# Patient Record
Sex: Female | Born: 1965 | Race: White | Hispanic: No | Marital: Married | Smoking: Never smoker
Health system: Southern US, Community
[De-identification: ages and names within clinical notes are randomized; demographics above are authoritative.]

## PROBLEM LIST (undated history)

## (undated) DIAGNOSIS — Z8489 Family history of other specified conditions: Secondary | ICD-10-CM

## (undated) DIAGNOSIS — M341 CR(E)ST syndrome: Secondary | ICD-10-CM

## (undated) DIAGNOSIS — R0600 Dyspnea, unspecified: Secondary | ICD-10-CM

## (undated) DIAGNOSIS — E041 Nontoxic single thyroid nodule: Secondary | ICD-10-CM

## (undated) DIAGNOSIS — K5900 Constipation, unspecified: Secondary | ICD-10-CM

## (undated) DIAGNOSIS — R1310 Dysphagia, unspecified: Secondary | ICD-10-CM

## (undated) DIAGNOSIS — K219 Gastro-esophageal reflux disease without esophagitis: Secondary | ICD-10-CM

## (undated) DIAGNOSIS — K589 Irritable bowel syndrome without diarrhea: Secondary | ICD-10-CM

## (undated) DIAGNOSIS — M349 Systemic sclerosis, unspecified: Secondary | ICD-10-CM

## (undated) DIAGNOSIS — O24420 Gestational diabetes mellitus in childbirth, diet controlled: Secondary | ICD-10-CM

## (undated) DIAGNOSIS — M549 Dorsalgia, unspecified: Secondary | ICD-10-CM

## (undated) DIAGNOSIS — E559 Vitamin D deficiency, unspecified: Secondary | ICD-10-CM

## (undated) DIAGNOSIS — IMO0001 Reserved for inherently not codable concepts without codable children: Secondary | ICD-10-CM

## (undated) DIAGNOSIS — R600 Localized edema: Secondary | ICD-10-CM

## (undated) DIAGNOSIS — J8410 Pulmonary fibrosis, unspecified: Secondary | ICD-10-CM

## (undated) DIAGNOSIS — Z973 Presence of spectacles and contact lenses: Secondary | ICD-10-CM

## (undated) DIAGNOSIS — D649 Anemia, unspecified: Secondary | ICD-10-CM

## (undated) DIAGNOSIS — I2721 Secondary pulmonary arterial hypertension: Secondary | ICD-10-CM

## (undated) DIAGNOSIS — M1990 Unspecified osteoarthritis, unspecified site: Secondary | ICD-10-CM

## (undated) DIAGNOSIS — F419 Anxiety disorder, unspecified: Secondary | ICD-10-CM

## (undated) DIAGNOSIS — I7300 Raynaud's syndrome without gangrene: Secondary | ICD-10-CM

## (undated) DIAGNOSIS — G4730 Sleep apnea, unspecified: Secondary | ICD-10-CM

## (undated) DIAGNOSIS — K829 Disease of gallbladder, unspecified: Secondary | ICD-10-CM

## (undated) DIAGNOSIS — Z780 Asymptomatic menopausal state: Secondary | ICD-10-CM

## (undated) DIAGNOSIS — M2550 Pain in unspecified joint: Secondary | ICD-10-CM

## (undated) HISTORY — DX: Dyspnea, unspecified: R06.00

## (undated) HISTORY — PX: TUBAL LIGATION: SHX77

## (undated) HISTORY — DX: Dorsalgia, unspecified: M54.9

## (undated) HISTORY — DX: Nontoxic single thyroid nodule: E04.1

## (undated) HISTORY — DX: Constipation, unspecified: K59.00

## (undated) HISTORY — DX: Systemic sclerosis, unspecified: M34.9

## (undated) HISTORY — DX: Pain in unspecified joint: M25.50

## (undated) HISTORY — DX: Localized edema: R60.0

## (undated) HISTORY — DX: Cr(e)st syndrome: M34.1

## (undated) HISTORY — DX: Anxiety disorder, unspecified: F41.9

## (undated) HISTORY — DX: Secondary pulmonary arterial hypertension: I27.21

## (undated) HISTORY — DX: Vitamin D deficiency, unspecified: E55.9

## (undated) HISTORY — PX: COLONOSCOPY: SHX174

## (undated) HISTORY — DX: Disease of gallbladder, unspecified: K82.9

## (undated) HISTORY — DX: Dysphagia, unspecified: R13.10

## (undated) HISTORY — DX: Pulmonary fibrosis, unspecified: J84.10

## (undated) HISTORY — DX: Unspecified osteoarthritis, unspecified site: M19.90

## (undated) HISTORY — DX: Asymptomatic menopausal state: Z78.0

## (undated) HISTORY — DX: Irritable bowel syndrome, unspecified: K58.9

## (undated) HISTORY — DX: Sleep apnea, unspecified: G47.30

---

## 1985-12-24 DIAGNOSIS — O24420 Gestational diabetes mellitus in childbirth, diet controlled: Secondary | ICD-10-CM

## 1985-12-24 HISTORY — DX: Gestational diabetes mellitus in childbirth, diet controlled: O24.420

## 1994-12-24 HISTORY — PX: CHOLECYSTECTOMY: SHX55

## 1999-09-22 ENCOUNTER — Other Ambulatory Visit: Admission: RE | Admit: 1999-09-22 | Discharge: 1999-09-22 | Payer: Self-pay | Admitting: Obstetrics and Gynecology

## 2000-10-15 ENCOUNTER — Other Ambulatory Visit: Admission: RE | Admit: 2000-10-15 | Discharge: 2000-10-15 | Payer: Self-pay | Admitting: Obstetrics and Gynecology

## 2001-02-03 ENCOUNTER — Encounter: Admission: RE | Admit: 2001-02-03 | Discharge: 2001-02-03 | Payer: Self-pay | Admitting: Gastroenterology

## 2001-02-03 ENCOUNTER — Encounter: Payer: Self-pay | Admitting: Gastroenterology

## 2001-11-04 ENCOUNTER — Other Ambulatory Visit: Admission: RE | Admit: 2001-11-04 | Discharge: 2001-11-04 | Payer: Self-pay | Admitting: Obstetrics and Gynecology

## 2002-11-27 ENCOUNTER — Other Ambulatory Visit: Admission: RE | Admit: 2002-11-27 | Discharge: 2002-11-27 | Payer: Self-pay | Admitting: Obstetrics and Gynecology

## 2004-02-02 ENCOUNTER — Other Ambulatory Visit: Admission: RE | Admit: 2004-02-02 | Discharge: 2004-02-02 | Payer: Self-pay | Admitting: Obstetrics and Gynecology

## 2005-04-13 ENCOUNTER — Other Ambulatory Visit: Admission: RE | Admit: 2005-04-13 | Discharge: 2005-04-13 | Payer: Self-pay | Admitting: Obstetrics and Gynecology

## 2008-12-24 HISTORY — PX: VAGINAL HYSTERECTOMY: SUR661

## 2009-11-09 ENCOUNTER — Encounter (INDEPENDENT_AMBULATORY_CARE_PROVIDER_SITE_OTHER): Payer: Self-pay | Admitting: Obstetrics and Gynecology

## 2009-11-09 ENCOUNTER — Ambulatory Visit (HOSPITAL_COMMUNITY): Admission: RE | Admit: 2009-11-09 | Discharge: 2009-11-10 | Payer: Self-pay | Admitting: Obstetrics and Gynecology

## 2011-03-28 LAB — CBC
HCT: 32.8 % — ABNORMAL LOW (ref 36.0–46.0)
HCT: 38.8 % (ref 36.0–46.0)
Hemoglobin: 11.2 g/dL — ABNORMAL LOW (ref 12.0–15.0)
Hemoglobin: 13.2 g/dL (ref 12.0–15.0)
MCHC: 34 g/dL (ref 30.0–36.0)
MCHC: 34 g/dL (ref 30.0–36.0)
MCV: 86.7 fL (ref 78.0–100.0)
MCV: 87.1 fL (ref 78.0–100.0)
Platelets: 304 10*3/uL (ref 150–400)
Platelets: 308 10*3/uL (ref 150–400)
RBC: 3.77 MIL/uL — ABNORMAL LOW (ref 3.87–5.11)
RBC: 4.48 MIL/uL (ref 3.87–5.11)
RDW: 13 % (ref 11.5–15.5)
RDW: 13.1 % (ref 11.5–15.5)
WBC: 13.4 10*3/uL — ABNORMAL HIGH (ref 4.0–10.5)
WBC: 7.7 10*3/uL (ref 4.0–10.5)

## 2011-03-28 LAB — HCG, SERUM, QUALITATIVE: Preg, Serum: NEGATIVE

## 2014-07-25 ENCOUNTER — Emergency Department (HOSPITAL_BASED_OUTPATIENT_CLINIC_OR_DEPARTMENT_OTHER)
Admission: EM | Admit: 2014-07-25 | Discharge: 2014-07-25 | Disposition: A | Discharge status: Home / Self Care | Payer: BC Managed Care – PPO | Attending: Emergency Medicine | Admitting: Emergency Medicine

## 2014-07-25 ENCOUNTER — Encounter (HOSPITAL_BASED_OUTPATIENT_CLINIC_OR_DEPARTMENT_OTHER): Payer: Self-pay | Admitting: Emergency Medicine

## 2014-07-25 ENCOUNTER — Emergency Department (HOSPITAL_BASED_OUTPATIENT_CLINIC_OR_DEPARTMENT_OTHER): Payer: BC Managed Care – PPO

## 2014-07-25 DIAGNOSIS — Y929 Unspecified place or not applicable: Secondary | ICD-10-CM | POA: Insufficient documentation

## 2014-07-25 DIAGNOSIS — R296 Repeated falls: Secondary | ICD-10-CM | POA: Insufficient documentation

## 2014-07-25 DIAGNOSIS — Z79899 Other long term (current) drug therapy: Secondary | ICD-10-CM | POA: Insufficient documentation

## 2014-07-25 DIAGNOSIS — K219 Gastro-esophageal reflux disease without esophagitis: Secondary | ICD-10-CM | POA: Insufficient documentation

## 2014-07-25 DIAGNOSIS — S52123A Displaced fracture of head of unspecified radius, initial encounter for closed fracture: Secondary | ICD-10-CM | POA: Insufficient documentation

## 2014-07-25 DIAGNOSIS — S6990XA Unspecified injury of unspecified wrist, hand and finger(s), initial encounter: Secondary | ICD-10-CM | POA: Insufficient documentation

## 2014-07-25 DIAGNOSIS — S52121A Displaced fracture of head of right radius, initial encounter for closed fracture: Secondary | ICD-10-CM

## 2014-07-25 DIAGNOSIS — Y9389 Activity, other specified: Secondary | ICD-10-CM | POA: Insufficient documentation

## 2014-07-25 HISTORY — DX: Gastro-esophageal reflux disease without esophagitis: K21.9

## 2014-07-25 MED ORDER — IBUPROFEN 400 MG PO TABS
400.0000 mg | ORAL_TABLET | Freq: Once | ORAL | Status: AC
  Administered 2014-07-25: 400 mg via ORAL
  Filled 2014-07-25: qty 1

## 2014-07-25 MED ORDER — DOCUSATE SODIUM 100 MG PO CAPS: 100.0000 mg | ORAL_CAPSULE | Freq: Two times a day (BID) | ORAL | Status: DC | PRN

## 2014-07-25 MED ORDER — OXYCODONE-ACETAMINOPHEN 5-325 MG PO TABS
2.0000 | ORAL_TABLET | Freq: Once | ORAL | Status: AC
  Administered 2014-07-25: 2 via ORAL
  Filled 2014-07-25: qty 2

## 2014-07-25 MED ORDER — OXYCODONE-ACETAMINOPHEN 5-325 MG PO TABS: 1.0000 | ORAL_TABLET | ORAL | Status: DC | PRN

## 2014-07-25 NOTE — Discharge Instructions (Signed)
Radial Head Fracture °A radial head fracture is a break of the smaller bone (radius) in the forearm. The head of this bone is the part near the elbow. These fractures commonly happen during a fall, when you land on an outstretched arm. These fractures are more common in middle aged adults and are common with a dislocation of the elbow. °SYMPTOMS  °· Swelling of the elbow joint and pain on the outside of the elbow. °· Pain and difficulty in bending or straightening the elbow. °· Pain and difficulty in turning the palm of the hand up or down with the elbow bent. °DIAGNOSIS  °Your caregiver may make this diagnosis by a physical exam. X-rays can confirm the type and amount of fracture. Sometimes a fracture that is not displaced cannot be seen on the original X-ray. °TREATMENT  °Radial head fractures are classified according to the amount of movement (displacement) of parts from the normal position.  °Type 1 Fractures °· Type 1 fractures are generally small fractures in which bone pieces remain together (nondisplaced fracture). °· The fracture may not be seen on initial X-rays. Usually if X-rays are repeated two to three weeks later, the fracture will show up. A splint or sling is used for a few days. Gentle early motion is used to prevent the elbow from becoming stiff. It should not be done vigorously or forced as this could displace the bone pieces. °Type 2 Fractures °· With type 2 fractures, bone pieces are slightly displaced and larger pieces of bone are broken off. °· If only a little displacement of the bone piece is present, splinting for 4 to 5 days usually works well. This is again followed with gentle active range of motion. Small fragments may be surgically removed. °· Large pieces of bone that can be put back into place will sometimes be fixed with pins or screws to hold them until the bone is healed. If this cannot be done, the fragments are removed. For older, less active people, sometimes the entire radial  head is removed if the wrist is not injured. The elbow and arm will still work fine. Soft tissue, tendon, and ligament injuries are corrected at the same time. °Type 3 Fractures °· Type 3 fractures have multiple broken pieces of bone that cannot be fixed. Surgery is usually needed to remove the broken bits of bone and what is left of the radial head. Soft-tissue damage is repaired. Gentle early motion is used to prevent the elbow from becoming stiff. Sometimes an artificial radial head can be used to prevent deformity if the elbow is unstable. °Rest, ice, elevation, immobilization, medications, and pain control are used in the early care. °HOME CARE INSTRUCTIONS  °· Keep the injured part elevated while sitting or lying down. Keep the injury above the level of your heart (the center of the chest). This will decrease swelling and pain. °· Apply ice to the injury for 15-20 minutes, 03-04 times per day while awake, for 2 days. Put the ice in a plastic bag and place a towel between the bag of ice and your cast or splint. °· Move your fingers to avoid stiffness and minimize swelling. °· If you have a plaster or fiberglass cast: °¨ Do not try to scratch the skin under the cast using sharp or pointed objects. °¨ Check the skin around the cast every day. You may put lotion on any red or sore areas. °¨ Keep your cast dry and clean. °· If you have a plaster splint: °¨   Wear the splint as directed. °¨ You may loosen the elastic around the splint if your fingers become numb, tingle, or turn cold or blue. °· Do not put pressure on any part of your cast or splint. It may break. Rest your cast only on a pillow for the first 24 hours until it is fully hardened. °· Your cast or splint can be protected during bathing with a plastic bag. Do not lower the cast or splint into the water. °· Only take over-the-counter or prescription medicines for pain, discomfort, or fever as directed by your caregiver. °· Follow all instructions for  follow-up with your caregiver. This includes any orthopedic referrals, physical therapy, and rehabilitation. Any delay in obtaining necessary care could result in a delay or failure of the bones to heal or permanent elbow stiffness. °· Do not overdo exercises. This could further damage your injury. °SEEK IMMEDIATE MEDICAL CARE IF:  °· Your cast or splint gets damaged or breaks. °· You have more severe pain or swelling than you did before getting the cast. °· You have severe pain when stretching your fingers. °· There is a bad smell, new stains, and/or pus-like (purulent) drainage coming from under the cast. °· Your fingers or hand turn pale or blue, become cold, or you lose feeling. °Document Released: 10/01/2006 Document Revised: 04/26/2014 Document Reviewed: 11/08/2009 °[employer]® Patient Information ©2015 [employer]. This information is not intended to replace advice given to you by your health care provider. Make sure you discuss any questions you have with your health care provider. ° °

## 2014-07-25 NOTE — ED Notes (Signed)
Pt reports fell about 30 min pta, states she attempted to catch herself with palms and now having pain in right elbow down to hand.

## 2014-07-25 NOTE — ED Provider Notes (Signed)
CSN: [id number]     Arrival date & time 07/25/14  1315 History  This chart was scribed for Raeford RazorStephen Kalecia Hartney, MD by Phillis HaggisGabriella Gaje, ED Scribe. This patient was seen in room MH02/MH02 and patient care was started at 3:14 PM.     Chief Complaint  Patient presents with  . Arm Injury   Patient is a 48 y.o. female presenting with arm injury. The history is provided by the patient.  Arm Injury  HPI Comments: Rachael Camacho is a 48 y.o. female who presents to the Emergency Department complaining of right arm injury onset 30 minutes ago. She states that she attempted to catch herself while falling down some stairs, landing on her palms. She reports associated radiating pain from her right elbow down to her hand. She denies shoulder pain, numbness or tingling. Patient reports allergies to [name], [name], and Sulfa antibiotics. She denies any other symptoms.    Past Medical History  Diagnosis Date  . GERD (gastroesophageal reflux disease)    Past Surgical History  Procedure Laterality Date  . Cesarean section    . Abdominal hysterectomy    . Cholecystectomy     No family history on file. History  Substance Use Topics  . Smoking status: Never Smoker   . Smokeless tobacco: Not on file  . Alcohol Use: Yes     Comment: occ   OB History   Grav Para Term Preterm Abortions TAB SAB Ect Mult Living                 Review of Systems  Musculoskeletal: Positive for arthralgias.  Neurological: Negative for weakness and numbness.  All other systems reviewed and are negative.     Allergies  [name]; [name]; and Sulfa antibiotics  Home Medications   Prior to Admission medications   Medication Sig Start Date End Date Taking? Authorizing Provider  esomeprazole (NEXIUM) 40 MG capsule Take 40 mg by mouth daily at 12 noon.   Yes Historical Provider, MD   BP 154/99  Pulse 70  Temp(Src) 97.9 F (36.6 C) (Oral)  Resp 16  Ht 5\' 4"  (1.626 m)  Wt 260 lb (117.935 kg)  BMI 44.61 kg/m2   SpO2 99% Physical Exam  Nursing note and vitals reviewed. Constitutional: She appears well-developed and well-nourished. No distress.  HENT:  Head: Normocephalic and atraumatic.  Eyes: Conjunctivae are normal. Right eye exhibits no discharge. Left eye exhibits no discharge.  Neck: Neck supple.  Cardiovascular: Normal rate, regular rhythm and normal heart sounds.  Exam reveals no gallop and no friction rub.   No murmur heard. Pulmonary/Chest: Effort normal and breath sounds normal. No respiratory distress.  Abdominal: Soft. She exhibits no distension. There is no tenderness.  Musculoskeletal: She exhibits no edema and no tenderness.  Exam limited by body habitus. Tenderness to radial head, pain with supination and pronation of forearm. Cannot actively range elbow secondary to pain. Skin intact. NVI distally.   Neurological: She is alert.  Skin: Skin is warm and dry.  Psychiatric: She has a normal mood and affect. Her behavior is normal. Thought content normal.    ED Course  Procedures (including critical care time) DIAGNOSTIC STUDIES: Oxygen Saturation is 98% on room air, normal by my interpretation.    COORDINATION OF CARE:    Labs Review Labs Reviewed - No data to display  Imaging Review Dg Forearm Right  07/25/2014   CLINICAL DATA:  Fall, right arm pain  EXAM: RIGHT FOREARM - 2 VIEW  COMPARISON:  Right wrist radiographs dictated separately  FINDINGS: There is a displaced radial head fracture with overlying soft tissue swelling. No evidence for dislocation. No radiopaque foreign body.  IMPRESSION: Right radial head fracture.   Electronically Signed   By: Christiana Pellant M.D.   On: 07/25/2014 15:15   Dg Wrist Complete Right  07/25/2014   CLINICAL DATA:  Fall, right arm pain  EXAM: RIGHT WRIST - COMPLETE 3+ VIEW  COMPARISON:  Please see report of forearm radiographs dictated separately today  FINDINGS: There is no evidence of fracture or dislocation. There is no evidence of  arthropathy or other focal bone abnormality. Soft tissues are unremarkable.  IMPRESSION: Negative. Please see report of forearm radiographs dictated separately today.   Electronically Signed   By: Christiana Pellant M.D.   On: 07/25/2014 15:16     EKG Interpretation None      MDM   Final diagnoses:  Closed fracture of radial head, right, initial encounter    48yF with R elbow pain. Imaging as above. Closed injury. NVI. Splint. Pain meds. Ortho FU.   I personally preformed the services scribed in my presence. The recorded information has been reviewed is accurate. Raeford Razor, MD.     Raeford Razor, MD 07/31/14 [phone]

## 2014-07-26 ENCOUNTER — Other Ambulatory Visit: Payer: Self-pay | Admitting: Orthopedic Surgery

## 2014-07-27 ENCOUNTER — Encounter (HOSPITAL_BASED_OUTPATIENT_CLINIC_OR_DEPARTMENT_OTHER): Payer: Self-pay | Admitting: *Deleted

## 2014-07-27 NOTE — Progress Notes (Signed)
No labs needed

## 2014-07-28 ENCOUNTER — Encounter (HOSPITAL_BASED_OUTPATIENT_CLINIC_OR_DEPARTMENT_OTHER): Payer: BC Managed Care – PPO | Admitting: Anesthesiology

## 2014-07-28 ENCOUNTER — Encounter (HOSPITAL_BASED_OUTPATIENT_CLINIC_OR_DEPARTMENT_OTHER): Payer: Self-pay | Admitting: Anesthesiology

## 2014-07-28 ENCOUNTER — Ambulatory Visit (HOSPITAL_BASED_OUTPATIENT_CLINIC_OR_DEPARTMENT_OTHER): Payer: BC Managed Care – PPO | Admitting: Anesthesiology

## 2014-07-28 ENCOUNTER — Ambulatory Visit (HOSPITAL_BASED_OUTPATIENT_CLINIC_OR_DEPARTMENT_OTHER)
Admission: RE | Admit: 2014-07-28 | Discharge: 2014-07-29 | Disposition: A | Discharge status: Home / Self Care | Payer: BC Managed Care – PPO | Source: Ambulatory Visit | Attending: Orthopedic Surgery | Admitting: Orthopedic Surgery

## 2014-07-28 ENCOUNTER — Encounter (HOSPITAL_BASED_OUTPATIENT_CLINIC_OR_DEPARTMENT_OTHER)
Admission: RE | Disposition: A | Discharge status: Home / Self Care | Payer: Self-pay | Source: Ambulatory Visit | Attending: Orthopedic Surgery

## 2014-07-28 DIAGNOSIS — S52123A Displaced fracture of head of unspecified radius, initial encounter for closed fracture: Secondary | ICD-10-CM | POA: Insufficient documentation

## 2014-07-28 DIAGNOSIS — X58XXXA Exposure to other specified factors, initial encounter: Secondary | ICD-10-CM | POA: Insufficient documentation

## 2014-07-28 DIAGNOSIS — Z881 Allergy status to other antibiotic agents status: Secondary | ICD-10-CM | POA: Insufficient documentation

## 2014-07-28 DIAGNOSIS — Z885 Allergy status to narcotic agent status: Secondary | ICD-10-CM | POA: Insufficient documentation

## 2014-07-28 DIAGNOSIS — I7300 Raynaud's syndrome without gangrene: Secondary | ICD-10-CM | POA: Insufficient documentation

## 2014-07-28 DIAGNOSIS — M129 Arthropathy, unspecified: Secondary | ICD-10-CM | POA: Insufficient documentation

## 2014-07-28 DIAGNOSIS — Z6841 Body Mass Index (BMI) 40.0 and over, adult: Secondary | ICD-10-CM | POA: Insufficient documentation

## 2014-07-28 DIAGNOSIS — S52121A Displaced fracture of head of right radius, initial encounter for closed fracture: Secondary | ICD-10-CM

## 2014-07-28 DIAGNOSIS — K219 Gastro-esophageal reflux disease without esophagitis: Secondary | ICD-10-CM | POA: Insufficient documentation

## 2014-07-28 DIAGNOSIS — Z882 Allergy status to sulfonamides status: Secondary | ICD-10-CM | POA: Insufficient documentation

## 2014-07-28 HISTORY — DX: Raynaud's syndrome without gangrene: I73.00

## 2014-07-28 HISTORY — PX: RADIAL HEAD ARTHROPLASTY: SHX6044

## 2014-07-28 HISTORY — DX: Unspecified osteoarthritis, unspecified site: M19.90

## 2014-07-28 HISTORY — DX: Presence of spectacles and contact lenses: Z97.3

## 2014-07-28 LAB — POCT HEMOGLOBIN-HEMACUE: Hemoglobin: 14.2 g/dL (ref 12.0–15.0)

## 2014-07-28 SURGERY — ARTHROPLASTY, RADIUS, HEAD
Anesthesia: Regional | Laterality: Right

## 2014-07-28 MED ORDER — LIDOCAINE HCL (CARDIAC) 20 MG/ML IV SOLN
INTRAVENOUS | Status: DC | PRN
  Administered 2014-07-28: 40 mg via INTRAVENOUS

## 2014-07-28 MED ORDER — MIDAZOLAM HCL 2 MG/2ML IJ SOLN
INTRAMUSCULAR | Status: AC
  Filled 2014-07-28: qty 2

## 2014-07-28 MED ORDER — CEFAZOLIN SODIUM-DEXTROSE 2-3 GM-% IV SOLR
2.0000 g | Freq: Four times a day (QID) | INTRAVENOUS | Status: AC
  Administered 2014-07-28 – 2014-07-29 (×3): 2 g via INTRAVENOUS

## 2014-07-28 MED ORDER — ONDANSETRON HCL 4 MG PO TABS: 4.0000 mg | ORAL_TABLET | Freq: Four times a day (QID) | ORAL | Status: DC | PRN

## 2014-07-28 MED ORDER — DEXAMETHASONE SODIUM PHOSPHATE 10 MG/ML IJ SOLN
INTRAMUSCULAR | Status: DC | PRN
  Administered 2014-07-28: 10 mg via INTRAVENOUS

## 2014-07-28 MED ORDER — CEFAZOLIN SODIUM-DEXTROSE 2-3 GM-% IV SOLR
2.0000 g | INTRAVENOUS | Status: AC
  Administered 2014-07-28: 2 g via INTRAVENOUS

## 2014-07-28 MED ORDER — BUPIVACAINE HCL (PF) 0.5 % IJ SOLN
INTRAMUSCULAR | Status: DC | PRN
  Administered 2014-07-28: 8 mL

## 2014-07-28 MED ORDER — LACTATED RINGERS IV SOLN
INTRAVENOUS | Status: DC
  Administered 2014-07-28 (×2): via INTRAVENOUS

## 2014-07-28 MED ORDER — FENTANYL CITRATE 0.05 MG/ML IJ SOLN
INTRAMUSCULAR | Status: AC
  Filled 2014-07-28: qty 6

## 2014-07-28 MED ORDER — CHLORHEXIDINE GLUCONATE 4 % EX LIQD: 60.0000 mL | Freq: Once | CUTANEOUS | Status: DC

## 2014-07-28 MED ORDER — FENTANYL CITRATE 0.05 MG/ML IJ SOLN
50.0000 ug | INTRAMUSCULAR | Status: DC | PRN
  Administered 2014-07-28: 100 ug via INTRAVENOUS

## 2014-07-28 MED ORDER — OXYCODONE-ACETAMINOPHEN 5-325 MG PO TABS: 1.0000 | ORAL_TABLET | ORAL | Status: DC | PRN

## 2014-07-28 MED ORDER — MIDAZOLAM HCL 5 MG/5ML IJ SOLN
INTRAMUSCULAR | Status: DC | PRN
  Administered 2014-07-28: 2 mg via INTRAVENOUS

## 2014-07-28 MED ORDER — BUPIVACAINE-EPINEPHRINE (PF) 0.5% -1:200000 IJ SOLN
INTRAMUSCULAR | Status: DC | PRN
  Administered 2014-07-28: 30 mL via PERINEURAL

## 2014-07-28 MED ORDER — FENTANYL CITRATE 0.05 MG/ML IJ SOLN
INTRAMUSCULAR | Status: AC
  Filled 2014-07-28: qty 2

## 2014-07-28 MED ORDER — DIPHENHYDRAMINE HCL 25 MG PO CAPS
25.0000 mg | ORAL_CAPSULE | Freq: Four times a day (QID) | ORAL | Status: DC | PRN
  Administered 2014-07-28: 25 mg via ORAL
  Filled 2014-07-28: qty 1

## 2014-07-28 MED ORDER — BUPIVACAINE HCL (PF) 0.25 % IJ SOLN
INTRAMUSCULAR | Status: AC
  Filled 2014-07-28: qty 60

## 2014-07-28 MED ORDER — ONDANSETRON HCL 4 MG/2ML IJ SOLN
INTRAMUSCULAR | Status: DC | PRN
  Administered 2014-07-28: 4 mg via INTRAVENOUS

## 2014-07-28 MED ORDER — METOCLOPRAMIDE HCL 5 MG/ML IJ SOLN: 5.0000 mg | Freq: Three times a day (TID) | INTRAMUSCULAR | Status: DC | PRN

## 2014-07-28 MED ORDER — METOCLOPRAMIDE HCL 5 MG PO TABS: 5.0000 mg | ORAL_TABLET | Freq: Three times a day (TID) | ORAL | Status: DC | PRN

## 2014-07-28 MED ORDER — PROPOFOL 10 MG/ML IV BOLUS
INTRAVENOUS | Status: DC | PRN
  Administered 2014-07-28: 200 mg via INTRAVENOUS

## 2014-07-28 MED ORDER — CEFAZOLIN SODIUM-DEXTROSE 2-3 GM-% IV SOLR
INTRAVENOUS | Status: AC
  Filled 2014-07-28: qty 50

## 2014-07-28 MED ORDER — HYDROMORPHONE HCL PF 1 MG/ML IJ SOLN: 0.2500 mg | INTRAMUSCULAR | Status: DC | PRN

## 2014-07-28 MED ORDER — DOCUSATE SODIUM 100 MG PO CAPS
100.0000 mg | ORAL_CAPSULE | Freq: Two times a day (BID) | ORAL | Status: DC
  Administered 2014-07-28: 100 mg via ORAL
  Filled 2014-07-28: qty 1

## 2014-07-28 MED ORDER — PANTOPRAZOLE SODIUM 40 MG PO TBEC
40.0000 mg | DELAYED_RELEASE_TABLET | Freq: Every day | ORAL | Status: DC
  Administered 2014-07-28: 40 mg via ORAL
  Filled 2014-07-28: qty 1

## 2014-07-28 MED ORDER — MIDAZOLAM HCL 2 MG/2ML IJ SOLN
1.0000 mg | INTRAMUSCULAR | Status: DC | PRN
  Administered 2014-07-28: 2 mg via INTRAVENOUS

## 2014-07-28 MED ORDER — DEXTROSE-NACL 5-0.9 % IV SOLN
INTRAVENOUS | Status: DC
  Administered 2014-07-28: 18:00:00 via INTRAVENOUS

## 2014-07-28 MED ORDER — HYDROCODONE-ACETAMINOPHEN 5-325 MG PO TABS
1.0000 | ORAL_TABLET | ORAL | Status: DC | PRN
  Administered 2014-07-28 – 2014-07-29 (×2): 2 via ORAL
  Filled 2014-07-28 (×2): qty 2

## 2014-07-28 MED ORDER — ONDANSETRON HCL 4 MG/2ML IJ SOLN: 4.0000 mg | Freq: Four times a day (QID) | INTRAMUSCULAR | Status: DC | PRN

## 2014-07-28 MED ORDER — PROPOFOL INFUSION 10 MG/ML OPTIME
INTRAVENOUS | Status: DC | PRN
  Administered 2014-07-28: 75 ug/kg/min via INTRAVENOUS

## 2014-07-28 MED ORDER — HYDROMORPHONE HCL PF 1 MG/ML IJ SOLN
0.5000 mg | INTRAMUSCULAR | Status: DC | PRN
  Administered 2014-07-28 (×2): 0.5 mg via INTRAVENOUS
  Filled 2014-07-28: qty 1

## 2014-07-28 SURGICAL SUPPLY — 73 items
BAG DECANTER FOR FLEXI CONT (MISCELLANEOUS) IMPLANT
BANDAGE ELASTIC 3 VELCRO ST LF (GAUZE/BANDAGES/DRESSINGS) ×2 IMPLANT
BANDAGE ELASTIC 4 VELCRO ST LF (GAUZE/BANDAGES/DRESSINGS) IMPLANT
BLADE AVERAGE 25X9 (BLADE) ×2 IMPLANT
BLADE MINI RND TIP GREEN BEAV (BLADE) IMPLANT
BLADE SURG 15 STRL LF DISP TIS (BLADE) ×1 IMPLANT
BLADE SURG 15 STRL SS (BLADE) ×2
BNDG CMPR 9X4 STRL LF SNTH (GAUZE/BANDAGES/DRESSINGS) ×1
BNDG ESMARK 4X9 LF (GAUZE/BANDAGES/DRESSINGS) ×2 IMPLANT
BNDG GAUZE ELAST 4 BULKY (GAUZE/BANDAGES/DRESSINGS) IMPLANT
CANISTER SUCT 1200ML W/VALVE (MISCELLANEOUS) ×1 IMPLANT
CORDS BIPOLAR (ELECTRODE) ×2 IMPLANT
COVER TABLE BACK 60X90 (DRAPES) ×2 IMPLANT
CUFF TOURNIQUET SINGLE 18IN (TOURNIQUET CUFF) IMPLANT
CUFF TOURNIQUET SINGLE 24IN (TOURNIQUET CUFF) ×1 IMPLANT
DECANTER SPIKE VIAL GLASS SM (MISCELLANEOUS) IMPLANT
DRAPE EXTREMITY T 121X128X90 (DRAPE) ×2 IMPLANT
DRAPE OEC MINIVIEW 54X84 (DRAPES) ×2 IMPLANT
DRAPE SURG 17X23 STRL (DRAPES) ×2 IMPLANT
DRSG KUZMA FLUFF (GAUZE/BANDAGES/DRESSINGS) IMPLANT
DURAPREP 26ML APPLICATOR (WOUND CARE) ×2 IMPLANT
ELECT REM PT RETURN 9FT ADLT (ELECTROSURGICAL)
ELECTRODE REM PT RTRN 9FT ADLT (ELECTROSURGICAL) IMPLANT
GAUZE SPONGE 4X4 12PLY STRL (GAUZE/BANDAGES/DRESSINGS) ×2 IMPLANT
GAUZE SPONGE 4X4 16PLY XRAY LF (GAUZE/BANDAGES/DRESSINGS) IMPLANT
GAUZE XEROFORM 1X8 LF (GAUZE/BANDAGES/DRESSINGS) IMPLANT
GLOVE BIOGEL PI IND STRL 8.5 (GLOVE) IMPLANT
GLOVE BIOGEL PI INDICATOR 8.5 (GLOVE)
GLOVE SURG ORTHO 8.0 STRL STRW (GLOVE) IMPLANT
GLOVE SURG SYN 8.0 (GLOVE) ×4 IMPLANT
GLOVE SURG SYN 8.0 PF PI (GLOVE) ×2 IMPLANT
GOWN STRL REUS W/ TWL LRG LVL3 (GOWN DISPOSABLE) ×1 IMPLANT
GOWN STRL REUS W/TWL LRG LVL3 (GOWN DISPOSABLE) ×2
GOWN STRL REUS W/TWL XL LVL3 (GOWN DISPOSABLE) ×4 IMPLANT
HEAD RADIAL 10X20 (Head) ×1 IMPLANT
NDL 1/2 CIR CATGUT .05X1.09 (NEEDLE) IMPLANT
NDL HYPO 25X1 1.5 SAFETY (NEEDLE) ×1 IMPLANT
NEEDLE 1/2 CIR CATGUT .05X1.09 (NEEDLE) IMPLANT
NEEDLE HYPO 25X1 1.5 SAFETY (NEEDLE) IMPLANT
NS IRRIG 1000ML POUR BTL (IV SOLUTION) ×2 IMPLANT
PACK BASIN DAY SURGERY FS (CUSTOM PROCEDURE TRAY) ×2 IMPLANT
PAD CAST 3X4 CTTN HI CHSV (CAST SUPPLIES) ×1 IMPLANT
PAD CAST 4YDX4 CTTN HI CHSV (CAST SUPPLIES) IMPLANT
PADDING CAST ABS 4INX4YD NS (CAST SUPPLIES) ×1
PADDING CAST ABS COTTON 4X4 ST (CAST SUPPLIES) ×1 IMPLANT
PADDING CAST COTTON 3X4 STRL (CAST SUPPLIES) ×2
PADDING CAST COTTON 4X4 STRL (CAST SUPPLIES)
PADDING UNDERCAST 2 STRL (CAST SUPPLIES)
PADDING UNDERCAST 2X4 STRL (CAST SUPPLIES) IMPLANT
PENCIL BUTTON HOLSTER BLD 10FT (ELECTRODE) IMPLANT
SHEET MEDIUM DRAPE 40X70 STRL (DRAPES) ×2 IMPLANT
SPLINT PLASTER CAST XFAST 4X15 (CAST SUPPLIES) ×5 IMPLANT
SPLINT PLASTER XTRA FAST SET 4 (CAST SUPPLIES) ×15
STAPLER VISISTAT (STAPLE) IMPLANT
STEM IMPLANT W SCREW (Stem) ×1 IMPLANT
STOCKINETTE 4X48 STRL (DRAPES) ×2 IMPLANT
SUCTION FRAZIER TIP 10 FR DISP (SUCTIONS) ×1 IMPLANT
SUT ETHILON 4 0 PS 2 18 (SUTURE) IMPLANT
SUT MERSILENE 4 0 P 3 (SUTURE) IMPLANT
SUT PROLENE 3 0 PS 2 (SUTURE) ×1 IMPLANT
SUT SILK 2 0 FS (SUTURE) IMPLANT
SUT VIC AB 0 CT1 27 (SUTURE) ×2
SUT VIC AB 0 CT1 27XBRD ANBCTR (SUTURE) IMPLANT
SUT VIC AB 2-0 SH 27 (SUTURE) ×4
SUT VIC AB 2-0 SH 27XBRD (SUTURE) IMPLANT
SUT VIC AB 3-0 FS2 27 (SUTURE) IMPLANT
SUT VICRYL RAPIDE 4-0 (SUTURE) IMPLANT
SUT VICRYL RAPIDE 4/0 PS 2 (SUTURE) IMPLANT
SYR BULB 3OZ (MISCELLANEOUS) ×2 IMPLANT
SYRINGE 10CC LL (SYRINGE) ×1 IMPLANT
TOWEL OR 17X24 6PK STRL BLUE (TOWEL DISPOSABLE) ×3 IMPLANT
TUBE CONNECTING 20X1/4 (TUBING) IMPLANT
UNDERPAD 30X30 INCONTINENT (UNDERPADS AND DIAPERS) ×2 IMPLANT

## 2014-07-28 NOTE — Anesthesia Postprocedure Evaluation (Signed)
  Anesthesia Post-op Note  Patient: Rachael Camacho  Procedure(s) Performed: Procedure(s): RIGHT RADIAL HEAD REPLACEMENT (Right)  Patient Location: PACU  Anesthesia Type:General and block  Level of Consciousness: awake and alert   Airway and Oxygen Therapy: Patient Spontanous Breathing  Post-op Pain: none  Post-op Assessment: Post-op Vital signs reviewed, Patient's Cardiovascular Status Stable and Respiratory Function Stable  Post-op Vital Signs: Reviewed  Filed Vitals:   07/28/14 1515  BP: 124/84  Pulse: 75  Temp:   Resp: 20    Complications: No apparent anesthesia complications

## 2014-07-28 NOTE — Progress Notes (Signed)
Assisted Dr. Fitzgerald with right, ultrasound guided, supraclavicular block. Side rails up, monitors on throughout procedure. See vital signs in flow sheet. Tolerated Procedure well. 

## 2014-07-28 NOTE — Anesthesia Procedure Notes (Addendum)
Anesthesia Regional Block:  Supraclavicular block  Pre-Anesthetic Checklist: ,, timeout performed, Correct Patient, Correct Site, Correct Laterality, Correct Procedure, Correct Position, site marked, Risks and benefits discussed, pre-op evaluation, post-op pain management  Laterality: Right  Prep: Maximum Sterile Barrier Precautions used and chloraprep       Needles:  Injection technique: Single-shot  Needle Type: Echogenic Stimulator Needle     Needle Length: 5cm 5 cm Needle Gauge: 22 and 22 G    Additional Needles:  Procedures: ultrasound guided (picture in chart) Supraclavicular block Narrative:  Start time: 07/28/2014 11:33 AM End time: 07/28/2014 11:45 AM Injection made incrementally with aspirations every 5 mL. Anesthesiologist: Fitzgerald,MD  Additional Notes: 2% Lidocaine skin wheel. Intercostobrachial block with 8cc of 0.5% Bupivicaine plain.   Procedure Name: LMA Insertion Date/Time: 07/28/2014 12:35 PM Performed by: Genevieve NorlanderLINKA, Cosima Prentiss L Pre-anesthesia Checklist: Patient identified, Emergency Drugs available, Suction available, Patient being monitored and Timeout performed Patient Re-evaluated:Patient Re-evaluated prior to inductionOxygen Delivery Method: Circle System Utilized Preoxygenation: Pre-oxygenation with 100% oxygen Intubation Type: IV induction Ventilation: Mask ventilation without difficulty LMA: LMA inserted LMA Size: 4.0 Number of attempts: 1 Airway Equipment and Method: bite block Placement Confirmation: positive ETCO2 and breath sounds checked- equal and bilateral Tube secured with: Tape Dental Injury: Teeth and Oropharynx as per pre-operative assessment      Supraclavicular block

## 2014-07-28 NOTE — Op Note (Signed)
See note [phone]

## 2014-07-28 NOTE — H&P (Signed)
Rachael Camacho is an 48 y.o. female.   Chief Complaint: right elbow pain HPI: as above s/p fall with displaced right radial head fracture  Past Medical History  Diagnosis Date  . GERD (gastroesophageal reflux disease)   . Arthritis   . Raynaud disease     hands-toes  . Wears glasses     Past Surgical History  Procedure Laterality Date  . Cesarean section  [phone]  . Cholecystectomy  1996    lap choli  . Tubal ligation    . Vaginal hysterectomy  2010    [facility]    History reviewed. No pertinent family history. Social History:  reports that she has never smoked. She does not have any smokeless tobacco history on file. She reports that she drinks alcohol. She reports that she does not use illicit drugs.  Allergies:  Allergies  Allergen Reactions  . Percocet [Oxycodone-Acetaminophen] Itching  . [name] [Nitrofurantoin Monohyd Macro] Rash  . [name] Rash    Reported chicken pox like rash, not hives  . Sulfa Antibiotics Rash    No prescriptions prior to admission    No results found for this or any previous visit (from the past 48 hour(s)). No results found.  Review of Systems  All other systems reviewed and are negative.   Height 5\' 4"  (1.626 m), weight 117.935 kg (260 lb). Physical Exam  Constitutional: She is oriented to person, place, and time. She appears well-developed and well-nourished.  HENT:  Head: Normocephalic and atraumatic.  Cardiovascular: Normal rate.   Respiratory: Effort normal.  Musculoskeletal:       Right elbow: She exhibits swelling. Tenderness found. Radial head tenderness noted.  Displaced right radial head fracture  Neurological: She is alert and oriented to person, place, and time.  Skin: Skin is warm.  Psychiatric: She has a normal mood and affect. Her behavior is normal. Judgment and thought content normal.     Assessment/Plan As above   Plan right radial head replacement  Gil Ingwersen A 07/28/2014, 10:10 AM

## 2014-07-28 NOTE — H&P (Signed)
NAME:  Rachael Camacho, Rachael Camacho               ACCOUNT NO.:  [id number]  MEDICAL RECORD NO.:  [id number]  LOCATION:                               FACILITY:  [facility]  PHYSICIAN:  Dulcie Gammon A. Anthoni Geerts, M.D.DATE OF BIRTH:  05/12/1966  DATE OF ADMISSION:  07/28/2014 DATE OF DISCHARGE:  07/29/2014                             HISTORY & PHYSICAL   PREOPERATIVE DIAGNOSIS:  Displaced radial head fracture, right elbow.  POSTOPERATIVE DIAGNOSIS:  Displaced radial head fracture, right elbow.  PROCEDURE:  Radial head replacement, right elbow.  SURGEON:  Princessa Lesmeister A. Naziya Hegwood, M.D.  ASSISTANT:  Robert J. Dasnoit, P.A.  ANESTHESIA:  Supraclavicular block and general.  COMPLICATIONS:  None.  DRAINS:  None.  DESCRIPTION OF PROCEDURE:  The patient was taken to the operating suite. After the induction of adequate supraclavicular block analgesia and then general laryngeal mask airway anesthetic, the right upper extremity was prepped and draped in usual sterile fashion.  An Esmarch was used to exsanguinate the limb.  Tourniquet was inflated to 275 mmHg.  At this point in time, we made an incision centered between the lateral epicondyle and olecranon process in Kocher fashion.  Skin was incised. Dissection was carried down in the interval between the extensor digitorum communis and the extensor carpi radialis longus.  This interval was split down at the radial capitellar joint.  We palpated the radial capitellar joint, split the muscle and entered the joint.  There was a 3-part radial head fracture, the parts were removed with no undue difficulty, placed on the back table for later sizing the radial head implant.  We then used a sagittal saw to make a neck cut followed by placement of a #6 stem and then #10 head trial, this was deemed to be adequate placement and sizing for this implant.  We thoroughly irrigated out all osseous debris.  We then placed the Biomet radial head implant, the ExploR 6 mm x 24 mm stem  and a 10 x 20 mm implant head with no undue difficulty.  We irrigated.  Stability was confirmed radiographically. We then closed in layers of 0 Vicryl for the annular ligament and deeper tissues and muscle layer 2-0 undyed Vicryl subcutaneously and 3-0 Prolene subcuticular stitch on the skin.  Steri-Strips, 4x4s, fluffs, and a splint were applied with the forearm in neutral rotation. Elbow flexed to 90 degrees.  The patient tolerated the procedure well, went to the recovery room in stable fashion.     Jaret Coppedge A. Paulino Cork, M.D.     MAW/MEDQ  D:  07/28/2014  T:  07/28/2014  Job:  [id number] 

## 2014-07-28 NOTE — Op Note (Signed)
[name], [name]               ACCOUNT NO.:  [id number]  MEDICAL RECORD NO.:  [id number]  LOCATION:  [facility]                         FACILITY:  [facility]  PHYSICIAN:  Artist PaisMatthew A. Alyus Mofield, M.D.DATE OF BIRTH:  02/10/1966  DATE OF PROCEDURE:  07/28/2014 DATE OF DISCHARGE:  07/25/2014                              OPERATIVE REPORT   PREOPERATIVE DIAGNOSIS:  Comminuted right radial head fracture.  POSTOPERATIVE DIAGNOSIS:  Comminuted right radial head fracture.  PROCEDURE:  Right radial head replacement.  SURGEON:  Artist PaisMatthew A. Mina MarbleWeingold, MD  ASSISTANT:  Marveen Reeksobert J. Dasnoit, PA  ANESTHESIA:  Supraclavicular block and general.  COMPLICATION:  No complication.  DRAINS:  No drains.  PROCEDURE IN DETAIL:  The patient was taken to the operating suite. After induction of adequate supraclavicular block analgesia and then general laryngeal mask airway anesthetic, right upper extremity was prepped and draped in sterile fashion.  Esmarch was used to exsanguinate the limb.  Tourniquet was inflated to 275 mmHg.  At this point in time, we palpated the lateral aspect of the elbow centered between the tip of the olecranon process and lateral epicondyle.  We made a Kocher type incision to the skin, dissected down to the interval between ECRL and EDC, we could palpate the radiocapitellar joint.  We split the muscle and incised the capsule over the radiocapitellar joint.  There was a three-part radial head fracture, all 3 fragments were removed, placed on the back table for sizing of the radial head replacement.  Later, we thoroughly irrigated the wound out, osseous debris and clot was removed. Then using a sagittal saw, we made a neck cut.  Then using the Biomet radial head implant set, we found the medullary canal of radius and broached to a #6 stem by 24 mm, and then sized to a 20 mm x 10 head. Intraoperative fluoroscopy revealed adequate reduction in the radial capitellar joint.  We removed the  trials, irrigated the wound and placed the prosthesis with no undue difficulty.  We then reduced the joint and took intraoperative fluoroscopy, revealed adequate placement of radial capitellar articulation, good placement of the prosthetic device.  The wound was then irrigated and loosely closed in layers of 0 Vicryl to reapproximate the annular ligament and deeper tissues to provide stability, 0 Vicryl for the muscle and 2-0 undyed Vicryl for subcutaneous tissues, and 3-0 Prolene subcuticular stitch on the skin.  Patient had Steri-Strips, 4x4s, fluffs, and a splint applied with the elbow in 90 degrees of flexion, neutral rotation.  The patient tolerated procedure well, went to the recovery room in stable fashion.     Artist PaisMatthew A. Mina MarbleWeingold, M.D.     MAW/MEDQ  D:  07/28/2014  T:  07/28/2014  Job:  [id number]

## 2014-07-28 NOTE — H&P (Deleted)
NAMQuita Camacho:  Gauthreaux, Mackinzee               ACCOUNT NO.:  [id number]  MEDICAL RECORD NO.:  [id number]  LOCATION:                               FACILITY:  [facility]  PHYSICIAN:  Artist PaisMatthew A. Shaida Route, M.D.DATE OF BIRTH:  12-11-1966  DATE OF ADMISSION:  07/28/2014 DATE OF DISCHARGE:  07/29/2014                             HISTORY & PHYSICAL   PREOPERATIVE DIAGNOSIS:  Displaced radial head fracture, right elbow.  POSTOPERATIVE DIAGNOSIS:  Displaced radial head fracture, right elbow.  PROCEDURE:  Radial head replacement, right elbow.  SURGEON:  Artist PaisMatthew A. Mina MarbleWeingold, M.D.  ASSISTANT:  Jonni Sangerobert J. Dasnoit, P.A.  ANESTHESIA:  Supraclavicular block and general.  COMPLICATIONS:  None.  DRAINS:  None.  DESCRIPTION OF PROCEDURE:  The patient was taken to the operating suite. After the induction of adequate supraclavicular block analgesia and then general laryngeal mask airway anesthetic, the right upper extremity was prepped and draped in usual sterile fashion.  An Esmarch was used to exsanguinate the limb.  Tourniquet was inflated to 275 mmHg.  At this point in time, we made an incision centered between the lateral epicondyle and olecranon process in Kocher fashion.  Skin was incised. Dissection was carried down in the interval between the extensor digitorum communis and the extensor carpi radialis longus.  This interval was split down at the radial capitellar joint.  We palpated the radial capitellar joint, split the muscle and entered the joint.  There was a 3-part radial head fracture, the parts were removed with no undue difficulty, placed on the back table for later sizing the radial head implant.  We then used a sagittal saw to make a neck cut followed by placement of a #6 stem and then #10 head trial, this was deemed to be adequate placement and sizing for this implant.  We thoroughly irrigated out all osseous debris.  We then placed the Biomet radial head implant, the ExploR 6 mm x 24 mm stem  and a 10 x 20 mm implant head with no undue difficulty.  We irrigated.  Stability was confirmed radiographically. We then closed in layers of 0 Vicryl for the annular ligament and deeper tissues and muscle layer 2-0 undyed Vicryl subcutaneously and 3-0 Prolene subcuticular stitch on the skin.  Steri-Strips, 4x4s, fluffs, and a splint were applied with the forearm in neutral rotation. Elbow flexed to 90 degrees.  The patient tolerated the procedure well, went to the recovery room in stable fashion.     Artist PaisMatthew A. Mina MarbleWeingold, M.D.     MAW/MEDQ  D:  07/28/2014  T:  07/28/2014  Job:  [id number]

## 2014-07-28 NOTE — Discharge Instructions (Signed)

## 2014-07-28 NOTE — Transfer of Care (Signed)
Immediate Anesthesia Transfer of Care Note  Patient: Rachael Camacho  Procedure(s) Performed: Procedure(s): RIGHT RADIAL HEAD REPLACEMENT (Right)  Patient Location: PACU  Anesthesia Type:General and GA combined with regional for post-op pain  Level of Consciousness: awake  Airway & Oxygen Therapy: Patient Spontanous Breathing and Patient connected to face mask oxygen  Post-op Assessment: Report given to PACU RN and Post -op Vital signs reviewed and stable  Post vital signs: Reviewed and stable  Complications: No apparent anesthesia complications

## 2014-07-28 NOTE — Anesthesia Preprocedure Evaluation (Addendum)
Anesthesia Evaluation  Patient identified by MRN, date of birth, ID band Patient awake    Reviewed: Allergy & Precautions, H&P , NPO status , Patient's Chart, lab work & pertinent test results  Airway Mallampati: II TM Distance: >3 FB Neck ROM: Full    Dental no notable dental hx. (+) Teeth Intact, Dental Advisory Given   Pulmonary neg pulmonary ROS,  breath sounds clear to auscultation  Pulmonary exam normal       Cardiovascular negative cardio ROS  Rhythm:Regular Rate:Normal     Neuro/Psych negative neurological ROS  negative psych ROS   GI/Hepatic Neg liver ROS, GERD-  Medicated and Controlled,  Endo/Other  Morbid obesity  Renal/GU negative Renal ROS  negative genitourinary   Musculoskeletal   Abdominal   Peds  Hematology negative hematology ROS (+)   Anesthesia Other Findings   Reproductive/Obstetrics negative OB ROS                          Anesthesia Physical Anesthesia Plan  ASA: III  Anesthesia Plan: General and Regional   Post-op Pain Management:    Induction: Intravenous  Airway Management Planned: LMA  Additional Equipment:   Intra-op Plan:   Post-operative Plan: Extubation in OR  Informed Consent: I have reviewed the patients History and Physical, chart, labs and discussed the procedure including the risks, benefits and alternatives for the proposed anesthesia with the patient or authorized representative who has indicated his/her understanding and acceptance.   Dental advisory given  Plan Discussed with: CRNA  Anesthesia Plan Comments:         Anesthesia Quick Evaluation

## 2014-07-29 ENCOUNTER — Encounter (HOSPITAL_BASED_OUTPATIENT_CLINIC_OR_DEPARTMENT_OTHER): Payer: Self-pay | Admitting: Orthopedic Surgery

## 2014-11-08 ENCOUNTER — Telehealth: Payer: Self-pay | Admitting: Cardiology

## 2014-11-08 NOTE — Telephone Encounter (Signed)
[facility]Dr C.Duane LopeAlan Ross[facility] for appointment on 11/18 with Dr Jens Somrenshaw Kathryne Sharper([place] location)  [facility] Dr Waunita Schoonerrenshaws [place] on 11/10/14  lp

## 2014-11-10 ENCOUNTER — Encounter: Payer: Self-pay | Admitting: *Deleted

## 2014-11-10 ENCOUNTER — Encounter: Payer: Self-pay | Admitting: Cardiology

## 2014-11-10 ENCOUNTER — Ambulatory Visit (INDEPENDENT_AMBULATORY_CARE_PROVIDER_SITE_OTHER): Payer: BC Managed Care – PPO | Admitting: Cardiology

## 2014-11-10 VITALS — BP 130/78 | HR 70 | Ht 64.0 in | Wt 252.4 lb

## 2014-11-10 DIAGNOSIS — R0602 Shortness of breath: Secondary | ICD-10-CM

## 2014-11-10 DIAGNOSIS — R0600 Dyspnea, unspecified: Secondary | ICD-10-CM

## 2014-11-10 DIAGNOSIS — R0789 Other chest pain: Secondary | ICD-10-CM

## 2014-11-10 DIAGNOSIS — R079 Chest pain, unspecified: Secondary | ICD-10-CM

## 2014-11-10 NOTE — Assessment & Plan Note (Signed)
We discussed weight loss. 

## 2014-11-10 NOTE — Patient Instructions (Signed)
Your physician recommends that you schedule a follow-up appointment in: 8-10 WEEKS WITH DR Jens SomRENSHAW  Your physician has requested that you have an echocardiogram. Echocardiography is a painless test that uses sound waves to create images of your heart. It provides your doctor with information about the size and shape of your heart and how well your heart's chambers and valves are working. This procedure takes approximately one hour. There are no restrictions for this procedure.   Your physician has requested that you have an exercise tolerance test. For further information please visit [email or web address]. Please also follow instruction sheet, as given.   Your physician recommends that you return for lab work WITH TESTING  Exercise Stress Electrocardiogram An exercise stress electrocardiogram is a test to check how blood flows to your heart. It is done to find areas of poor blood flow. You will need to walk on a treadmill for this test. The electrocardiogram will record your heartbeat when you are at rest and when you are exercising. BEFORE THE PROCEDURE  Do not have drinks with caffeine or foods with caffeine for 24 hours before the test, or as told by your doctor. This includes coffee, tea (even decaf tea), sodas, chocolate, and cocoa.  Follow your doctor's instructions about eating and drinking before the test.  Ask your doctor what medicines you should or should not take before the test. Take your medicines with water unless told by your doctor not to.  If you use an inhaler, bring it with you to the test.  Bring a snack to eat after the test.  Do not  smoke for 4 hours before the test.  Do not put lotions, powders, creams, or oils on your chest before the test.  Wear comfortable shoes and clothing. PROCEDURE  You will have patches put on your chest. Small areas of your chest may need to be shaved. Wires will be connected to the patches.  Your heart rate will be watched while you  are resting and while you are exercising.  You will walk on the treadmill. The treadmill will slowly get faster to raise your heart rate.  The test will take about 1-2 hours. AFTER THE PROCEDURE  Your heart rate and blood pressure will be watched after the test.  You may return to your normal diet, activities, and medicines or as told by your doctor. Document Released: 05/28/2008 Document Revised: 04/26/2014 Document Reviewed: 08/17/2013 [facility] Patient Information 2015 [employer] [place]. This information is not intended to replace advice given to you by your health care provider. Make sure you discuss any questions you have with your health care provider.

## 2014-11-10 NOTE — Progress Notes (Signed)
HPI: 48 year old female for evaluation of dyspnea and chest pain. Note patient did have a fracture of her right radial head in August 2015. This required surgical intervention. Patient states that she has had increasing dyspnea on exertion for approximately 6 months. She particularly notices this with walking up hills or climbing stairs. It does not occur inside the house. No orthopnea or PND. Question pedal edema. She also notes chest tightness when she can't breathe. Not pleuritic or positional. Because of the above we were asked to further evaluate. She is not physically conditioned by her report. She has not traveled recently.  Current Outpatient Prescriptions  Medication Sig Dispense Refill  . desloratadine (CLARINEX) 5 MG tablet Take 5 mg by mouth daily.   12  . esomeprazole (NEXIUM) 40 MG capsule Take 40 mg by mouth daily at 12 noon.    . fluconazole (DIFLUCAN) 150 MG tablet Take 150 mg by mouth once. Take 1 tablet now and 1 tablet in 1 week     No current facility-administered medications for this visit.    Allergies  Allergen Reactions  . Percocet [Oxycodone-Acetaminophen] Itching  . [name] [Nitrofurantoin Monohyd Macro] Rash and Hives  . [name] Rash and Hives    Reported chicken pox like rash, not hives  . Sulfa Antibiotics Rash and Hives     Past Medical History  Diagnosis Date  . GERD (gastroesophageal reflux disease)   . Arthritis   . Raynaud disease     hands-toes  . Wears glasses   . IBS (irritable bowel syndrome)     Past Surgical History  Procedure Laterality Date  . Cesarean section  [phone]  . Cholecystectomy  1996    lap choli  . Tubal ligation    . Vaginal hysterectomy  2010    [facility]  . Radial head arthroplasty Right 07/28/2014    Procedure: RIGHT RADIAL HEAD REPLACEMENT;  Surgeon: Dairl PonderMatthew Weingold, MD;  Location: [facility];  Service: Orthopedics;  Laterality: Right;    History   Social History  . Marital Status: Married   Spouse Name: N/A    Number of Children: 2  . 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 Smoking status: Never Smoker   . Smokeless tobacco: Not on file  . Alcohol Use: Yes     Comment: occ  . Drug Use: No  . Sexual Activity: Not on file   Other Topics Concern  . Not on file   Social History Narrative    Family History  Problem Relation Age of Onset  . Heart failure Mother     Stent put in 2014 for blockage  . COPD Mother   . Heart disease Father   . Heart disease Son     Repair ASD    ROS: no fevers or chills, productive cough, hemoptysis, dysphasia, odynophagia, melena, hematochezia, dysuria, hematuria, rash, seizure activity, orthopnea, PND, pedal edema, claudication. Remaining systems are negative.  Physical Exam:   Blood pressure 130/78, pulse 70, height 5\' 4"  (1.626 m), weight 252 lb 6.4 oz (114.488 kg).  General:  Well developed/obese in NAD Skin warm/dry Patient not depressed No peripheral clubbing Back-normal HEENT-normal/normal eyelids Neck supple/normal carotid upstroke bilaterally; no bruits; no JVD; no thyromegaly chest - CTA/ normal expansion CV - RRR/normal S1 and S2; no murmurs, rubs or gallops;  PMI nondisplaced Abdomen -NT/ND, no HSM, no mass, + bowel  sounds, no bruit 2+ femoral pulses, no bruits Ext-no edema, chords, 2+ DP Neuro-grossly nonfocal  ECG Normal sinus rhythm with no ST changes.

## 2014-11-10 NOTE — Assessment & Plan Note (Signed)
Exercise treadmill for risk stratification.

## 2014-11-10 NOTE — Assessment & Plan Note (Signed)
Etiology unclear. Question related to obstructive sleep apnea, deconditioning and OHS. Will arrange echocardiogram to assess left ventricular systolic and diastolic function. Schedule exercise treadmill to screen for ischemia. Check d-dimer. If above negative she will increase her activities to see if conditioning improves her symptoms. She may also need a sleep study.

## 2014-11-11 ENCOUNTER — Telehealth (HOSPITAL_COMMUNITY): Payer: Self-pay | Admitting: *Deleted

## 2014-11-15 ENCOUNTER — Other Ambulatory Visit: Payer: Self-pay | Admitting: *Deleted

## 2014-11-15 ENCOUNTER — Ambulatory Visit (HOSPITAL_COMMUNITY)
Admission: RE | Admit: 2014-11-15 | Discharge: 2014-11-15 | Disposition: A | Discharge status: Home / Self Care | Payer: BC Managed Care – PPO | Source: Ambulatory Visit | Attending: Cardiology | Admitting: Cardiology

## 2014-11-15 DIAGNOSIS — R0600 Dyspnea, unspecified: Secondary | ICD-10-CM

## 2014-11-15 DIAGNOSIS — Z8249 Family history of ischemic heart disease and other diseases of the circulatory system: Secondary | ICD-10-CM | POA: Diagnosis not present

## 2014-11-15 DIAGNOSIS — R079 Chest pain, unspecified: Secondary | ICD-10-CM

## 2014-11-15 DIAGNOSIS — R0602 Shortness of breath: Secondary | ICD-10-CM | POA: Diagnosis present

## 2014-11-15 MED ORDER — FUROSEMIDE 20 MG PO TABS: 20.0000 mg | ORAL_TABLET | Freq: Every day | ORAL | Status: DC

## 2014-11-15 NOTE — Progress Notes (Signed)
2D Echocardiogram Complete.  11/15/2014   Rachael Camacho, RDCS  

## 2014-11-16 LAB — D-DIMER, QUANTITATIVE: D-Dimer, Quant: 0.67 ug/mL-FEU — ABNORMAL HIGH (ref 0.00–0.48)

## 2014-11-18 LAB — BASIC METABOLIC PANEL WITH GFR
BUN: 17 mg/dL (ref 6–23)
CHLORIDE: 105 meq/L (ref 96–112)
CO2: 24 mEq/L (ref 19–32)
Calcium: 9.5 mg/dL (ref 8.4–10.5)
Creat: 0.68 mg/dL (ref 0.50–1.10)
Glucose, Bld: 77 mg/dL (ref 70–99)
POTASSIUM: 4.5 meq/L (ref 3.5–5.3)
Sodium: 139 mEq/L (ref 135–145)

## 2014-11-18 LAB — BRAIN NATRIURETIC PEPTIDE: Brain Natriuretic Peptide: 20.4 pg/mL (ref 0.0–100.0)

## 2014-11-19 ENCOUNTER — Other Ambulatory Visit: Payer: Self-pay | Admitting: *Deleted

## 2014-11-19 DIAGNOSIS — R7989 Other specified abnormal findings of blood chemistry: Secondary | ICD-10-CM

## 2014-11-25 ENCOUNTER — Telehealth: Payer: Self-pay | Admitting: Cardiology

## 2014-11-25 NOTE — Telephone Encounter (Signed)
Spoke with pt, aware she does not need anymore blood work.

## 2014-11-25 NOTE — Telephone Encounter (Signed)
Please call,question about getting lab work. [facility]wonder if she needs it again this week..[name]

## 2014-11-26 ENCOUNTER — Ambulatory Visit (INDEPENDENT_AMBULATORY_CARE_PROVIDER_SITE_OTHER)
Admission: RE | Admit: 2014-11-26 | Discharge: 2014-11-26 | Disposition: A | Discharge status: Home / Self Care | Payer: BC Managed Care – PPO | Source: Ambulatory Visit | Attending: Cardiology | Admitting: Cardiology

## 2014-11-26 DIAGNOSIS — R7989 Other specified abnormal findings of blood chemistry: Secondary | ICD-10-CM

## 2014-11-26 DIAGNOSIS — R791 Abnormal coagulation profile: Secondary | ICD-10-CM

## 2014-11-26 MED ORDER — IOHEXOL 350 MG/ML SOLN
90.0000 mL | Freq: Once | INTRAVENOUS | Status: AC | PRN
  Administered 2014-11-26: 90 mL via INTRAVENOUS

## 2014-12-09 ENCOUNTER — Telehealth (HOSPITAL_COMMUNITY): Payer: Self-pay

## 2014-12-09 ENCOUNTER — Encounter (HOSPITAL_COMMUNITY): Payer: BC Managed Care – PPO

## 2014-12-09 NOTE — Telephone Encounter (Signed)
Encounter complete. 

## 2014-12-10 ENCOUNTER — Telehealth (HOSPITAL_COMMUNITY): Payer: Self-pay

## 2014-12-10 NOTE — Telephone Encounter (Signed)
Encounter complete. 

## 2014-12-14 ENCOUNTER — Ambulatory Visit (HOSPITAL_COMMUNITY)
Admission: RE | Admit: 2014-12-14 | Discharge: 2014-12-14 | Disposition: A | Discharge status: Home / Self Care | Payer: BC Managed Care – PPO | Source: Ambulatory Visit | Attending: Cardiology | Admitting: Cardiology

## 2014-12-14 DIAGNOSIS — R0602 Shortness of breath: Secondary | ICD-10-CM

## 2014-12-14 DIAGNOSIS — R079 Chest pain, unspecified: Secondary | ICD-10-CM | POA: Insufficient documentation

## 2014-12-14 NOTE — Procedures (Signed)
Exercise Treadmill Test  Pre-Exercise Testing Evaluation Rhythm: normal sinus  Rate: 80   ST Segments:  no significant ST changes at rest     Test  Exercise Tolerance Test Ordering MD: Olga MillersBrian Crenshaw, MD    Unique Test No: 1  Treadmill:  1  Indication for ETT: chest pain - rule out ischemia  Contraindication to ETT: No   Stress Modality: exercise - treadmill  Cardiac Imaging Performed: non   Protocol: standard Bruce - maximal  Max BP:  215/90  Max MPHR (bpm):  171 85% MPR (bpm):  137  MPHR obtained (bpm):  151 % MPHR obtained:  88  Reached 85% MPHR (min:sec):  ~4:15 Total Exercise Time (min-sec):  4:30  Workload in METS:  6.4 Borg Scale: 16  Reason ETT Terminated:  dyspnea - Severe    ST Segment Analysis At Rest: normal ST segments - no evidence of significant ST depression With Exercise: no evidence of significant ST depression  Other Information Arrhythmia:  No Angina during ETT:  absent (0) - SEVERE Dyspnea Quality of ETT:  indeterminate  ETT Interpretation:  normal - no evidence of ischemia by ST analysis  Comments:  Moderate to severely reduced exercise tolerance given the extent of limiting dyspnea @ 4:30 mi exercise (Borg Scale 16)  Poor recovery time <20bpm in 1 min.  No EKG evidence of ischemic changes.  Recommendations:  Lifestyle modification  Weight Loss - Diet & Exercise  If clinically indicated, would consider pharmacologic imaging stress test for better sensitivity / specificity.  Marykay LexHARDING, Kaylin Marcon W, M.D., M.S. Interventional Cardiologist   Pager # [phone]

## 2014-12-31 ENCOUNTER — Encounter: Payer: Self-pay | Admitting: Cardiology

## 2014-12-31 ENCOUNTER — Ambulatory Visit (INDEPENDENT_AMBULATORY_CARE_PROVIDER_SITE_OTHER): Payer: BLUE CROSS/BLUE SHIELD | Admitting: Cardiology

## 2014-12-31 VITALS — BP 132/84 | HR 87 | Ht 64.0 in | Wt 250.0 lb

## 2014-12-31 DIAGNOSIS — G4730 Sleep apnea, unspecified: Secondary | ICD-10-CM

## 2014-12-31 DIAGNOSIS — R0600 Dyspnea, unspecified: Secondary | ICD-10-CM

## 2014-12-31 NOTE — Patient Instructions (Signed)
Your physician wants you to follow-up in: 6 MONTHS WITH DR Jens SomRENSHAW You will receive a reminder letter in the mail two months in advance. If you don't receive a letter, please call our office to schedule the follow-up appointment.   REFERRAL TO PULMONARY FOR SLEEP APNEA  REFERRAL TO DR Wenda LowMATT Gustafson TO DISCUSS BARIATRIC SURGERY  Your physician recommends that you HAVE LAB WORK TODAY

## 2014-12-31 NOTE — Assessment & Plan Note (Signed)
Discussed weight loss and referred to bariatric clinic.

## 2014-12-31 NOTE — Progress Notes (Signed)
      HPI: FU dyspnea and chest pain. BNP 11/15 20. Echo 11/15 showed normal LV function, grade 2 diastolic dysfunction. CTA 12/15 showed no PE, possible pulm HTN, coronary atherosclerosis. ETT 12/15 showed poor exercise tolerance, no ST changes. Since last seen, she continues to have some dyspnea on. No orthopnea or PND. No chest pain or syncope.  Current Outpatient Prescriptions  Medication Sig Dispense Refill  . desloratadine (CLARINEX) 5 MG tablet Take 5 mg by mouth as needed.   12  . esomeprazole (NEXIUM) 40 MG capsule Take 40 mg by mouth daily at 12 noon.    . fluticasone (FLONASE) 50 MCG/ACT nasal spray Place 1 spray into the nose as needed.    . furosemide (LASIX) 20 MG tablet Take 1 tablet (20 mg total) by mouth daily. 90 tablet 3   No current facility-administered medications for this visit.     Past Medical History  Diagnosis Date  . GERD (gastroesophageal reflux disease)   . Arthritis   . Raynaud disease     hands-toes  . Wears glasses   . IBS (irritable bowel syndrome)     Past Surgical History  Procedure Laterality Date  . Cesarean section  [id number]  . Cholecystectomy  1996    lap choli  . Tubal ligation    . Vaginal hysterectomy  2010    [facility]  . Radial head arthroplasty Right 07/28/2014    Procedure: RIGHT RADIAL HEAD REPLACEMENT;  Surgeon: Dairl PonderMatthew Weingold, MD;  Location: [facility];  Service: Orthopedics;  Laterality: Right;    History   Social History  . Marital Status: Married    Spouse Name: N/A    Number of Children: 2  . 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 Smoking status: Never Smoker   . Smokeless tobacco: Not on file  . Alcohol Use: Yes     Comment: occ  . Drug Use: No  . Sexual Activity: Not on file   Other Topics Concern  . Not on file   Social History Narrative    ROS: no fevers or chills, productive cough, hemoptysis, dysphasia, odynophagia, melena,  hematochezia, dysuria, hematuria, rash, seizure activity, orthopnea, PND, claudication. Remaining systems are negative.  Physical Exam: Well-developed obese in no acute distress.  Skin is warm and dry.  HEENT is normal.  Neck is supple.  Chest is clear to auscultation with normal expansion.  Cardiovascular exam is regular rate and rhythm.  Abdominal exam nontender or distended. No masses palpated. Extremities show trace edema. neuro grossly intact

## 2015-01-01 LAB — BASIC METABOLIC PANEL WITH GFR
BUN: 14 mg/dL (ref 6–23)
CO2: 28 mEq/L (ref 19–32)
Calcium: 9.8 mg/dL (ref 8.4–10.5)
Chloride: 105 mEq/L (ref 96–112)
Creat: 0.85 mg/dL (ref 0.50–1.10)
GFR, Est African American: >89 mL/min
GFR, Est Non African American: 81 mL/min
GLUCOSE: 78 mg/dL (ref 70–99)
Potassium: 4.2 mEq/L (ref 3.5–5.3)
SODIUM: 141 meq/L (ref 135–145)

## 2015-01-05 ENCOUNTER — Ambulatory Visit: Payer: BC Managed Care – PPO | Admitting: Cardiology

## 2015-01-26 ENCOUNTER — Ambulatory Visit (INDEPENDENT_AMBULATORY_CARE_PROVIDER_SITE_OTHER): Payer: BLUE CROSS/BLUE SHIELD | Admitting: Pulmonary Disease

## 2015-01-26 ENCOUNTER — Encounter (INDEPENDENT_AMBULATORY_CARE_PROVIDER_SITE_OTHER): Payer: Self-pay

## 2015-01-26 ENCOUNTER — Encounter: Payer: Self-pay | Admitting: Pulmonary Disease

## 2015-01-26 VITALS — BP 148/100 | HR 91 | Ht 64.5 in | Wt 253.0 lb

## 2015-01-26 DIAGNOSIS — G4733 Obstructive sleep apnea (adult) (pediatric): Secondary | ICD-10-CM

## 2015-01-26 DIAGNOSIS — R0600 Dyspnea, unspecified: Secondary | ICD-10-CM

## 2015-01-26 DIAGNOSIS — R0609 Other forms of dyspnea: Secondary | ICD-10-CM

## 2015-01-26 NOTE — Assessment & Plan Note (Signed)
Given excessive daytime somnolence, narrow pharyngeal exam, witnessed apneas & loud snoring, obstructive sleep apnea is very likely & an overnight polysomnogram will be scheduled as a home study. The pathophysiology of obstructive sleep apnea , it's cardiovascular consequences & modes of treatment including CPAP were discused with the patient in detail & they evidenced understanding.  

## 2015-01-26 NOTE — Progress Notes (Signed)
Subjective:    Patient ID: Rachael Camacho, female    DOB: [birth year], 49 y.o.   MRN: [id number]  HPI   49 year old never smoker presents for evaluation of sleep-disordered breathing. She underwent cardiac evaluation for dyspnea on exertion-  Echo 11/12/14 showed grade 2 diastolic dysfunction, with normal systolic function CT angios 11/2014 was negative for pulmonary embolism, but showed mosaic pattern suggestive of early interstitial edema. She was started on 20 mg of Lasix, but that she has been compliant.  Epworth sleepiness score is 13. Bedtime is around midnight, she admits to being a night owl, sleep latency is minimal, she sleeps on her back with 3 pillows, this orthopnea is new for her over the last few months, reports one to 2 bathroom visits, is out of bed by 6 AM feeling tired with dryness of mouth. She does take a nap on weekends which are refreshing. She denies excessive use of caffeinated beverages She was hypertensive today with saturation about 93%-on walking she didn't desaturate to 84%  Past Medical History  Diagnosis Date  . GERD (gastroesophageal reflux disease)   . Arthritis   . Raynaud disease     hands-toes  . Wears glasses   . IBS (irritable bowel syndrome)    Past Surgical History  Procedure Laterality Date  . Cesarean section  [phone]  . Cholecystectomy  1996    lap choli  . Tubal ligation    . Vaginal hysterectomy  2010    [facility]  . Radial head arthroplasty Right 07/28/2014    Procedure: RIGHT RADIAL HEAD REPLACEMENT;  Surgeon: Dairl PonderMatthew Weingold, MD;  Location: [facility];  Service: Orthopedics;  Laterality: Right;   Allergies  Allergen Reactions  . Percocet [Oxycodone-Acetaminophen] Itching  . [name] [Nitrofurantoin Monohyd Macro] Rash and Hives  . [name] Rash and Hives    Reported chicken pox like rash, not hives  . Sulfa Antibiotics Rash and Hives    History   Social History  . Marital Status: Married    Spouse Name: N/A     Number of Children: 2  . 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 Smoking status: Never Smoker   . Smokeless tobacco: Not on file  . Alcohol Use: Yes     Comment: occ  . Drug Use: No  . Sexual Activity: Not on file   Other Topics Concern  . Not on file   Social History Narrative    Family History  Problem Relation Age of Onset  . Heart failure Mother     Stent put in 2014 for blockage  . COPD Mother   . Heart disease Father   . Heart disease Son     Repair ASD     Review of Systems  Constitutional: Negative for fever and unexpected weight change.  HENT: Positive for congestion and postnasal drip. Negative for dental problem, ear pain, nosebleeds, rhinorrhea, sinus pressure, sneezing, sore throat and trouble swallowing.   Eyes: Negative for redness and itching.  Respiratory: Positive for chest tightness and shortness of breath. Negative for cough and wheezing.   Cardiovascular: Positive for leg swelling. Negative for palpitations.  Gastrointestinal: Negative for nausea and vomiting.  Genitourinary: Negative for dysuria.  Musculoskeletal: Negative for joint swelling.  Skin: Negative for rash.  Neurological: Negative for headaches.  Hematological: Does not bruise/bleed easily.  Psychiatric/Behavioral: Negative for dysphoric mood. The patient is not  nervous/anxious.        Objective:   Physical Exam  Gen. Pleasant, obese, in no distress, normal affect ENT - no lesions, no post nasal drip, class 2-3 airway Neck: No JVD, no thyromegaly, no carotid bruits Lungs: no use of accessory muscles, no dullness to percussion, decreased without rales or rhonchi  Cardiovascular: Rhythm regular, heart sounds  normal, no murmurs or gallops, no peripheral edema Abdomen: soft and non-tender, no hepatosplenomegaly, BS normal. Musculoskeletal: No deformities, no cyanosis or clubbing Neuro:  alert, non focal, no  tremors       Assessment & Plan:

## 2015-01-26 NOTE — Assessment & Plan Note (Signed)
Mosaic pattern on her CT, recent orthopnea, and echo are consistent with diastolic heart failure. Her oxygen desaturation today is concerning. I have asked her to increase her Lasix to 40 mg daily for about a week-hopefully we can get her cardiology appointment in that timeframe to reassess. The right heart pressures could not be estimated on echo, I wonder if she needs a cardiac catheterization I do not suspect pulmonary disease in this never smoker-pulmonary embolism has been ruled out

## 2015-01-26 NOTE — Patient Instructions (Addendum)
Home sleep study Breathing test Make earlier appt with dr Jens Somcrenshaw Stay on lasix

## 2015-02-03 ENCOUNTER — Telehealth: Payer: Self-pay | Admitting: *Deleted

## 2015-02-03 NOTE — Telephone Encounter (Signed)
Spoke with pt, Follow up scheduled  

## 2015-02-03 NOTE — Telephone Encounter (Signed)
-----   Message from Lewayne BuntingBrian S Crenshaw, MD sent at 01/26/2015  5:34 PM EST ----- I can arrange fu ov and discuss Rachael MillersBrian Crenshaw  ----- Message -----    From: Oretha Milchakesh Alva V, MD    Sent: 01/26/2015   3:05 PM      To: Lewayne BuntingBrian S Crenshaw, MD  Saw her for sleep She desatn with walking ? Diastolic CHF - does she need cath? Never smoker - no pulm dz Kathy Breachakesh

## 2015-02-10 ENCOUNTER — Telehealth: Payer: Self-pay | Admitting: Pulmonary Disease

## 2015-02-10 NOTE — Telephone Encounter (Signed)
Per Rachael Camacho it may be another 2 weeks before pt can be scheduled for home sleep test.  Spoke with pt and she states she is trying to get this done before seeing cardiologist soon.  Rachael Camacho, can you see if sleep center can do this sooner?

## 2015-02-14 NOTE — Telephone Encounter (Signed)
i have moved her up for the next pt so she will be called sometime this week Tobe SosSally E Camacho

## 2015-02-16 NOTE — Telephone Encounter (Signed)
Pt is aware that she will be contact this week about her home sleep study.

## 2015-02-22 ENCOUNTER — Other Ambulatory Visit (INDEPENDENT_AMBULATORY_CARE_PROVIDER_SITE_OTHER): Payer: Self-pay | Admitting: Surgery

## 2015-02-22 DIAGNOSIS — G4730 Sleep apnea, unspecified: Secondary | ICD-10-CM

## 2015-02-24 ENCOUNTER — Telehealth: Payer: Self-pay | Admitting: Pulmonary Disease

## 2015-02-24 ENCOUNTER — Encounter: Payer: Self-pay | Admitting: Pulmonary Disease

## 2015-02-24 ENCOUNTER — Other Ambulatory Visit: Payer: Self-pay | Admitting: *Deleted

## 2015-02-24 DIAGNOSIS — G4730 Sleep apnea, unspecified: Secondary | ICD-10-CM

## 2015-02-24 DIAGNOSIS — G4733 Obstructive sleep apnea (adult) (pediatric): Secondary | ICD-10-CM

## 2015-02-24 NOTE — Telephone Encounter (Signed)
lmtcb 02/24/15

## 2015-02-24 NOTE — Telephone Encounter (Signed)
Patient notified of results.  Sent to Dr. Vassie LollAlva for follow up.  Patient awaiting decision on what to do next.  Copy of Sleep Study results sent to Dr. Luretha MurphyMatthew Shomaker per patient's request.  To RA:  Please advise what is next step?

## 2015-02-24 NOTE — Telephone Encounter (Signed)
Home sleep study 02/22/15 >> AHI 10.6, SaO2 low 70%.  She spent 329.7 min (84.7% of test time) with SaO2 < 90%.  Consistent with mild sleep apnea and suggestive of sleep related hypoventilation/hypoxia.  Will route to Dr. Vassie LollAlva and his RN to f/u with pt.

## 2015-03-02 NOTE — Telephone Encounter (Signed)
Please schedule C Pap titration study -due to desaturation, would prefer an attended study

## 2015-03-03 NOTE — Telephone Encounter (Signed)
lmtcb

## 2015-03-04 ENCOUNTER — Telehealth: Payer: Self-pay | Admitting: Pulmonary Disease

## 2015-03-04 DIAGNOSIS — G4733 Obstructive sleep apnea (adult) (pediatric): Secondary | ICD-10-CM

## 2015-03-04 NOTE — Telephone Encounter (Signed)
Rachael Milchakesh Alva V, MD at 03/02/2015 5:17 PM     Status: Signed       Expand All Collapse All   Please schedule C Pap titration study -due to desaturation, would prefer an attended study      --  I spoke with patient about results and she verbalized understanding and had no questions. Order placed

## 2015-03-06 NOTE — Progress Notes (Signed)
      HPI: FU dyspnea and chest pain. BNP 11/15 20. Echo 11/15 showed normal LV function, grade 2 diastolic dysfunction. CTA 12/15 showed no PE, possible pulm HTN, coronary atherosclerosis. ETT 12/15 showed poor exercise tolerance, no ST changes. Seen recently by pulmonary and lasix increased for diastolic CHF; Dr Alva questioned need for RHC. Patient continues to have significant dyspnea on exertion. No orthopnea, PND, pedal edema. Occasional chest tightness with anxiety. Not exertional.  Current Outpatient Prescriptions  Medication Sig Dispense Refill  . desloratadine (CLARINEX) 5 MG tablet Take 5 mg by mouth as needed.   12  . esomeprazole (NEXIUM) 40 MG capsule Take 40 mg by mouth daily at 12 noon.    . fluticasone (FLONASE) 50 MCG/ACT nasal spray Place 1 spray into the nose as needed.    . furosemide (LASIX) 20 MG tablet Take 1 tablet (20 mg total) by mouth daily. 90 tablet 3   No current facility-administered medications for this visit.     Past Medical History  Diagnosis Date  . GERD (gastroesophageal reflux disease)   . Arthritis   . Raynaud disease     hands-toes  . Wears glasses   . IBS (irritable bowel syndrome)     Past Surgical History  Procedure Laterality Date  . Cesarean section  87,95  . Cholecystectomy  1996    lap choli  . Tubal ligation    . Vaginal hysterectomy  2010    [facility]  . Radial head arthroplasty Right 07/28/2014    Procedure: RIGHT RADIAL HEAD REPLACEMENT;  Surgeon: Matthew Weingold, MD;  Location: [facility];  Service: Orthopedics;  Laterality: Right;    History   Social History  . Marital Status: Married    Spouse Name: N/A  . Number of Children: 2  . 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 Smoking status: Never Smoker   . Smokeless tobacco: Not on file  . Alcohol Use: 0.0 oz/week    0 Standard drinks or equivalent per week     Comment: occ  . Drug Use: No  .  Sexual Activity: Not on file   Other Topics Concern  . Not on file   Social History Narrative    ROS: no fevers or chills, productive cough, hemoptysis, dysphasia, odynophagia, melena, hematochezia, dysuria, hematuria, rash, seizure activity, orthopnea, PND, pedal edema, claudication. Remaining systems are negative.  Physical Exam: Well-developed obesity in no acute distress.  Skin is warm and dry.  HEENT is normal.  Neck is supple.  Chest is clear to auscultation with normal expansion.  Cardiovascular exam is regular rate and rhythm.  Abdominal exam nontender or distended. No masses palpated. Extremities show no edema. neuro grossly intact       

## 2015-03-07 NOTE — Telephone Encounter (Signed)
lmtcb

## 2015-03-08 ENCOUNTER — Other Ambulatory Visit: Payer: Self-pay | Admitting: Cardiology

## 2015-03-08 ENCOUNTER — Encounter: Payer: Self-pay | Admitting: Cardiology

## 2015-03-08 ENCOUNTER — Encounter: Payer: Self-pay | Admitting: *Deleted

## 2015-03-08 ENCOUNTER — Telehealth: Payer: Self-pay | Admitting: Pulmonary Disease

## 2015-03-08 ENCOUNTER — Other Ambulatory Visit: Payer: Self-pay

## 2015-03-08 ENCOUNTER — Ambulatory Visit (HOSPITAL_COMMUNITY)
Admission: RE | Admit: 2015-03-08 | Discharge: 2015-03-08 | Disposition: A | Discharge status: Home / Self Care | Payer: BLUE CROSS/BLUE SHIELD | Source: Ambulatory Visit | Attending: Surgery | Admitting: Surgery

## 2015-03-08 ENCOUNTER — Ambulatory Visit (INDEPENDENT_AMBULATORY_CARE_PROVIDER_SITE_OTHER): Payer: BLUE CROSS/BLUE SHIELD | Admitting: Cardiology

## 2015-03-08 VITALS — BP 124/86 | HR 70 | Ht 64.5 in | Wt 254.2 lb

## 2015-03-08 DIAGNOSIS — I7300 Raynaud's syndrome without gangrene: Secondary | ICD-10-CM | POA: Insufficient documentation

## 2015-03-08 DIAGNOSIS — R0600 Dyspnea, unspecified: Secondary | ICD-10-CM | POA: Diagnosis not present

## 2015-03-08 DIAGNOSIS — G4733 Obstructive sleep apnea (adult) (pediatric): Secondary | ICD-10-CM

## 2015-03-08 DIAGNOSIS — Z6841 Body Mass Index (BMI) 40.0 and over, adult: Secondary | ICD-10-CM | POA: Diagnosis not present

## 2015-03-08 DIAGNOSIS — K219 Gastro-esophageal reflux disease without esophagitis: Secondary | ICD-10-CM | POA: Diagnosis not present

## 2015-03-08 DIAGNOSIS — M1990 Unspecified osteoarthritis, unspecified site: Secondary | ICD-10-CM | POA: Diagnosis not present

## 2015-03-08 DIAGNOSIS — Z9049 Acquired absence of other specified parts of digestive tract: Secondary | ICD-10-CM | POA: Insufficient documentation

## 2015-03-08 DIAGNOSIS — K589 Irritable bowel syndrome without diarrhea: Secondary | ICD-10-CM | POA: Insufficient documentation

## 2015-03-08 NOTE — Assessment & Plan Note (Signed)
Management per pulmonary. 

## 2015-03-08 NOTE — Assessment & Plan Note (Signed)
Patient continues to complain of dyspnea. Etiology is unclear. Echocardiogram showed normal LV function and grade 2 diastolic dysfunction. Diuresis did not improve symptoms. Previous chest CT showed no pulmonary embolus but there was note of coronary atherosclerosis. Question if dyspnea is related to obstructive sleep apnea, deconditioning and obesity hypoventilation syndrome. Pulmonary is evaluating and suggests right and left catheterization which I think is appropriate. I discussed this with the patient today and she is willing to proceed. The risks including but not limited to myocardial infarction, death and CVA discussed and she agrees to proceed.

## 2015-03-08 NOTE — Telephone Encounter (Signed)
lmtcb

## 2015-03-08 NOTE — Assessment & Plan Note (Signed)
Patient is being evaluated for gastric sleeve.

## 2015-03-08 NOTE — Telephone Encounter (Signed)
Rachael Milchakesh Alva V, MD at 03/02/2015 5:17 PM     Status: Signed       Expand All Collapse All   Please schedule C Pap titration study -due to desaturation, would prefer an attended study       I spoke with patient about results and she verbalized understanding and had no questions. Pt already scheduled for 4/10. nothing further needed

## 2015-03-08 NOTE — Patient Instructions (Signed)
Your physician recommends that you schedule a follow-up appointment in: 4 WEEKS WITH DR Jens SomRENSHAW  Your physician has requested that you have a cardiac catheterization. Cardiac catheterization is used to diagnose and/or treat various heart conditions. Doctors may recommend this procedure for a number of different reasons. The most common reason is to evaluate chest pain. Chest pain can be a symptom of coronary artery disease (CAD), and cardiac catheterization can show whether plaque is narrowing or blocking your heart's arteries. This procedure is also used to evaluate the valves, as well as measure the blood flow and oxygen levels in different parts of your heart. For further information please visit [email or web address]. Please follow instruction sheet, as given.   Your physician recommends that you HAVE LAB WORK TODAY  START ASPIRIN 81 MG ONCE DAILY

## 2015-03-09 ENCOUNTER — Ambulatory Visit (INDEPENDENT_AMBULATORY_CARE_PROVIDER_SITE_OTHER): Payer: BLUE CROSS/BLUE SHIELD | Admitting: Pulmonary Disease

## 2015-03-09 ENCOUNTER — Encounter: Payer: Self-pay | Admitting: Pulmonary Disease

## 2015-03-09 ENCOUNTER — Other Ambulatory Visit (INDEPENDENT_AMBULATORY_CARE_PROVIDER_SITE_OTHER): Payer: BLUE CROSS/BLUE SHIELD

## 2015-03-09 VITALS — BP 110/80 | HR 75 | Ht 64.5 in | Wt 256.5 lb

## 2015-03-09 DIAGNOSIS — R0609 Other forms of dyspnea: Secondary | ICD-10-CM

## 2015-03-09 DIAGNOSIS — G4733 Obstructive sleep apnea (adult) (pediatric): Secondary | ICD-10-CM

## 2015-03-09 DIAGNOSIS — R0600 Dyspnea, unspecified: Secondary | ICD-10-CM

## 2015-03-09 DIAGNOSIS — I7300 Raynaud's syndrome without gangrene: Secondary | ICD-10-CM

## 2015-03-09 LAB — PULMONARY FUNCTION TEST
DL/VA % PRED: 67 %
DL/VA: 3.29 ml/min/mmHg/L
DLCO unc % pred: 52 %
DLCO unc: 13.15 ml/min/mmHg
FEF 25-75 POST: 3.69 L/s
FEF 25-75 PRE: 2.65 L/s
FEF2575-%Change-Post: 39 %
FEF2575-%PRED-PRE: 94 %
FEF2575-%Pred-Post: 132 %
FEV1-%CHANGE-POST: 6 %
FEV1-%PRED-PRE: 83 %
FEV1-%Pred-Post: 88 %
FEV1-Post: 2.53 L
FEV1-Pre: 2.37 L
FEV1FVC-%Change-Post: 6 %
FEV1FVC-%Pred-Pre: 103 %
FEV6-%CHANGE-POST: 0 %
FEV6-%PRED-PRE: 81 %
FEV6-%Pred-Post: 81 %
FEV6-POST: 2.87 L
FEV6-PRE: 2.85 L
FEV6FVC-%PRED-POST: 102 %
FEV6FVC-%PRED-PRE: 102 %
FVC-%Change-Post: 0 %
FVC-%PRED-PRE: 79 %
FVC-%Pred-Post: 79 %
FVC-Post: 2.87 L
FVC-Pre: 2.85 L
Post FEV1/FVC ratio: 88 %
Post FEV6/FVC ratio: 100 %
Pre FEV1/FVC ratio: 83 %
Pre FEV6/FVC Ratio: 100 %
RV % pred: 62 %
RV: 1.12 L
TLC % PRED: 78 %
TLC: 4.02 L

## 2015-03-09 LAB — CBC
HCT: 41.4 % (ref 36.0–46.0)
Hemoglobin: 13.9 g/dL (ref 12.0–15.0)
MCH: 29.7 pg (ref 26.0–34.0)
MCHC: 33.6 g/dL (ref 30.0–36.0)
MCV: 88.5 fL (ref 78.0–100.0)
MPV: 10.9 fL (ref 8.6–12.4)
PLATELETS: 291 10*3/uL (ref 150–400)
RBC: 4.68 MIL/uL (ref 3.87–5.11)
RDW: 13.5 % (ref 11.5–15.5)
WBC: 8.2 10*3/uL (ref 4.0–10.5)

## 2015-03-09 LAB — BASIC METABOLIC PANEL WITH GFR
BUN: 14 mg/dL (ref 6–23)
CALCIUM: 9.2 mg/dL (ref 8.4–10.5)
CO2: 28 mEq/L (ref 19–32)
CREATININE: 0.75 mg/dL (ref 0.50–1.10)
Chloride: 106 mEq/L (ref 96–112)
Glucose, Bld: 93 mg/dL (ref 70–99)
Potassium: 3.9 mEq/L (ref 3.5–5.3)
Sodium: 141 mEq/L (ref 135–145)

## 2015-03-09 LAB — PROTIME-INR
INR: 1 (ref <1.50)
PROTHROMBIN TIME: 13.2 s (ref 11.6–15.2)

## 2015-03-09 NOTE — Assessment & Plan Note (Signed)
C Pap titration study is scheduled for April 10. Based on this she will be started on C Pap. We will also evaluate for nocturnal hypoxia

## 2015-03-09 NOTE — Progress Notes (Signed)
PFT done today. 

## 2015-03-09 NOTE — Progress Notes (Signed)
   Subjective:    Patient ID: Rachael Camacho, female    DOB: 1966/01/24, 49 y.o.   MRN: [id number]  HPI  49 year old never smoker for FU of DOE & OSA. She underwent cardiac evaluation for dyspnea on exertion-  Echo 11/12/14 showed grade 2 diastolic dysfunction, with normal systolic function CT angios 11/2014 was negative for pulmonary embolism, but showed mosaic pattern suggestive of early interstitial edema. She was started on 20 mg of Lasix, she has been compliant - no benefit Cardiac cath is planned for tomorrow She desaturated to 93% on walking today  Saw Dr Corliss Skainsdeveshwar i n the past for raynauds', arthritis  Significant tests/ events  01/2015 saturation about 93%-on walking she desaturate to 84%  Home sleep study 02/22/15 >> AHI 10.6, SaO2 low 70%. She spent 329.7 min (84.7% of test time) with SaO2 < 90%. Consistent with mild sleep apnea   PFTs 03/09/15 no obstruction, mild restriction TLC 78%, DLCO 52%  Past Medical History  Diagnosis Date  . GERD (gastroesophageal reflux disease)   . Arthritis   . Raynaud disease     hands-toes  . Wears glasses   . IBS (irritable bowel syndrome)      Review of Systems  neg for any significant sore throat, dysphagia, itching, sneezing, nasal congestion or excess/ purulent secretions, fever, chills, sweats, unintended wt loss, pleuritic or exertional cp, hempoptysis, orthopnea pnd or change in chronic leg swelling. Also denies presyncope, palpitations, heartburn, abdominal pain, nausea, vomiting, diarrhea or change in bowel or urinary habits, dysuria,hematuria, rash, arthralgias, visual complaints, headache, numbness weakness or ataxia.     Objective:   Physical Exam  Gen. Pleasant, obese, in no distress ENT - no lesions, no post nasal drip Neck: No JVD, no thyromegaly, no carotid bruits Lungs: no use of accessory muscles, no dullness to percussion, decreased without rales or rhonchi  Cardiovascular: Rhythm regular, heart sounds   normal, no murmurs or gallops, no peripheral edema Musculoskeletal: No deformities, no cyanosis or clubbing , no tremors       Assessment & Plan:

## 2015-03-09 NOTE — Assessment & Plan Note (Signed)
Left and right heart cath scheduled to evaluate pulmonary pressures. Some pulmonary arterial dilatation was noted on CT chest. We'll also obtain basic markers for inflammation-ANA, ESR and CCP. Screen for collagen-vascular disease-given her history of Raynauds

## 2015-03-09 NOTE — Patient Instructions (Addendum)
Good luck with heart cath We will obtain blood work for inflammation You will need CPAP after titration study

## 2015-03-10 LAB — SEDIMENTATION RATE: Sed Rate: 22 mm/hr (ref 0–22)

## 2015-03-10 LAB — CYCLIC CITRUL PEPTIDE ANTIBODY, IGG: Cyclic Citrullin Peptide Ab: <2 U/mL (ref 0.0–5.0)

## 2015-03-11 LAB — ANA: ANA: POSITIVE — AB

## 2015-03-11 LAB — ANTI-NUCLEAR AB-TITER (ANA TITER): ANA Titer 1: 1:160 {titer} — ABNORMAL HIGH

## 2015-03-14 ENCOUNTER — Telehealth: Payer: Self-pay | Admitting: Pulmonary Disease

## 2015-03-14 ENCOUNTER — Encounter (HOSPITAL_COMMUNITY): Payer: Self-pay | Admitting: Cardiology

## 2015-03-14 ENCOUNTER — Ambulatory Visit (HOSPITAL_COMMUNITY)
Admission: RE | Admit: 2015-03-14 | Discharge: 2015-03-14 | Disposition: A | Discharge status: Home / Self Care | Payer: BLUE CROSS/BLUE SHIELD | Source: Ambulatory Visit | Attending: Cardiology | Admitting: Cardiology

## 2015-03-14 ENCOUNTER — Encounter (HOSPITAL_COMMUNITY)
Admission: RE | Disposition: A | Discharge status: Home / Self Care | Payer: Self-pay | Source: Ambulatory Visit | Attending: Cardiology

## 2015-03-14 DIAGNOSIS — K219 Gastro-esophageal reflux disease without esophagitis: Secondary | ICD-10-CM | POA: Insufficient documentation

## 2015-03-14 DIAGNOSIS — I272 Other secondary pulmonary hypertension: Secondary | ICD-10-CM | POA: Diagnosis not present

## 2015-03-14 DIAGNOSIS — R899 Unspecified abnormal finding in specimens from other organs, systems and tissues: Secondary | ICD-10-CM

## 2015-03-14 DIAGNOSIS — Z9851 Tubal ligation status: Secondary | ICD-10-CM | POA: Insufficient documentation

## 2015-03-14 DIAGNOSIS — I2510 Atherosclerotic heart disease of native coronary artery without angina pectoris: Secondary | ICD-10-CM | POA: Insufficient documentation

## 2015-03-14 DIAGNOSIS — I7300 Raynaud's syndrome without gangrene: Secondary | ICD-10-CM | POA: Diagnosis not present

## 2015-03-14 DIAGNOSIS — R0600 Dyspnea, unspecified: Secondary | ICD-10-CM | POA: Diagnosis present

## 2015-03-14 DIAGNOSIS — Z90710 Acquired absence of both cervix and uterus: Secondary | ICD-10-CM | POA: Insufficient documentation

## 2015-03-14 DIAGNOSIS — Z79899 Other long term (current) drug therapy: Secondary | ICD-10-CM | POA: Insufficient documentation

## 2015-03-14 DIAGNOSIS — Z9049 Acquired absence of other specified parts of digestive tract: Secondary | ICD-10-CM | POA: Insufficient documentation

## 2015-03-14 DIAGNOSIS — R0789 Other chest pain: Secondary | ICD-10-CM | POA: Diagnosis present

## 2015-03-14 HISTORY — PX: LEFT AND RIGHT HEART CATHETERIZATION WITH CORONARY ANGIOGRAM: SHX5449

## 2015-03-14 SURGERY — LEFT AND RIGHT HEART CATHETERIZATION WITH CORONARY ANGIOGRAM
Anesthesia: LOCAL

## 2015-03-14 MED ORDER — LIDOCAINE HCL (PF) 1 % IJ SOLN
INTRAMUSCULAR | Status: AC
  Filled 2015-03-14: qty 30

## 2015-03-14 MED ORDER — SODIUM CHLORIDE 0.9 % IJ SOLN: 3.0000 mL | INTRAMUSCULAR | Status: DC | PRN

## 2015-03-14 MED ORDER — SODIUM CHLORIDE 0.9 % IJ SOLN: 3.0000 mL | Freq: Two times a day (BID) | INTRAMUSCULAR | Status: DC

## 2015-03-14 MED ORDER — NITROGLYCERIN 1 MG/10 ML FOR IR/CATH LAB
INTRA_ARTERIAL | Status: AC
  Filled 2015-03-14: qty 10

## 2015-03-14 MED ORDER — ASPIRIN 81 MG PO CHEW
CHEWABLE_TABLET | ORAL | Status: AC
  Administered 2015-03-14: 81 mg via ORAL
  Filled 2015-03-14: qty 1

## 2015-03-14 MED ORDER — FENTANYL CITRATE 0.05 MG/ML IJ SOLN
INTRAMUSCULAR | Status: AC
  Filled 2015-03-14: qty 2

## 2015-03-14 MED ORDER — HEPARIN SODIUM (PORCINE) 1000 UNIT/ML IJ SOLN
INTRAMUSCULAR | Status: AC
  Filled 2015-03-14: qty 1

## 2015-03-14 MED ORDER — MIDAZOLAM HCL 2 MG/2ML IJ SOLN
INTRAMUSCULAR | Status: AC
  Filled 2015-03-14: qty 2

## 2015-03-14 MED ORDER — VERAPAMIL HCL 2.5 MG/ML IV SOLN
INTRAVENOUS | Status: AC
  Filled 2015-03-14: qty 2

## 2015-03-14 MED ORDER — HEPARIN (PORCINE) IN NACL 2-0.9 UNIT/ML-% IJ SOLN
INTRAMUSCULAR | Status: AC
Start: 2015-03-14 — End: 2015-03-14
  Filled 2015-03-14: qty 1000

## 2015-03-14 MED ORDER — SODIUM CHLORIDE 0.9 % IV SOLN
1.0000 mL/kg/h | INTRAVENOUS | Status: DC
  Administered 2015-03-14: 1 mL/kg/h via INTRAVENOUS

## 2015-03-14 MED ORDER — SODIUM CHLORIDE 0.9 % IV SOLN: 250.0000 mL | INTRAVENOUS | Status: DC | PRN

## 2015-03-14 MED ORDER — ASPIRIN 81 MG PO CHEW
81.0000 mg | CHEWABLE_TABLET | ORAL | Status: AC
  Administered 2015-03-14: 81 mg via ORAL

## 2015-03-14 NOTE — H&P (View-Only) (Signed)
      HPI: FU dyspnea and chest pain. BNP 11/15 20. Echo 11/15 showed normal LV function, grade 2 diastolic dysfunction. CTA 12/15 showed no PE, possible pulm HTN, coronary atherosclerosis. ETT 12/15 showed poor exercise tolerance, no ST changes. Seen recently by pulmonary and lasix increased for diastolic CHF; Dr Vassie LollAlva questioned need for RHC. Patient continues to have significant dyspnea on exertion. No orthopnea, PND, pedal edema. Occasional chest tightness with anxiety. Not exertional.  Current Outpatient Prescriptions  Medication Sig Dispense Refill  . desloratadine (CLARINEX) 5 MG tablet Take 5 mg by mouth as needed.   12  . esomeprazole (NEXIUM) 40 MG capsule Take 40 mg by mouth daily at 12 noon.    . fluticasone (FLONASE) 50 MCG/ACT nasal spray Place 1 spray into the nose as needed.    . furosemide (LASIX) 20 MG tablet Take 1 tablet (20 mg total) by mouth daily. 90 tablet 3   No current facility-administered medications for this visit.     Past Medical History  Diagnosis Date  . GERD (gastroesophageal reflux disease)   . Arthritis   . Raynaud disease     hands-toes  . Wears glasses   . IBS (irritable bowel syndrome)     Past Surgical History  Procedure Laterality Date  . Cesarean section  [phone]  . Cholecystectomy  1996    lap choli  . Tubal ligation    . Vaginal hysterectomy  2010    [facility]  . Radial head arthroplasty Right 07/28/2014    Procedure: RIGHT RADIAL HEAD REPLACEMENT;  Surgeon: Dairl PonderMatthew Weingold, MD;  Location: [facility];  Service: Orthopedics;  Laterality: Right;    History   Social History  . Marital Status: Married    Spouse Name: N/A  . Number of Children: 2  . 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 Smoking status: Never Smoker   . Smokeless tobacco: Not on file  . Alcohol Use: 0.0 oz/week    0 Standard drinks or equivalent per week     Comment: occ  . Drug Use: No  .  Sexual Activity: Not on file   Other Topics Concern  . Not on file   Social History Narrative    ROS: no fevers or chills, productive cough, hemoptysis, dysphasia, odynophagia, melena, hematochezia, dysuria, hematuria, rash, seizure activity, orthopnea, PND, pedal edema, claudication. Remaining systems are negative.  Physical Exam: Well-developed obesity in no acute distress.  Skin is warm and dry.  HEENT is normal.  Neck is supple.  Chest is clear to auscultation with normal expansion.  Cardiovascular exam is regular rate and rhythm.  Abdominal exam nontender or distended. No masses palpated. Extremities show no edema. neuro grossly intact

## 2015-03-14 NOTE — Progress Notes (Signed)
TR BAND REMOVAL  LOCATION:    right radial  DEFLATED PER PROTOCOL:    Yes.    TIME BAND OFF / DRESSING APPLIED:    1215   SITE UPON ARRIVAL:    Level 0  SITE AFTER BAND REMOVAL:    Level 0  CIRCULATION SENSATION AND MOVEMENT:    Within Normal Limits   Yes.    COMMENTS:   TRB REMOVED/ TEGADERM DSG APPLIED.

## 2015-03-14 NOTE — Discharge Instructions (Signed)
Radial Site Care °Refer to this sheet in the next few weeks. These instructions provide you with information on caring for yourself after your procedure. Your caregiver may also give you more specific instructions. Your treatment has been planned according to current medical practices, but problems sometimes occur. Call your caregiver if you have any problems or questions after your procedure. °HOME CARE INSTRUCTIONS °· You may shower the day after the procedure. Remove the bandage (dressing) and gently wash the site with plain soap and water. Gently pat the site dry. °· Do not apply powder or lotion to the site. °· Do not submerge the affected site in water for 3 to 5 days. °· Inspect the site at least twice daily. °· Do not flex or bend the affected arm for 24 hours. °· No lifting over 5 pounds (2.3 kg) for 5 days after your procedure. °· Do not drive home if you are discharged the same day of the procedure. Have someone else drive you. °· You may drive 24 hours after the procedure unless otherwise instructed by your caregiver. °· Do not operate machinery or power tools for 24 hours. °· A responsible adult should be with you for the first 24 hours after you arrive home. °What to expect: °· Any bruising will usually fade within 1 to 2 weeks. °· Blood that collects in the tissue (hematoma) may be painful to the touch. It should usually decrease in size and tenderness within 1 to 2 weeks. °SEEK IMMEDIATE MEDICAL CARE IF: °· You have unusual pain at the radial site. °· You have redness, warmth, swelling, or pain at the radial site. °· You have drainage (other than a small amount of blood on the dressing). °· You have chills. °· You have a fever or persistent symptoms for more than 72 hours. °· You have a fever and your symptoms suddenly get worse. °· Your arm becomes pale, cool, tingly, or numb. °· You have heavy bleeding from the site. Hold pressure on the site. °Document Released: 01/12/2011 Document Revised:  03/03/2012 Document Reviewed: 01/12/2011 °ExitCare® Patient Information ©2015 [employer]. This information is not intended to replace advice given to you by your health care provider. Make sure you discuss any questions you have with your health care provider. ° °

## 2015-03-14 NOTE — Telephone Encounter (Signed)
Patient has seen Dr. Titus Dubinevashwar in the past and wanted to know if it is okay to get referral for her instead of Beekman/ Durenda Agendersen?  Also, patient would like to know more information about the Cath.  What does pulmonary pressure on cath mean?    Please advise.

## 2015-03-14 NOTE — Interval H&P Note (Signed)
History and Physical Interval Note:  03/14/2015 8:55 AM  [name]  has presented today for surgery, with the diagnosis of dysmia  The various methods of treatment have been discussed with the patient and family. After consideration of risks, benefits and other options for treatment, the patient has consented to  Procedure(s): LEFT AND RIGHT HEART CATHETERIZATION WITH CORONARY ANGIOGRAM (N/A) as a surgical intervention .  The patient's history has been reviewed, patient examined, no change in status, stable for surgery.  I have reviewed the patient's chart and labs.  Questions were answered to the patient's satisfaction.   Cath Lab Visit (complete for each Cath Lab visit)  Clinical Evaluation Leading to the Procedure:   ACS: No.  Non-ACS:    Anginal Classification: CCS III  Anti-ischemic medical therapy: No Therapy  Non-Invasive Test Results: Intermediate-risk stress test findings: cardiac mortality 1-3%/year  Prior CABG: No previous CABG        Theron Aristaeter [address] Fourth [address] LLCJordanMD,FACC 03/14/2015 8:55 AM

## 2015-03-14 NOTE — CV Procedure (Signed)
    Cardiac Catheterization Procedure Note  Name: Burley Saverngela D Misko MRN: [id number] DOB: [birth year]  Procedure: Right Heart Cath, Left Heart Cath, Selective Coronary Angiography, LV angiography  Indication: 49 yo WF with dyspnea on exertion.    Procedural Details: The right wrist was prepped, draped, and anesthetized with 1% lidocaine. Using the modified Seldinger technique a 6 Fr slender sheath was placed in the right radial artery and a 5 French sheath was placed in the right brachial vein. A Swan-Ganz catheter was used for the right heart catheterization. Standard protocol was followed for recording of right heart pressures and sampling of oxygen saturations. Fick cardiac output was calculated. Standard Judkins catheters were used for selective coronary angiography and left ventriculography. There were no immediate procedural complications. The patient was transferred to the post catheterization recovery area for further monitoring.  Procedural Findings: Hemodynamics RA 13/12 mean 10 mm Hg RV 53/11 mm Hg PA 50/19 mean 33 mm Hg  PCWP 18/16 mean 14 mm Hg LV 169/13/22 mm Hg  AO 165/92 mean 119 mm Hg  Oxygen saturations: PA 73% AO 95%  Cardiac Output (Fick) 6.9 L/min  Cardiac Index (Fick) 3.2 L/min/meter squared   Coronary angiography: Coronary dominance: right  Left mainstem: Normal  Left anterior descending (LAD): Minimal irregularities less than 10%  Left circumflex (LCx): Normal  Right coronary artery (RCA): Minimal irregularities less than 10%   Left ventriculography: Left ventricular systolic function is normal, LVEF is estimated at 55-65%, there is no significant mitral regurgitation   Final Conclusions:   1. No significant CAD 2. Normal LV function 3. Moderate pulmonary HTN with normal LV filling pressures.  Recommendations: Continue current diuretic dose. Medical management.   Chelsie Burel [place]Jordan, MDFACC 03/14/2015, 9:47 AM

## 2015-03-15 LAB — POCT I-STAT 3, ART BLOOD GAS (G3+)
ACID-BASE DEFICIT: 4 mmol/L — AB (ref 0.0–2.0)
BICARBONATE: 19.8 meq/L — AB (ref 20.0–24.0)
O2 SAT: 95 %
TCO2: 21 mmol/L (ref 0–100)
pCO2 arterial: 30.6 mmHg — ABNORMAL LOW (ref 35.0–45.0)
pH, Arterial: 7.418 (ref 7.350–7.450)
pO2, Arterial: 71 mmHg — ABNORMAL LOW (ref 80.0–100.0)

## 2015-03-15 LAB — POCT I-STAT 3, VENOUS BLOOD GAS (G3P V)
Acid-base deficit: 3 mmol/L — ABNORMAL HIGH (ref 0.0–2.0)
BICARBONATE: 20.9 meq/L (ref 20.0–24.0)
O2 Saturation: 73 %
PCO2 VEN: 33.2 mmHg — AB (ref 45.0–50.0)
PH VEN: 7.406 — AB (ref 7.250–7.300)
PO2 VEN: 37 mmHg (ref 30.0–45.0)
TCO2: 22 mmol/L (ref 0–100)

## 2015-03-15 NOTE — Telephone Encounter (Signed)
Ok for Marriottdeveshwar referral

## 2015-03-17 ENCOUNTER — Telehealth: Payer: Self-pay | Admitting: Pulmonary Disease

## 2015-03-17 NOTE — Telephone Encounter (Signed)
Referral to Dr. Titus Dubinevashwar entered.  Patient aware. Nothing further needed.

## 2015-03-17 NOTE — Telephone Encounter (Signed)
Pt had heart cath done and would like RA's interpretation of this.  RA - please advise. Thanks.

## 2015-03-21 NOTE — Telephone Encounter (Signed)
Pt returning call.Rachael Camacho ° °

## 2015-03-21 NOTE — Telephone Encounter (Signed)
Patient can be reached on her cell at [phone]

## 2015-03-21 NOTE — Telephone Encounter (Signed)
Have tried a couple of times to call her - but goes to voicemail Can she provide a number I can reach her at ?

## 2015-03-22 NOTE — Telephone Encounter (Signed)
Paged Dr Vassie LollAlva to let him know pt can be reached at [phone]

## 2015-03-22 NOTE — Telephone Encounter (Signed)
Discussed finding of pul hypertension PVR not calculated Await rheum input from Deveshwar - then can decide about PAH therapy

## 2015-03-26 ENCOUNTER — Encounter: Payer: Self-pay | Admitting: Dietician

## 2015-03-26 ENCOUNTER — Encounter: Payer: BLUE CROSS/BLUE SHIELD | Attending: Surgery | Admitting: Dietician

## 2015-03-26 DIAGNOSIS — Z6841 Body Mass Index (BMI) 40.0 and over, adult: Secondary | ICD-10-CM | POA: Insufficient documentation

## 2015-03-26 DIAGNOSIS — Z713 Dietary counseling and surveillance: Secondary | ICD-10-CM | POA: Insufficient documentation

## 2015-03-26 NOTE — Progress Notes (Signed)
  Pre-Op Assessment Visit:  Pre-Operative Sleeve Gastrectomy Surgery  Medical Nutrition Therapy:  Appt start time: 0910  End time:  1005.  Patient was seen on 03/26/2015 for Pre-Operative Nutrition Assessment. Assessment and [facility] on 03/26/2015.   Preferred Learning Style:   No preference indicated   Learning Readiness:   Contemplating  Handouts given during visit include:  Pre-Op Goals Bariatric Surgery Protein Shakes Bariatric Snacks   During the appointment today the following Pre-Op Goals were reviewed with the patient: Maintain or lose weight as instructed by your surgeon Make healthy food choices Begin to limit portion sizes Limited concentrated sugars and fried foods Keep fat/sugar in the single digits per serving on   food labels Practice CHEWING your food  (aim for 30 chews per bite or until applesauce consistency) Practice not drinking 15 minutes before, during, and 30 minutes after each meal/snack Avoid all carbonated beverages  Avoid/limit caffeinated beverages  Avoid all sugar-sweetened beverages Consume 3 meals per day; eat every 3-5 hours Make a list of non-food related activities Aim for 64-100 ounces of FLUID daily  Aim for at least 60-80 grams of PROTEIN daily Look for a liquid protein source that contain ?15 g protein and ?5 g carbohydrate  (ex: shakes, drinks, shots)  Patient-Centered Goals: Rachael Landngela would like to be healthy and active.  9-10 Level of importance/5-8 level of confidence  Demonstrated degree of understanding via:  Teach Back  Teaching Method Utilized:  Visual Auditory Hands on  Barriers to learning/adherence to lifestyle change: emotional eating  [facility] to call the [facility] to enroll in Pre-Op and Post-Op Nutrition Education when surgery date is scheduled.

## 2015-03-26 NOTE — Patient Instructions (Signed)

## 2015-04-03 ENCOUNTER — Ambulatory Visit (HOSPITAL_BASED_OUTPATIENT_CLINIC_OR_DEPARTMENT_OTHER): Payer: BLUE CROSS/BLUE SHIELD | Attending: Pulmonary Disease

## 2015-04-03 VITALS — Ht 64.0 in | Wt 250.0 lb

## 2015-04-03 DIAGNOSIS — G4733 Obstructive sleep apnea (adult) (pediatric): Secondary | ICD-10-CM | POA: Diagnosis present

## 2015-04-03 DIAGNOSIS — G4734 Idiopathic sleep related nonobstructive alveolar hypoventilation: Secondary | ICD-10-CM | POA: Insufficient documentation

## 2015-04-04 ENCOUNTER — Other Ambulatory Visit: Payer: Self-pay | Admitting: Obstetrics and Gynecology

## 2015-04-05 LAB — CYTOLOGY - PAP

## 2015-04-05 NOTE — Progress Notes (Signed)
      HPI: FU dyspnea and chest pain. BNP 11/15 20. Echo 11/15 showed normal LV function, grade 2 diastolic dysfunction. CTA 12/15 showed no PE, possible pulm HTN, coronary atherosclerosis. ETT 12/15 showed poor exercise tolerance, no ST changes. Cardiac catheterization March 2016 showed right atrial pressure 10, pulmonary capillary wedge pressure 14 and PA pressure 50/19. No obstructive coronary disease noted. Ejection fraction 55-65%. Since last seen she continues to have dyspnea on exertion but no orthopnea or PND. Chronic mild pedal edema. No chest pain or syncope.  Current Outpatient Prescriptions  Medication Sig Dispense Refill  . aspirin 81 MG tablet Take 81 mg by mouth daily.    [name]. desloratadine (CLARINEX) 5 MG tablet Take 5 mg by mouth as needed.   12  . esomeprazole (NEXIUM) 40 MG capsule Take 40 mg by mouth daily at 12 noon.    . fluticasone (FLONASE) 50 MCG/ACT nasal spray Place 1 spray into the nose as needed.    . furosemide (LASIX) 20 MG tablet Take 1 tablet (20 mg total) by mouth daily. 90 tablet 3   No current facility-administered medications for this visit.     Past Medical History  Diagnosis Date  . GERD (gastroesophageal reflux disease)   . Arthritis   . Raynaud disease     hands-toes  . Wears glasses   . IBS (irritable bowel syndrome)   . Sleep apnea     Past Surgical History  Procedure Laterality Date  . Cesarean section  [phone]  . Cholecystectomy  1996    lap choli  . Tubal ligation    . Vaginal hysterectomy  2010    [facility]  . Radial head arthroplasty Right 07/28/2014    Procedure: RIGHT RADIAL HEAD REPLACEMENT;  Surgeon: Dairl PonderMatthew Weingold, MD;  Location: [facility];  Service: Orthopedics;  Laterality: Right;  . Left and right heart catheterization with coronary angiogram N/A 03/14/2015    Procedure: LEFT AND RIGHT HEART CATHETERIZATION WITH CORONARY ANGIOGRAM;  Surgeon: Peter M SwazilandJordan, MD;  [facility];  Service: Cardiovascular;   Laterality: N/A;    History   Social History  . Marital Status: Married    Spouse Name: N/A  . Number of Children: 2  . 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 Smoking status: Never Smoker   . Smokeless tobacco: Not on file  . Alcohol Use: 0.0 oz/week    0 Standard drinks or equivalent per week     Comment: occ  . Drug Use: No  . Sexual Activity: Not on file   Other Topics Concern  . Not on file   Social History Narrative    ROS: no fevers or chills, productive cough, hemoptysis, dysphasia, odynophagia, melena, hematochezia, dysuria, hematuria, rash, seizure activity, orthopnea, PND, pedal edema, claudication. Remaining systems are negative.  Physical Exam: Well-developed obese in no acute distress.  Skin is warm and dry.  HEENT is normal.  Neck is supple.  Chest is clear to auscultation with normal expansion.  Cardiovascular exam is regular rate and rhythm.  Abdominal exam nontender or distended. No masses palpated. Extremities show no edema. neuro grossly intact

## 2015-04-08 ENCOUNTER — Encounter: Payer: Self-pay | Admitting: Cardiology

## 2015-04-08 ENCOUNTER — Ambulatory Visit (INDEPENDENT_AMBULATORY_CARE_PROVIDER_SITE_OTHER): Payer: BLUE CROSS/BLUE SHIELD | Admitting: Cardiology

## 2015-04-08 VITALS — BP 136/82 | HR 90 | Ht 64.0 in | Wt 253.3 lb

## 2015-04-08 DIAGNOSIS — G4733 Obstructive sleep apnea (adult) (pediatric): Secondary | ICD-10-CM | POA: Diagnosis not present

## 2015-04-08 DIAGNOSIS — R0600 Dyspnea, unspecified: Secondary | ICD-10-CM

## 2015-04-08 NOTE — Assessment & Plan Note (Signed)
Patient is being considered for bariatric surgery.

## 2015-04-08 NOTE — Assessment & Plan Note (Signed)
Management per pulmonary. 

## 2015-04-08 NOTE — Patient Instructions (Signed)
Your physician recommends that you schedule a follow-up appointment in: AS NEEDED  

## 2015-04-08 NOTE — Assessment & Plan Note (Signed)
Patient continues to complain of dyspnea. Etiology is unclear. Echocardiogram showed normal LV function and grade 2 diastolic dysfunction. Diuresis did not improve symptoms. Previous chest CT showed no pulmonary embolus but there was note of coronary atherosclerosis. Cardiac catheterization revealed no obstructive coronary disease, normal left-sided pressures and moderate pulmonary hypertension. Question if dyspnea is related to obstructive sleep apnea, deconditioning and obesity hypoventilation syndrome. Pulmonary is evaluating and has ordered rheumatologic labs. I will leave further evaluation and therapy of pulmonary hypertension to them.

## 2015-04-11 ENCOUNTER — Ambulatory Visit: Payer: BLUE CROSS/BLUE SHIELD | Admitting: Adult Health

## 2015-04-11 ENCOUNTER — Telehealth: Payer: Self-pay | Admitting: Pulmonary Disease

## 2015-04-11 DIAGNOSIS — G4733 Obstructive sleep apnea (adult) (pediatric): Secondary | ICD-10-CM

## 2015-04-11 DIAGNOSIS — G4730 Sleep apnea, unspecified: Secondary | ICD-10-CM | POA: Diagnosis not present

## 2015-04-11 NOTE — Telephone Encounter (Signed)
Lmtcb.

## 2015-04-11 NOTE — Telephone Encounter (Signed)
CPAP can be initiated at 7 centimeters with a small nasal pillows mask  Prescriptions sent to DME Please arrange office visit in 4-6 weeks with download

## 2015-04-11 NOTE — Sleep Study (Signed)
[facility]  NAME: Rachael Camacho  DATE OF BIRTH: 26-Oct-1966  MEDICAL RECORD NUMBER [id number]  LOCATION: [facility]  PHYSICIAN: Matas Burrows V.  DATE OF STUDY: 04/11/2015   SLEEP STUDY TYPE: CPAP titration study               REFERRING PHYSICIAN: Oretha MilchAlva, Devanie Galanti V, MD  INDICATION FOR STUDY: 49 year old with Raynaud's disease, pulmonary hypertension and mild OSA. Home sleep study showed AHI of 11 per hour with lowest desaturation of 70% At the time of this study ,they weighed 250 pounds with a height of  5 ft 4 inches and the BMI of 43, neck size of 16 inches. Epworth sleepiness score was 11   This CPAP titration polysomnogram was performed with a sleep technologist in attendance. EEG, EOG,EMG and respiratory parameters recorded. Sleep stages, arousals, limb movements and respiratory data was scored according to criteria laid out by the American Academy of sleep medicine.  SLEEP ARCHITECTURE: Lights out was at 2247 PM and lights on was at 457 AM. Total sleep time was 266 minutes with sleep period time of 322 minutes and sleep efficiency of 72 .Sleep latency was 48 minutes with latency to REM sleep of 188 minutes and wake after sleep onset of 55 minutes.  Sleep stages as a percentage of total sleep time was N1 11% N2- 65 % and REM sleep 16 % ( 43 minutes) . The longest period of REM sleep was around 3 AM.   AROUSAL DATA : There were 60 arousals with an arousal index of 13 events per hour. Of these 57 were spontaneous, and 2 were associated with respiratory events and 1 were associated periodic limb movements  RESPIRATORY DATA: CPAP was initiated at 4 centimeters and titrated to a final level of 7 centimeters due to respiratory events and snoring. At the final level of 7 centimeters, there were 0 obstructive apneas, 0 central apneas, 0 mixed apneas and 0 hypopneas with apnea -hypopnea index of 0 events per hour.  There was no relation to sleep stage or  body position. Titration was optimal.  MOVEMENT/PARASOMNIA: There were 18 PLMS with a PLM index of 4 events per hour. The PLM arousal index was 0.2 events per hour.  OXYGEN DATA: The lowest desaturation was 82 % during REM sleep and the desaturation index was 4 per hour. The saturations stayed below 88% for 23 minutes.  CARDIAC DATA: The low heart rate was 35 beats per minute. The high heart rate recorded was an artifact. No arrhythmias were noted   IMPRESSION :  1. Mild obstructive sleep apnea with hypopneas causing sleep fragmentation and mild oxygen desaturation. 2. This was corrected by CPAP of 7 centimeters with a small nasal pillows mask. Titration was optimal, mild hypoxia persisted at the final level in spite of good control of events. 3. No evidence of cardiac arrhythmias or behavioral disturbance during sleep. 4. Periodic limb movements were not significant.  RECOMMENDATION:    1. The treatment options for this degree of sleep disordered breathing includes weight loss and/or CPAP therapy. CPAP can be initiated at 7 centimeters with a small nasal pillows mask and compliance monitored at this level. 2. Patient should be cautioned against driving when sleepy 3. They should be asked to avoid medications with sedative side effects  Oretha MilchALVA,Daniela Hernan V.  MD Diplomate, American Board of Sleep Medicine  ELECTRONICALLY SIGNED ON:  04/11/2015  [facility] PH: [phone]   FX: [phone]  [phone] ACCREDITED BY THE AMERICAN ACADEMY OF SLEEP MEDICINE

## 2015-04-12 NOTE — Telephone Encounter (Signed)
lmtcb

## 2015-04-12 NOTE — Telephone Encounter (Signed)
Lmtcb.  Ok for front desk to tell patient that order has been placed for CPAP machine and schedule follow up appointment with RA for 4-6 weeks.

## 2015-04-14 NOTE — Telephone Encounter (Signed)
Patient notified.  Scheduled for 6 week follow up with Dr. Vassie LollAlva. Nothing further needed.

## 2015-04-15 ENCOUNTER — Ambulatory Visit: Payer: BLUE CROSS/BLUE SHIELD | Admitting: Adult Health

## 2015-04-25 ENCOUNTER — Encounter: Payer: BLUE CROSS/BLUE SHIELD | Attending: Family Medicine | Admitting: Dietician

## 2015-04-25 DIAGNOSIS — Z6841 Body Mass Index (BMI) 40.0 and over, adult: Secondary | ICD-10-CM | POA: Insufficient documentation

## 2015-04-25 DIAGNOSIS — Z713 Dietary counseling and surveillance: Secondary | ICD-10-CM | POA: Insufficient documentation

## 2015-04-27 ENCOUNTER — Encounter: Payer: BLUE CROSS/BLUE SHIELD | Admitting: Dietician

## 2015-04-27 DIAGNOSIS — Z6841 Body Mass Index (BMI) 40.0 and over, adult: Secondary | ICD-10-CM | POA: Diagnosis not present

## 2015-04-27 DIAGNOSIS — Z713 Dietary counseling and surveillance: Secondary | ICD-10-CM | POA: Diagnosis not present

## 2015-04-27 NOTE — Patient Instructions (Signed)
-   200% of a complete multivitamin (chewable, not gummy) - 1500 mg of Calcium citrate (500 mg 3x a day) *Try the soft chews by Celebrate or Bariat[employer] - Sublingual vitamin B12

## 2015-04-27 NOTE — Progress Notes (Signed)
  Medical Nutrition Therapy:  Appt start time: 1020 end time:  1055.  Follow up:  Primary concerns today: Rachael Camacho returns today to follow up after initial assessment. She has been practicing the pre op goals and has been considering what her diet will be like following sleeve gastrectomy surgery. She started having a protein shake for breakfast instead of skipping. She reports concerns regarding lack of support from her family. She also has concerns about irritable bowel syndrome and how the surgery will affect IBS. She has done extensive reading and follows up with her psychiatrist regularly.    MEDICATIONS: see list  DIETARY INTAKE: did not assess today  Progress Towards Goal(s):  In progress.   Nutritional Diagnosis:  [place]-3.3 Overweight/obesity related to past poor dietary habits and physical inactivity as evidenced by patient w/ pending gastric sleeve surgery following dietary guidelines for continued weight loss.     Intervention:  Nutrition education provided. Answered patient's questions regarding bariatric surgery. Encouraged her to include her family in nutrition appointments.   Monitoring/Evaluation:  Patient to attend pre op class and follow up nutrition appointments.

## 2015-05-20 ENCOUNTER — Encounter: Payer: Self-pay | Admitting: Pulmonary Disease

## 2015-05-20 ENCOUNTER — Ambulatory Visit (INDEPENDENT_AMBULATORY_CARE_PROVIDER_SITE_OTHER): Payer: BLUE CROSS/BLUE SHIELD | Admitting: Pulmonary Disease

## 2015-05-20 VITALS — BP 100/64 | HR 92 | Ht 65.0 in | Wt 249.6 lb

## 2015-05-20 DIAGNOSIS — I270 Primary pulmonary hypertension: Secondary | ICD-10-CM

## 2015-05-20 DIAGNOSIS — I272 Pulmonary hypertension, unspecified: Secondary | ICD-10-CM

## 2015-05-20 DIAGNOSIS — G4733 Obstructive sleep apnea (adult) (pediatric): Secondary | ICD-10-CM | POA: Diagnosis not present

## 2015-05-20 NOTE — Progress Notes (Signed)
   Subjective:    Patient ID: Rachael Camacho, female    DOB: [birth year], 49 y.o.   MRN: [id number]  HPI  49 year old never smoker for FU of DOE & OSA. She had oxygen desaturation on exertion She was initially seen as part of the pediatric evaluation  Saw Dr Corliss Skainsdeveshwar in the past for raynauds', arthritis  Chief Complaint  Patient presents with  . Sleep Apnea    CPAP working well.  Wearing every night.  no concerns.   She is tolerating CPAP well, feels better rested, seems to sleep better. 04/2015 11 download on 7 cm >> no residuals, minimal leak, good usage more than 6 hours She has been evaluated by rheumatology-scleroderma being considered- right heart cath advised She is proceeding with bariatric evaluation  Significant tests/ events  Echo 11/12/14 showed grade 2 diastolic dysfunction, with normal systolic function CT angio 11/2014 was negative for pulmonary embolism, but showed mosaic pattern suggestive of early interstitial edema >> no response to Lasix  01/2015 saturation about 93%-on walking she desaturate to 84%  Home sleep study 02/22/15 >> AHI 10.6, SaO2 low 70%. She spent 329.7 min (84.7% of test time) with SaO2 < 90%. Consistent with mild sleep apnea   PFTs 03/09/15 no obstruction, mild restriction TLC 78%, DLCO 52%  RIght heart cath >> 02/2015 -right atrial pressure 10, pulmonary capillary wedge pressure 14 and PA pressure 50/19. PVR calculates to 2.7 WU (normal range). PA pressures are up due to high output and pulmonary vasodialtors not indicated.  Rheum consult >> Nickola Major([place]) ? scleroderma   Past Medical History  Diagnosis Date  . GERD (gastroesophageal reflux disease)   . Arthritis   . Raynaud disease     hands-toes  . Wears glasses   . IBS (irritable bowel syndrome)   . Sleep apnea     Review of Systems neg for any significant sore throat, dysphagia, itching, sneezing, nasal congestion or excess/ purulent secretions, fever, chills, sweats, unintended wt  loss, pleuritic or exertional cp, hempoptysis, orthopnea pnd or change in chronic leg swelling. Also denies presyncope, palpitations, heartburn, abdominal pain, nausea, vomiting, diarrhea or change in bowel or urinary habits, dysuria,hematuria, rash, arthralgias, visual complaints, headache, numbness weakness or ataxia.     Objective:   Physical Exam  Gen. Pleasant, obese, in no distress ENT - no lesions, no post nasal drip Neck: No JVD, no thyromegaly, no carotid bruits Lungs: no use of accessory muscles, no dullness to percussion, decreased without rales or rhonchi  Cardiovascular: Rhythm regular, heart sounds  normal, no murmurs or gallops, no peripheral edema Musculoskeletal: No deformities, no cyanosis or clubbing , no tremors       Assessment & Plan:

## 2015-05-20 NOTE — Patient Instructions (Signed)
Check oxygen level on CPAP during sleep Referral to Dr bensimhon for pulmonary hypertension You have mild obstructive sleep apnea , corrected by CPAP 7 cm

## 2015-05-21 DIAGNOSIS — I272 Pulmonary hypertension, unspecified: Secondary | ICD-10-CM | POA: Insufficient documentation

## 2015-05-21 NOTE — Assessment & Plan Note (Signed)
I do not think that she needs another right heart cath. Her pulmonary vascular resistance calculates in the high normal range, so pulmonary vasodilatory is not indicated at the current time. No recent for high pulmonary flow-no anemia or thyrotoxicosis. Hopefully treating OSA will help. Reassess with echo at some point

## 2015-05-21 NOTE — Assessment & Plan Note (Signed)
CPAP 7 cm seems to clear respiratory events Weight loss encouraged, compliance with goal of at least 4-6 hrs every night is the expectation. Advised against medications with sedative side effects Cautioned against driving when sleepy - understanding that sleepiness will vary on a day to day basis

## 2015-05-24 ENCOUNTER — Telehealth (HOSPITAL_COMMUNITY): Payer: Self-pay | Admitting: Cardiology

## 2015-05-24 NOTE — Telephone Encounter (Signed)
-----   Message from Dolores Pattyaniel R Bensimhon, MD sent at 05/20/2015  8:56 PM EDT ----- Will do. May be a couple weeks as we are a bit backed up but will get her in.   Chantel - can you schedule her for me.. Thanks -db   ----- Message -----    From: Oretha Milchakesh Alva V, MD    Sent: 05/20/2015  11:46 AM      To: Dolores Pattyaniel R Bensimhon, MD  Can you see her pl - have made referral  Prob scleroderma being diagnosed  Kathy Breachakesh ----- Message -----    From: Dolores Pattyaniel R Bensimhon, MD    Sent: 03/24/2015   7:42 PM      To: Oretha Milchakesh Alva V, MD  PVR calculates to 2.7 WU (normal range). PA pressures are up due to high output and not a specific pulmonary vasculopathy so pulmonary vasodialtors not indicated.  Etiologies typically are shunt (they did not assess for this), hyperthyroid, anemia or liver disease.   Does that help?  I will need to review chart further. Happy to see her at some point if needed.    ----- Message -----    From: Oretha Milchakesh Alva V, MD    Sent: 03/24/2015   7:28 PM      To: Dolores Pattyaniel R Bensimhon, MD  Can you review cath data & tell me what you think? ANA + PVR not done  [place]

## 2015-05-26 NOTE — Telephone Encounter (Signed)
Pt scheduled for new pt appt on 6/21 at 940

## 2015-06-08 ENCOUNTER — Telehealth: Payer: Self-pay | Admitting: Pulmonary Disease

## 2015-06-08 DIAGNOSIS — G4733 Obstructive sleep apnea (adult) (pediatric): Secondary | ICD-10-CM

## 2015-06-08 NOTE — Telephone Encounter (Signed)
Add 1 L 02 during sleep Repeat ONO

## 2015-06-09 NOTE — Telephone Encounter (Signed)
Patient notified.  Order entered to add 1L to CPAP Order entered to repeat ONO on 1 L CPAP

## 2015-06-09 NOTE — Telephone Encounter (Signed)
Left message to call back  

## 2015-06-13 ENCOUNTER — Ambulatory Visit: Payer: BLUE CROSS/BLUE SHIELD | Admitting: Pulmonary Disease

## 2015-06-14 ENCOUNTER — Ambulatory Visit (HOSPITAL_COMMUNITY)
Admission: RE | Admit: 2015-06-14 | Discharge: 2015-06-14 | Disposition: A | Discharge status: Home / Self Care | Payer: BLUE CROSS/BLUE SHIELD | Source: Ambulatory Visit | Attending: Internal Medicine | Admitting: Internal Medicine

## 2015-06-14 ENCOUNTER — Telehealth: Payer: Self-pay | Admitting: Pulmonary Disease

## 2015-06-14 ENCOUNTER — Encounter (HOSPITAL_COMMUNITY): Payer: Self-pay

## 2015-06-14 ENCOUNTER — Encounter: Payer: Self-pay | Admitting: Pulmonary Disease

## 2015-06-14 VITALS — BP 122/74 | HR 82 | Wt 252.5 lb

## 2015-06-14 DIAGNOSIS — R0600 Dyspnea, unspecified: Secondary | ICD-10-CM

## 2015-06-14 DIAGNOSIS — I272 Other secondary pulmonary hypertension: Secondary | ICD-10-CM | POA: Insufficient documentation

## 2015-06-14 DIAGNOSIS — I270 Primary pulmonary hypertension: Secondary | ICD-10-CM

## 2015-06-14 NOTE — Patient Instructions (Signed)
Will schedule you for pulmonary VQ scan.  [facility] will contact you to set up home oxygen.  Follow up 3 months.  Do the following things EVERYDAY: 1) Weigh yourself in the morning before breakfast. Write it down and keep it in a log. 2) Take your medicines as prescribed 3) Eat low salt foods-Limit salt (sodium) to 2000 mg per day.  4) Stay as active as you can everyday 5) Limit all fluids for the day to less than 2 liters

## 2015-06-14 NOTE — Progress Notes (Signed)
Patient ID: Rachael Camacho, female   DOB: 1966/03/09, 49 y.o.   MRN: [id number] PCP: Primary Cardiologist: Dr Jens Som.  Pulmonary: Dr Vassie Loll.   HPI: Rachael Camacho is a 49 year old with a history of  OSA, chornic diastolic HF, obesity, scleroderma, and raynauds disease referred to HF clinic by Dr Vassie Loll.   A year ago she was exercising without difficulty. She was able to do ballroom dancing and walking on the treadmill a mile or two without difficulty. Last September she noticed dyspnea with steps. Had another function decline in October and had increased dyspnea walking a block.  Symptoms progressed in October and now SOB with minimal activity. She says her weight has been 250 pounds for the last 2 years.  Has been seen by Dr Vassie Loll and had PFTs as below. Also referred for RHC in 3/16 with mild PH but PVR 2.8 WU.[name] Started 20 mg po lasix in the fall of last year.   Today she was dyspneic walking back to the clinic. Complains of fatigue with ADLs. Started on CPAP 6 weeks ago. Does not feel like it helps.   Studies/Tests   01/2015- 93% at rest but non walking she desaturate to 84%   Echo 11/12/14 showed grade 2 diastolic dysfunction, with normal systolic function  CT angio 11/2014 was negative for pulmonary embolism, but showed mosaic pattern suggestive of early interstitial edema >> no response to Lasix  Home sleep study 02/22/15.  Consistent with mild sleep apnea Desaturation noted.   PFTs 03/09/15 no obstruction, mild restriction TLC 78%, DLCO 52% predicted  RHC [facility] 03/14/2015  RA 13/12 mean 10 mm Hg RV 53/11 mm Hg PA 50/19 mean 33 mm Hg  PCWP 18/16 mean 14 mm Hg LV 169/13/22 mm Hg  AO 165/92 mean 119 mm Hg Oxygen saturations: PA 73% AO 95% Cardiac Output (Fick) 6.9 L/min  Cardiac Index (Fick) 3.2 L/min/meter squared PVR = 2.8 WU No significant CAD    ROS: All systems negative except as listed in HPI, PMH and Problem List.  SH:  History   Social History  . Marital Status:  Married    Spouse Name: N/A  . Number of Children: 2  . 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 Smoking status: Never Smoker   . Smokeless tobacco: Not on file  . Alcohol Use: 0.0 oz/week    0 Standard drinks or equivalent per week     Comment: occ  . Drug Use: No  . Sexual Activity: Not on file   Other Topics Concern  . Not on file   Social History Narrative    FH:  Family History  Problem Relation Age of Onset  . Heart failure Mother     Stent put in 2014 for blockage  . COPD Mother   . Heart disease Father   . Heart disease Son     Repair ASD    Past Medical History  Diagnosis Date  . GERD (gastroesophageal reflux disease)   . Arthritis   . Raynaud disease     hands-toes  . Wears glasses   . IBS (irritable bowel syndrome)   . Sleep apnea     Current Outpatient Prescriptions  Medication Sig Dispense Refill  . aspirin 81 MG tablet Take 81 mg by mouth daily.    [name] desloratadine (CLARINEX) 5 MG tablet Take 5 mg by mouth as needed.  12  . esomeprazole (NEXIUM) 40 MG capsule Take 40 mg by mouth daily at 12 noon.    . fluticasone (FLONASE) 50 MCG/ACT nasal spray Place 1 spray into the nose as needed.    . furosemide (LASIX) 20 MG tablet Take 1 tablet (20 mg total) by mouth daily. 90 tablet 3   No current facility-administered medications for this encounter.    Filed Vitals:   06/14/15 1121  BP: 122/74  Pulse: 82  Weight: 252 lb 8 oz (114.533 kg)  SpO2: 98%    PHYSICAL EXAM:  General:  Well appearing. Dyspneic with minimal activity. Desats with hall walk (measured with forehead probe)  HEENT: normal Neck: supple. JVP 7-8 Carotids 2+ bilaterally; no bruits. No lymphadenopathy or thryomegaly appreciated. Cor: PMI normal. Regular rate & rhythm. No rubs, gallops or murmurs. Lungs: clear Abdomen: obese soft, nontender, nondistended. No hepatosplenomegaly. No bruits or masses. Good bowel  sounds. Extremities: no cyanosis, clubbing, rash, trace -1+ edema Neuro: alert & orientedx3, cranial nerves grossly intact. Moves all 4 extremities w/o difficulty. Affect pleasant   ASSESSMENT & PLAN:  1. Pulmonary HTN 2. Chronic Diastolic HF 3. OSA - CPAP per Dr Vassie Loll 4. Scleroderma 5. Chronic respiratory failure with exertional desaturations  Difficult case. RHC cath numbers show mild PH with normal PCWP and normal PVR (high output). She has no clear reasons to have high output. (not anemic, TSH ok)  PFTs with essentially normal spirometry but significantly reduced DLCO. CT chest in 12/15 without PE or overt fibrosis but has mosaic appearance that can be seen with chronic PE or possible early interstitial disease. On exam, she desats quickly. Given constellation of findings I remain concerned about developing PAH or parenchymal problem. PVOD also a consideration but chest CT not classic. Will order VQ to exclude chronic PE. Will also start supplemental oxygen. Will need echo with bubble study to exclude shunt. I will plan on seeing her back in near future and will likely repeat her RHC. She will need close f/u.  Hurley Sobel,MD 11:17 PM

## 2015-06-14 NOTE — Progress Notes (Signed)
SATURATION QUALIFICATIONS: (This note is used to comply with regulatory documentation for home oxygen)  Patient Saturations on Room Air at Rest = 91%  Patient Saturations on Room Air while Ambulating = 87%  Patient Saturations on 2 Liters of oxygen while Ambulating = 95%  Please briefly explain why patient needs home oxygen: Patient quickly desats with ambulation accompanied by shortness of breath.  Patient tolerates walking with 2L continuous O2 via [place] will.

## 2015-06-14 NOTE — Telephone Encounter (Signed)
Pt reports she saw Dr. Gala Romney today. He agree's for her to start O2 and patient wants [place] to order this. I advised her pt per 6/16 phone note she spoke with RA's nurse and he was starting her on 1 liter of O2 qhs W/ cpap. She report she did not receive this message and also aware order was placed for this.  She needed nothing further

## 2015-06-22 ENCOUNTER — Telehealth: Payer: Self-pay | Admitting: Pulmonary Disease

## 2015-06-22 DIAGNOSIS — G4733 Obstructive sleep apnea (adult) (pediatric): Secondary | ICD-10-CM

## 2015-06-22 NOTE — Telephone Encounter (Signed)
Patient returned call.  Unable to reach [place].  Please call patient back at [phone]

## 2015-06-22 NOTE — Telephone Encounter (Signed)
Not required

## 2015-06-22 NOTE — Telephone Encounter (Signed)
Spoke with pt and is aware of results. Order placed to increase 2 liters blended with CPAP. Does pt need to repeat ONO on 2 liters CPAP?

## 2015-06-22 NOTE — Telephone Encounter (Signed)
ONO on CPAP + 1L  30 min desat <88% Increase to 2L

## 2015-06-22 NOTE — Telephone Encounter (Signed)
Left message with husband to call back.

## 2015-06-28 ENCOUNTER — Telehealth (HOSPITAL_COMMUNITY): Payer: Self-pay | Admitting: Cardiology

## 2015-06-28 NOTE — Telephone Encounter (Signed)
Pt scheduled for VQ scan on 06/29/2015 Cpt code [id number] With pts current insurance- [employer] No pre cert is required- call ref # [id number]

## 2015-06-29 ENCOUNTER — Ambulatory Visit (HOSPITAL_COMMUNITY)
Admission: RE | Admit: 2015-06-29 | Discharge: 2015-06-29 | Disposition: A | Discharge status: Home / Self Care | Payer: BLUE CROSS/BLUE SHIELD | Source: Ambulatory Visit | Attending: Internal Medicine | Admitting: Internal Medicine

## 2015-06-29 ENCOUNTER — Encounter: Payer: Self-pay | Admitting: Pulmonary Disease

## 2015-06-29 DIAGNOSIS — I272 Other secondary pulmonary hypertension: Secondary | ICD-10-CM | POA: Diagnosis not present

## 2015-06-29 DIAGNOSIS — R0600 Dyspnea, unspecified: Secondary | ICD-10-CM

## 2015-06-29 MED ORDER — TECHNETIUM TC 99M DIETHYLENETRIAME-PENTAACETIC ACID: 38.5000 | Freq: Once | INTRAVENOUS | Status: AC | PRN

## 2015-06-29 MED ORDER — TECHNETIUM TO 99M ALBUMIN AGGREGATED
5.9000 | Freq: Once | INTRAVENOUS | Status: AC | PRN
  Administered 2015-06-29: 6 via INTRAVENOUS

## 2015-06-30 ENCOUNTER — Encounter: Payer: Self-pay | Admitting: Pulmonary Disease

## 2015-07-04 ENCOUNTER — Telehealth: Payer: Self-pay | Admitting: Pulmonary Disease

## 2015-07-04 NOTE — Telephone Encounter (Signed)
Patient requesting a handicap sticker until she gets surgery (Gastric Resection).  Patient using oxygen currently when walking any distance or during exercise.   To Dr. Craige CottaSood in RA absence. Ok to complete handicap sticker forms for patient? Patient would like form mailed to her, address confirmed.

## 2015-07-04 NOTE — Telephone Encounter (Signed)
OK to fill out?

## 2015-07-04 NOTE — Telephone Encounter (Signed)
This is not an emergent request >> can be addressed by Dr. Vassie LollAlva when he is done with his stretch of elink shifts.  Will route back to Triage and to Dr. Vassie LollAlva.

## 2015-07-04 NOTE — Telephone Encounter (Signed)
Dr. Vassie LollAlva, ok to have handicap sticker?  Please advise.

## 2015-07-05 NOTE — Telephone Encounter (Signed)
Pt returning called can be reached back # [phone].Rachael GriffinsStanley A Camacho

## 2015-07-05 NOTE — Telephone Encounter (Signed)
Form has been completed, awaiting signature.

## 2015-07-05 NOTE — Telephone Encounter (Signed)
LMTCB for pt.  Placard form [facility] to give to [facility] to sign and mail to pt.   Will forward to her to follow.

## 2015-07-05 NOTE — Telephone Encounter (Signed)
Pt is aware. Will forward to [place].

## 2015-07-07 NOTE — Telephone Encounter (Signed)
Has this been taken care of?

## 2015-07-07 NOTE — Telephone Encounter (Addendum)
Form has been completed and placed in RA's look at folder awaiting his signature so I can mail it out. RA came by office yesterday after I left and looked at folders,  Will check folders when I get to [facility]. Called and advised patient that I will mail to her as soon as I get the form signed.

## 2015-07-07 NOTE — Telephone Encounter (Signed)
Looked in Dr. Reginia NaasAlva's office, his look at folder is not there, awaiting Alva's return to office tomorrow to get form Will hold in box until completed.

## 2015-07-08 NOTE — Telephone Encounter (Signed)
Form completed and mailed to patient, Address verified. Patient notified.  Nothing further needed.

## 2015-07-11 ENCOUNTER — Encounter: Payer: BLUE CROSS/BLUE SHIELD | Attending: Family Medicine

## 2015-07-11 DIAGNOSIS — Z6841 Body Mass Index (BMI) 40.0 and over, adult: Secondary | ICD-10-CM | POA: Insufficient documentation

## 2015-07-11 DIAGNOSIS — Z713 Dietary counseling and surveillance: Secondary | ICD-10-CM | POA: Insufficient documentation

## 2015-07-11 NOTE — Progress Notes (Signed)
  Pre-Operative Nutrition Class:  Appt start time: 830   End time:  930.  Patient was seen on 07/11/2015 for Pre-Operative Bariatric Surgery [facility]   Surgery date: 07/26/2015 Surgery type: Sleeve Gastrectomy [facility] weight at [facility]: 251 lbs on 03/26/15 Weight today: 252 lbs  TANITA  BODY COMP RESULTS  07/11/15   BMI (kg/m^2) 42.6   Fat Mass (lbs) 107   Fat Free Mass (lbs) 145   Total Body Water (lbs) 106   Samples given per MNT protocol. Patient educated on appropriate usage: Premier protein shake (chocolate - qty 1) Lot #: [id number] Exp: 03/2016  Celebrate Vitamins Multivitamin (berry - qty 1) Lot #: [id number] Exp: 02/2017  Renee Pain Protein Powder (chicken soup - qty 1) Lot #: [id number] Exp: 06/2016   The following the learning objectives were met by the patient during this course:  Identify Pre-Op Dietary Goals and will begin 2 weeks pre-operatively  Identify appropriate sources of fluids and proteins   State protein recommendations and appropriate sources pre and post-operatively  Identify Post-Operative Dietary Goals and will follow for 2 weeks post-operatively  Identify appropriate multivitamin and calcium sources  Describe the need for physical activity post-operatively and will follow MD recommendations  State when to call healthcare provider regarding medication questions or post-operative complications  Handouts given during class include:  Pre-Op Bariatric Surgery Diet Handout  Protein Shake Handout  Post-Op Bariatric Surgery Nutrition Handout  BELT Program Information Flyer  Support Group Information Flyer  [facility] Bariatric Supplements Price List  Follow-Up Plan: Patient will follow-up at [facility] 2 weeks post operatively for diet advancement per MD.

## 2015-07-13 ENCOUNTER — Telehealth (HOSPITAL_COMMUNITY): Payer: Self-pay | Admitting: Cardiology

## 2015-07-13 NOTE — Telephone Encounter (Signed)
Pt is scheduled for bariatric sx 08/04/15, pt states she was told verbally by Dr. Gala RomneyBensimhon ok to proceed with sx from cardiac stand point however patient is requesting something formal to be given to sx center so sx can proceed without any hiccups.

## 2015-07-14 NOTE — Progress Notes (Signed)
Please put orders in Epic surgery 07-26-15 pre op 07-21-15 Thanks

## 2015-07-20 ENCOUNTER — Ambulatory Visit: Payer: Self-pay | Admitting: Surgery

## 2015-07-20 NOTE — Patient Instructions (Signed)
Rachael Camacho  07/20/2015   Your procedure is scheduled on: Tuesday 07/26/2015  Report to [facility] Main  Entrance take [place]  elevators to 3rd floor to  Short Stay Center at  0845 AM.  Call this number if you have problems the morning of surgery [phone]   Remember: ONLY 1 PERSON MAY GO WITH YOU TO SHORT STAY TO GET  READY MORNING OF YOUR SURGERY.  Do not eat food or drink liquids :After Midnight.     Take these medicines the morning of surgery with A SIP OF WATER: use Flonase nasal spray if needed                               You may not have any metal on your body including hair pins and              piercings  Do not wear jewelry, make-up, lotions, powders or perfumes, deodorant             Do not wear nail polish.  Do not shave  48 hours prior to surgery.              Men may shave face and neck.   Do not bring valuables to the hospital. [place] IS NOT             RESPONSIBLE   FOR VALUABLES.  Contacts, dentures or bridgework may not be worn into surgery.  Leave suitcase in the car. After surgery it may be brought to your room.     Patients discharged the day of surgery will not be allowed to drive home.  Name and phone number of your driver:  Special Instructions: N/A              Please read over the following fact sheets you were given: _____________________________________________________________________             [facility] - Preparing for Surgery Before surgery, you can play an important role.  Because skin is not sterile, your skin needs to be as free of germs as possible.  You can reduce the number of germs on your skin by washing with CHG (chlorahexidine gluconate) soap before surgery.  CHG is an antiseptic cleaner which kills germs and bonds with the skin to continue killing germs even after washing. Please DO NOT use if you have an allergy to CHG or antibacterial soaps.  If your skin becomes reddened/irritated stop using the  CHG and inform your nurse when you arrive at Short Stay. Do not shave (including legs and underarms) for at least 48 hours prior to the first CHG shower.  You may shave your face/neck. Please follow these instructions carefully:  1.  Shower with CHG Soap the night before surgery and the  morning of Surgery.  2.  If you choose to wash your hair, wash your hair first as usual with your  normal  shampoo.  3.  After you shampoo, rinse your hair and body thoroughly to remove the  shampoo.                           4.  Use CHG as you would any other liquid soap.  You can apply chg directly  to the skin and wash  Gently with a scrungie or clean washcloth.  5.  Apply the CHG Soap to your body ONLY FROM THE NECK DOWN.   Do not use on face/ open                           Wound or open sores. Avoid contact with eyes, ears mouth and genitals (private parts).                       Wash face,  Genitals (private parts) with your normal soap.             6.  Wash thoroughly, paying special attention to the area where your surgery  will be performed.  7.  Thoroughly rinse your body with warm water from the neck down.  8.  DO NOT shower/wash with your normal soap after using and rinsing off  the CHG Soap.                9.  Pat yourself dry with a clean towel.            10.  Wear clean pajamas.            11.  Place clean sheets on your bed the night of your first shower and do not  sleep with pets. Day of Surgery : Do not apply any lotions/deodorants the morning of surgery.  Please wear clean clothes to the hospital/surgery center.  FAILURE TO FOLLOW THESE INSTRUCTIONS MAY RESULT IN THE CANCELLATION OF YOUR SURGERY PATIENT SIGNATURE_________________________________  NURSE SIGNATURE__________________________________  ________________________________________________________________________   Adam Phenix  An incentive spirometer is a tool that can help keep your lungs clear  and active. This tool measures how well you are filling your lungs with each breath. Taking long deep breaths may help reverse or decrease the chance of developing breathing (pulmonary) problems (especially infection) following:  A long period of time when you are unable to move or be active. BEFORE THE PROCEDURE   If the spirometer includes an indicator to show your best effort, your nurse or respiratory therapist will set it to a desired goal.  If possible, sit up straight or lean slightly forward. Try not to slouch.  Hold the incentive spirometer in an upright position. INSTRUCTIONS FOR USE  1. Sit on the edge of your bed if possible, or sit up as far as you can in bed or on a chair. 2. Hold the incentive spirometer in an upright position. 3. Breathe out normally. 4. Place the mouthpiece in your mouth and seal your lips tightly around it. 5. Breathe in slowly and as deeply as possible, raising the piston or the ball toward the top of the column. 6. Hold your breath for 3-5 seconds or for as long as possible. Allow the piston or ball to fall to the bottom of the column. 7. Remove the mouthpiece from your mouth and breathe out normally. 8. Rest for a few seconds and repeat Steps 1 through 7 at least 10 times every 1-2 hours when you are awake. Take your time and take a few normal breaths between deep breaths. 9. The spirometer may include an indicator to show your best effort. Use the indicator as a goal to work toward during each repetition. 10. After each set of 10 deep breaths, practice coughing to be sure your lungs are clear. If you have an incision (the cut made at the time of surgery),  support your incision when coughing by placing a pillow or rolled up towels firmly against it. Once you are able to get out of bed, walk around indoors and cough well. You may stop using the incentive spirometer when instructed by your caregiver.  RISKS AND COMPLICATIONS  Take your time so you do not  get dizzy or light-headed.  If you are in pain, you may need to take or ask for pain medication before doing incentive spirometry. It is harder to take a deep breath if you are having pain. AFTER USE  Rest and breathe slowly and easily.  It can be helpful to keep track of a log of your progress. Your caregiver can provide you with a simple table to help with this. If you are using the spirometer at home, follow these instructions: [place] IF:   You are having difficultly using the spirometer.  You have trouble using the spirometer as often as instructed.  Your pain medication is not giving enough relief while using the spirometer.  You develop fever of 100.5 F (38.1 C) or higher. SEEK IMMEDIATE MEDICAL CARE IF:   You cough up bloody sputum that had not been present before.  You develop fever of 102 F (38.9 C) or greater.  You develop worsening pain at or near the incision site. MAKE SURE YOU:   Understand these instructions.  Will watch your condition.  Will get help right away if you are not doing well or get worse. Document Released: 04/22/2007 Document Revised: 03/03/2012 Document Reviewed: 06/23/2007 [facility] Patient Information 2014 [place], [place].   ________________________________________________________________________

## 2015-07-21 ENCOUNTER — Encounter (HOSPITAL_COMMUNITY)
Admission: RE | Admit: 2015-07-21 | Discharge: 2015-07-21 | Disposition: A | Discharge status: Home / Self Care | Payer: BLUE CROSS/BLUE SHIELD | Source: Ambulatory Visit | Attending: Surgery | Admitting: Surgery

## 2015-07-21 ENCOUNTER — Encounter (HOSPITAL_COMMUNITY): Payer: Self-pay

## 2015-07-21 DIAGNOSIS — Z01818 Encounter for other preprocedural examination: Secondary | ICD-10-CM | POA: Diagnosis not present

## 2015-07-21 HISTORY — DX: Reserved for inherently not codable concepts without codable children: IMO0001

## 2015-07-21 HISTORY — DX: Anemia, unspecified: D64.9

## 2015-07-21 HISTORY — DX: Gestational diabetes mellitus in childbirth, diet controlled: O24.420

## 2015-07-21 LAB — CBC WITH DIFFERENTIAL/PLATELET
BASOS PCT: 0 % (ref 0–1)
Basophils Absolute: 0 10*3/uL (ref 0.0–0.1)
EOS PCT: 1 % (ref 0–5)
Eosinophils Absolute: 0.1 10*3/uL (ref 0.0–0.7)
HCT: 42.9 % (ref 36.0–46.0)
Hemoglobin: 14.2 g/dL (ref 12.0–15.0)
LYMPHS PCT: 34 % (ref 12–46)
Lymphs Abs: 2.5 10*3/uL (ref 0.7–4.0)
MCH: 29.2 pg (ref 26.0–34.0)
MCHC: 33.1 g/dL (ref 30.0–36.0)
MCV: 88.1 fL (ref 78.0–100.0)
MONOS PCT: 9 % (ref 3–12)
Monocytes Absolute: 0.6 10*3/uL (ref 0.1–1.0)
NEUTROS ABS: 4.1 10*3/uL (ref 1.7–7.7)
Neutrophils Relative %: 56 % (ref 43–77)
PLATELETS: 254 10*3/uL (ref 150–400)
RBC: 4.87 MIL/uL (ref 3.87–5.11)
RDW: 13.2 % (ref 11.5–15.5)
WBC: 7.4 10*3/uL (ref 4.0–10.5)

## 2015-07-21 LAB — COMPREHENSIVE METABOLIC PANEL
ALT: 26 U/L (ref 14–54)
AST: 25 U/L (ref 15–41)
Albumin: 4.2 g/dL (ref 3.5–5.0)
Alkaline Phosphatase: 74 U/L (ref 38–126)
Anion gap: 8 (ref 5–15)
BUN: 21 mg/dL — ABNORMAL HIGH (ref 6–20)
CO2: 27 mmol/L (ref 22–32)
Calcium: 9.7 mg/dL (ref 8.9–10.3)
Chloride: 107 mmol/L (ref 101–111)
Creatinine, Ser: 0.77 mg/dL (ref 0.44–1.00)
GFR calc Af Amer: >60 mL/min (ref >60)
GLUCOSE: 100 mg/dL — AB (ref 65–99)
POTASSIUM: 5.2 mmol/L — AB (ref 3.5–5.1)
Sodium: 142 mmol/L (ref 135–145)
Total Bilirubin: 0.6 mg/dL (ref 0.3–1.2)
Total Protein: 7.3 g/dL (ref 6.5–8.1)

## 2015-07-21 NOTE — H&P (Signed)
The patient is a 49 year old female who presents for a bariatric surgery evaluation. Diets tried in the past include low calorie, low carbohydrate and low fat. Weight watchers. Has been to our seminar. Has cousin who did great with a sleeve. She wants a sleeve. She doesn't have type II diabetes. We had a long discussion about sleeve gastrectomy and its complications not limited to leaks and bleeds. I discussed the concept that this is a tool and that she had to change her behavior for this to be ultimately successful. She has been motivated recently by issues with exercise tolerance and worry about some perhaps heart failure issues and oxygen saturation issues that are being worked up at present. She may have sleep apnea and is being worked up with a palpation sleep study at the present time. She does have GERD and reflux and takes a PPI.  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  This was instituted for her exertional related SOB.  According to the patient, he did not indicate that this should preclude her undergoing a sleeve gastrectomy.     Other Problems Gilmer Mor, CMA; 02/22/2015 1:54 PM) Anxiety Disorder Arthritis Back Pain Bladder Problems Cholelithiasis Diverticulosis Gastroesophageal Reflux Disease High blood pressure Kidney Stone  Past Surgical History Gilmer Mor, CMA; 02/22/2015 1:54 PM) Cesarean Section - Multiple Gallbladder Surgery - Laparoscopic Hysterectomy (not due to cancer) - Partial  Diagnostic Studies History Gilmer Mor, CMA; 02/22/2015 1:54 PM) Colonoscopy 1-5 years ago Mammogram within last year Pap Smear 1-5 years ago  Allergies Gilmer Mor, CMA; 02/22/2015 1:58 PM) Percocet *ANALGESICS - OPIOID* [name] *URINARY ANTI-INFECTIVES* [name] HCl *TETRACYCLINES* Sulfa Antibiotics  Medication History (Sonya Bynum, CMA; 02/22/2015 1:58 PM) Furosemide (20MG  Tablet, Oral) Active. NexIUM (40MG  Capsule DR, Oral)  Active. Fluconazole (150MG  Tablet, Oral) Active. Desloratadine (5MG  Tablet, Oral) Active.  Social History Gilmer Mor, CMA; 02/22/2015 1:54 PM) Alcohol use Occasional alcohol use. No caffeine use No drug use Tobacco use Never smoker.  Family History Gilmer Mor, CMA; 02/22/2015 1:54 PM) Arthritis Mother. Colon Polyps Mother. Depression Mother. Heart Disease Brother, Father, Mother, Son. Heart disease in female family member before age 12 Hypertension Father, Mother. Kidney Disease Son. Respiratory Condition Mother. Thyroid problems Mother.  Pregnancy / Birth History Gilmer Mor, CMA; 02/22/2015 1:54 PM) Age at menarche 11 years. Contraceptive History Oral contraceptives. Gravida 2 Maternal age 20-20 Para 2  Review of Systems Lamar Laundry Bynum CMA; 02/22/2015 1:54 PM) General Present- Fatigue. Not Present- Appetite Loss, Chills, Fever, Night Sweats, Weight Gain and Weight Loss. Skin Present- Dryness. Not Present- Change in Wart/Mole, Hives, Jaundice, New Lesions, Non-Healing Wounds, Rash and Ulcer. HEENT Present- Seasonal Allergies and Wears glasses/contact lenses. Not Present- Earache, Hearing Loss, Hoarseness, Nose Bleed, Oral Ulcers, Ringing in the Ears, Sinus Pain, Sore Throat, Visual Disturbances and Yellow Eyes. Respiratory Not Present- Bloody sputum, Chronic Cough, Difficulty Breathing, Snoring and Wheezing. Breast Not Present- Breast Mass, Breast Pain, Nipple Discharge and Skin Changes. Cardiovascular Present- Leg Cramps and Shortness of Breath. Not Present- Chest Pain, Difficulty Breathing Lying Down, Palpitations, Rapid Heart Rate and Swelling of Extremities. Gastrointestinal Present- Change in Bowel Habits and Constipation. Not Present- Abdominal Pain, Bloating, Bloody Stool, Chronic diarrhea, Difficulty Swallowing, Excessive gas, Gets full quickly at meals, Hemorrhoids, Indigestion, Nausea, Rectal Pain and Vomiting. Female Genitourinary Present- Frequency and  Urgency. Not Present- Nocturia, Painful Urination and Pelvic Pain. Musculoskeletal Present- Joint Stiffness. Not Present- Back Pain, Joint Pain, Muscle Pain, Muscle Weakness and Swelling of Extremities. Neurological Not  Present- Decreased Memory, Fainting, Headaches, Numbness, Seizures, Tingling, Tremor, Trouble walking and Weakness. Psychiatric Not Present- Anxiety, Bipolar, Change in Sleep Pattern, Depression, Fearful and Frequent crying. Endocrine Present- Cold Intolerance and Excessive Hunger. Not Present- Hair Changes, Heat Intolerance, Hot flashes and New Diabetes. Hematology Not Present- Easy Bruising, Excessive bleeding, Gland problems, HIV and Persistent Infections.   Vitals (Sonya Bynum CMA; 02/22/2015 1:55 PM) 02/22/2015 1:54 PM Weight: 250.6 lb Height: 64in Body Surface Area: 2.27 m Body Mass Index: 43.01 kg/m Temp.: 97.29F(Temporal)  Pulse: 79 (Regular)  BP: 132/86 (Sitting, Left Arm, Standard)    Physical Exam (Shianna Bally B. Daphine Deutscher MD; 02/22/2015 2:45 PM) General Note: HEENT negative Neck supple without bruits Chest clear Heart SR without murmurs Abdomen obese with scars from prior cholecystectomy and lap hysterectomy Ext FROM without history of DVT Neuro alert and oriented x3 motor and sensory function is intact     Assessment & Plan Molli Hazard B. Daphine Deutscher MD; 02/22/2015 2:46 PM) MORBID OBESITY WITH BMI OF 40.0-44.9, ADULT (278.01  E66.01)  She does have GER but no hiatus hernia on UGI.  Will look at the time of her sleeve.

## 2015-07-26 ENCOUNTER — Encounter (HOSPITAL_COMMUNITY)
Admission: RE | Disposition: A | Discharge status: Home / Self Care | Payer: Self-pay | Source: Ambulatory Visit | Attending: Surgery

## 2015-07-26 ENCOUNTER — Inpatient Hospital Stay (HOSPITAL_COMMUNITY): Payer: BLUE CROSS/BLUE SHIELD | Admitting: Certified Registered Nurse Anesthetist

## 2015-07-26 ENCOUNTER — Inpatient Hospital Stay (HOSPITAL_COMMUNITY)
Admission: RE | Admit: 2015-07-26 | Discharge: 2015-07-28 | DRG: 621 | Disposition: A | Discharge status: Home / Self Care | Payer: BLUE CROSS/BLUE SHIELD | Source: Ambulatory Visit | Attending: Surgery | Admitting: Surgery

## 2015-07-26 ENCOUNTER — Encounter (HOSPITAL_COMMUNITY): Payer: Self-pay | Admitting: Anesthesiology

## 2015-07-26 DIAGNOSIS — Z6841 Body Mass Index (BMI) 40.0 and over, adult: Secondary | ICD-10-CM | POA: Diagnosis not present

## 2015-07-26 DIAGNOSIS — Z8371 Family history of colonic polyps: Secondary | ICD-10-CM

## 2015-07-26 DIAGNOSIS — Z9884 Bariatric surgery status: Secondary | ICD-10-CM

## 2015-07-26 DIAGNOSIS — Z01812 Encounter for preprocedural laboratory examination: Secondary | ICD-10-CM | POA: Diagnosis not present

## 2015-07-26 DIAGNOSIS — Z79899 Other long term (current) drug therapy: Secondary | ICD-10-CM

## 2015-07-26 DIAGNOSIS — G4733 Obstructive sleep apnea (adult) (pediatric): Secondary | ICD-10-CM | POA: Diagnosis present

## 2015-07-26 DIAGNOSIS — I272 Other secondary pulmonary hypertension: Secondary | ICD-10-CM | POA: Diagnosis present

## 2015-07-26 DIAGNOSIS — Z8249 Family history of ischemic heart disease and other diseases of the circulatory system: Secondary | ICD-10-CM

## 2015-07-26 HISTORY — PX: LAPAROSCOPIC GASTRIC SLEEVE RESECTION: SHX5895

## 2015-07-26 HISTORY — PX: UPPER GI ENDOSCOPY: SHX6162

## 2015-07-26 HISTORY — PX: HIATAL HERNIA REPAIR: SHX195

## 2015-07-26 HISTORY — DX: Family history of other specified conditions: Z84.89

## 2015-07-26 LAB — CBC
HCT: 41.8 % (ref 36.0–46.0)
Hemoglobin: 13.9 g/dL (ref 12.0–15.0)
MCH: 29.4 pg (ref 26.0–34.0)
MCHC: 33.3 g/dL (ref 30.0–36.0)
MCV: 88.6 fL (ref 78.0–100.0)
PLATELETS: 185 10*3/uL (ref 150–400)
RBC: 4.72 MIL/uL (ref 3.87–5.11)
RDW: 13.3 % (ref 11.5–15.5)
WBC: 8.6 10*3/uL (ref 4.0–10.5)

## 2015-07-26 LAB — BASIC METABOLIC PANEL
Anion gap: 6 (ref 5–15)
BUN: 15 mg/dL (ref 6–20)
CHLORIDE: 109 mmol/L (ref 101–111)
CO2: 23 mmol/L (ref 22–32)
CREATININE: 0.82 mg/dL (ref 0.44–1.00)
Calcium: 8.7 mg/dL — ABNORMAL LOW (ref 8.9–10.3)
GFR calc Af Amer: >60 mL/min (ref >60)
GFR calc non Af Amer: >60 mL/min (ref >60)
GLUCOSE: 118 mg/dL — AB (ref 65–99)
Potassium: 4.3 mmol/L (ref 3.5–5.1)
Sodium: 138 mmol/L (ref 135–145)

## 2015-07-26 LAB — HEMOGLOBIN AND HEMATOCRIT, BLOOD
HCT: 42.5 % (ref 36.0–46.0)
HEMOGLOBIN: 13.8 g/dL (ref 12.0–15.0)

## 2015-07-26 SURGERY — GASTRECTOMY, SLEEVE, LAPAROSCOPIC
Anesthesia: General | Site: Esophagus

## 2015-07-26 MED ORDER — LABETALOL HCL 5 MG/ML IV SOLN
INTRAVENOUS | Status: AC
  Filled 2015-07-26: qty 4

## 2015-07-26 MED ORDER — BUPIVACAINE LIPOSOME 1.3 % IJ SUSP
INTRAMUSCULAR | Status: DC | PRN
  Administered 2015-07-26: 20 mL

## 2015-07-26 MED ORDER — KCL IN DEXTROSE-NACL 20-5-0.45 MEQ/L-%-% IV SOLN
INTRAVENOUS | Status: DC
  Administered 2015-07-26: 100 mL/h via INTRAVENOUS
  Administered 2015-07-26 – 2015-07-27 (×3): via INTRAVENOUS
  Filled 2015-07-26 (×7): qty 1000

## 2015-07-26 MED ORDER — ACETAMINOPHEN 160 MG/5ML PO SOLN: 325.0000 mg | ORAL | Status: DC | PRN

## 2015-07-26 MED ORDER — DEXAMETHASONE SODIUM PHOSPHATE 10 MG/ML IJ SOLN
INTRAMUSCULAR | Status: DC | PRN
  Administered 2015-07-26: 10 mg via INTRAVENOUS

## 2015-07-26 MED ORDER — NEOSTIGMINE METHYLSULFATE 10 MG/10ML IV SOLN
INTRAVENOUS | Status: DC | PRN
  Administered 2015-07-26: 5 mg via INTRAVENOUS

## 2015-07-26 MED ORDER — MORPHINE SULFATE 2 MG/ML IJ SOLN
2.0000 mg | INTRAMUSCULAR | Status: DC | PRN
  Administered 2015-07-26: 4 mg via INTRAVENOUS
  Administered 2015-07-26 (×2): 2 mg via INTRAVENOUS
  Administered 2015-07-26: 6 mg via INTRAVENOUS
  Administered 2015-07-26 – 2015-07-27 (×2): 4 mg via INTRAVENOUS
  Administered 2015-07-27 (×2): 6 mg via INTRAVENOUS
  Filled 2015-07-26: qty 3
  Filled 2015-07-26: qty 2
  Filled 2015-07-26 (×2): qty 1
  Filled 2015-07-26 (×2): qty 2
  Filled 2015-07-26: qty 3
  Filled 2015-07-26: qty 2
  Filled 2015-07-26: qty 3

## 2015-07-26 MED ORDER — ROCURONIUM BROMIDE 100 MG/10ML IV SOLN
INTRAVENOUS | Status: AC
  Filled 2015-07-26: qty 1

## 2015-07-26 MED ORDER — PROPOFOL 10 MG/ML IV BOLUS
INTRAVENOUS | Status: AC
  Filled 2015-07-26: qty 20

## 2015-07-26 MED ORDER — CHLORHEXIDINE GLUCONATE CLOTH 2 % EX PADS: 6.0000 | MEDICATED_PAD | Freq: Once | CUTANEOUS | Status: DC

## 2015-07-26 MED ORDER — LACTATED RINGERS IR SOLN
Status: DC | PRN
  Administered 2015-07-26: 1000 mL

## 2015-07-26 MED ORDER — UNJURY CHICKEN SOUP POWDER: 2.0000 [oz_av] | Freq: Four times a day (QID) | ORAL | Status: DC

## 2015-07-26 MED ORDER — GLYCOPYRROLATE 0.2 MG/ML IJ SOLN
INTRAMUSCULAR | Status: AC
  Filled 2015-07-26: qty 3

## 2015-07-26 MED ORDER — ROCURONIUM BROMIDE 100 MG/10ML IV SOLN
INTRAVENOUS | Status: DC | PRN
  Administered 2015-07-26: 10 mg via INTRAVENOUS
  Administered 2015-07-26: 40 mg via INTRAVENOUS
  Administered 2015-07-26: 10 mg via INTRAVENOUS

## 2015-07-26 MED ORDER — PROPOFOL 10 MG/ML IV BOLUS
INTRAVENOUS | Status: DC | PRN
  Administered 2015-07-26: 200 mg via INTRAVENOUS

## 2015-07-26 MED ORDER — HYDROMORPHONE HCL 1 MG/ML IJ SOLN
INTRAMUSCULAR | Status: AC
  Filled 2015-07-26: qty 1

## 2015-07-26 MED ORDER — EPHEDRINE SULFATE 50 MG/ML IJ SOLN
INTRAMUSCULAR | Status: DC | PRN
  Administered 2015-07-26: 5 mg via INTRAVENOUS

## 2015-07-26 MED ORDER — HEPARIN SODIUM (PORCINE) 5000 UNIT/ML IJ SOLN
5000.0000 [IU] | INTRAMUSCULAR | Status: AC
  Administered 2015-07-26: 5000 [IU] via SUBCUTANEOUS
  Filled 2015-07-26: qty 1

## 2015-07-26 MED ORDER — DEXTROSE 5 % IV SOLN
INTRAVENOUS | Status: AC
  Filled 2015-07-26: qty 2

## 2015-07-26 MED ORDER — CETYLPYRIDINIUM CHLORIDE 0.05 % MT LIQD
7.0000 mL | Freq: Two times a day (BID) | OROMUCOSAL | Status: DC
  Administered 2015-07-26 – 2015-07-28 (×4): 7 mL via OROMUCOSAL

## 2015-07-26 MED ORDER — SODIUM CHLORIDE 0.9 % IJ SOLN
INTRAMUSCULAR | Status: AC
  Filled 2015-07-26: qty 10

## 2015-07-26 MED ORDER — HYDROMORPHONE HCL 1 MG/ML IJ SOLN
0.2500 mg | INTRAMUSCULAR | Status: DC | PRN
  Administered 2015-07-26 (×2): 0.5 mg via INTRAVENOUS

## 2015-07-26 MED ORDER — HEPARIN SODIUM (PORCINE) 5000 UNIT/ML IJ SOLN
5000.0000 [IU] | Freq: Three times a day (TID) | INTRAMUSCULAR | Status: DC
  Administered 2015-07-26 – 2015-07-28 (×6): 5000 [IU] via SUBCUTANEOUS
  Filled 2015-07-26 (×8): qty 1

## 2015-07-26 MED ORDER — ACETAMINOPHEN 160 MG/5ML PO SOLN: 650.0000 mg | ORAL | Status: DC | PRN

## 2015-07-26 MED ORDER — LABETALOL HCL 5 MG/ML IV SOLN
INTRAVENOUS | Status: DC | PRN
  Administered 2015-07-26 (×2): 2.5 mg via INTRAVENOUS

## 2015-07-26 MED ORDER — DEXTROSE 5 % IV SOLN
2.0000 g | INTRAVENOUS | Status: AC
  Administered 2015-07-26: 2 g via INTRAVENOUS

## 2015-07-26 MED ORDER — LACTATED RINGERS IV SOLN
INTRAVENOUS | Status: DC | PRN
  Administered 2015-07-26 (×2): via INTRAVENOUS

## 2015-07-26 MED ORDER — SUCCINYLCHOLINE CHLORIDE 20 MG/ML IJ SOLN
INTRAMUSCULAR | Status: DC | PRN
  Administered 2015-07-26: 100 mg via INTRAVENOUS

## 2015-07-26 MED ORDER — GLYCOPYRROLATE 0.2 MG/ML IJ SOLN
INTRAMUSCULAR | Status: DC | PRN
  Administered 2015-07-26: 0.6 mg via INTRAVENOUS

## 2015-07-26 MED ORDER — MIDAZOLAM HCL 2 MG/2ML IJ SOLN
INTRAMUSCULAR | Status: AC
  Filled 2015-07-26: qty 4

## 2015-07-26 MED ORDER — UNJURY VANILLA POWDER: 2.0000 [oz_av] | Freq: Four times a day (QID) | ORAL | Status: DC

## 2015-07-26 MED ORDER — OXYCODONE HCL 5 MG/5ML PO SOLN
5.0000 mg | ORAL | Status: DC | PRN
  Administered 2015-07-27: 10 mg via ORAL
  Administered 2015-07-27: 5 mg via ORAL
  Administered 2015-07-28: 10 mg via ORAL
  Filled 2015-07-26: qty 5
  Filled 2015-07-26 (×2): qty 10

## 2015-07-26 MED ORDER — ONDANSETRON HCL 4 MG/2ML IJ SOLN
INTRAMUSCULAR | Status: AC
  Filled 2015-07-26: qty 2

## 2015-07-26 MED ORDER — DIPHENHYDRAMINE HCL 12.5 MG/5ML PO ELIX: 12.5000 mg | ORAL_SOLUTION | Freq: Four times a day (QID) | ORAL | Status: DC | PRN

## 2015-07-26 MED ORDER — 0.9 % SODIUM CHLORIDE (POUR BTL) OPTIME
TOPICAL | Status: DC | PRN
  Administered 2015-07-26: 1000 mL

## 2015-07-26 MED ORDER — UNJURY CHOCOLATE CLASSIC POWDER
2.0000 [oz_av] | Freq: Four times a day (QID) | ORAL | Status: DC
  Administered 2015-07-28 (×2): 2 [oz_av] via ORAL

## 2015-07-26 MED ORDER — PROMETHAZINE HCL 25 MG/ML IJ SOLN: 6.2500 mg | INTRAMUSCULAR | Status: DC | PRN

## 2015-07-26 MED ORDER — MIDAZOLAM HCL 5 MG/5ML IJ SOLN
INTRAMUSCULAR | Status: DC | PRN
  Administered 2015-07-26: 2 mg via INTRAVENOUS

## 2015-07-26 MED ORDER — FENTANYL CITRATE (PF) 100 MCG/2ML IJ SOLN
INTRAMUSCULAR | Status: DC | PRN
  Administered 2015-07-26 (×5): 50 ug via INTRAVENOUS

## 2015-07-26 MED ORDER — ONDANSETRON HCL 4 MG/2ML IJ SOLN
INTRAMUSCULAR | Status: DC | PRN
  Administered 2015-07-26: 4 mg via INTRAVENOUS

## 2015-07-26 MED ORDER — LIDOCAINE HCL (CARDIAC) 20 MG/ML IV SOLN
INTRAVENOUS | Status: AC
  Filled 2015-07-26: qty 5

## 2015-07-26 MED ORDER — SODIUM CHLORIDE 0.9 % IJ SOLN
INTRAMUSCULAR | Status: DC | PRN
  Administered 2015-07-26: 10 mL via INTRAVENOUS

## 2015-07-26 MED ORDER — FENTANYL CITRATE (PF) 250 MCG/5ML IJ SOLN
INTRAMUSCULAR | Status: AC
  Filled 2015-07-26: qty 25

## 2015-07-26 MED ORDER — EPHEDRINE SULFATE 50 MG/ML IJ SOLN
INTRAMUSCULAR | Status: AC
Start: 2015-07-26 — End: 2015-07-26
  Filled 2015-07-26: qty 1

## 2015-07-26 MED ORDER — ONDANSETRON HCL 4 MG/2ML IJ SOLN
4.0000 mg | INTRAMUSCULAR | Status: DC | PRN
  Administered 2015-07-27: 4 mg via INTRAVENOUS
  Filled 2015-07-26: qty 2

## 2015-07-26 MED ORDER — BUPIVACAINE LIPOSOME 1.3 % IJ SUSP
20.0000 mL | Freq: Once | INTRAMUSCULAR | Status: DC
  Filled 2015-07-26: qty 20

## 2015-07-26 MED ORDER — DEXAMETHASONE SODIUM PHOSPHATE 10 MG/ML IJ SOLN
INTRAMUSCULAR | Status: AC
  Filled 2015-07-26: qty 1

## 2015-07-26 MED ORDER — LIDOCAINE HCL (CARDIAC) 20 MG/ML IV SOLN
INTRAVENOUS | Status: DC | PRN
  Administered 2015-07-26: 100 mg via INTRAVENOUS

## 2015-07-26 SURGICAL SUPPLY — 68 items
APL SRG 32X5 SNPLK LF DISP (MISCELLANEOUS)
APPLICATOR COTTON TIP 6IN STRL (MISCELLANEOUS) IMPLANT
APPLIER CLIP 5 13 M/L LIGAMAX5 (MISCELLANEOUS)
APPLIER CLIP ROT 10 11.4 M/L (STAPLE)
APPLIER CLIP ROT 13.4 12 LRG (CLIP)
APR CLP LRG 13.4X12 ROT 20 MLT (CLIP)
APR CLP MED LRG 11.4X10 (STAPLE)
APR CLP MED LRG 5 ANG JAW (MISCELLANEOUS)
BAG SPEC RTRVL LRG 6X4 10 (ENDOMECHANICALS) ×3
BLADE SURG 15 STRL LF DISP TIS (BLADE) ×3 IMPLANT
BLADE SURG 15 STRL SS (BLADE) ×4
CABLE HIGH FREQUENCY MONO STRZ (ELECTRODE) IMPLANT
CLIP APPLIE 5 13 M/L LIGAMAX5 (MISCELLANEOUS) IMPLANT
CLIP APPLIE ROT 10 11.4 M/L (STAPLE) IMPLANT
CLIP APPLIE ROT 13.4 12 LRG (CLIP) IMPLANT
COVER SURGICAL LIGHT HANDLE (MISCELLANEOUS) ×4 IMPLANT
DEVICE SUT QUICK LOAD TK 5 (STAPLE) ×2 IMPLANT
DEVICE SUT TI-KNOT TK 5X26 (MISCELLANEOUS) ×1 IMPLANT
DEVICE SUTURE ENDOST 10MM (ENDOMECHANICALS) IMPLANT
DEVICE TROCAR PUNCTURE CLOSURE (ENDOMECHANICALS) ×4 IMPLANT
DISSECTOR BLUNT TIP ENDO 5MM (MISCELLANEOUS) ×4 IMPLANT
DRAPE CAMERA CLOSED 9X96 (DRAPES) ×4 IMPLANT
ELECT REM PT RETURN 9FT ADLT (ELECTROSURGICAL) ×4
ELECTRODE REM PT RTRN 9FT ADLT (ELECTROSURGICAL) ×3 IMPLANT
GAUZE SPONGE 4X4 12PLY STRL (GAUZE/BANDAGES/DRESSINGS) IMPLANT
GLOVE BIOGEL M 8.0 STRL (GLOVE) ×4 IMPLANT
GOWN STRL REUS W/TWL XL LVL3 (GOWN DISPOSABLE) ×16 IMPLANT
HANDLE STAPLE EGIA 4 XL (STAPLE) ×4 IMPLANT
HOVERMATT SINGLE USE (MISCELLANEOUS) ×4 IMPLANT
KIT BASIN OR (CUSTOM PROCEDURE TRAY) ×4 IMPLANT
LIQUID BAND (GAUZE/BANDAGES/DRESSINGS) IMPLANT
NDL SPNL 22GX3.5 QUINCKE BK (NEEDLE) ×3 IMPLANT
NEEDLE SPNL 22GX3.5 QUINCKE BK (NEEDLE) ×4 IMPLANT
PACK UNIVERSAL I (CUSTOM PROCEDURE TRAY) ×4 IMPLANT
PEN SKIN MARKING BROAD (MISCELLANEOUS) ×4 IMPLANT
POUCH SPECIMEN RETRIEVAL 10MM (ENDOMECHANICALS) ×1 IMPLANT
RELOAD STAPLE 45 PURP MED/THCK (STAPLE) IMPLANT
RELOAD TRI 45 ART MED THCK BLK (STAPLE) ×4 IMPLANT
RELOAD TRI 45 ART MED THCK PUR (STAPLE) IMPLANT
RELOAD TRI 60 ART MED THCK BLK (STAPLE) ×4 IMPLANT
RELOAD TRI 60 ART MED THCK PUR (STAPLE) ×5 IMPLANT
SCISSORS LAP 5X45 EPIX DISP (ENDOMECHANICALS) IMPLANT
SCRUB PCMX 4 OZ (MISCELLANEOUS) ×8 IMPLANT
SEALANT SURGICAL APPL DUAL CAN (MISCELLANEOUS) IMPLANT
SET IRRIG TUBING LAPAROSCOPIC (IRRIGATION / IRRIGATOR) ×4 IMPLANT
SHEARS CURVED HARMONIC AC 45CM (MISCELLANEOUS) ×4 IMPLANT
SLEEVE ADV FIXATION 5X100MM (TROCAR) ×8 IMPLANT
SLEEVE GASTRECTOMY 36FR VISIGI (MISCELLANEOUS) ×4 IMPLANT
SOLUTION ANTI FOG 6CC (MISCELLANEOUS) ×4 IMPLANT
SPONGE LAP 18X18 X RAY DECT (DISPOSABLE) ×4 IMPLANT
STAPLER VISISTAT 35W (STAPLE) ×4 IMPLANT
SUT SURGIDAC NAB ES-9 0 48 120 (SUTURE) ×3 IMPLANT
SUT VIC AB 4-0 SH 18 (SUTURE) ×4 IMPLANT
SUT VICRYL 0 TIES 12 18 (SUTURE) ×4 IMPLANT
SYR 10ML ECCENTRIC (SYRINGE) ×4 IMPLANT
SYR 20CC LL (SYRINGE) ×4 IMPLANT
SYR 50ML LL SCALE MARK (SYRINGE) ×4 IMPLANT
TOWEL OR 17X26 10 PK STRL BLUE (TOWEL DISPOSABLE) ×8 IMPLANT
TOWEL OR NON WOVEN STRL DISP B (DISPOSABLE) ×4 IMPLANT
TRAY FOLEY W/METER SILVER 14FR (SET/KITS/TRAYS/PACK) IMPLANT
TROCAR ADV FIXATION 12X100MM (TROCAR) ×4 IMPLANT
TROCAR ADV FIXATION 5X100MM (TROCAR) ×4 IMPLANT
TROCAR BLADELESS 15MM (ENDOMECHANICALS) ×4 IMPLANT
TROCAR BLADELESS OPT 5 100 (ENDOMECHANICALS) ×4 IMPLANT
TUBE CALIBRATION LAPBAND (TUBING) IMPLANT
TUBING CONNECTING 10 (TUBING) ×4 IMPLANT
TUBING ENDO SMARTCAP (MISCELLANEOUS) ×4 IMPLANT
TUBING FILTER THERMOFLATOR (ELECTROSURGICAL) ×4 IMPLANT

## 2015-07-26 NOTE — Interval H&P Note (Signed)
History and Physical Interval Note:  07/26/2015 9:48 AM  Rachael Camacho  has presented today for surgery, with the diagnosis of MORBID OBESITY  The various methods of treatment have been discussed with the patient and family. After consideration of risks, benefits and other options for treatment, the patient has consented to  Procedure(s): LAPAROSCOPIC GASTRIC SLEEVE RESECTION (N/A) as a surgical intervention .  The patient's history has been reviewed, patient examined, no change in status, stable for surgery.  I have reviewed the patient's chart and labs.  Questions were answered to the patient's satisfaction.     Carnell,Sally-Anne Wamble B

## 2015-07-26 NOTE — Anesthesia Preprocedure Evaluation (Addendum)
Anesthesia Evaluation  Patient identified by MRN, date of birth, ID band Patient awake    Reviewed: Allergy & Precautions, NPO status , Patient's Chart, lab work & pertinent test results  Airway Mallampati: II  TM Distance: >3 FB Neck ROM: Full    Dental no notable dental hx.    Pulmonary shortness of breath and Long-Term Oxygen Therapy, sleep apnea ,  Pulmonary office note from 06-14-15 reviewed: mild pulmonary htn  Perfusion lung scan 06-29-15 normal. breath sounds clear to auscultation  Pulmonary exam normal       Cardiovascular Exercise Tolerance: Poor + Peripheral Vascular Disease Normal cardiovascular examRhythm:Regular Rate:Normal  Pulmonary HTN  ECG: Normal  ECHO: 11-15-14 Study Conclusions  - Left ventricle: The cavity size was normal. Wall thickness was normal. Systolic function was normal. The estimated ejection fraction was in the range of 55% to 60%. Wall motion was normal; there were no regional wall motion abnormalities. Features are consistent with a pseudonormal left ventricular filling pattern, with concomitant abnormal relaxation and increased filling pressure (grade 2 diastolic dysfunction). - Aortic valve: There was no stenosis. - Mitral valve: There was no significant regurgitation. - Right ventricle: The cavity size was normal. Systolic function was normal. - Pulmonary arteries: No complete TR doppler jet so unable to estimate PA systolic pressure. - Systemic veins: IVC measured 2.3 cm with normal respirophasic variation suggesting RA pressure 8 mmHg.  Impressions:  - Normal LV size with EF 55-60%. Moderate diastolic dysfunction. Normal RV size and systolic function. No significant valvular abnormalities.    Neuro/Psych negative neurological ROS  negative psych ROS   GI/Hepatic Neg liver ROS, GERD-  Medicated,  Endo/Other  diabetes, GestationalMorbid obesity   Renal/GU negative Renal ROS  negative genitourinary   Musculoskeletal negative musculoskeletal ROS (+) Arthritis -,   Abdominal (+) + obese,   Peds negative pediatric ROS (+)  Hematology negative hematology ROS (+) anemia ,   Anesthesia Other Findings   Reproductive/Obstetrics negative OB ROS                         Anesthesia Physical Anesthesia Plan  ASA: III  Anesthesia Plan: General   Post-op Pain Management:    Induction: Intravenous  Airway Management Planned: Oral ETT  Additional Equipment:   Intra-op Plan:   Post-operative Plan: Extubation in OR  Informed Consent: I have reviewed the patients History and Physical, chart, labs and discussed the procedure including the risks, benefits and alternatives for the proposed anesthesia with the patient or authorized representative who has indicated his/her understanding and acceptance.   Dental advisory given  Plan Discussed with: CRNA  Anesthesia Plan Comments: (Pulmonary symptoms of unclear etiology. Probably at increased risk for cardiopulmonary complications discussed with patient.)        Anesthesia Quick Evaluation

## 2015-07-26 NOTE — Anesthesia Procedure Notes (Signed)
Procedure Name: Intubation Date/Time: 07/26/2015 10:16 AM Performed by: Orest Dikes Pre-anesthesia Checklist: Patient identified, Emergency Drugs available, Suction available and Patient being monitored Patient Re-evaluated:Patient Re-evaluated prior to inductionOxygen Delivery Method: Circle System Utilized Preoxygenation: Pre-oxygenation with 100% oxygen Intubation Type: IV induction Ventilation: Mask ventilation without difficulty Laryngoscope Size: Mac and 4 Grade View: Grade I Tube type: Oral Tube size: 7.5 mm Number of attempts: 1 Airway Equipment and Method: Stylet Placement Confirmation: ETT inserted through vocal cords under direct vision,  positive ETCO2 and breath sounds checked- equal and bilateral Secured at: 21 cm Tube secured with: Tape Dental Injury: Teeth and Oropharynx as per pre-operative assessment

## 2015-07-26 NOTE — Anesthesia Postprocedure Evaluation (Signed)
  Anesthesia Post-op Note  Patient: Rachael Camacho  Procedure(s) Performed: Procedure(s) (LRB): LAPAROSCOPIC GASTRIC SLEEVE RESECTION (N/A) LAPAROSCOPIC REPAIR OF HIATAL HERNIA UPPER GI ENDOSCOPY  Patient Location: PACU  Anesthesia Type: General  Level of Consciousness: awake and alert   Airway and Oxygen Therapy: Patient Spontanous Breathing  Post-op Pain: mild  Post-op Assessment: Post-op Vital signs reviewed, Patient's Cardiovascular Status Stable, Respiratory Function Stable, Patent Airway and No signs of Nausea or vomiting. Denies SOB. OSA orders written.  Last Vitals:  Filed Vitals:   07/26/15 1355  BP: 152/93  Pulse: 55  Temp: 36.6 C  Resp: 14    Post-op Vital Signs: stable   Complications: No apparent anesthesia complications

## 2015-07-26 NOTE — Transfer of Care (Signed)
Immediate Anesthesia Transfer of Care Note  Patient: Rachael Camacho  Procedure(s) Performed: Procedure(s): LAPAROSCOPIC GASTRIC SLEEVE RESECTION (N/A) LAPAROSCOPIC REPAIR OF HIATAL HERNIA UPPER GI ENDOSCOPY  Patient Location: PACU  Anesthesia Type:General  Level of Consciousness:  sedated, patient cooperative and responds to stimulation  Airway & Oxygen Therapy:Patient Spontanous Breathing and Patient connected to face mask oxgen  Post-op Assessment:  Report given to PACU RN and Post -op Vital signs reviewed and stable  Post vital signs:  Reviewed and stable  Last Vitals:  Filed Vitals:   07/26/15 0848  BP: 137/79  Pulse: 71  Temp: 36.4 C  Resp: 18    Complications: No apparent anesthesia complications

## 2015-07-26 NOTE — Op Note (Signed)
Surgeon: Wenda Low, MD, FACS  Asst:  Ovidio Kin, MD,FACS  Anes:  General endotracheal  Procedure: Laparoscopic sleeve gastrectomy and upper endoscopy  Diagnosis: Morbid obesity  Complications: none  EBL:   15 cc  Description of Procedure:  The patient was take to OR 1 and given general anesthesia.  The abdomen was prepped with PCMX and draped sterilely.  A timeout was performed.  Access to the abdomen was achieved with a 5 mm Optiview through the left upper quadrant without difficulty.   quadrant.  Following insufflation, the state of the abdomen was found to be free of adhesions. A broad hiatus was noted anteriorally despite a negative UGI series. The balloon calibration was used and this prolapsed slightly up into the chest.  This was removed and the hiatus was repaired posteriorally with 2 sutures of [place] placed with the endostitch.      The pylorus was identified and we measured 5 cm back and marked the antrum.  At that point we began dissection to take down the greater curvature of the stomach using the Harmonic scalpel.  This dissection was taken all the way up to the left crus.  Posterior attachments of the stomach were also taken down.    The ViSiGi tube was then passed into the antrum and suction applied so that it was snug along the lessor curvature.  The "crow's foot" or incisura was identified.  The sleeve gastrectomy was begun using the [employer] stapler beginning with a 4.5 cm black Covidien load with TRS.  This was followed by a black 6 cm with TRS and then purple loads with TRS.  [name]  When the sleeve was complete the tube was taken off suction and insufflated briefly.  The tube was withdrawn.  Upper endoscopy was then performed by Dr. Ezzard Standing.     The specimen was extracted through the 15 trocar site using the Endobag.  Wounds were infiltrated with Exparel  and closed with 4-0 vicryl and Liquiban.    Matt B. Daphine Deutscher, MD, [facility] Surgery,  [place] [phone]

## 2015-07-26 NOTE — Op Note (Signed)
Name:  Rachael Camacho MRN: [id number] Date of Surgery: 07/26/2015  Preop Diagnosis:  Morbid Obesity  Postop Diagnosis:  Morbid Obesity (Weight - 250, BMI - 43) , S/P Gastric Sleeve  Procedure:  Upper endoscopy  (Intraoperative)  Surgeon:  Ovidio Kin, M.D.  Anesthesia:  GET  Indications for procedure: Rachael Camacho is a 49 y.o. female whose primary care physician is  Duane Lope, MD and has completed a Gastric Sleeve today by Dr. Daphine Deutscher.  I am doing an intraoperative upper endoscopy to evaluate the gastric pouch.  Operative Note: The patient is under general anesthesia.  Dr. Daphine Deutscher is laparoscoping the patient while I do an upper endoscopy to evaluate the stomach pouch.  With the patient intubated, I passed the Pentax upper endoscope without difficulty down the esophagus.  The esophago-gastric junction was at 39 cm.    The mucosa of the stomach looked viable and the staple line was intact without bleeding.  I advanced to the pylorus, but did not go through it.  While I insufflated the stomach pouch with air, Dr. Daphine Deutscher  flooded the upper abdomen with saline to put the gastric pouch under saline.  There was no bubbling or evidence of a leak.   There was no evidence of narrowing of the pouch and the gastric sleeve looked tubular.  The scope was then withdrawn.  The esophagus was unremarkable and the patient tolerated the endoscopy without difficulty.  Ovidio Kin, MD, [facility] Surgery Pager: [phone] Office phone:  [phone]

## 2015-07-26 NOTE — Progress Notes (Signed)
Spoke with pt regarding nocturnal cpap.  Pt stated she brought in her nasal pillows from home.  This RT was unable to adapt it to hospital-provided tubing.  Pt declined trying a nasal mask tonight.  Pt stated she will have family bring in her tubing from home tomorrow and will use cpap tomorrow night.  Pt currently on 2lnc, HR66, SPo2 94%.  Pt was advised that RT is available all night and encouraged her to call should she change her mind.  RN aware.

## 2015-07-27 ENCOUNTER — Encounter (HOSPITAL_COMMUNITY): Payer: Self-pay | Admitting: Surgery

## 2015-07-27 ENCOUNTER — Inpatient Hospital Stay (HOSPITAL_COMMUNITY): Payer: BLUE CROSS/BLUE SHIELD

## 2015-07-27 LAB — CBC WITH DIFFERENTIAL/PLATELET
Basophils Absolute: 0 10*3/uL (ref 0.0–0.1)
Basophils Relative: 0 % (ref 0–1)
Eosinophils Absolute: 0 10*3/uL (ref 0.0–0.7)
Eosinophils Relative: 0 % (ref 0–5)
HCT: 38.9 % (ref 36.0–46.0)
Hemoglobin: 12.9 g/dL (ref 12.0–15.0)
Lymphocytes Relative: 20 % (ref 12–46)
Lymphs Abs: 1.4 10*3/uL (ref 0.7–4.0)
MCH: 29.7 pg (ref 26.0–34.0)
MCHC: 33.2 g/dL (ref 30.0–36.0)
MCV: 89.6 fL (ref 78.0–100.0)
MONO ABS: 0.9 10*3/uL (ref 0.1–1.0)
MONOS PCT: 13 % — AB (ref 3–12)
NEUTROS ABS: 4.8 10*3/uL (ref 1.7–7.7)
NEUTROS PCT: 67 % (ref 43–77)
Platelets: 239 10*3/uL (ref 150–400)
RBC: 4.34 MIL/uL (ref 3.87–5.11)
RDW: 13.3 % (ref 11.5–15.5)
WBC: 7.1 10*3/uL (ref 4.0–10.5)

## 2015-07-27 LAB — HEMOGLOBIN AND HEMATOCRIT, BLOOD
HEMATOCRIT: 39 % (ref 36.0–46.0)
HEMOGLOBIN: 12.6 g/dL (ref 12.0–15.0)

## 2015-07-27 MED ORDER — IOHEXOL 300 MG/ML  SOLN
50.0000 mL | Freq: Once | INTRAMUSCULAR | Status: AC | PRN
  Administered 2015-07-27: 50 mL via ORAL

## 2015-07-27 MED ORDER — ACETAMINOPHEN 650 MG RE SUPP: 650.0000 mg | RECTAL | Status: DC | PRN

## 2015-07-27 NOTE — Care Management Note (Signed)
Case Management Note  Patient Details  Name: [name] MRN: [id number] Date of Birth: Feb 10, 1966  Subjective/Objective:              S/p Laparoscopic sleeve gastrectomy and upper endoscopy   Action/Plan: Discharge planning  Expected Discharge Date:  07/28/15               Expected Discharge Plan:  Home/Self Care  In-House Referral:  NA  Discharge planning Services  CM Consult  Post Acute Care Choice:  NA Choice offered to:  NA  DME Arranged:  N/A DME Agency:  NA  HH Arranged:  NA HH Agency:  NA  Status of Service:  Completed, signed off  Medicare Important Message Given:    Date Medicare IM Given:    Medicare IM give by:    Date Additional Medicare IM Given:    Additional Medicare Important Message give by:     If discussed at Long Length of Stay Meetings, dates discussed:    Additional Comments:  Alexis Goodell, RN 07/27/2015, 10:59 AM

## 2015-07-27 NOTE — Progress Notes (Signed)
Patient alert and oriented, Post op day 1.  Provided support and encouragement.  Encouraged pulmonary toilet, ambulation and small sips of liquids when swallow study returned satisfactorily.  All questions answered.  Will continue to monitor. 

## 2015-07-27 NOTE — Progress Notes (Signed)
NUTRITION NOTE  Consult for bariatric diet education. Pt not in room at this time and no family or visitors present. Left handout in pt's room for 2 week post-op diet and handout to help pt keep track of vitamin and mineral supplement intakes.  Please re-consult should be express questions or concerns related to information in handout.  2 ounces of Unjury has already been ordered QID.   Trenton Gammon, RD, LDN Inpatient Clinical Dietitian Pager # [phone] After hours/weekend pager # [phone]

## 2015-07-28 LAB — CBC WITH DIFFERENTIAL/PLATELET
Basophils Absolute: 0 10*3/uL (ref 0.0–0.1)
Basophils Relative: 0 % (ref 0–1)
EOS ABS: 0.1 10*3/uL (ref 0.0–0.7)
Eosinophils Relative: 1 % (ref 0–5)
HCT: 38.2 % (ref 36.0–46.0)
Hemoglobin: 12.5 g/dL (ref 12.0–15.0)
Lymphocytes Relative: 22 % (ref 12–46)
Lymphs Abs: 1.8 10*3/uL (ref 0.7–4.0)
MCH: 29 pg (ref 26.0–34.0)
MCHC: 32.7 g/dL (ref 30.0–36.0)
MCV: 88.6 fL (ref 78.0–100.0)
MONOS PCT: 11 % (ref 3–12)
Monocytes Absolute: 0.9 10*3/uL (ref 0.1–1.0)
NEUTROS ABS: 5.4 10*3/uL (ref 1.7–7.7)
Neutrophils Relative %: 66 % (ref 43–77)
Platelets: 195 10*3/uL (ref 150–400)
RBC: 4.31 MIL/uL (ref 3.87–5.11)
RDW: 13.4 % (ref 11.5–15.5)
WBC: 8.2 10*3/uL (ref 4.0–10.5)

## 2015-07-28 NOTE — Progress Notes (Signed)
Patient ID: Rachael Camacho, female   DOB: [birth year], 49 y.o.   MRN: [id number] [facility] Surgery Progress Note:   2 Days Post-Op  Subjective: Mental status is clear.  Seen by me yesterday during UGI which was OK Objective: Vital signs in last 24 hours: Temp:  [98 F (36.7 C)-98.9 F (37.2 C)] 98.9 F (37.2 C) (08/04 0625) Pulse Rate:  [52-78] 78 (08/04 0625) Resp:  [18] 18 (08/04 0625) BP: (135-148)/(65-77) 139/77 mmHg (08/04 0625) SpO2:  [89 %-100 %] 96 % (08/04 0625)  Intake/Output from previous day: 08/03 0701 - 08/04 0700 In: 2368.3 [I.V.:2368.3] Out: 2100 [Urine:2100] Intake/Output this shift:    Physical Exam: Work of breathing is normal.  Minimal pain but some congestion-not reflux  Lab Results:  Results for orders placed or performed during the hospital encounter of 07/26/15 (from the past 48 hour(s))  Hemoglobin and hematocrit, blood     Status: None   Collection Time: 07/26/15  1:25 PM  Result Value Ref Range   Hemoglobin 13.8 12.0 - 15.0 g/dL   HCT 42.5 36.0 - 97.3 %  Basic metabolic panel     Status: Abnormal   Collection Time: 07/26/15  1:25 PM  Result Value Ref Range   Sodium 138 135 - 145 mmol/L   Potassium 4.3 3.5 - 5.1 mmol/L   Chloride 109 101 - 111 mmol/L   CO2 23 22 - 32 mmol/L   Glucose, Bld 118 (H) 65 - 99 mg/dL   BUN 15 6 - 20 mg/dL   Creatinine, Ser 0.82 0.44 - 1.00 mg/dL   Calcium 8.7 (L) 8.9 - 10.3 mg/dL   GFR calc non Af Amer >60 >60 mL/min   GFR calc Af Amer >60 >60 mL/min    Comment: (NOTE) The eGFR has been calculated using the CKD EPI equation. This calculation has not been validated in all clinical situations. eGFR's persistently <60 mL/min signify possible Chronic Kidney Disease.    Anion gap 6 5 - 15  CBC     Status: None   Collection Time: 07/26/15  1:27 PM  Result Value Ref Range   WBC 8.6 4.0 - 10.5 K/uL   RBC 4.72 3.87 - 5.11 MIL/uL   Hemoglobin 13.9 12.0 - 15.0 g/dL   HCT 41.8 36.0 - 46.0 %   MCV 88.6 78.0 - 100.0  fL   MCH 29.4 26.0 - 34.0 pg   MCHC 33.3 30.0 - 36.0 g/dL   RDW 13.3 11.5 - 15.5 %   Platelets 185 150 - 400 K/uL  CBC WITH DIFFERENTIAL     Status: Abnormal   Collection Time: 07/27/15  4:40 AM  Result Value Ref Range   WBC 7.1 4.0 - 10.5 K/uL   RBC 4.34 3.87 - 5.11 MIL/uL   Hemoglobin 12.9 12.0 - 15.0 g/dL   HCT 38.9 36.0 - 46.0 %   MCV 89.6 78.0 - 100.0 fL   MCH 29.7 26.0 - 34.0 pg   MCHC 33.2 30.0 - 36.0 g/dL   RDW 13.3 11.5 - 15.5 %   Platelets 239 150 - 400 K/uL   Neutrophils Relative % 67 43 - 77 %   Neutro Abs 4.8 1.7 - 7.7 K/uL   Lymphocytes Relative 20 12 - 46 %   Lymphs Abs 1.4 0.7 - 4.0 K/uL   Monocytes Relative 13 (H) 3 - 12 %   Monocytes Absolute 0.9 0.1 - 1.0 K/uL   Eosinophils Relative 0 0 - 5 %   Eosinophils Absolute  0.0 0.0 - 0.7 K/uL   Basophils Relative 0 0 - 1 %   Basophils Absolute 0.0 0.0 - 0.1 K/uL  Hemoglobin and hematocrit, blood     Status: None   Collection Time: 07/27/15  4:10 PM  Result Value Ref Range   Hemoglobin 12.6 12.0 - 15.0 g/dL   HCT 39.0 36.0 - 46.0 %  CBC with Differential     Status: None   Collection Time: 07/28/15  4:55 AM  Result Value Ref Range   WBC 8.2 4.0 - 10.5 K/uL   RBC 4.31 3.87 - 5.11 MIL/uL   Hemoglobin 12.5 12.0 - 15.0 g/dL   HCT 38.2 36.0 - 46.0 %   MCV 88.6 78.0 - 100.0 fL   MCH 29.0 26.0 - 34.0 pg   MCHC 32.7 30.0 - 36.0 g/dL   RDW 13.4 11.5 - 15.5 %   Platelets 195 150 - 400 K/uL   Neutrophils Relative % 66 43 - 77 %   Neutro Abs 5.4 1.7 - 7.7 K/uL   Lymphocytes Relative 22 12 - 46 %   Lymphs Abs 1.8 0.7 - 4.0 K/uL   Monocytes Relative 11 3 - 12 %   Monocytes Absolute 0.9 0.1 - 1.0 K/uL   Eosinophils Relative 1 0 - 5 %   Eosinophils Absolute 0.1 0.0 - 0.7 K/uL   Basophils Relative 0 0 - 1 %   Basophils Absolute 0.0 0.0 - 0.1 K/uL    Radiology/Results: Dg Ugi W/water Sol Cm  07/27/2015   CLINICAL DATA:  49 year old female status post sleeve gastrectomy yesterday.  EXAM: WATER SOLUBLE UPPER GI SERIES   TECHNIQUE: Single-column upper GI series was performed using water soluble contrast.  CONTRAST:  59m OMNIPAQUE IOHEXOL 300 MG/ML  SOLN  COMPARISON:  No priors.  FLUOROSCOPY TIME:  If the device does not provide the exposure index:  Fluoroscopy Time (in minutes and seconds):  1 minutes and 20 seconds  Number of Acquired Images:  24  FINDINGS: Initial KUB demonstrated some surgical sutures in the epigastric region. Subsequently, following ingestion of water soluble oral contrast the gastric lumen was opacified. The gastric lumen appeared narrowed, compatible with recent sleeve gastrectomy. Contrast readily traversed the pylorus into the proximal small bowel. No extravasation of contrast was observed during the examination.  IMPRESSION: 1. Expected postoperative appearance following sleeve gastrectomy, as above, without evidence of extravasation or obstruction.   Electronically Signed   By: DVinnie LangtonM.D.   On: 07/27/2015 12:04    Anti-infectives: Anti-infectives    Start     Dose/Rate Route Frequency Ordered Stop   07/26/15 0847  cefOXitin (MEFOXIN) 2 g in dextrose 5 % 50 mL IVPB     2 g 100 mL/hr over 30 Minutes Intravenous On call to O.R. 07/26/15 0847 07/26/15 1023      Assessment/Plan: Problem List: Patient Active Problem List   Diagnosis Date Noted  . Pulmonary hypertension 05/21/2015  . OSA (obstructive sleep apnea) 01/26/2015  . Dyspnea 11/10/2014  . Morbid obesity 11/10/2014  . Right radial head fracture 07/28/2014    Will begin PD 2 bariatric diet.  Plan discharge if tolerated.   2 Days Post-Op    LOS: 2 days   Matt B. MHassell Done MD, FCornerstone Regional Hospital[facility], P.[facility] [phone] [phone] 07/28/2015 8:47 AM

## 2015-07-28 NOTE — Progress Notes (Signed)
Patient alert and oriented, pain is controlled. Patient is tolerating fluids,  advanced to protein shake today, patient tolerated well.  Reviewed Gastric sleeve discharge instructions with patient and patient is able to articulate understanding.  Provided information on BELT program, Support Group and [facility] outpatient pharmacy. All questions answered, will continue to monitor.  

## 2015-07-28 NOTE — Discharge Instructions (Signed)

## 2015-07-28 NOTE — Progress Notes (Signed)
Assessment unchanged. Pt verbalized understanding of dc instructions through teach back. Skip Estimable, RN completed bariatric instructions as well as dc instructions. Pt understands when to follow up with MD. Discharged via wc to front entrance to meet awaiting vehicle to carry home. Accompanied by NT and husband.

## 2015-07-28 NOTE — Discharge Summary (Signed)
Physician Discharge Summary  Patient ID: ALMEE PELPHREY MRN: [id number] DOB/AGE: 05-10-1966 49 y.o.  Admit date: 07/26/2015 Discharge date: 07/28/2015  Admission Diagnoses:  Morbid obesity  Discharge Diagnoses:  same  Active Problems:   Morbid obesity   Surgery:  Lap sleeve gastrectomy  Discharged Condition: improved  Hospital Course:   Had sleeve gastrectomy.  UGI on PD 1 OK.  Diet advanced and ready for discharge on PD 23  Consults: none  Significant Diagnostic Studies: UGI    Discharge Exam: Blood pressure 139/77, pulse 78, temperature 98.9 F (37.2 C), temperature source Oral, resp. rate 18, height 5' 4.5" (1.638 m), weight 107.162 kg (236 lb 4 oz), SpO2 96 %. Incisions OK.    Disposition: 01-Home or Self Care  Discharge Instructions    Ambulate hourly while awake    Complete by:  As directed      Call MD for:  difficulty breathing, headache or visual disturbances    Complete by:  As directed      Call MD for:  persistant dizziness or light-headedness    Complete by:  As directed      Call MD for:  persistant nausea and vomiting    Complete by:  As directed      Call MD for:  redness, tenderness, or signs of infection (pain, swelling, redness, odor or green/yellow discharge around incision site)    Complete by:  As directed      Call MD for:  severe uncontrolled pain    Complete by:  As directed      Call MD for:  temperature >101 F    Complete by:  As directed      Diet bariatric full liquid    Complete by:  As directed      Discharge instructions    Complete by:  As directed   Stay on bariatric dietary guidelines     Incentive spirometry    Complete by:  As directed   Perform hourly while awake            Medication List    TAKE these medications        aspirin 81 MG tablet  Take 81 mg by mouth at bedtime.     clotrimazole-betamethasone cream  Commonly known as:  LOTRISONE  Apply 1 application topically 3 (three) times daily as needed (rash).     desloratadine 5 MG tablet  Commonly known as:  CLARINEX  Take 5 mg by mouth daily as needed (allergies.).     esomeprazole 40 MG capsule  Commonly known as:  NEXIUM  Take 40 mg by mouth at bedtime.     fluticasone 50 MCG/ACT nasal spray  Commonly known as:  FLONASE  Place 1 spray into the nose daily as needed for allergies or rhinitis.     furosemide 20 MG tablet  Commonly known as:  LASIX  Take 1 tablet (20 mg total) by mouth daily.     ibuprofen 200 MG tablet  Commonly known as:  ADVIL,MOTRIN  Take 600-800 mg by mouth every 6 (six) hours as needed.     OXYGEN  Place into the nose as needed (upon exertion).     SOOLANTRA 1 % Crea  Generic drug:  Ivermectin  Apply 1 application topically daily as needed (rosacea.).           Follow-up Information    Follow up with Valarie Merino, MD.   Specialty:  General Surgery   Contact information:  [address] [place] [address] [phone]       Signed: CORINE, SOLORIO 07/28/2015, 8:52 AM

## 2015-07-29 ENCOUNTER — Telehealth (HOSPITAL_COMMUNITY): Payer: Self-pay

## 2015-07-29 NOTE — Telephone Encounter (Signed)
Made discharge phone call to patient per DROP protocol. Asking the following questions.    1. Do you have someone to care for you now that you are home?  yes 2. Are you having pain now that is not relieved by your pain medication?  No, not taking pain medication 3. Are you able to drink the recommended daily amount of fluids (48 ounces minimum/day) and protein (60-80 grams/day) as prescribed by the dietitian or nutritional counselor?  Did not yesterday, but I am working on it today, everything tastes off so it is hard... 4. Are you taking the vitamins and minerals as prescribed?  yes 5. Do you have the "on call" number to contact your surgeon if you have a problem or question?  yes 6. Are your incisions free of redness, swelling or drainage? (If steri strips, address that these can fall off, shower as tolerated) yes 7. Have your bowels moved since your surgery?  If not, are you passing gas?  No, yes 8. Are you up and walking 3-4 times per day?  yes    1. Do you have an appointment made to see your surgeon in the next month?  yes 2. Were you provided your discharge medications before your surgery or before you were discharged from the hospital and are you taking them without problem?  yes 3. Were you provided phone numbers to the clinic/surgeon's office?  yes 4. Did you watch the patient education video module in the (clinic, surgeon's office, etc.) before your surgery? no 5. Do you have a discharge checklist that was provided to you in the hospital to reference with instructions on how to take care of yourself after surgery?  yes 6. Did you see a dietitian or nutritional counselor while you were in the hospital?  yes 7. Do you have an appointment to see a dietitian or nutritional counselor in the next month?  yes

## 2015-08-05 ENCOUNTER — Encounter: Payer: Self-pay | Admitting: Dietician

## 2015-08-05 ENCOUNTER — Encounter: Payer: BLUE CROSS/BLUE SHIELD | Attending: Family Medicine | Admitting: Dietician

## 2015-08-05 DIAGNOSIS — Z6841 Body Mass Index (BMI) 40.0 and over, adult: Secondary | ICD-10-CM | POA: Diagnosis not present

## 2015-08-05 DIAGNOSIS — Z713 Dietary counseling and surveillance: Secondary | ICD-10-CM | POA: Diagnosis not present

## 2015-08-05 NOTE — Progress Notes (Signed)
  Follow-up visit:  10 days Post-Operative Sleeve gastrectomy Surgery  Medical Nutrition Therapy:  Appt start time: 1100 end time:  1150.  Primary concerns today: Post-operative Bariatric Surgery Nutrition Management.  Rachael Camacho returns having lost 25 pounds since pre op class. Suspect error in [place] body composition scale. Rachael Camacho reports that she has been waking up in the night with hunger pains. She does not eat during the night. She is also having a hard time meeting fluid needs. No longer likes the taste of water. She has added some soft protein foods and is tolerating them.  Surgery date: 07/26/2015 Surgery type: Sleeve Gastrectomy Start weight at [facility]: 251 lbs on 03/26/15 Weight today: 226.5 lbs Weight change: 25.5 lbs Total weight lost: 25.5 lbs   TANITA  BODY COMP RESULTS  07/11/15 08/05/15   BMI (kg/m^2) 42.6 38.3   Fat Mass (lbs) 107 102.5   Fat Free Mass (lbs) 145 124   Total Body Water (lbs) 106 91    Preferred Learning Style:   No preference indicated   Learning Readiness:  Ready  24-hr recall: B (AM): 1/2 protein shake (15g) Snk (AM):   L (PM): 1/2 egg or 1 oz chicken or fish (4-7g) D (PM): 4-6 oz broth with Unjury protein powder (10g)  Snk (PM): sugar free popsicle  Fluid intake: decaf hot tea, sugar free popsicle, water with sugar free flavoring, sugar free jello, broth (insufficient per patient) Estimated total protein intake: 30-40 g  Medications: resumed taking Nexium; has not started Lasix back Supplementation: taking  Using straws:  Drinking while eating:  Hair loss:  Carbonated beverages:  N/V/D/C: "no GI issues" Dumping syndrome: none  Recent physical activity:  Did not assess today  Progress Towards Goal(s):  In progress.  Handouts given during visit include:  Phase 3A lean protein foods   Nutritional Diagnosis:  [place]-3.3 Overweight/obesity related to past poor dietary habits and physical inactivity as evidenced by patient w/ recent sleeve  gastrectomy surgery following dietary guidelines for continued weight loss.     Intervention:  Nutrition counseling provided.  Teaching Method Utilized:  Visual Auditory Hands on  Barriers to learning/adherence to lifestyle change: taste aversions  Demonstrated degree of understanding via:  Teach Back   Monitoring/Evaluation:  Dietary intake, exercise, and body weight. Follow up in 6 weeks for 2 month post-op visit.

## 2015-08-05 NOTE — Patient Instructions (Signed)
-  Try adding lemon or lime or other fruit to your water

## 2015-08-09 ENCOUNTER — Ambulatory Visit: Payer: BLUE CROSS/BLUE SHIELD

## 2015-08-16 ENCOUNTER — Ambulatory Visit: Payer: BLUE CROSS/BLUE SHIELD

## 2015-08-22 ENCOUNTER — Telehealth: Payer: Self-pay | Admitting: Pulmonary Disease

## 2015-08-22 DIAGNOSIS — G4733 Obstructive sleep apnea (adult) (pediatric): Secondary | ICD-10-CM

## 2015-08-22 NOTE — Telephone Encounter (Signed)
Order entered. Patient notified while in office with spouse during his OV with Dr. Vassie Loll. Nothing further needed.

## 2015-08-22 NOTE — Telephone Encounter (Signed)
Lost 30 lbs after bariatric surgery Wants to come off CPAP Rpt home study on O2 alone (off cpap)

## 2015-09-03 DIAGNOSIS — G4733 Obstructive sleep apnea (adult) (pediatric): Secondary | ICD-10-CM

## 2015-09-08 ENCOUNTER — Telehealth: Payer: Self-pay | Admitting: Pulmonary Disease

## 2015-09-08 NOTE — Telephone Encounter (Signed)
Ok to d/c CPAP Stay on Oxygen at night

## 2015-09-09 NOTE — Telephone Encounter (Signed)
Patient notified. Patient wants to know if she needs to increase or O2 at night? What do you want her to stay on? Please advise.

## 2015-09-09 NOTE — Telephone Encounter (Signed)
Patient notified.  Nothing further needed. 

## 2015-09-09 NOTE — Telephone Encounter (Signed)
Stay on 2L o2 during sleep

## 2015-09-13 DIAGNOSIS — G4733 Obstructive sleep apnea (adult) (pediatric): Secondary | ICD-10-CM | POA: Diagnosis not present

## 2015-09-14 ENCOUNTER — Encounter: Payer: Self-pay | Admitting: Pulmonary Disease

## 2015-09-15 ENCOUNTER — Other Ambulatory Visit: Payer: Self-pay | Admitting: *Deleted

## 2015-09-15 DIAGNOSIS — G4733 Obstructive sleep apnea (adult) (pediatric): Secondary | ICD-10-CM

## 2015-09-19 ENCOUNTER — Encounter: Payer: Self-pay | Admitting: Dietician

## 2015-09-19 ENCOUNTER — Encounter: Payer: BLUE CROSS/BLUE SHIELD | Attending: Family Medicine | Admitting: Dietician

## 2015-09-19 DIAGNOSIS — Z713 Dietary counseling and surveillance: Secondary | ICD-10-CM | POA: Insufficient documentation

## 2015-09-19 DIAGNOSIS — Z6841 Body Mass Index (BMI) 40.0 and over, adult: Secondary | ICD-10-CM | POA: Diagnosis not present

## 2015-09-19 NOTE — Patient Instructions (Addendum)
Goals:  Follow Phase 3B: High Protein + Non-Starchy Vegetables  Eat 3-6 small meals/snacks, every 3-5 hrs  Increase lean protein foods to meet 60g goal  Keep working on getting 64oz + of fluid per day  Take a Magnesium supplement with Calcium 1x a day   Avoid drinking 15 minutes before, during and 30 minutes after eating  Try NatureMade Complete multivitamin   Keep focusing on non scale victories!  Surgery date: 07/26/2015 Surgery type: Sleeve Gastrectomy Start weight at [facility]: 251 lbs on 03/26/15 Weight today: 212.5 lbs Weight change: 14 lbs (22 lbs fat) Total weight lost: 38.5 lbs   TANITA  BODY COMP RESULTS  07/11/15 08/05/15 09/19/15   BMI (kg/m^2) 42.6 38.3 35.9   Fat Mass (lbs) 107 102.5 80.5   Fat Free Mass (lbs) 145 124 132   Total Body Water (lbs) 106 91 96.5

## 2015-09-19 NOTE — Progress Notes (Signed)
  Follow-up visit:  6 weeks Post-Operative Sleeve gastrectomy Surgery  Medical Nutrition Therapy:  Appt start time: 825 end time:  905  Primary concerns today: Post-operative Bariatric Surgery Nutrition Management.  Rachael Camacho returns having lost another 14 pounds since last visit. She reports having trouble eating meats but feels like she's meeting her protein needs most days. Eating nuts and crackers (4 at a time with cheese) and small amounts of chocolate in the evenings.   Surgery date: 07/26/2015 Surgery type: Sleeve Gastrectomy Start weight at [facility]: 251 lbs on 03/26/15 Weight today: 212.5 lbs Weight change: 14 lbs Total weight lost: 38.5 lbs   TANITA  BODY COMP RESULTS  07/11/15 08/05/15 09/19/15   BMI (kg/m^2) 42.6 38.3 35.9   Fat Mass (lbs) 107 102.5 80.5   Fat Free Mass (lbs) 145 124 132   Total Body Water (lbs) 106 91 96.5    Preferred Learning Style:   No preference indicated   Learning Readiness:  Ready  24-hr recall: B (AM): 1/2 protein shake (15g) Snk (10-10:30 AM): peanuts (5g) L (PM): 1.5 oz chicken and cheese + refried beans (10-14g) Snk: string cheese and 4 crackers (7g) D (PM): 1.5 oz grilled chicken and veggies (10g) Snk (PM): peanuts or peanut butter on 4 crackers (5g)  Fluid intake: 32 oz water, 11 oz protein shake (improving per patient) Estimated total protein intake: 52-60 g  Medications: resumed taking Nexium; has not started Lasix back, taking probiotic Supplementation: taking, having a hard time with nausea  Drinking while eating: sometimes with snacks Hair loss: some shedding Carbonated beverages:  N/V/D/C: constipation, enema then Miralax + colace per surgeon  Dumping syndrome: none  Recent physical activity: water aerobics and treadmill 2-3x a week and arm weights; working on being active overall  Progress Towards Goal(s):  In progress.  Handouts given during visit include:  Phase 3B lean protein + non starchy vegetables  Bariatric snack  ideas   Nutritional Diagnosis:  [place]-3.3 Overweight/obesity related to past poor dietary habits and physical inactivity as evidenced by patient w/ recent sleeve gastrectomy surgery following dietary guidelines for continued weight loss.     Intervention:  Nutrition counseling provided.  Teaching Method Utilized:  Visual Auditory Hands on  Barriers to learning/adherence to lifestyle change: taste aversions  Demonstrated degree of understanding via:  Teach Back   Monitoring/Evaluation:  Dietary intake, exercise, and body weight. Follow up in 6 weeks for 2.5 month post-op visit.

## 2015-10-12 ENCOUNTER — Ambulatory Visit (HOSPITAL_COMMUNITY)
Admission: RE | Admit: 2015-10-12 | Discharge: 2015-10-12 | Disposition: A | Discharge status: Home / Self Care | Payer: BLUE CROSS/BLUE SHIELD | Source: Ambulatory Visit | Attending: Internal Medicine | Admitting: Internal Medicine

## 2015-10-12 ENCOUNTER — Encounter (HOSPITAL_COMMUNITY): Payer: Self-pay | Admitting: Internal Medicine

## 2015-10-12 VITALS — BP 104/60 | HR 65 | Wt 205.5 lb

## 2015-10-12 DIAGNOSIS — I272 Other secondary pulmonary hypertension: Secondary | ICD-10-CM | POA: Diagnosis not present

## 2015-10-12 DIAGNOSIS — I5032 Chronic diastolic (congestive) heart failure: Secondary | ICD-10-CM | POA: Diagnosis not present

## 2015-10-12 DIAGNOSIS — Z8249 Family history of ischemic heart disease and other diseases of the circulatory system: Secondary | ICD-10-CM | POA: Insufficient documentation

## 2015-10-12 DIAGNOSIS — Z79899 Other long term (current) drug therapy: Secondary | ICD-10-CM | POA: Insufficient documentation

## 2015-10-12 DIAGNOSIS — J9610 Chronic respiratory failure, unspecified whether with hypoxia or hypercapnia: Secondary | ICD-10-CM | POA: Diagnosis not present

## 2015-10-12 DIAGNOSIS — I7300 Raynaud's syndrome without gangrene: Secondary | ICD-10-CM | POA: Diagnosis not present

## 2015-10-12 DIAGNOSIS — E669 Obesity, unspecified: Secondary | ICD-10-CM | POA: Diagnosis not present

## 2015-10-12 DIAGNOSIS — E661 Drug-induced obesity: Secondary | ICD-10-CM | POA: Diagnosis not present

## 2015-10-12 DIAGNOSIS — Z825 Family history of asthma and other chronic lower respiratory diseases: Secondary | ICD-10-CM | POA: Insufficient documentation

## 2015-10-12 DIAGNOSIS — G4733 Obstructive sleep apnea (adult) (pediatric): Secondary | ICD-10-CM | POA: Insufficient documentation

## 2015-10-12 DIAGNOSIS — K219 Gastro-esophageal reflux disease without esophagitis: Secondary | ICD-10-CM | POA: Insufficient documentation

## 2015-10-12 DIAGNOSIS — M349 Systemic sclerosis, unspecified: Secondary | ICD-10-CM | POA: Insufficient documentation

## 2015-10-12 DIAGNOSIS — Z9884 Bariatric surgery status: Secondary | ICD-10-CM | POA: Diagnosis not present

## 2015-10-12 NOTE — Patient Instructions (Signed)
Your physician has requested that you have an echocardiogram. Echocardiography is a painless test that uses sound waves to create images of your heart. It provides your doctor with information about the size and shape of your heart and how well your heart's chambers and valves are working. This procedure takes approximately one hour. There are no restrictions for this procedure.  Your physician has recommended that you have a pulmonary function test. Pulmonary Function Tests are a group of tests that measure how well air moves in and out of your lungs.  We will contact you in 3 months to schedule your next appointment.

## 2015-10-12 NOTE — Progress Notes (Signed)
Advanced Heart Failure Clinic Note   Patient ID: Rachael Camacho, female   DOB: 01/02/1966, 49 y.o.   MRN: [id number] PCP: Primary Cardiologist: Dr Jens Somrenshaw.  Primary HF: Dr Gala RomneyBensimhon Pulmonary: Dr Vassie LollAlva.   HPI: Ms Rachael Camacho is a 49 year old with a history of  OSA, chornic diastolic HF, obesity, scleroderma, and raynauds disease referred to HF clinic by Dr Vassie LollAlva.   A year ago she was exercising without difficulty. She was able to do ballroom dancing and walking on the treadmill a mile or two without difficulty. Last September she noticed dyspnea with steps. Had another function decline in October and had increased dyspnea walking a block.  Symptoms progressed in October and now SOB with minimal activity. She says her weight has been 250 pounds for the last 2 years.  Has been seen by Dr Vassie LollAlva and had PFTs as below. Also referred for RHC in 3/16 with mild PH but PVR 2.8 WU.[name]. Started 20 mg po lasix in the fall of last year.   She reports today for Pulmonary hypertension follow up.  She had gastric bypass surgery on 07/26/15 and is down 47 lbs so far. She feels great and has been getting a round much better.  Can walk around her neighborhood and up her work stairs with minimal SOB, where as before she was dyspneic walking back to the clinic. No fatigue with ADLs. Doing water aerobics several times a week. Still taking 20 mg lasix daily and has occasional ankle swelling with prolonged sitting. No scleroderma flares. + Raynaud's  Studies/Tests   01/2015- 93% at rest but non walking she desaturate to 84%   Echo 11/12/14 showed grade 2 diastolic dysfunction, with normal systolic function  CT angio 11/2014 was negative for pulmonary embolism, but showed mosaic pattern suggestive of early interstitial edema >> no response to Lasix  Home sleep study 02/22/15.  Consistent with mild sleep apnea Desaturation noted.   PFTs 03/09/15 no obstruction, mild restriction TLC 78%, DLCO 52% predicted  RHC [facility] 03/14/2015    RA 13/12 mean 10 mm Hg RV 53/11 mm Hg PA 50/19 mean 33 mm Hg  PCWP 18/16 mean 14 mm Hg LV 169/13/22 mm Hg  AO 165/92 mean 119 mm Hg Oxygen saturations: PA 73% AO 95% Cardiac Output (Fick) 6.9 L/min  Cardiac Index (Fick) 3.2 L/min/meter squared PVR = 2.8 WU No significant CAD    ROS: All systems negative except as listed in HPI, PMH and Problem List.  SH:  Social History   Social History  . Marital Status: Married    Spouse Name: N/A  . Number of Children: 2  . 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 Smoking status: Never Smoker   . Smokeless tobacco: Not on file  . Alcohol Use: 0.0 oz/week    0 Standard drinks or equivalent per week     Comment: occ  . Drug Use: No  . Sexual Activity: Not on file   Other Topics Concern  . Not on file   Social History Narrative    FH:  Family History  Problem Relation Age of Onset  . Heart failure Mother     Stent put in 2014 for blockage  . COPD Mother   . Heart disease Father   . Heart disease Son     Repair ASD    Past Medical History  Diagnosis Date  .  GERD (gastroesophageal reflux disease)   . Arthritis   . Raynaud disease     hands-toes  . Wears glasses   . IBS (irritable bowel syndrome)   . Sleep apnea   . Shortness of breath dyspnea     pulmonary fibrosis-scleraderma  . Anemia     as child in first grade  . Gestational diabetes mellitus in childbirth, diet controlled 1987  . Family history of adverse reaction to anesthesia     Current Outpatient Prescriptions  Medication Sig Dispense Refill  . B-12, METHYLCOBALAMIN, SL Place 1 application under the tongue daily.    [name] CALCIUM CITRATE PO Take 500 mg by mouth 3 (three) times daily.    . clotrimazole-betamethasone (LOTRISONE) cream Apply 1 application topically 3 (three) times daily as needed (rash).    [name] desloratadine (CLARINEX) 5 MG tablet Take 5 mg by mouth daily as needed  (allergies.).   12  . docusate sodium (COLACE) 100 MG capsule Take 100 mg by mouth daily.    . fluticasone (FLONASE) 50 MCG/ACT nasal spray Place 1 spray into the nose daily as needed for allergies or rhinitis.     . furosemide (LASIX) 20 MG tablet Take 1 tablet (20 mg total) by mouth daily. 90 tablet 3  . Ivermectin (SOOLANTRA) 1 % CREA Apply 1 application topically daily as needed (rosacea.).    [name] Multiple Vitamins-Minerals (MULTIVITAMIN WITH MINERALS) tablet Take 1 tablet by mouth 2 (two) times daily.    . OXYGEN Place 2 L into the nose at bedtime.     . Probiotic Product (4X PROBIOTIC) TABS Take 1 tablet by mouth daily.     No current facility-administered medications for this encounter.    Filed Vitals:   10/12/15 1406  BP: 104/60  Pulse: 65  Weight: 205 lb 8 oz (93.214 kg)  SpO2: 100%    PHYSICAL EXAM:  General:  Well appearing. No resp distress HEENT: normal Neck: supple. JVP flat Carotids 2+ bilaterally; no bruits. No thyromegaly or nodules. Cor: PMI normal. RRR. No rubs, gallops or murmurs. Non accentuated P2 Lungs: CTA, normal effort Abdomen: overweight, soft, NT, ND. No HSM. No bruits or masses. +BS Extremities: no cyanosis, clubbing, rash, trace-1+ ankle edema Neuro: alert & orientedx3, cranial nerves grossly intact. Moves all 4 extremities w/o difficulty. Affect pleasant   ASSESSMENT & PLAN:  1. Pulmonary HTN 2. Chronic Diastolic HF 3. OSA - CPAP per Dr Vassie Loll 4. Scleroderma 5. Chronic respiratory failure with exertional desaturations 6. Obesity s/p gastric bypass 07/26/15  Feels a lot better with weight down. From NYHA IIIb Symptoms to NYHA II.  Will get Echo and PFTs with DLCO.   Follow up in 3 months.  Graciella Freer, PA-C 2:29 PM   Patient seen and examined with Otilio Saber, PA-C. We discussed all aspects of the encounter. I agree with the assessment and plan as stated above.   She is much improved after gastric bypass surgery and marked  weight loss. Functional capacity and DOE much better. Still at risk for progressive PAH from scleroderma however. Will get repeat echo and PFTs with DLCO. If pulmonary pressures increasing or DLCO worse will repeat RHC. Congratulated her on weight loss.   Deondrea Aguado,MD 4:45 PM

## 2015-10-12 NOTE — Progress Notes (Signed)
Advanced Heart Failure Medication Review by a Pharmacist  Does the patient  feel that his/her medications are working for him/her?  yes  Has the patient been experiencing any side effects to the medications prescribed?  no  Does the patient measure his/her own blood pressure or blood glucose at home?  no   Does the patient have any problems obtaining medications due to transportation or finances?   no  Understanding of regimen: good Understanding of indications: good Potential of compliance: good Patient understands to avoid NSAIDs. Patient understands to avoid decongestants.  Issues to address at subsequent visits: None   Pharmacist comments:  Ms. Rachael Camacho is a pleasant 49 yo F presenting without a medication list. She reports good compliance with all of her medications. She did recently have bariatric surgery in August and has been off of her aspirin since then. Unsure of indication, may be primary prevention. Will ask Dr. Gala RomneyBensimhon to address.  Tyler DeisErika K. Bonnye FavaNicolsen, PharmD, BCPS, CPP Clinical Pharmacist Pager: [phone] Phone: [phone] 10/12/2015 2:36 PM    Time with patient: 4 minutes Preparation and documentation time: 2 minutes Total time: 6 minutes

## 2015-10-25 ENCOUNTER — Ambulatory Visit (HOSPITAL_COMMUNITY)
Admission: RE | Admit: 2015-10-25 | Discharge: 2015-10-25 | Disposition: A | Discharge status: Home / Self Care | Payer: BLUE CROSS/BLUE SHIELD | Source: Ambulatory Visit | Attending: Internal Medicine | Admitting: Internal Medicine

## 2015-10-25 ENCOUNTER — Other Ambulatory Visit: Payer: Self-pay

## 2015-10-25 ENCOUNTER — Ambulatory Visit (HOSPITAL_BASED_OUTPATIENT_CLINIC_OR_DEPARTMENT_OTHER): Payer: BLUE CROSS/BLUE SHIELD

## 2015-10-25 DIAGNOSIS — I272 Other secondary pulmonary hypertension: Secondary | ICD-10-CM

## 2015-10-25 LAB — PULMONARY FUNCTION TEST
DL/VA % pred: 66 %
DL/VA: 3.1 ml/min/mmHg/L
DLCO unc % pred: 58 %
DLCO unc: 13.29 ml/min/mmHg
FEF 25-75 Post: 3.34 L/sec
FEF 25-75 Pre: 3.4 L/sec
FEF2575-%Change-Post: -1 %
FEF2575-%PRED-POST: 124 %
FEF2575-%Pred-Pre: 126 %
FEV1-%CHANGE-POST: 0 %
FEV1-%PRED-PRE: 101 %
FEV1-%Pred-Post: 101 %
FEV1-PRE: 2.75 L
FEV1-Post: 2.75 L
FEV1FVC-%Change-Post: 3 %
FEV1FVC-%PRED-PRE: 104 %
FEV6-%Change-Post: -3 %
FEV6-%Pred-Post: 95 %
FEV6-%Pred-Pre: 99 %
FEV6-POST: 3.17 L
FEV6-PRE: 3.29 L
FEV6FVC-%Change-Post: 0 %
FEV6FVC-%PRED-POST: 103 %
FEV6FVC-%PRED-PRE: 103 %
FVC-%Change-Post: -3 %
FVC-%PRED-POST: 93 %
FVC-%Pred-Pre: 96 %
FVC-POST: 3.17 L
FVC-Pre: 3.29 L
PRE FEV6/FVC RATIO: 100 %
Post FEV1/FVC ratio: 87 %
Post FEV6/FVC ratio: 100 %
Pre FEV1/FVC ratio: 84 %
RV % pred: 73 %
RV: 1.28 L
TLC % PRED: 95 %
TLC: 4.69 L

## 2015-10-25 MED ORDER — ALBUTEROL SULFATE (2.5 MG/3ML) 0.083% IN NEBU
2.5000 mg | INHALATION_SOLUTION | Freq: Once | RESPIRATORY_TRACT | Status: AC
  Administered 2015-10-25: 2.5 mg via RESPIRATORY_TRACT

## 2015-11-01 ENCOUNTER — Encounter: Payer: BLUE CROSS/BLUE SHIELD | Attending: Family Medicine | Admitting: Dietician

## 2015-11-01 ENCOUNTER — Encounter: Payer: Self-pay | Admitting: Dietician

## 2015-11-01 VITALS — Ht 64.0 in | Wt 203.5 lb

## 2015-11-01 DIAGNOSIS — Z6841 Body Mass Index (BMI) 40.0 and over, adult: Secondary | ICD-10-CM | POA: Insufficient documentation

## 2015-11-01 DIAGNOSIS — Z713 Dietary counseling and surveillance: Secondary | ICD-10-CM | POA: Insufficient documentation

## 2015-11-01 DIAGNOSIS — E661 Drug-induced obesity: Secondary | ICD-10-CM

## 2015-11-01 NOTE — Progress Notes (Signed)
  Follow-up visit:  3 months Post-Operative Sleeve gastrectomy Surgery  Medical Nutrition Therapy:  Appt start time: 905 end time:  950  Primary concerns today: Post-operative Bariatric Surgery Nutrition Management.  [name] returns having lost another 13 pounds of fat since last visit. She has noticed that her weight loss has slowed and her hair is falling out.    Surgery date: 07/26/2015 Surgery type: Sleeve [facility]ast[facility] lbs on 03/26/15 Weight today: 203.5 lbs Weight change: 9 lbs Total weight lost: 47.5 lbs   TANITA  BODY COMP RESULTS  07/11/15 08/05/15 09/19/15 11/01/15   BMI (kg/m^2) 42.6 38.3 35.9 34.9   Fat Mass (lbs) 107 102.5 80.5 67.5   Fat Free Mass (lbs) 145 124 132 136   Total Body Water (lbs) 106 91 96.5 99.5    Preferred Learning Style:   No preference indicated   Learning Readiness:  Ready  24-hr recall: B (AM): 1/2 protein shake (15g) Snk (9:30-10 AM): 1/4 cup nuts (6-7g) L (1 PM): 1.5 oz grilled chicken and green beans OR [place] chicken and black beans and salsa and cheese (10g) Snk: nuts and skinny pop popcorn (6-7g) D (PM): chicken quesadilla without tortilla and refried beans OR brunswick stew OR chili (10g) Snk (PM): nuts (6-7g)  Fluid intake: 50-60 oz water, 5.5 oz protein shake (improving per patient) Estimated total protein intake: 52-60 g  Medications: resumed taking Nexium; has not started Lasix back, taking probiotic Supplementation: taking, taking gummy Calcium  Drinking while eating: sometimes with snacks Hair loss: yes, taking Biotin Carbonated beverages: none N/V/D/C: having a bowel movement daily but they are "dry" Dumping syndrome: none  Recent physical activity: water aerobics and treadmill and weight training 2-3x a week; "Fit Board" at home  Progress Towards Goal(s):  In progress.   Nutritional Diagnosis:  [place]-3.3 Overweight/obesity related to past poor dietary habits and physical inactivity as evidenced by  patient w/ recent sleeve gastrectomy surgery following dietary guidelines for continued weight loss.     Intervention:  Nutrition counseling provided.  Teaching Method Utilized:  Visual Auditory Hands on  Barriers to learning/adherence to lifestyle change: taste aversions  Demonstrated degree of understanding via:  Teach Back   Monitoring/Evaluation:  Dietary intake, exercise, and body weight. Follow up in 8 weeks for 5 month post-op visit.

## 2015-11-01 NOTE — Patient Instructions (Addendum)
Goals:  Follow Phase 3B: High Protein + Non-Starchy Vegetables  Eat 3-6 small meals/snacks, every 3-5 hrs  Increase lean protein foods to meet 60g goal  Keep working on getting 64oz + of fluid per day  Avoid drinking 15 minutes before, during and 30 minutes after eating  Keep focusing on non scale victories!  Try Citracal Petites  Try having nuts only 1x a day and replace snacks with leaner source of protein   Try Quest protein chips with cheese or chicken/egg/tuna salad  Try having the rest of your protein shake later in the day  TANITA  BODY COMP RESULTS  07/11/15 08/05/15 09/19/15 11/01/15   BMI (kg/m^2) 42.6 38.3 35.9 34.9   Fat Mass (lbs) 107 102.5 80.5 67.5   Fat Free Mass (lbs) 145 124 132 136   Total Body Water (lbs) 106 91 96.5 99.5

## 2015-11-15 ENCOUNTER — Telehealth (HOSPITAL_COMMUNITY): Payer: Self-pay

## 2015-11-15 DIAGNOSIS — I5030 Unspecified diastolic (congestive) heart failure: Secondary | ICD-10-CM

## 2015-11-15 NOTE — Telephone Encounter (Signed)
Patient would like to know her results of her Echo and PFT's.  Ok to release? Also, she would like to know if she is going to need cardiac cath? Has OV in January, recall

## 2015-11-15 NOTE — Telephone Encounter (Signed)
Echo normal no clear evidence of PAH. PFTs - spirometry normal but DLCO persistently low. Would proceed with RHC.  Please also order high-resolution lung CT (no contrast)

## 2015-11-15 NOTE — Telephone Encounter (Signed)
LVMTCB re results and if there is any scheduling restrictions for right heart cath and high resolution lung CT; as of now, both need to be ordered

## 2015-11-16 NOTE — Telephone Encounter (Signed)
LVMTCB re results and for scheduling for right heart cath and high resolution lung CT. Both still need to be ordered.  Want to know if she has any scheduling restrictions

## 2015-11-22 NOTE — Telephone Encounter (Signed)
Patient has not called back.   Still needs RHC scheduled. Order placed for High resolution lung CT

## 2015-11-22 NOTE — Telephone Encounter (Signed)
RHC scheduled for December 2nd at 7:30 AM with Dr. Gala RomneyBensimhon Patient instructed to come to main entrance, admitting at 0530am Nothing to eat or drink Will not take Lasix that AM  [facility] to schedule High Resolution lung CT without contrast already ordered in system

## 2015-11-22 NOTE — Telephone Encounter (Signed)
Left msg for pt to call me back so that I can schedule her CT.

## 2015-11-24 ENCOUNTER — Encounter (HOSPITAL_COMMUNITY): Payer: Self-pay | Admitting: Pharmacy Technician

## 2015-11-25 ENCOUNTER — Ambulatory Visit (HOSPITAL_COMMUNITY)
Admission: RE | Admit: 2015-11-25 | Discharge: 2015-11-25 | Disposition: A | Discharge status: Home / Self Care | Payer: BLUE CROSS/BLUE SHIELD | Source: Ambulatory Visit | Attending: Internal Medicine | Admitting: Internal Medicine

## 2015-11-25 ENCOUNTER — Encounter (HOSPITAL_COMMUNITY)
Admission: RE | Disposition: A | Discharge status: Home / Self Care | Payer: Self-pay | Source: Ambulatory Visit | Attending: Internal Medicine

## 2015-11-25 ENCOUNTER — Encounter (HOSPITAL_COMMUNITY): Payer: Self-pay | Admitting: Internal Medicine

## 2015-11-25 DIAGNOSIS — I2721 Secondary pulmonary arterial hypertension: Secondary | ICD-10-CM | POA: Insufficient documentation

## 2015-11-25 DIAGNOSIS — I272 Other secondary pulmonary hypertension: Secondary | ICD-10-CM | POA: Insufficient documentation

## 2015-11-25 DIAGNOSIS — K219 Gastro-esophageal reflux disease without esophagitis: Secondary | ICD-10-CM | POA: Diagnosis not present

## 2015-11-25 DIAGNOSIS — I7300 Raynaud's syndrome without gangrene: Secondary | ICD-10-CM | POA: Insufficient documentation

## 2015-11-25 DIAGNOSIS — J9610 Chronic respiratory failure, unspecified whether with hypoxia or hypercapnia: Secondary | ICD-10-CM | POA: Diagnosis not present

## 2015-11-25 DIAGNOSIS — M1990 Unspecified osteoarthritis, unspecified site: Secondary | ICD-10-CM | POA: Diagnosis not present

## 2015-11-25 DIAGNOSIS — M3481 Systemic sclerosis with lung involvement: Secondary | ICD-10-CM | POA: Insufficient documentation

## 2015-11-25 DIAGNOSIS — E669 Obesity, unspecified: Secondary | ICD-10-CM | POA: Diagnosis not present

## 2015-11-25 DIAGNOSIS — Z8249 Family history of ischemic heart disease and other diseases of the circulatory system: Secondary | ICD-10-CM | POA: Insufficient documentation

## 2015-11-25 DIAGNOSIS — K589 Irritable bowel syndrome without diarrhea: Secondary | ICD-10-CM | POA: Insufficient documentation

## 2015-11-25 DIAGNOSIS — I5032 Chronic diastolic (congestive) heart failure: Secondary | ICD-10-CM | POA: Diagnosis not present

## 2015-11-25 DIAGNOSIS — M349 Systemic sclerosis, unspecified: Secondary | ICD-10-CM | POA: Diagnosis not present

## 2015-11-25 DIAGNOSIS — J8410 Pulmonary fibrosis, unspecified: Secondary | ICD-10-CM | POA: Insufficient documentation

## 2015-11-25 DIAGNOSIS — G4733 Obstructive sleep apnea (adult) (pediatric): Secondary | ICD-10-CM | POA: Insufficient documentation

## 2015-11-25 HISTORY — PX: CARDIAC CATHETERIZATION: SHX172

## 2015-11-25 LAB — POCT I-STAT, CHEM 8
BUN: 14 mg/dL (ref 6–20)
CALCIUM ION: 1.12 mmol/L (ref 1.12–1.23)
CHLORIDE: 105 mmol/L (ref 101–111)
CREATININE: 0.8 mg/dL (ref 0.44–1.00)
GLUCOSE: 84 mg/dL (ref 65–99)
HCT: 43 % (ref 36.0–46.0)
Hemoglobin: 14.6 g/dL (ref 12.0–15.0)
POTASSIUM: 4.3 mmol/L (ref 3.5–5.1)
Sodium: 141 mmol/L (ref 135–145)
TCO2: 27 mmol/L (ref 0–100)

## 2015-11-25 LAB — POCT I-STAT 3, VENOUS BLOOD GAS (G3P V)
ACID-BASE EXCESS: 1 mmol/L (ref 0.0–2.0)
Acid-Base Excess: 1 mmol/L (ref 0.0–2.0)
BICARBONATE: 25.5 meq/L — AB (ref 20.0–24.0)
BICARBONATE: 26.4 meq/L — AB (ref 20.0–24.0)
O2 SAT: 73 %
O2 SAT: 76 %
PH VEN: 7.407 — AB (ref 7.250–7.300)
PO2 VEN: 39 mmHg (ref 30.0–45.0)
TCO2: 28 mmol/L (ref 0–100)
TCO2: 27 mmol/L (ref 0–100)
pCO2, Ven: 38.8 mmHg — ABNORMAL LOW (ref 45.0–50.0)
pCO2, Ven: 42.1 mmHg — ABNORMAL LOW (ref 45.0–50.0)
pH, Ven: 7.426 — ABNORMAL HIGH (ref 7.250–7.300)
pO2, Ven: 38 mmHg (ref 30.0–45.0)

## 2015-11-25 LAB — CBC
HCT: 41.4 % (ref 36.0–46.0)
HEMOGLOBIN: 13.7 g/dL (ref 12.0–15.0)
MCH: 30.4 pg (ref 26.0–34.0)
MCHC: 33.1 g/dL (ref 30.0–36.0)
MCV: 91.8 fL (ref 78.0–100.0)
Platelets: 204 10*3/uL (ref 150–400)
RBC: 4.51 MIL/uL (ref 3.87–5.11)
RDW: 13.6 % (ref 11.5–15.5)
WBC: 6.9 10*3/uL (ref 4.0–10.5)

## 2015-11-25 LAB — PROTIME-INR
INR: 1.15 (ref 0.00–1.49)
Prothrombin Time: 14.8 seconds (ref 11.6–15.2)

## 2015-11-25 LAB — BASIC METABOLIC PANEL
Anion gap: 10 (ref 5–15)
BUN: 9 mg/dL (ref 6–20)
CHLORIDE: 105 mmol/L (ref 101–111)
CO2: 26 mmol/L (ref 22–32)
CREATININE: 0.8 mg/dL (ref 0.44–1.00)
Calcium: 9.5 mg/dL (ref 8.9–10.3)
GFR calc Af Amer: >60 mL/min (ref >60)
GFR calc non Af Amer: >60 mL/min (ref >60)
Glucose, Bld: 73 mg/dL (ref 65–99)
POTASSIUM: 4.6 mmol/L (ref 3.5–5.1)
SODIUM: 141 mmol/L (ref 135–145)

## 2015-11-25 SURGERY — RIGHT HEART CATH
Anesthesia: LOCAL

## 2015-11-25 MED ORDER — LIDOCAINE HCL (PF) 1 % IJ SOLN
INTRAMUSCULAR | Status: AC
  Filled 2015-11-25: qty 30

## 2015-11-25 MED ORDER — ONDANSETRON HCL 4 MG/2ML IJ SOLN: 4.0000 mg | Freq: Four times a day (QID) | INTRAMUSCULAR | Status: DC | PRN

## 2015-11-25 MED ORDER — SODIUM CHLORIDE 0.9 % IV SOLN
INTRAVENOUS | Status: DC
  Administered 2015-11-25: 07:00:00 via INTRAVENOUS

## 2015-11-25 MED ORDER — SODIUM CHLORIDE 0.9 % IV SOLN: 250.0000 mL | INTRAVENOUS | Status: DC | PRN

## 2015-11-25 MED ORDER — SODIUM CHLORIDE 0.9 % IJ SOLN: 3.0000 mL | INTRAMUSCULAR | Status: DC | PRN

## 2015-11-25 MED ORDER — ACETAMINOPHEN 325 MG PO TABS: 650.0000 mg | ORAL_TABLET | ORAL | Status: DC | PRN

## 2015-11-25 MED ORDER — SODIUM CHLORIDE 0.9 % IJ SOLN: 3.0000 mL | Freq: Two times a day (BID) | INTRAMUSCULAR | Status: DC

## 2015-11-25 MED ORDER — LIDOCAINE HCL (PF) 1 % IJ SOLN
INTRAMUSCULAR | Status: DC | PRN
  Administered 2015-11-25: 08:00:00

## 2015-11-25 SURGICAL SUPPLY — 16 items
CATH BALLN WEDGE 5F 110CM (CATHETERS) ×2 IMPLANT
CATH INFINITI 5FR ANG PIGTAIL (CATHETERS) ×1 IMPLANT
CATH INFINITI 5FR MULTPACK ANG (CATHETERS) IMPLANT
CATH INFINITI JR4 5F (CATHETERS) ×1 IMPLANT
CATH SWAN GANZ 7F STRAIGHT (CATHETERS) IMPLANT
GUIDEWIRE .025 260CM (WIRE) ×1 IMPLANT
KIT HEART RIGHT NAMIC (KITS) ×2 IMPLANT
PACK CARDIAC CATHETERIZATION (CUSTOM PROCEDURE TRAY) ×2 IMPLANT
SHEATH FAST CATH BRACH 5F 5CM (SHEATH) ×2 IMPLANT
SHEATH PINNACLE 5F 10CM (SHEATH) IMPLANT
SHEATH PINNACLE 7F 10CM (SHEATH) IMPLANT
TRANSDUCER W/STOPCOCK (MISCELLANEOUS) ×3 IMPLANT
TUBING ART PRESS 72  MALE/FEM (TUBING) ×1
TUBING ART PRESS 72 MALE/FEM (TUBING) IMPLANT
WIRE EMERALD 3MM-J .035X150CM (WIRE) IMPLANT
WIRE SAFE-T 1.5MM-J .035X260CM (WIRE) ×1 IMPLANT

## 2015-11-25 NOTE — Progress Notes (Signed)
Unable to obtain 2nd IV access.  Shawna OrleansJennifer ONeal,R.N. aware

## 2015-11-25 NOTE — Interval H&P Note (Signed)
History and Physical Interval Note:  11/25/2015 7:10 AM  [name]  has presented today for surgery, with the diagnosis of pah diagnosis  The various methods of treatment have been discussed with the patient and family. After consideration of risks, benefits and other options for treatment, the patient has consented to  Procedure(s): Right Heart Cath (N/A) as a surgical intervention .  The patient's history has been reviewed, patient examined, no change in status, stable for surgery.  I have reviewed the patient's chart and labs.  Questions were answered to the patient's satisfaction.     Bensimhon, Reuel Boomaniel

## 2015-11-25 NOTE — H&P (Signed)
CARDIOLOGY H&P  Primary Cardiologist: Dr Jens Somrenshaw.  Primary HF: Dr Gala RomneyBensimhon Pulmonary: Dr Vassie LollAlva.   HPI: Ms Rachael Camacho is a 49 year old with a history of OSA, chornic diastolic HF, obesity, scleroderma, and raynauds disease referred to HF clinic by Dr Vassie LollAlva.   A year ago she was exercising without difficulty. She was able to do ballroom dancing and walking on the treadmill a mile or two without difficulty. Last September she noticed dyspnea with steps. Had another function decline in October and had increased dyspnea walking a block. Symptoms progressed in October and now SOB with minimal activity. She says her weight has been 250 pounds for the last 2 years. Has been seen by Dr Vassie LollAlva and had PFTs as below. Also referred for RHC in 3/16 with mild PH but PVR 2.8 WU.[name]. Started 20 mg po lasix in the fall of last year.   She was seen in clinic recently for pulmonary hypertension follow up. She had gastric bypass surgery on 07/26/15 and is down 47 lbs so far. She feels great and has been getting a round much better. Can walk around her neighborhood and up her work stairs with minimal SOB, where as before she was dyspneic walking back to the clinic. No fatigue with ADLs. Doing water aerobics several times a week. Still taking 20 mg lasix daily and has occasional ankle swelling with prolonged sitting. No scleroderma flares. + Raynaud's  She had recent echo which showed normal function without RV strain. However PFTs showed persistent reduction in DLCO concerning for ongoing PAH. Resents today for repeat RHC.   Studies/Tests   01/2015- 93% at rest but non walking she desaturate to 84%   Echo 11/12/14 showed grade 2 diastolic dysfunction, with normal systolic function  CT angio 11/2014 was negative for pulmonary embolism, but showed mosaic pattern suggestive of early interstitial edema >> no response to Lasix  Home sleep study 02/22/15.  Consistent with mild sleep apnea Desaturation noted.   PFTs  03/09/15 no obstruction, mild restriction TLC 78%, DLCO 52% predicted  [facility] 03/14/2015  RA 13/12 mean 10 mm Hg RV 53/11 mm Hg PA 50/19 mean 33 mm Hg  PCWP 18/16 mean 14 mm Hg LV 169/13/22 mm Hg  AO 165/92 mean 119 mm Hg Oxygen saturations: PA 73% AO 95% Cardiac Output (Fick) 6.9 L/min  Cardiac Index (Fick) 3.2 L/min/meter squared PVR = 2.8 WU No significant CAD    ROS: All systems negative except as listed in HPI, PMH and Problem List.  SH:  Social History   Social History  . Marital Status: Married    Spouse Name: N/A  . Number of Children: 2  . 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 Smoking status: Never Smoker   . Smokeless tobacco: Not on file  . Alcohol Use: 0.0 oz/week    0 Standard drinks or equivalent per week     Comment: occ  . Drug Use: No  . Sexual Activity: Not on file   Other Topics Concern  . Not on file   Social History Narrative    FH:  Family History  Problem Relation Age of Onset  . Heart failure Mother     Stent put in 2014 for blockage  . COPD Mother   . Heart disease Father   . Heart disease Son     Repair ASD    Past Medical History  Diagnosis Date  . GERD (gastroesophageal reflux disease)   . Arthritis   . Raynaud disease     hands-toes  . Wears glasses   . IBS (irritable bowel syndrome)   . Sleep apnea   . Shortness of breath dyspnea     pulmonary fibrosis-scleraderma  . Anemia     as child in first grade  . Gestational diabetes mellitus in childbirth, diet controlled 1987  . Family history of adverse reaction to anesthesia     Current Outpatient Prescriptions  Medication Sig Dispense Refill  . B-12, METHYLCOBALAMIN, SL Place 1 application under the tongue daily.    [name] CALCIUM CITRATE PO Take 500 mg  by mouth 3 (three) times daily.    . clotrimazole-betamethasone (LOTRISONE) cream Apply 1 application topically 3 (three) times daily as needed (rash).    [name] desloratadine (CLARINEX) 5 MG tablet Take 5 mg by mouth daily as needed (allergies.).   12  . docusate sodium (COLACE) 100 MG capsule Take 100 mg by mouth daily.    . fluticasone (FLONASE) 50 MCG/ACT nasal spray Place 1 spray into the nose daily as needed for allergies or rhinitis.     . furosemide (LASIX) 20 MG tablet Take 1 tablet (20 mg total) by mouth daily. 90 tablet 3  . Ivermectin (SOOLANTRA) 1 % CREA Apply 1 application topically daily as needed (rosacea.).    [name] Multiple Vitamins-Minerals (MULTIVITAMIN WITH MINERALS) tablet Take 1 tablet by mouth 2 (two) times daily.    . OXYGEN Place 2 L into the nose at bedtime.     . Probiotic Product (4X PROBIOTIC) TABS Take 1 tablet by mouth daily.     No current facility-administered medications for this encounter.    Filed Vitals:   11/25/15 0550  BP: 119/73  Pulse: 57  Temp: 97.6 F (36.4 C)  Resp: 18                      PHYSICAL EXAM:  General: Well appearing. No resp distress HEENT: normal Neck: supple. JVP flat Carotids 2+ bilaterally; no bruits. No thyromegaly or nodules. Cor: PMI normal. RRR. No rubs, gallops or murmurs. Non accentuated P2 Lungs: CTA, normal effort Abdomen: overweight, soft, NT, ND. No HSM. No bruits or masses. +BS Extremities: no cyanosis, clubbing, rash, trace ankle edema Neuro: alert & orientedx3, cranial nerves grossly intact. Moves all 4 extremities w/o difficulty. Affect pleasant   ASSESSMENT & PLAN:  1. Pulmonary HTN 2. Chronic Diastolic HF 3. OSA - CPAP per Dr Vassie Loll 4. Scleroderma 5. Chronic respiratory failure with exertional desaturations 6. Obesity s/p gastric bypass 07/26/15   Recent PFTs suggest possible worsening PAH. Will plan RHC today to further evaluate.  Mercia Dowe,  Visente Kirker,MD 7:06 AM

## 2015-11-25 NOTE — Discharge Instructions (Signed)
Angiogram, Care After °Refer to this sheet in the next few weeks. These instructions provide you with information about caring for yourself after your procedure. Your health care provider may also give you more specific instructions. Your treatment has been planned according to current medical practices, but problems sometimes occur. Call your health care provider if you have any problems or questions after your procedure. °WHAT TO EXPECT AFTER THE PROCEDURE °After your procedure, it is typical to have the following: °· Bruising at the catheter insertion site that usually fades within 1-2 weeks. °· Blood collecting in the tissue (hematoma) that may be painful to the touch. It should usually decrease in size and tenderness within 1-2 weeks. °HOME CARE INSTRUCTIONS °· Take medicines only as directed by your health care provider. °· You may shower 24-48 hours after the procedure or as directed by your health care provider. Remove the bandage (dressing) and gently wash the site with plain soap and water. Pat the area dry with a clean towel. Do not rub the site, because this may cause bleeding. °· Do not take baths, swim, or use a hot tub until your health care provider approves. °· Check your insertion site every day for redness, swelling, or drainage. °· Do not apply powder or lotion to the site. °· Do not lift over 10 lb (4.5 kg) for 5 days after your procedure or as directed by your health care provider. °· Ask your health care provider when it is okay to: °¨ Return to work or school. °¨ Resume usual physical activities or sports. °¨ Resume sexual activity. °· Do not drive home if you are discharged the same day as the procedure. Have someone else drive you. °· You may drive 24 hours after the procedure unless otherwise instructed by your health care provider. °· Do not operate machinery or power tools for 24 hours after the procedure or as directed by your health care provider. °· If your procedure was done as an  outpatient procedure, which means that you went home the same day as your procedure, a responsible adult should be with you for the first 24 hours after you arrive home. °· Keep all follow-up visits as directed by your health care provider. This is important. °SEEK MEDICAL CARE IF: °· You have a fever. °· You have chills. °· You have increased bleeding from the catheter insertion site. Hold pressure on the site. °SEEK IMMEDIATE MEDICAL CARE IF: °· You have unusual pain at the catheter insertion site. °· You have redness, warmth, or swelling at the catheter insertion site. °· You have drainage (other than a small amount of blood on the dressing) from the catheter insertion site. °· The catheter insertion site is bleeding, and the bleeding does not stop after 30 minutes of holding steady pressure on the site. °· The area near or just beyond the catheter insertion site becomes pale, cool, tingly, or numb. °  °This information is not intended to replace advice given to you by your health care provider. Make sure you discuss any questions you have with your health care provider. °  °Document Released: 06/28/2005 Document Revised: 12/31/2014 Document Reviewed: 05/13/2013 °Elsevier Interactive Patient Education ©2016 [employer] ° °

## 2015-12-12 ENCOUNTER — Ambulatory Visit (HOSPITAL_COMMUNITY)
Admission: RE | Admit: 2015-12-12 | Discharge: 2015-12-12 | Disposition: A | Discharge status: Home / Self Care | Payer: BLUE CROSS/BLUE SHIELD | Source: Ambulatory Visit | Attending: Internal Medicine | Admitting: Internal Medicine

## 2015-12-12 DIAGNOSIS — R918 Other nonspecific abnormal finding of lung field: Secondary | ICD-10-CM | POA: Diagnosis not present

## 2015-12-12 DIAGNOSIS — I2510 Atherosclerotic heart disease of native coronary artery without angina pectoris: Secondary | ICD-10-CM | POA: Diagnosis not present

## 2015-12-12 DIAGNOSIS — I5032 Chronic diastolic (congestive) heart failure: Secondary | ICD-10-CM | POA: Diagnosis not present

## 2015-12-12 DIAGNOSIS — R0602 Shortness of breath: Secondary | ICD-10-CM | POA: Insufficient documentation

## 2015-12-12 DIAGNOSIS — I5030 Unspecified diastolic (congestive) heart failure: Secondary | ICD-10-CM | POA: Diagnosis present

## 2015-12-28 ENCOUNTER — Encounter: Payer: BLUE CROSS/BLUE SHIELD | Attending: Family Medicine | Admitting: Dietician

## 2015-12-28 ENCOUNTER — Encounter: Payer: Self-pay | Admitting: Dietician

## 2015-12-28 DIAGNOSIS — Z6841 Body Mass Index (BMI) 40.0 and over, adult: Secondary | ICD-10-CM | POA: Insufficient documentation

## 2015-12-28 DIAGNOSIS — Z713 Dietary counseling and surveillance: Secondary | ICD-10-CM | POA: Diagnosis not present

## 2015-12-28 NOTE — Progress Notes (Signed)
  Follow-up visit:  5 months Post-Operative Sleeve gastrectomy Surgery  Medical Nutrition Therapy:  Appt start time: 810 end time:  850  Primary concerns today: Post-operative Bariatric Surgery Nutrition Management.  [name] returns having lost another 11 pounds since last visit. She reports she has not been working out during the holidays. Plans to resume workout routine. Signed up for a 5k on March 25th.    Surgery date: 07/26/2015 Surgery type: Sleeve Gastrectomy [facility]: [facility] lbs on 03/26/15 Weight today: 192.5 lbs Weight change: 11 lbs Total weight lost: 58.5 lbs   Goal weight: 150 lbs   TANITA  BODY COMP RESULTS  07/11/15 08/05/15 09/19/15 11/01/15 12/28/15   BMI (kg/m^2) 42.6 38.3 35.9 34.9 32.5   Fat Mass (lbs) 107 102.5 80.5 67.5 61   Fat Free Mass (lbs) 145 124 132 136 131.5   Total Body Water (lbs) 106 91 96.5 99.5 96.5    Preferred Learning Style:   No preference indicated   Learning Readiness:  Ready  24-hr recall: B (AM): 1/2 protein shake (15g) Snk (10 AM): 1/4 cup nuts (6-7g) L (1 PM): chili or [place] stew (14g) Snk: nuts (6-7g) D (PM): brun[place] stew or chili or lean meat with beans and vegetable (14g) Snk (PM): cheese and crackers or nuts (6-7g)  Fluid intake: 50-60 oz water, 5.5 oz protein shake (improving per patient) Estimated total protein intake: 64 g  Medications: resumed taking Nexium; has not started Lasix back, taking probiotic Supplementation: taking, taking gummy Calcium  Drinking while eating: sometimes with snacks Hair loss: yes, taking Biotin Carbonated beverages: none N/V/D/C: some constipation (takes magnesium, probiotic, and stool softener); sometimes needs a laxative and smooth move tea (enema about every 6 weeks) Dumping syndrome: none  Recent physical activity: water aerobics and treadmill and weight training 2-3x a week; "Fit Board" at home  Progress Towards Goal(s):  In progress.   Nutritional Diagnosis:  [place]-3.3  Overweight/obesity related to past poor dietary habits and physical inactivity as evidenced by patient w/ recent sleeve gastrectomy surgery following dietary guidelines for continued weight loss.     Intervention:  Nutrition counseling provided.  Teaching Method Utilized:  Visual Auditory Hands on  Barriers to learning/adherence to lifestyle change: taste aversions  Demonstrated degree of understanding via:  Teach Back   Monitoring/Evaluation:  Dietary intake, exercise, and body weight. Follow up in 8 weeks for 7 month post-op visit.

## 2015-12-28 NOTE — Patient Instructions (Addendum)
Goals:  Follow Phase 3B: High Protein + Non-Starchy Vegetables  Eat 3-6 small meals/snacks, every 3-5 hrs  Increase lean protein foods to meet 60g goal  Keep working on getting 64oz + of fluid per day  Avoid drinking 15 minutes before, during and 30 minutes after eating  Keep focusing on non scale victories!  Try Citracal Petites  Try having nuts only 1x a day and replace snacks with leaner source of protein   Work on having more structured snacks   Get back into exercise routine  Try Baritastic app   TANITA  BODY COMP RESULTS  07/11/15 08/05/15 09/19/15 11/01/15 12/28/15   BMI (kg/m^2) 42.6 38.3 35.9 34.9 32.5   Fat Mass (lbs) 107 102.5 80.5 67.5 61   Fat Free Mass (lbs) 145 124 132 136 131.5   Total Body Water (lbs) 106 91 96.5 99.5 96.5

## 2016-01-08 ENCOUNTER — Other Ambulatory Visit: Payer: Self-pay | Admitting: Cardiology

## 2016-01-10 ENCOUNTER — Ambulatory Visit (INDEPENDENT_AMBULATORY_CARE_PROVIDER_SITE_OTHER): Payer: BLUE CROSS/BLUE SHIELD | Admitting: Pulmonary Disease

## 2016-01-10 ENCOUNTER — Encounter: Payer: Self-pay | Admitting: Pulmonary Disease

## 2016-01-10 VITALS — BP 128/70 | HR 64 | Ht 64.0 in | Wt 192.0 lb

## 2016-01-10 DIAGNOSIS — G4733 Obstructive sleep apnea (adult) (pediatric): Secondary | ICD-10-CM

## 2016-01-10 DIAGNOSIS — I272 Other secondary pulmonary hypertension: Secondary | ICD-10-CM

## 2016-01-10 NOTE — Patient Instructions (Signed)
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

## 2016-01-10 NOTE — Progress Notes (Signed)
   Subjective:    Patient ID: Rachael Camacho, female    DOB: 1966-06-26, 50 y.o.   MRN: [id number]  HPI  50 year old never smoker with scleroderma for FU of DOE & OSA. She had oxygen desaturation on exertion, RHC showed PH - s/o increased pulmonary flow, low PVR  Seen in the past for raynauds', arthritis  01/10/2016  Chief Complaint  Patient presents with  . Follow-up    pt. states she no longer wears her CPAP only wears her 02 @ 2L. SOB has improved. no new concerns today.   CPAP was dc'd 08/2015 after HST -she is compliant with nocturnal oxygen She has been evaluated by rheumatology-scleroderma being considered- right heart cath advised She lost wt with bariatric surgery  We reviewed PFTs and  Repeat right heart cath results  Significant tests/ events  Echo 11/12/14 showed grade 2 diastolic dysfunction, with normal systolic function CT angio 11/2014 was negative for pulmonary embolism, but showed mosaic pattern suggestive of early interstitial edema >> no response to Lasix  01/2015 saturation about 93%-on walking she desaturate to 84%  Home sleep study 02/2015 >> AHI 10.6, SaO2 low 70%. She spent 329.7 min (84.7% of test time) with SaO2 < 90%. Consistent with mild sleep apnea   PFTs 02/2015 no obstruction, mild restriction TLC 78%, DLCO 52%  Right heart cath >> 02/2015 -right atrial pressure 10, pulmonary capillary wedge pressure 14 and PA pressure 50/19. PVR calculates to 2.7 WU (normal range). PA pressures are up due to high output and pulmonary vasodilators not indicated.  Rheum consult >> Nickola Major) ? Scleroderma  HST 08/2015 >> low AHI, desatn > stay on 2L  O2  RHC 11/2015 PVR 2.8 Wu PFT 11/2015 DLCO 58%   Review of Systems neg for any significant sore throat, dysphagia, itching, sneezing, nasal congestion or excess/ purulent secretions, fever, chills, sweats, unintended wt loss, pleuritic or exertional cp, hempoptysis, orthopnea pnd or change in chronic leg swelling.  Also denies presyncope, palpitations, heartburn, abdominal pain, nausea, vomiting, diarrhea or change in bowel or urinary habits, dysuria,hematuria, rash, arthralgias, visual complaints, headache, numbness weakness or ataxia.     Objective:   Physical Exam  pleasant HEENT - no thrush, no post nasal drip no lymphadenopathy CVS- s1s2 nml, no murmur, no JVD RS- clear, no crackles or rhonchi Abd- soft, non-tender, no organomegaly CNS- non focal Ext - no clubbing, cyanosis or edema       Assessment & Plan:

## 2016-01-10 NOTE — Assessment & Plan Note (Signed)
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

## 2016-01-11 NOTE — Assessment & Plan Note (Signed)
Rresolved, nnow off CPAP

## 2016-03-01 ENCOUNTER — Encounter: Payer: Self-pay | Admitting: Dietician

## 2016-03-01 ENCOUNTER — Encounter: Payer: BLUE CROSS/BLUE SHIELD | Attending: Family Medicine | Admitting: Dietician

## 2016-03-01 DIAGNOSIS — Z713 Dietary counseling and surveillance: Secondary | ICD-10-CM | POA: Diagnosis not present

## 2016-03-01 DIAGNOSIS — Z6841 Body Mass Index (BMI) 40.0 and over, adult: Secondary | ICD-10-CM | POA: Insufficient documentation

## 2016-03-01 NOTE — Progress Notes (Signed)
  Follow-up visit:  7 months Post-Operative Sleeve gastrectomy Surgery  Medical Nutrition Therapy:  Appt start time: 850 end time:  930  Primary concerns today: Post-operative Bariatric Surgery Nutrition Management.  Rachael Camacho returns having lost another 2 pounds since last visit. Per [place] body composition scale she has lost 4.5 lbs fat and has increased lean mass and water. She reports she has increased exercise (zumba and weight training). Recently lost her job and has been at home. Still drinking only water.    Surgery date: 07/26/2015 Surgery type: Sleeve Gastrectomy [facility]: 251 lbs on 03/26/15 Weight today: 190.5 lbs Weight change: 2 lbs Total weight lost: 60.5 lbs   Goal weight: 150 lbs   TANITA  BODY COMP RESULTS  07/11/15 08/05/15 09/19/15 11/01/15 12/28/15 03/01/16   BMI (kg/m^2) 42.6 38.3 35.9 34.9 32.5 32.2   Fat Mass (lbs) 107 102.5 80.5 67.5 61 56.5   Fat Free Mass (lbs) 145 124 132 136 131.5 134   Total Body Water (lbs) 106 91 96.5 99.5 96.5 98    Preferred Learning Style:   No preference indicated   Learning Readiness:  Ready  24-hr recall: B (AM): 1/2 protein shake (15g) Snk (10 AM): nuts or toast with peanut butter L (1 PM): chicken/egg salad with crackers OR leftover chicken and veggies (14-21g) Snk:  D (PM): stir fry chicken and vegetables (14-21g) Snk (PM): chocolate  Fluid intake: 50-60 oz water, 5.5 oz protein shake  Estimated total protein intake: 60+ g  Medications: resumed taking Nexium; has not started Lasix back, taking probiotic Supplementation: taking, taking gummy Calcium  Drinking while eating: no Hair loss: hair loss has stopped Carbonated beverages: none N/V/D/C: regular bowel movements (takes magnesium as needed and stool softener every night) Dumping syndrome: none  Recent physical activity: water aerobics and treadmill and weight training 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   Nutritional Diagnosis:  [place]-3.3 Overweight/obesity related to past poor dietary habits and physical inactivity as evidenced by patient w/ recent sleeve gastrectomy surgery following dietary guidelines for continued weight loss.     Intervention:  Nutrition counseling provided.  Teaching Method Utilized:  Visual Auditory Hands on  Barriers to learning/adherence to lifestyle change: taste aversions  Demonstrated degree of understanding via:  Teach Back   Monitoring/Evaluation:  Dietary intake, exercise, and body weight. Follow up in 8 weeks for 9 month post-op visit.

## 2016-03-01 NOTE — Patient Instructions (Addendum)
Goals:  Follow Phase 3B: High Protein + Non-Starchy Vegetables  Eat 3-6 small meals/snacks, every 3-5 hrs  Increase lean protein foods to meet 60g goal  Keep working on getting 64oz + of fluid per day  Avoid drinking 15 minutes before, during and 30 minutes after eating  Keep focusing on non scale victories!  Try Citracal Petites  Try having nuts only 1x a day and replace snacks with leaner source of protein   Work on having more structured snacks   Get back into exercise routine  Try Baritastic app  TANITA  BODY COMP RESULTS  07/11/15 08/05/15 09/19/15 11/01/15 12/28/15 03/01/16   BMI (kg/m^2) 42.6 38.3 35.9 34.9 32.5 32.2   Fat Mass (lbs) 107 102.5 80.5 67.5 61 56.5   Fat Free Mass (lbs) 145 124 132 136 131.5 134   Total Body Water (lbs) 106 91 96.5 99.5 96.5 98

## 2016-05-02 ENCOUNTER — Encounter: Payer: BLUE CROSS/BLUE SHIELD | Attending: Family Medicine | Admitting: Dietician

## 2016-05-02 ENCOUNTER — Encounter: Payer: Self-pay | Admitting: Dietician

## 2016-05-02 DIAGNOSIS — Z713 Dietary counseling and surveillance: Secondary | ICD-10-CM | POA: Diagnosis not present

## 2016-05-02 DIAGNOSIS — Z6841 Body Mass Index (BMI) 40.0 and over, adult: Secondary | ICD-10-CM | POA: Diagnosis not present

## 2016-05-02 NOTE — Patient Instructions (Addendum)
Goals:  Follow Phase 3B: High Protein + Non-Starchy Vegetables  Eat 3-6 small meals/snacks, every 3-5 hrs  Increase lean protein foods to meet 60g goal  Keep working on getting 64oz + of fluid per day  Avoid drinking 15 minutes before, during and 30 minutes after eating  Keep focusing on non scale victories!  Try Citracal Petites  Try having nuts only 1x a day and replace snacks with leaner source of protein   Work on having more structured snacks   Get back into exercise routine  Couch to 5k this summer  Keep signing up for 5ks to keep you motivated  Try [employer] app  Focus on non scale victories!  Keep surrounding yourself with supportive people ([facility] support group)  Make sure to keep the healthy habits you have developed  Watching portions  Protein first, then vegetables

## 2016-05-02 NOTE — Progress Notes (Signed)
  Follow-up visit:  9 months Post-Operative Sleeve gastrectomy Surgery  Medical Nutrition Therapy:  Appt start time: 815 end time:  845  Primary concerns today: Post-operative Bariatric Surgery Nutrition Management.  Rachael Camacho returns having lost another 1 pound since last visit. He weight loss has plateaued but patient states that she has "strayed from her diet." Her husband and son keep trigger foods in her house and she finds these foods difficult to resist. 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 Patient reports she likes her job and enjoys having more of a routine. This summer she plans to participate in couch to [facility] program with her son and has already signed up to walk/run a few races.    Surgery date: 07/26/2015 Surgery type: Sleeve Gastrectomy [facility] lbs on 03/26/15 Weight today: 189.3 lbs Weight change: 1.2 lbs Total weight lost: 61 lbs   Goal weight: 150 lbs   TANITA  BODY COMP RESULTS  07/11/15 08/05/15 09/19/15 11/01/15 12/28/15 03/01/16 05/02/16   BMI (kg/m^2) 42.6 38.3 35.9 34.9 32.5 32.2 N/A   Fat Mass (lbs) 107 102.5 80.5 67.5 61 56.5    Fat Free Mass (lbs) 145 124 132 136 131.5 134    Total Body Water (lbs) 106 91 96.5 99.5 96.5 98     Preferred Learning Style:   No preference indicated   Learning Readiness:  Ready  24-hr recall: B (AM): 1/2 protein shake (15g) Snk (10 AM): nuts or toast with peanut butter L (1 PM): chicken lasagna with salad OR tacos OR baked chicken  Snk:  D (PM): stir fry chicken and vegetables (14-21g) Snk (PM): chocolate  Fluid intake: 50-60 oz water, 5.5 oz protein shake  Estimated total protein intake: 60+ g  Medications: resumed taking Nexium; has not started Lasix back, taking probiotic Supplementation: taking, taking gummy Calcium  Drinking while eating: no Hair loss: hair loss has stopped Carbonated beverages: none N/V/D/C: regular bowel movements (takes magnesium as needed and stool softener every  night) Dumping syndrome: none  Recent physical activity: water aerobics and treadmill and weight training 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   Nutritional Diagnosis:  [place]-3.3 Overweight/obesity related to past poor dietary habits and physical inactivity as evidenced by patient w/ recent sleeve gastrectomy surgery following dietary guidelines for continued weight loss.     Intervention:  Nutrition counseling provided.  Teaching Method Utilized:  Visual Auditory Hands on  Barriers to learning/adherence to lifestyle change: taste aversions  Demonstrated degree of understanding via:  Teach Back   Monitoring/Evaluation:  Dietary intake, exercise, and body weight. Follow up in 2.5 months for 11 month post-op visit.

## 2016-07-18 ENCOUNTER — Encounter: Payer: BLUE CROSS/BLUE SHIELD | Attending: Family Medicine | Admitting: Dietician

## 2016-07-18 DIAGNOSIS — Z6841 Body Mass Index (BMI) 40.0 and over, adult: Secondary | ICD-10-CM | POA: Diagnosis not present

## 2016-07-18 DIAGNOSIS — Z713 Dietary counseling and surveillance: Secondary | ICD-10-CM | POA: Insufficient documentation

## 2016-07-18 DIAGNOSIS — E661 Drug-induced obesity: Secondary | ICD-10-CM

## 2016-07-18 NOTE — Patient Instructions (Addendum)
Goals:  Follow Phase 3B: High Protein + Non-Starchy Vegetables  Eat 3-6 small meals/snacks, every 3-5 hrs  Increase lean protein foods to meet 60g goal  Keep working on getting 64oz + of fluid per day  Avoid drinking 15 minutes before, during and 30 minutes after eating  Keep focusing on non scale victories!  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  Keep surrounding yourself with supportive people ([facility] support group)  Make sure to keep the healthy habits you have developed  Watching portions  Protein first, then vegetables

## 2016-07-18 NOTE — Progress Notes (Signed)
  Follow-up visit:  1 year Post-Operative Sleeve gastrectomy Surgery  Medical Nutrition Therapy:  Appt start time: 1000 end time: 1045  Primary concerns today: Post-operative Bariatric Surgery Nutrition Management.  Rachael Camacho returns having maintained her fat mass. She states she would still like to lose more weight. Concerned that she has a bigger appetite now, feeling hungry often.   Surgery date: 07/26/2015 Surgery type: Sleeve Gastrectomy Start weight at [facility]: 251 lbs on 03/26/15 Weight today: 193 lbs Weight change: 3.7 lbs gain Total weight lost: 58 lbs   Goal weight: 150 lbs   TANITA  BODY COMP RESULTS  07/11/15 08/05/15 09/19/15 11/01/15 12/28/15 03/01/16 05/02/16 07/18/16   BMI (kg/m^2) 42.6 38.3 35.9 34.9 32.5 32.2 N/A 32.6   Fat Mass (lbs) 107 102.5 80.5 67.5 61 56.5  56.2   Fat Free Mass (lbs) 145 124 132 136 131.5 134  136.8   Total Body Water (lbs) 106 91 96.5 99.5 96.5 98  97.6    Preferred Learning Style:   No preference indicated   Learning Readiness:  Ready  24-hr recall: B (AM): 1/2 protein shake, vitamins (15g) Snk (9-9:30 AM): [employer] protein bar Snk: sometimes [place] crackers L (11:45 AM): catered lunch: sandwiches or quiche with fruit or tacos or meatloaf with green beans Snk (2:30-3 PM): cashews Snk: sometimes more Belvita  D (PM): stir fry chicken and vegetables (14-21g) Snk (PM): chocolate  Fluid intake: 50-60 oz water, 5.5 oz protein shake  Estimated total protein intake: 60+ g  Medications: resumed taking Nexium; has not started Lasix back, taking probiotic Supplementation: taking, taking gummy Calcium  Drinking while eating: no Hair loss: hair loss has stopped Carbonated beverages: none N/V/D/C: regular bowel movements (takes magnesium as needed and stool softener every night) Dumping syndrome: none  Recent physical activity: water aerobics and treadmill and weight training 2-3x a week; Restaurant manager, 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   Nutritional Diagnosis:  [place]-3.3 Overweight/obesity related to past poor dietary habits and physical inactivity as evidenced by patient w/ recent sleeve gastrectomy surgery following dietary guidelines for continued weight loss.     Intervention:  Nutrition counseling provided.  Teaching Method Utilized:  Visual Auditory Hands on  Barriers to learning/adherence to lifestyle change: taste aversions  Demonstrated degree of understanding via:  Teach Back   Monitoring/Evaluation:  Dietary intake, exercise, and body weight. Follow up in 3 months for 15 month post-op visit.

## 2016-10-18 ENCOUNTER — Encounter: Payer: BLUE CROSS/BLUE SHIELD | Attending: Family Medicine | Admitting: Dietician

## 2016-10-18 ENCOUNTER — Encounter: Payer: Self-pay | Admitting: Dietician

## 2016-10-18 DIAGNOSIS — Z713 Dietary counseling and surveillance: Secondary | ICD-10-CM | POA: Insufficient documentation

## 2016-10-18 NOTE — Patient Instructions (Addendum)
   Work on increasing activity  Keep doing activities you enjoy   Surgery date: 07/26/2015 Surgery type: Sleeve Gastrectomy [facility] lbs on 03/26/15 Weight today: 196 lbs Weight change: 3 lbs gain (maintained fat mass) Total weight lost: 55 lbs    TANITA  BODY COMP RESULTS  07/11/15 08/05/15 09/19/15 11/01/15 12/28/15 03/01/16 05/02/16 07/18/16 10/18/16   BMI (kg/m^2) 42.6 38.3 35.9 34.9 32.5 32.2 N/A 32.6 33.1   Fat Mass (lbs) 107 102.5 80.5 67.5 61 56.5  56.2 56.6   Fat Free Mass (lbs) 145 124 132 136 131.5 134  136.8 139.4   Total Body Water (lbs) 106 91 96.5 99.5 96.5 98  97.6 99.4

## 2016-10-18 NOTE — Progress Notes (Signed)
  Follow-up visit:  1 year Post-Operative Sleeve gastrectomy Surgery  Medical Nutrition Therapy:  Appt start time: 925 end time: 1015  Primary concerns today: Post-operative Bariatric Surgery Nutrition Management.  Rachael Camacho returns having maintained her fat mass. Feeling good about maintaining her weight but would still like to lose more weight. Has been scheduling 5ks and walks/races to try to stay active. Knows she needs to increase exercise. Has been active overall. Her husband is thinking about changing from 3rd to 2nd shift and Rachael Camacho feels like she will have more freedom to go to the gym after work.   Surgery date: 07/26/2015 Surgery type: Sleeve Gastrectomy [facility] lbs on 03/26/15 Weight today: 196 lbs Weight change: 3 lbs gain (maintained fat mass) Total weight lost: 55 lbs   Goal weight: 150 lbs   TANITA  BODY COMP RESULTS  07/11/15 08/05/15 09/19/15 11/01/15 12/28/15 03/01/16 05/02/16 07/18/16 10/18/16   BMI (kg/m^2) 42.6 38.3 35.9 34.9 32.5 32.2 N/A 32.6 33.1   Fat Mass (lbs) 107 102.5 80.5 67.5 61 56.5  56.2 56.6   Fat Free Mass (lbs) 145 124 132 136 131.5 134  136.8 139.4   Total Body Water (lbs) 106 91 96.5 99.5 96.5 98  97.6 99.4    Preferred Learning Style:   No preference indicated   Learning Readiness:  Ready  24-hr recall: B (AM): 1/2 protein shake, vitamins (15g) Snk (9-9:30 AM): [employer] protein bar Snk: sometimes Belvita crackers L (11:45 AM): catered lunch: sandwiches or quiche with fruit or tacos or meatloaf with green beans Snk (2:30-3 PM): cashews Snk: sometimes more Belvita  D (PM): stir fry chicken and vegetables (14-21g) Snk (PM): chocolate  Fluid intake: 50-60 oz water, 5.5 oz protein shake  Estimated total protein intake: 60+ g  Medications: resumed taking Nexium; has not started Lasix back, taking probiotic Supplementation: taking, taking gummy Calcium  Drinking while eating: no Hair loss: hair loss has stopped Carbonated  beverages: none N/V/D/C: diarrhea Dumping syndrome: none  Recent physical activity: water aerobics and treadmill and weight training 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   Nutritional Diagnosis:  [place]-3.3 Overweight/obesity related to past poor dietary habits and physical inactivity as evidenced by patient w/ recent sleeve gastrectomy surgery following dietary guidelines for continued weight loss.     Intervention:  Nutrition counseling provided.  Teaching Method Utilized:  Visual Auditory Hands on  Barriers to learning/adherence to lifestyle change: taste aversions  Demonstrated degree of understanding via:  Teach Back   Monitoring/Evaluation:  Dietary intake, exercise, and body weight. Follow up in 4 months for 1.5 year post-op visit.

## 2016-11-03 IMAGING — CR DG UGI W/ GASTROGRAFIN
1 series · 1 of 1 positions shown · IV contrast (omnipaque)
Comparison: No priors.

CLINICAL DATA: 49-year-old female status post sleeve gastrectomy
yesterday.

EXAM:
WATER SOLUBLE UPPER GI SERIES
TECHNIQUE: Single-column upper GI series was performed using water soluble
contrast.
CONTRAST:  50mL OMNIPAQUE IOHEXOL 300 MG/ML  SOLN

[view not recorded]
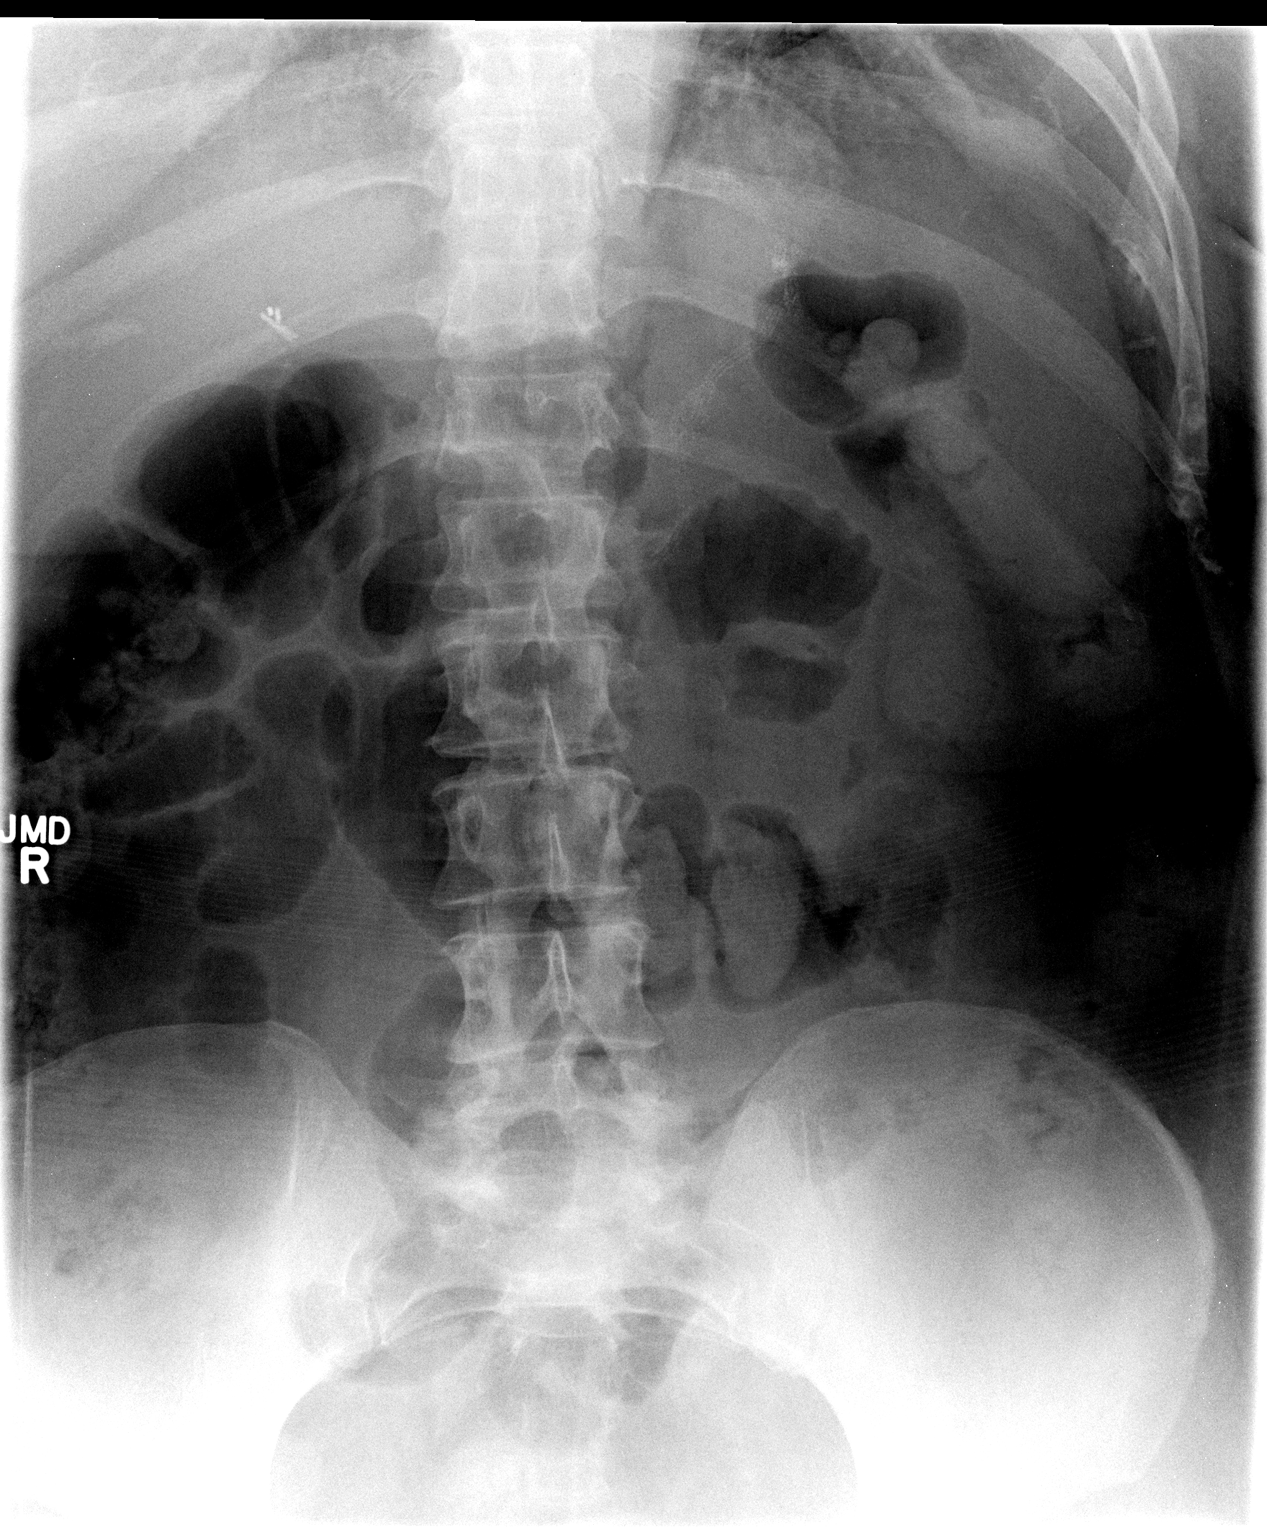

[1 of 1 positions shown; findings below may reference images not displayed]

FLUOROSCOPY TIME:  If the device does not provide the exposure
index:

Fluoroscopy Time (in minutes and seconds):  1 minutes and 20 seconds

Number of Acquired Images:  24
FINDINGS: Initial KUB demonstrated some surgical sutures in the epigastric
region. Subsequently, following ingestion of water soluble oral
contrast the gastric lumen was opacified. The gastric lumen appeared
narrowed, compatible with recent sleeve gastrectomy. Contrast
readily traversed the pylorus into the proximal small bowel. No
extravasation of contrast was observed during the examination.
IMPRESSION: 1. Expected postoperative appearance following sleeve gastrectomy,
as above, without evidence of extravasation or obstruction.

## 2017-01-10 ENCOUNTER — Ambulatory Visit (HOSPITAL_COMMUNITY): Admission: RE | Admit: 2017-01-10 | Payer: BLUE CROSS/BLUE SHIELD | Source: Ambulatory Visit

## 2017-01-10 ENCOUNTER — Encounter (HOSPITAL_COMMUNITY): Payer: BLUE CROSS/BLUE SHIELD | Admitting: Internal Medicine

## 2017-01-11 ENCOUNTER — Other Ambulatory Visit: Payer: Self-pay | Admitting: Gastroenterology

## 2017-01-11 DIAGNOSIS — R1311 Dysphagia, oral phase: Secondary | ICD-10-CM

## 2017-01-15 ENCOUNTER — Ambulatory Visit
Admission: RE | Admit: 2017-01-15 | Discharge: 2017-01-15 | Disposition: A | Discharge status: Home / Self Care | Payer: BLUE CROSS/BLUE SHIELD | Source: Ambulatory Visit | Attending: Gastroenterology | Admitting: Gastroenterology

## 2017-01-15 DIAGNOSIS — R1311 Dysphagia, oral phase: Secondary | ICD-10-CM

## 2017-01-28 ENCOUNTER — Other Ambulatory Visit (HOSPITAL_COMMUNITY): Payer: Self-pay | Admitting: *Deleted

## 2017-01-28 DIAGNOSIS — I272 Pulmonary hypertension, unspecified: Secondary | ICD-10-CM

## 2017-01-29 ENCOUNTER — Ambulatory Visit (HOSPITAL_COMMUNITY)
Admission: RE | Admit: 2017-01-29 | Discharge: 2017-01-29 | Disposition: A | Discharge status: Home / Self Care | Payer: BLUE CROSS/BLUE SHIELD | Source: Ambulatory Visit | Attending: Internal Medicine | Admitting: Internal Medicine

## 2017-01-29 ENCOUNTER — Ambulatory Visit (HOSPITAL_BASED_OUTPATIENT_CLINIC_OR_DEPARTMENT_OTHER)
Admission: RE | Admit: 2017-01-29 | Discharge: 2017-01-29 | Disposition: A | Discharge status: Home / Self Care | Payer: BLUE CROSS/BLUE SHIELD | Source: Ambulatory Visit | Attending: Internal Medicine | Admitting: Internal Medicine

## 2017-01-29 ENCOUNTER — Encounter (HOSPITAL_COMMUNITY): Payer: Self-pay | Admitting: Internal Medicine

## 2017-01-29 VITALS — BP 118/74 | HR 67 | Wt 203.2 lb

## 2017-01-29 DIAGNOSIS — Z9884 Bariatric surgery status: Secondary | ICD-10-CM | POA: Insufficient documentation

## 2017-01-29 DIAGNOSIS — M349 Systemic sclerosis, unspecified: Secondary | ICD-10-CM | POA: Diagnosis not present

## 2017-01-29 DIAGNOSIS — I272 Pulmonary hypertension, unspecified: Secondary | ICD-10-CM

## 2017-01-29 DIAGNOSIS — Z79899 Other long term (current) drug therapy: Secondary | ICD-10-CM | POA: Insufficient documentation

## 2017-01-29 DIAGNOSIS — G4733 Obstructive sleep apnea (adult) (pediatric): Secondary | ICD-10-CM | POA: Diagnosis not present

## 2017-01-29 DIAGNOSIS — I517 Cardiomegaly: Secondary | ICD-10-CM | POA: Diagnosis not present

## 2017-01-29 DIAGNOSIS — I5032 Chronic diastolic (congestive) heart failure: Secondary | ICD-10-CM | POA: Diagnosis not present

## 2017-01-29 DIAGNOSIS — Z6833 Body mass index (BMI) 33.0-33.9, adult: Secondary | ICD-10-CM | POA: Diagnosis not present

## 2017-01-29 DIAGNOSIS — I7300 Raynaud's syndrome without gangrene: Secondary | ICD-10-CM | POA: Diagnosis not present

## 2017-01-29 DIAGNOSIS — J9610 Chronic respiratory failure, unspecified whether with hypoxia or hypercapnia: Secondary | ICD-10-CM | POA: Insufficient documentation

## 2017-01-29 DIAGNOSIS — E669 Obesity, unspecified: Secondary | ICD-10-CM | POA: Insufficient documentation

## 2017-01-29 NOTE — Progress Notes (Signed)
  Echocardiogram 2D Echocardiogram has been performed.  Rachael SavoyCasey Camacho Rachael Camacho 01/29/2017, 10:03 AM

## 2017-01-29 NOTE — Addendum Note (Signed)
Encounter addended by: Suezanne CheshirePamela S Lewis, RN on: 01/29/2017 10:34 AM<BR>    Actions taken: Sign clinical note

## 2017-01-29 NOTE — Progress Notes (Signed)
Advanced Heart Failure Clinic Note   Patient ID: Rachael Camacho, female   DOB: March 04, 1966, 51 y.o.   MRN: [id number] PCP: Primary Cardiologist: Dr Jens Somrenshaw.  Primary HF: Dr Gala RomneyBensimhon Pulmonary: Dr Vassie LollAlva.   HPI: Rachael Camacho is a 51 year old with a history of  OSA, chornic diastolic HF, obesity, scleroderma, and raynauds disease referred to HF clinic by Dr Vassie LollAlva.   A year ago she was exercising without difficulty. She was able to do ballroom dancing and walking on the treadmill a mile or two without difficulty. Last September she noticed dyspnea with steps. Had another function decline in October and had increased dyspnea walking a block.  Symptoms progressed in October and now SOB with minimal activity. She says her weight has been 250 pounds for the last 2 years.  Has been seen by Dr Vassie LollAlva and had PFTs as below. Also referred for RHC in 3/16 with mild PH but PVR 2.8 WU.[name]. Started 20 mg po lasix in the fall of last year.   She reports today for Pulmonary hypertension follow up.  She had gastric bypass surgery on 07/26/15 and is down 65 lbs so far. She feels very good. Not as active over the winter. Getting ready for 5K in April. Pending esophageal stricture dilation.  No fatigue with ADL. Doesn't take lasix as much due to work. Takes it over the weekends. Still with edema in legs. Wears compression socks. No scleroderma flares. + Raynaud's  Studies/Tests   01/2015- 93% at rest but non walking she desaturate to 84%   Echo 11/12/14 showed grade 2 diastolic dysfunction, with normal systolic function Echo  01/29/17  EF 60% normal RV no PAH  CT angio 11/2014 was negative for pulmonary embolism, but showed mosaic pattern suggestive of early interstitial edema >> no response to Lasix CT angio 12/16: No ILD  VQ 7/16: No PE  Home sleep study 02/22/15.  Consistent with mild sleep apnea Desaturation noted.   PFTs 03/09/15 no obstruction, mild restriction TLC 78%, DLCO 52% predicted PFTs  11/16 FEV1 2.75L FVC    3.17L DLCO 52%  RHC 12/16 RA = 7 RV = 42/2/8 PA = 44/16 (28) PCW = 9 Fick cardiac output/index = 6.9/3.6 PVR = 2.8 WU Ao sat = 95% PA sat = 73% 76%   RHC [facility] 03/14/2015  RA 13/12 mean 10 mm Hg RV 53/11 mm Hg PA 50/19 mean 33 mm Hg  PCWP 18/16 mean 14 mm Hg LV 169/13/22 mm Hg  AO 165/92 mean 119 mm Hg Oxygen saturations: PA 73% AO 95% Cardiac Output (Fick) 6.9 L/min  Cardiac Index (Fick) 3.2 L/min/meter squared PVR = 2.8 WU No significant CAD     ROS: All systems negative except as listed in HPI, PMH and Problem List.  SH:  Social History   Social History  . Marital status: Married    Spouse name: N/A  . Number of children: 2  . Years of education: N/A   Occupational History  .      Office Work   Social History Main Topics  . Smoking status: Never Smoker  . Smokeless tobacco: Never Used  . Alcohol use 0.0 oz/week     Comment: occ  . Drug use: No  . Sexual activity: Not on file   Other Topics Concern  . Not on file   Social History Narrative  . No narrative on file    FH:  Family History  Problem Relation Age of Onset  . Heart failure  Mother     Stent put in 2014 for blockage  . COPD Mother   . Heart disease Father   . Heart disease Son     Repair ASD    Past Medical History:  Diagnosis Date  . Anemia    as child in first grade  . Arthritis   . Family history of adverse reaction to anesthesia   . GERD (gastroesophageal reflux disease)   . Gestational diabetes mellitus in childbirth, diet controlled 1987  . IBS (irritable bowel syndrome)   . Raynaud disease    hands-toes  . Shortness of breath dyspnea    pulmonary fibrosis-scleraderma  . Sleep apnea   . Wears glasses     Current Outpatient Prescriptions  Medication Sig Dispense Refill  . B-12, METHYLCOBALAMIN, SL Place 1 application under the tongue daily.    [name] BIOTIN PO Take 1 tablet by mouth daily.    [name] CALCIUM CITRATE PO Take 500 mg by mouth 2 (two) times daily.      . fluticasone (FLONASE) 50 MCG/ACT nasal spray Place 1 spray into the nose daily as needed for allergies or rhinitis.     . furosemide (LASIX) 20 MG tablet TAKE 1 TABLET (20 MG TOTAL) BY MOUTH DAILY. 90 tablet 0  . Multiple Vitamins-Minerals (MULTIVITAMIN WITH MINERALS) tablet Take 1 tablet by mouth daily.     . Probiotic Product (4X PROBIOTIC) TABS Take 1 tablet by mouth daily.    . clotrimazole-betamethasone (LOTRISONE) cream Apply 1 application topically 3 (three) times daily as needed (rash).    [name] desloratadine (CLARINEX) 5 MG tablet Take 5 mg by mouth daily as needed (allergies.).   12  . Ivermectin (SOOLANTRA) 1 % CREA Apply 1 application topically daily as needed (rosacea.).    [name] OXYGEN Place 2 L into the nose at bedtime.      No current facility-administered medications for this encounter.     Vitals:   01/29/17 1006  BP: 118/74  Pulse: 67  SpO2: 98%  Weight: 203 lb 4 oz (92.2 kg)   Filed Weights   01/29/17 1006  Weight: 203 lb 4 oz (92.2 kg)    PHYSICAL EXAM:  General:  Well appearing. No resp distress HEENT: normal Neck: supple. JVP flat Carotids 2+ bilaterally; no bruits. No thyromegaly or nodules. Cor: PMI normal. RRR. No rubs, gallops or murmurs. Non accentuated P2 Lungs: CTA, normal effort Abdomen: overweight, soft, NT, ND. No HSM. No bruits or masses. +BS Extremities: no cyanosis, clubbing, rash, trace ankle edema. Mild slcerodactyly Neuro: alert & orientedx3, cranial nerves grossly intact. Moves all 4 extremities w/o difficulty. Affect pleasant   ASSESSMENT & PLAN:  1. Pulmonary HTN 2. Chronic Diastolic HF 3. OSA - CPAP per Dr Vassie Loll 4. Scleroderma 5. Chronic respiratory failure with exertional desaturations 6. Obesity s/p gastric bypass 07/26/15  Echo, PFTs, cath data and CT scan reviewed. She does have very mild PAH in the setting of scleroderma. However her pressures have improved s/p weight loss surgery. We discussed option for early treatment now or  watchful waiting. We have opted for watchful waiting. Will continue to follow closely. She is due for repeat PFTs with DLCO soon with Dr. Vassie Loll. If DLCO continue to drop will start ERA. I will see her backin 6 months to reassess functional capacity.    Mercedees Convery,MD 10:15 AM

## 2017-01-29 NOTE — Patient Instructions (Signed)
Follow up in 6 Months.

## 2017-02-07 ENCOUNTER — Ambulatory Visit (INDEPENDENT_AMBULATORY_CARE_PROVIDER_SITE_OTHER): Payer: BLUE CROSS/BLUE SHIELD | Admitting: Pulmonary Disease

## 2017-02-07 DIAGNOSIS — I272 Pulmonary hypertension, unspecified: Secondary | ICD-10-CM

## 2017-02-07 DIAGNOSIS — G4733 Obstructive sleep apnea (adult) (pediatric): Secondary | ICD-10-CM

## 2017-02-07 NOTE — Assessment & Plan Note (Signed)
We will check nocturnal oximetry- preferably with a forehead probe if possible-and advise you regarding nocturnal oxygen OSA appeared to have resolved with weight loss

## 2017-02-07 NOTE — Progress Notes (Signed)
   Subjective:    Patient ID: Rachael Camacho, female    DOB: Sep 06, 1966, 51 y.o.   MRN: [id number]  HPI  51 year old never smoker with scleroderma for FU of Nocturnal hypoxia and pulmonary hypertension She had oxygen desaturation on exertion, RHC showed PH - s/o increased pulmonary flow, low PVR  Seen in the past for raynauds', arthritis CPAP was dc'd 08/2015 after HST  02/07/2017  Chief Complaint  Patient presents with  . Follow-up    Wants another PFT to compare to last PFT. Denies any breathing issues.    She continues her follow-up with rheumatology and cardiology. She lost weight with bariatric surgery 07/2015 down to 185 pounds but is now has gained back up to 201 pounds She's not using nocturnal oxygen as much and wonders if she still needs this She underwent repeat echo 01/2017 which showed RVSP of 42 - ERA was deferred   awaiting pulmonaryfunction testing. HRCT showed mil centrilobular groundglass and dilated main pulmonary artery ,   Swallow evaluation was normal, she is scheduled for esophageal dilatation by GI - Mag[facility]  Significant tests/ events  Echo 11/12/14 showed grade 2 diastolic dysfunction, with normal systolic function CT angio 11/2014 was negative for pulmonary embolism, but showed mosaic pattern suggestive of early interstitial edema >> no response to Lasix  01/2015 saturation about 93%-on walking she desaturate to 84%  Home sleep study 02/2015 >> AHI 10.6, SaO2 low 70%. She spent 329.7 min (84.7% of test time) with SaO2 < 90%. Consistent with mild sleep apnea   PFTs 02/2015 no obstruction, mild restriction TLC 78%, DLCO 52%  Right heart cath >> 02/2015 -right atrial pressure 10, pulmonary capillary wedge pressure 14 and PA pressure 50/19. PVR calculates to 2.7 WU (normal range). PA pressures are up due to high output and pulmonary vasodilators not indicated.  Rheum consult >> Nickola Major([facility]) ? Scleroderma  HST 08/2015 >> low AHI, desatn > stay on 2L   O2  RHC 11/2015 PVR 2.8 Wu PFT 11/2015 DLCO 58%   Review of Systems neg for any significant sore throat, dysphagia, itching, sneezing, nasal congestion or excess/ purulent secretions, fever, chills, sweats, unintended wt loss, pleuritic or exertional cp, hempoptysis, orthopnea pnd or change in chronic leg swelling. Also denies presyncope, palpitations, heartburn, abdominal pain, nausea, vomiting, diarrhea or change in bowel or urinary habits, dysuria,hematuria, rash, arthralgias, visual complaints, headache, numbness weakness or ataxia.     Objective:   Physical Exam  Gen. Pleasant, obese, in no distress ENT - no lesions, no post nasal drip Neck: No JVD, no thyromegaly, no carotid bruits Lungs: no use of accessory muscles, no dullness to percussion, decreased without rales or rhonchi  Cardiovascular: Rhythm regular, heart sounds  normal, no murmurs or gallops, no peripheral edema Musculoskeletal: No deformities, no cyanosis or clubbing , no tremors       Assessment & Plan:

## 2017-02-07 NOTE — Patient Instructions (Signed)
Schedule PFTs We will check nocturnal oximetry- preferably with a forehead probe if possible-and advise you regarding nocturnal oxygen

## 2017-02-07 NOTE — Assessment & Plan Note (Signed)
Schedule PFTs  With the combination of slightly increased in RVSP on echo and CT findings suggesting increasing pulmonary hypertension, would advise to start ERA , especially if DLCO is low

## 2017-02-08 ENCOUNTER — Ambulatory Visit (INDEPENDENT_AMBULATORY_CARE_PROVIDER_SITE_OTHER): Payer: BLUE CROSS/BLUE SHIELD | Admitting: Pulmonary Disease

## 2017-02-08 ENCOUNTER — Encounter (HOSPITAL_COMMUNITY): Payer: Self-pay | Admitting: *Deleted

## 2017-02-08 ENCOUNTER — Other Ambulatory Visit: Payer: Self-pay | Admitting: Gastroenterology

## 2017-02-08 DIAGNOSIS — I272 Pulmonary hypertension, unspecified: Secondary | ICD-10-CM | POA: Diagnosis not present

## 2017-02-08 LAB — PULMONARY FUNCTION TEST
DL/VA % PRED: 64 %
DL/VA: 3 ml/min/mmHg/L
DLCO COR: 13.81 ml/min/mmHg
DLCO cor % pred: 60 %
DLCO unc % pred: 59 %
DLCO unc: 13.73 ml/min/mmHg
FEF 25-75 Post: 3.65 L/sec
FEF 25-75 Pre: 3.15 L/sec
FEF2575-%CHANGE-POST: 15 %
FEF2575-%PRED-POST: 138 %
FEF2575-%PRED-PRE: 119 %
FEV1-%CHANGE-POST: 2 %
FEV1-%Pred-Post: 109 %
FEV1-%Pred-Pre: 106 %
FEV1-Post: 2.92 L
FEV1-Pre: 2.83 L
FEV1FVC-%Change-Post: 4 %
FEV1FVC-%PRED-PRE: 103 %
FEV6-%Change-Post: -1 %
FEV6-%Pred-Post: 102 %
FEV6-%Pred-Pre: 103 %
FEV6-Post: 3.36 L
FEV6-Pre: 3.42 L
FEV6FVC-%Pred-Post: 103 %
FEV6FVC-%Pred-Pre: 103 %
FVC-%Change-Post: -1 %
FVC-%Pred-Post: 99 %
FVC-%Pred-Pre: 101 %
FVC-Post: 3.36 L
FVC-Pre: 3.42 L
POST FEV1/FVC RATIO: 87 %
PRE FEV6/FVC RATIO: 100 %
Post FEV6/FVC ratio: 100 %
Pre FEV1/FVC ratio: 83 %
RV % PRED: 68 %
RV: 1.22 L
TLC % pred: 92 %
TLC: 4.52 L

## 2017-02-08 NOTE — Progress Notes (Signed)
PFT done today. Katie Welchel,CMA  

## 2017-02-11 NOTE — Progress Notes (Signed)
Spoke with patient and informed her of results. Pt would like to know what the normal range is as well as if she is going to be started on the ERA medication. Please advise Dr. Vassie LollAlva. Thank you!

## 2017-02-12 ENCOUNTER — Ambulatory Visit (HOSPITAL_COMMUNITY): Payer: BLUE CROSS/BLUE SHIELD

## 2017-02-12 ENCOUNTER — Ambulatory Visit (HOSPITAL_COMMUNITY): Payer: BLUE CROSS/BLUE SHIELD | Admitting: Anesthesiology

## 2017-02-12 ENCOUNTER — Ambulatory Visit (HOSPITAL_COMMUNITY)
Admission: RE | Admit: 2017-02-12 | Discharge: 2017-02-12 | Disposition: A | Discharge status: Home / Self Care | Payer: BLUE CROSS/BLUE SHIELD | Source: Ambulatory Visit | Attending: Gastroenterology | Admitting: Gastroenterology

## 2017-02-12 ENCOUNTER — Other Ambulatory Visit: Payer: Self-pay | Admitting: Gastroenterology

## 2017-02-12 ENCOUNTER — Encounter (HOSPITAL_COMMUNITY): Payer: Self-pay | Admitting: *Deleted

## 2017-02-12 ENCOUNTER — Encounter (HOSPITAL_COMMUNITY)
Admission: RE | Disposition: A | Discharge status: Home / Self Care | Payer: Self-pay | Source: Ambulatory Visit | Attending: Gastroenterology

## 2017-02-12 DIAGNOSIS — R1310 Dysphagia, unspecified: Secondary | ICD-10-CM | POA: Diagnosis not present

## 2017-02-12 DIAGNOSIS — K222 Esophageal obstruction: Secondary | ICD-10-CM

## 2017-02-12 DIAGNOSIS — Z79899 Other long term (current) drug therapy: Secondary | ICD-10-CM | POA: Insufficient documentation

## 2017-02-12 DIAGNOSIS — Z9884 Bariatric surgery status: Secondary | ICD-10-CM | POA: Insufficient documentation

## 2017-02-12 DIAGNOSIS — R933 Abnormal findings on diagnostic imaging of other parts of digestive tract: Secondary | ICD-10-CM | POA: Diagnosis not present

## 2017-02-12 HISTORY — PX: SAVORY DILATION: SHX5439

## 2017-02-12 HISTORY — PX: ESOPHAGOGASTRODUODENOSCOPY (EGD) WITH PROPOFOL: SHX5813

## 2017-02-12 SURGERY — ESOPHAGOGASTRODUODENOSCOPY (EGD) WITH PROPOFOL
Anesthesia: Monitor Anesthesia Care

## 2017-02-12 MED ORDER — SODIUM CHLORIDE 0.9 % IV SOLN: INTRAVENOUS | Status: DC

## 2017-02-12 MED ORDER — ONDANSETRON HCL 4 MG/2ML IJ SOLN
INTRAMUSCULAR | Status: AC
  Filled 2017-02-12: qty 2

## 2017-02-12 MED ORDER — PROPOFOL 10 MG/ML IV BOLUS
INTRAVENOUS | Status: DC | PRN
  Administered 2017-02-12 (×2): 20 mg via INTRAVENOUS
  Administered 2017-02-12: 30 mg via INTRAVENOUS
  Administered 2017-02-12: 20 mg via INTRAVENOUS
  Administered 2017-02-12: 10 mg via INTRAVENOUS

## 2017-02-12 MED ORDER — ONDANSETRON HCL 4 MG/2ML IJ SOLN
INTRAMUSCULAR | Status: DC | PRN
  Administered 2017-02-12: 4 mg via INTRAVENOUS

## 2017-02-12 MED ORDER — PROPOFOL 10 MG/ML IV BOLUS
INTRAVENOUS | Status: AC
  Filled 2017-02-12: qty 40

## 2017-02-12 MED ORDER — LIDOCAINE 2% (20 MG/ML) 5 ML SYRINGE
INTRAMUSCULAR | Status: AC
  Filled 2017-02-12: qty 5

## 2017-02-12 MED ORDER — LIDOCAINE 2% (20 MG/ML) 5 ML SYRINGE
INTRAMUSCULAR | Status: DC | PRN
  Administered 2017-02-12: 50 mg via INTRAVENOUS

## 2017-02-12 MED ORDER — LACTATED RINGERS IV SOLN
INTRAVENOUS | Status: DC
  Administered 2017-02-12: 1000 mL via INTRAVENOUS

## 2017-02-12 MED ORDER — PROPOFOL 500 MG/50ML IV EMUL
INTRAVENOUS | Status: DC | PRN
  Administered 2017-02-12: 150 ug/kg/min via INTRAVENOUS

## 2017-02-12 SURGICAL SUPPLY — 15 items

## 2017-02-12 NOTE — Progress Notes (Signed)
Rachael Camacho 1:53 PM  Subjective: Patient with no new complaints since we saw her recently in the office and her barium swallow was reviewed  Objective: Vital signs stable afebrile no acute distress exam please see preassessment evaluation  Assessment: Dysphasia abnormal barium swallow in patient with a gastric sleeve  Plan: Okay to proceed with EGD probable dilation with anesthesia assistance  [facility],[name]  Pager [phone] After 5PM or if no answer call [phone]

## 2017-02-12 NOTE — Anesthesia Preprocedure Evaluation (Signed)
Anesthesia Evaluation  Patient identified by MRN, date of birth, ID band Patient awake    Reviewed: Allergy & Precautions, NPO status , Patient's Chart, lab work & pertinent test results  Airway Mallampati: II  TM Distance: >3 FB Neck ROM: Full    Dental no notable dental hx.    Pulmonary sleep apnea ,    Pulmonary exam normal breath sounds clear to auscultation       Cardiovascular negative cardio ROS Normal cardiovascular exam Rhythm:Regular Rate:Normal     Neuro/Psych negative neurological ROS  negative psych ROS   GI/Hepatic negative GI ROS, Neg liver ROS,   Endo/Other    Renal/GU negative Renal ROS  negative genitourinary   Musculoskeletal negative musculoskeletal ROS (+)   Abdominal   Peds negative pediatric ROS (+)  Hematology negative hematology ROS (+)   Anesthesia Other Findings   Reproductive/Obstetrics negative OB ROS                             Anesthesia Physical Anesthesia Plan  ASA: II  Anesthesia Plan: MAC   Post-op Pain Management:    Induction: Intravenous  Airway Management Planned: Simple Face Mask  Additional Equipment:   Intra-op Plan:   Post-operative Plan:   Informed Consent: I have reviewed the patients History and Physical, chart, labs and discussed the procedure including the risks, benefits and alternatives for the proposed anesthesia with the patient or authorized representative who has indicated his/her understanding and acceptance.   Dental advisory given  Plan Discussed with: CRNA and Surgeon  Anesthesia Plan Comments:         Anesthesia Quick Evaluation

## 2017-02-12 NOTE — Transfer of Care (Signed)
Immediate Anesthesia Transfer of Care Note  Patient: Rachael Camacho  Procedure(s) Performed: Procedure(s): ESOPHAGOGASTRODUODENOSCOPY (EGD) WITH PROPOFOL (N/A) SAVORY DILATION (N/A)  Patient Location: PACU  Anesthesia Type:MAC  Level of Consciousness:  sedated, patient cooperative and responds to stimulation  Airway & Oxygen Therapy:Patient Spontanous Breathing and Patient connected to face mask oxgen  Post-op Assessment:  Report given to PACU RN and Post -op Vital signs reviewed and stable  Post vital signs:  Reviewed and stable  Last Vitals:  Vitals:   02/12/17 1317  BP: 133/71  Pulse: 64  Resp: 15  Temp: 90.2 C    Complications: No apparent anesthesia complications

## 2017-02-12 NOTE — Discharge Instructions (Signed)
YOU HAD AN ENDOSCOPIC PROCEDURE TODAY: Refer to the procedure report and other information in the discharge instructions given to you for any specific questions about what was found during the examination. If this information does not answer your questions, please call [facility] office at [phone] to clarify.   YOU SHOULD EXPECT: Some feelings of bloating in the abdomen. Passage of more gas than usual. Walking can help get rid of the air that was put into your GI tract during the procedure and reduce the bloating. If you had a lower endoscopy (such as a colonoscopy or flexible sigmoidoscopy) you may notice spotting of blood in your stool or on the toilet paper. Some abdominal soreness may be present for a day or two, also.  DIET: Your first meal following the procedure should be a light meal and then it is ok to progress to your normal diet. A half-sandwich or bowl of soup is an example of a good first meal. Heavy or fried foods are harder to digest and may make you feel nauseous or bloated. Drink plenty of fluids but you should avoid alcoholic beverages for 24 hours. If you had a esophageal dilation, please see attached instructions for diet.   ACTIVITY: Your care partner should take you home directly after the procedure. You should plan to take it easy, moving slowly for the rest of the day. You can resume normal activity the day after the procedure however YOU SHOULD NOT DRIVE, use power tools, machinery or perform tasks that involve climbing or major physical exertion for 24 hours (because of the sedation medicines used during the test).   SYMPTOMS TO REPORT IMMEDIATELY: A gastroenterologist can be reached at any hour. Please call [phone]  for any of the following symptoms:  Following lower endoscopy (colonoscopy, flexible sigmoidoscopy) Excessive amounts of blood in the stool  Significant tenderness, worsening of abdominal pains  Swelling of the abdomen that is new, acute  Fever of 100 or  higher  Following upper endoscopy (EGD, EUS, ERCP, esophageal dilation) Vomiting of blood or coffee ground material  New, significant abdominal pain  New, significant chest pain or pain under the shoulder blades  Painful or persistently difficult swallowing  New shortness of breath  Black, tarry-looking or red, bloody stools  FOLLOW UP:  If any biopsies were taken you will be contacted by phone or by letter within the next 1-3 weeks. Call [phone]  if you have not heard about the biopsies in 3 weeks.  Please also call with any specific questions about appointments or follow up tests. Call if question or problem or if swallowing is not any better in few weeks otherwise soft diet today and slowly advance tomorrow and follow-up in 2 months

## 2017-02-12 NOTE — Op Note (Signed)
[facility] Patient Name: Rachael Camacho Procedure Date: 02/12/2017 MRN: [id number] Attending MD: Vida Rigger , MD Date of Birth: 1966-07-05 CSN: [id number] Age: 51 Admit Type: Outpatient Procedure:                Upper GI endoscopy Indications:              Dysphagia, Abnormal UGI series questionable                            stricture Providers:                Vida Rigger, MD, Omelia Blackwater RN, RN, Oletha Blend, Technician Referring MD:              Medicines:                Propofol total dose 300 mg IV, Ondansetron 4 mg IV                            50 mg IV lidocaine Complications:            No immediate complications. Estimated Blood Loss:     Estimated blood loss: none. Procedure:                Pre-Anesthesia Assessment:                           - Prior to the procedure, a History and Physical                            was performed, and patient medications and                            allergies were reviewed. The patient's tolerance of                            previous anesthesia was also reviewed. The risks                            and benefits of the procedure and the sedation                            options and risks were discussed with the patient.                            All questions were answered, and informed consent                            was obtained. Prior Anticoagulants: The patient has                            taken no previous anticoagulant or antiplatelet                            agents.  ASA Grade Assessment: II - A patient with                            mild systemic disease. After reviewing the risks                            and benefits, the patient was deemed in                            satisfactory condition to undergo the procedure.                           After obtaining informed consent, the endoscope was                            passed under direct vision. Throughout the                            procedure, the patient's blood pressure, pulse, and                            oxygen saturations were monitored continuously. The                            [id number]-[id number] ([id number]) scope was introduced through the                            mouth, and advanced to the second part of duodenum.                            The upper GI endoscopy was accomplished without                            difficulty. The patient tolerated the procedure                            well. Scope In: Scope Out: Findings:      The larynx was normal.      I did not see an obvious benign-appearing, intrinsic stenosis although       there may have been some distal esophagus narrowing And it was       traversed. at the end of the procedure A guidewire was placed under       fluoroscopic guidance and the scope was withdrawn. Dilation was       performed with a Savary dilator with no resistance and no heme at 15 mm       and then 16 mm.      Evidence of a sleeve gastrectomy was found in the greater curvature of       the stomach. This was characterized by healthy appearing mucosa.      The duodenal bulb, first portion of the duodenum and second portion of       the duodenum were normal.      The exam was otherwise without abnormality. Impression:               -  Normal larynx.                           - doubt a Benign-appearing esophageal stenosis.                            Dilated 216 mm under fluoroscopy guidance as above.                           - A sleeve gastrectomy was found, characterized by                            healthy appearing mucosa.                           - Normal duodenal bulb, first portion of the                            duodenum and second portion of the duodenum.                           - The examination was otherwise normal.                           - No specimens collected. Moderate Sedation:      N/A- Per Anesthesia Care Recommendation:           -  Patient has a contact number available for                            emergencies. The signs and symptoms of potential                            delayed complications were discussed with the                            patient. Return to normal activities tomorrow.                            Written discharge instructions were provided to the                            patient.                           - Soft diet today.                           - Continue present medications.                           - Return to GI clinic in 2 months.                           - Telephone GI clinic if symptomatic PRN. Procedure Code(s):        --- Professional ---  16109, Esophagogastroduodenoscopy, flexible,                            transoral; with insertion of guide wire followed by                            passage of dilator(s) through esophagus over guide                            wire Diagnosis Code(s):        --- Professional ---                           K22.2, Esophageal obstruction                           Z98.84, Bariatric surgery status                           R13.10, Dysphagia, unspecified                           R93.3, Abnormal findings on diagnostic imaging of                            other parts of digestive tract CPT copyright 2016 American Medical Association. All rights reserved. The codes documented in this report are preliminary and upon coder review may  be revised to meet current compliance requirements. Vida Rigger, MD 02/12/2017 2:39:11 PM This report has been signed electronically. Number of Addenda: 0

## 2017-02-12 NOTE — Anesthesia Postprocedure Evaluation (Addendum)
Anesthesia Post Note  Patient: Rachael Camacho  Procedure(s) Performed: Procedure(s) (LRB): ESOPHAGOGASTRODUODENOSCOPY (EGD) WITH PROPOFOL (N/A) SAVORY DILATION (N/A)  Patient location during evaluation: PACU Anesthesia Type: MAC Level of consciousness: awake and alert Pain management: pain level controlled Vital Signs Assessment: post-procedure vital signs reviewed and stable Respiratory status: spontaneous breathing, nonlabored ventilation, respiratory function stable and patient connected to nasal cannula oxygen Cardiovascular status: stable and blood pressure returned to baseline Anesthetic complications: no       Last Vitals:  Vitals:   02/12/17 1450 02/12/17 1452  BP:  95/64  Pulse: 64 64  Resp: 15 10  Temp:      Last Pain:  Vitals:   02/12/17 1438  TempSrc: Oral                 Aundrea Horace S

## 2017-02-13 ENCOUNTER — Encounter (HOSPITAL_COMMUNITY): Payer: Self-pay | Admitting: Gastroenterology

## 2017-02-14 ENCOUNTER — Ambulatory Visit: Payer: BLUE CROSS/BLUE SHIELD | Admitting: Skilled Nursing Facility1

## 2017-02-14 ENCOUNTER — Telehealth: Payer: Self-pay | Admitting: Pulmonary Disease

## 2017-02-14 DIAGNOSIS — I272 Pulmonary hypertension, unspecified: Secondary | ICD-10-CM

## 2017-02-14 DIAGNOSIS — G4733 Obstructive sleep apnea (adult) (pediatric): Secondary | ICD-10-CM

## 2017-02-14 NOTE — Telephone Encounter (Addendum)
Spoke with pt and she was follow up on a question she asked when her PFT results were given. I informed her of RA response back (see below), she had no further questions as far as that. She also stated [facility]. Left a vm with the Respiratory therapist to see where they were on scheduling her. She stated [facility] is [facility]    Notes Recorded by Oretha Milchakesh Alva V, MD on 02/12/2017 at 8:38 AM EST 80% Defer to dr bensimhon

## 2017-02-20 ENCOUNTER — Telehealth (HOSPITAL_COMMUNITY): Payer: Self-pay | Admitting: *Deleted

## 2017-02-20 ENCOUNTER — Other Ambulatory Visit (HOSPITAL_COMMUNITY): Payer: Self-pay | Admitting: *Deleted

## 2017-02-20 DIAGNOSIS — I272 Pulmonary hypertension, unspecified: Secondary | ICD-10-CM

## 2017-02-20 NOTE — Telephone Encounter (Signed)
Pt called stating she was wanting to start on the Opsumit, Dr Gala RomneyBensimhon had recommended at her last OV.  Discussed w/Dr Bensimhon, he states if pt is interested she will need repeat RHC as her last one was done in Dec 2016.  Pt aware and agreeable, RHC sch for 3/19.

## 2017-02-21 NOTE — Telephone Encounter (Signed)
Called [facility], LM for Melissa x 1

## 2017-02-22 NOTE — Telephone Encounter (Signed)
Spoke with [facility] who has the order and they stated that they will reach out to the pt today about getting her scheduled. Contacted the pt and informed her of the information. She had no further questions a this time. Nothing further is needed at this time.

## 2017-03-11 ENCOUNTER — Ambulatory Visit (HOSPITAL_COMMUNITY)
Admission: RE | Admit: 2017-03-11 | Payer: BLUE CROSS/BLUE SHIELD | Source: Ambulatory Visit | Admitting: Internal Medicine

## 2017-03-11 ENCOUNTER — Telehealth (HOSPITAL_COMMUNITY): Payer: Self-pay | Admitting: *Deleted

## 2017-03-11 ENCOUNTER — Encounter (HOSPITAL_COMMUNITY): Admission: RE | Payer: Self-pay | Source: Ambulatory Visit

## 2017-03-11 DIAGNOSIS — I270 Primary pulmonary hypertension: Secondary | ICD-10-CM

## 2017-03-11 SURGERY — RIGHT HEART CATH
Anesthesia: LOCAL

## 2017-03-11 NOTE — Telephone Encounter (Signed)
Patient called in to reschedule her cath from this morning due to a death in the family.  I have rescheduled her for Monday March 26th at 7:30 am.  Patient is aware she needs to arrive at short stay at 5:30 am. Patient will have labs drawn for the cath this Wednesday. Appointment made and lab orders placed.

## 2017-03-12 ENCOUNTER — Ambulatory Visit: Payer: BLUE CROSS/BLUE SHIELD | Admitting: Skilled Nursing Facility1

## 2017-03-13 ENCOUNTER — Ambulatory Visit (HOSPITAL_COMMUNITY)
Admission: RE | Admit: 2017-03-13 | Discharge: 2017-03-13 | Disposition: A | Discharge status: Home / Self Care | Payer: BLUE CROSS/BLUE SHIELD | Source: Ambulatory Visit | Attending: Internal Medicine | Admitting: Internal Medicine

## 2017-03-13 DIAGNOSIS — I270 Primary pulmonary hypertension: Secondary | ICD-10-CM | POA: Insufficient documentation

## 2017-03-13 LAB — BASIC METABOLIC PANEL
ANION GAP: 6 (ref 5–15)
BUN: 10 mg/dL (ref 6–20)
CALCIUM: 9.3 mg/dL (ref 8.9–10.3)
CO2: 27 mmol/L (ref 22–32)
Chloride: 109 mmol/L (ref 101–111)
Creatinine, Ser: 0.75 mg/dL (ref 0.44–1.00)
GFR calc Af Amer: >60 mL/min (ref >60)
Glucose, Bld: 106 mg/dL — ABNORMAL HIGH (ref 65–99)
Potassium: 3.8 mmol/L (ref 3.5–5.1)
Sodium: 142 mmol/L (ref 135–145)

## 2017-03-13 LAB — CBC
HCT: 40.9 % (ref 36.0–46.0)
HEMOGLOBIN: 13.7 g/dL (ref 12.0–15.0)
MCH: 30.1 pg (ref 26.0–34.0)
MCHC: 33.5 g/dL (ref 30.0–36.0)
MCV: 89.9 fL (ref 78.0–100.0)
PLATELETS: 235 10*3/uL (ref 150–400)
RBC: 4.55 MIL/uL (ref 3.87–5.11)
RDW: 13 % (ref 11.5–15.5)
WBC: 7.3 10*3/uL (ref 4.0–10.5)

## 2017-03-13 LAB — PROTIME-INR
INR: 1.04
PROTHROMBIN TIME: 13.6 s (ref 11.4–15.2)

## 2017-03-18 ENCOUNTER — Encounter (HOSPITAL_COMMUNITY): Payer: Self-pay | Admitting: Internal Medicine

## 2017-03-18 ENCOUNTER — Ambulatory Visit (HOSPITAL_COMMUNITY)
Admission: RE | Admit: 2017-03-18 | Discharge: 2017-03-18 | Disposition: A | Discharge status: Home / Self Care | Payer: BLUE CROSS/BLUE SHIELD | Source: Ambulatory Visit | Attending: Internal Medicine | Admitting: Internal Medicine

## 2017-03-18 ENCOUNTER — Telehealth: Payer: Self-pay | Admitting: Pulmonary Disease

## 2017-03-18 ENCOUNTER — Encounter (HOSPITAL_COMMUNITY)
Admission: RE | Disposition: A | Discharge status: Home / Self Care | Payer: Self-pay | Source: Ambulatory Visit | Attending: Internal Medicine

## 2017-03-18 DIAGNOSIS — K589 Irritable bowel syndrome without diarrhea: Secondary | ICD-10-CM | POA: Insufficient documentation

## 2017-03-18 DIAGNOSIS — M1990 Unspecified osteoarthritis, unspecified site: Secondary | ICD-10-CM | POA: Diagnosis not present

## 2017-03-18 DIAGNOSIS — Z880 Allergy status to penicillin: Secondary | ICD-10-CM | POA: Insufficient documentation

## 2017-03-18 DIAGNOSIS — Z885 Allergy status to narcotic agent status: Secondary | ICD-10-CM | POA: Insufficient documentation

## 2017-03-18 DIAGNOSIS — Z8249 Family history of ischemic heart disease and other diseases of the circulatory system: Secondary | ICD-10-CM | POA: Diagnosis not present

## 2017-03-18 DIAGNOSIS — I2721 Secondary pulmonary arterial hypertension: Secondary | ICD-10-CM | POA: Diagnosis present

## 2017-03-18 DIAGNOSIS — I7300 Raynaud's syndrome without gangrene: Secondary | ICD-10-CM | POA: Insufficient documentation

## 2017-03-18 DIAGNOSIS — E669 Obesity, unspecified: Secondary | ICD-10-CM | POA: Diagnosis not present

## 2017-03-18 DIAGNOSIS — Z882 Allergy status to sulfonamides status: Secondary | ICD-10-CM | POA: Diagnosis not present

## 2017-03-18 DIAGNOSIS — Z9884 Bariatric surgery status: Secondary | ICD-10-CM | POA: Diagnosis not present

## 2017-03-18 DIAGNOSIS — Z6834 Body mass index (BMI) 34.0-34.9, adult: Secondary | ICD-10-CM | POA: Insufficient documentation

## 2017-03-18 DIAGNOSIS — G4733 Obstructive sleep apnea (adult) (pediatric): Secondary | ICD-10-CM | POA: Insufficient documentation

## 2017-03-18 DIAGNOSIS — M349 Systemic sclerosis, unspecified: Secondary | ICD-10-CM | POA: Insufficient documentation

## 2017-03-18 DIAGNOSIS — K219 Gastro-esophageal reflux disease without esophagitis: Secondary | ICD-10-CM | POA: Diagnosis not present

## 2017-03-18 DIAGNOSIS — I5032 Chronic diastolic (congestive) heart failure: Secondary | ICD-10-CM | POA: Insufficient documentation

## 2017-03-18 DIAGNOSIS — J8410 Pulmonary fibrosis, unspecified: Secondary | ICD-10-CM | POA: Diagnosis not present

## 2017-03-18 DIAGNOSIS — R0600 Dyspnea, unspecified: Secondary | ICD-10-CM

## 2017-03-18 HISTORY — PX: RIGHT HEART CATH: CATH118263

## 2017-03-18 LAB — POCT I-STAT 3, VENOUS BLOOD GAS (G3P V)
ACID-BASE DEFICIT: 1 mmol/L (ref 0.0–2.0)
Acid-base deficit: 1 mmol/L (ref 0.0–2.0)
BICARBONATE: 24.4 mmol/L (ref 20.0–28.0)
Bicarbonate: 24.7 mmol/L (ref 20.0–28.0)
Bicarbonate: 25.6 mmol/L (ref 20.0–28.0)
O2 SAT: 68 %
O2 Saturation: 74 %
O2 Saturation: 73 %
PCO2 VEN: 43.5 mmHg — AB (ref 44.0–60.0)
PCO2 VEN: 45.2 mmHg (ref 44.0–60.0)
PH VEN: 7.358 (ref 7.250–7.430)
PH VEN: 7.363 (ref 7.250–7.430)
PO2 VEN: 37 mmHg (ref 32.0–45.0)
PO2 VEN: 41 mmHg (ref 32.0–45.0)
TCO2: 26 mmol/L (ref 0–100)
TCO2: 26 mmol/L (ref 0–100)
TCO2: 27 mmol/L (ref 0–100)
pCO2, Ven: 45.1 mmHg (ref 44.0–60.0)
pH, Ven: 7.346 (ref 7.250–7.430)
pO2, Ven: 40 mmHg (ref 32.0–45.0)

## 2017-03-18 SURGERY — RIGHT HEART CATH
Anesthesia: LOCAL

## 2017-03-18 MED ORDER — FENTANYL CITRATE (PF) 100 MCG/2ML IJ SOLN
INTRAMUSCULAR | Status: AC
  Filled 2017-03-18: qty 2

## 2017-03-18 MED ORDER — ACETAMINOPHEN 325 MG PO TABS: 650.0000 mg | ORAL_TABLET | ORAL | Status: DC | PRN

## 2017-03-18 MED ORDER — MIDAZOLAM HCL 2 MG/2ML IJ SOLN
INTRAMUSCULAR | Status: DC | PRN
  Administered 2017-03-18: 1 mg via INTRAVENOUS

## 2017-03-18 MED ORDER — HEPARIN (PORCINE) IN NACL 2-0.9 UNIT/ML-% IJ SOLN
INTRAMUSCULAR | Status: DC | PRN
  Administered 2017-03-18: 1000 mL

## 2017-03-18 MED ORDER — SODIUM CHLORIDE 0.9% FLUSH: 3.0000 mL | INTRAVENOUS | Status: DC | PRN

## 2017-03-18 MED ORDER — FENTANYL CITRATE (PF) 100 MCG/2ML IJ SOLN
INTRAMUSCULAR | Status: DC | PRN
  Administered 2017-03-18: 25 ug via INTRAVENOUS

## 2017-03-18 MED ORDER — LIDOCAINE HCL (PF) 1 % IJ SOLN
INTRAMUSCULAR | Status: DC | PRN
  Administered 2017-03-18: 2 mL via INTRADERMAL

## 2017-03-18 MED ORDER — SODIUM CHLORIDE 0.9 % IV SOLN
INTRAVENOUS | Status: DC
  Administered 2017-03-18: 07:00:00 via INTRAVENOUS

## 2017-03-18 MED ORDER — ONDANSETRON HCL 4 MG/2ML IJ SOLN: 4.0000 mg | Freq: Four times a day (QID) | INTRAMUSCULAR | Status: DC | PRN

## 2017-03-18 MED ORDER — HEPARIN (PORCINE) IN NACL 2-0.9 UNIT/ML-% IJ SOLN
INTRAMUSCULAR | Status: AC
  Filled 2017-03-18: qty 1000

## 2017-03-18 MED ORDER — SODIUM CHLORIDE 0.9% FLUSH: 3.0000 mL | Freq: Two times a day (BID) | INTRAVENOUS | Status: DC

## 2017-03-18 MED ORDER — LIDOCAINE HCL (PF) 1 % IJ SOLN
INTRAMUSCULAR | Status: AC
  Filled 2017-03-18: qty 30

## 2017-03-18 MED ORDER — ASPIRIN 81 MG PO CHEW
CHEWABLE_TABLET | ORAL | Status: AC
  Administered 2017-03-18: 81 mg
  Filled 2017-03-18: qty 1

## 2017-03-18 MED ORDER — SODIUM CHLORIDE 0.9 % IV SOLN: 250.0000 mL | INTRAVENOUS | Status: DC | PRN

## 2017-03-18 MED ORDER — MIDAZOLAM HCL 2 MG/2ML IJ SOLN
INTRAMUSCULAR | Status: AC
  Filled 2017-03-18: qty 2

## 2017-03-18 SURGICAL SUPPLY — 6 items
CATH BALLN WEDGE 5F 110CM (CATHETERS) ×1 IMPLANT
KIT HEART LEFT (KITS) ×2 IMPLANT
PACK CARDIAC CATHETERIZATION (CUSTOM PROCEDURE TRAY) ×2 IMPLANT
SHEATH FAST CATH BRACH 5F 5CM (SHEATH) ×1 IMPLANT
TRANSDUCER W/STOPCOCK (MISCELLANEOUS) ×2 IMPLANT
TUBING CIL FLEX 10 FLL-RA (TUBING) ×1 IMPLANT

## 2017-03-18 NOTE — Telephone Encounter (Signed)
Pl order ONO on RA

## 2017-03-18 NOTE — Interval H&P Note (Signed)
History and Physical Interval Note:  03/18/2017 7:10 AM  Burley SaverAngela D Cheuvront  has presented today for surgery, with the diagnosis of hp  The various methods of treatment have been discussed with the patient and family. After consideration of risks, benefits and other options for treatment, the patient has consented to  Procedure(s): Right Heart Cath (N/A) as a surgical intervention .  The patient's history has been reviewed, patient examined, no change in status, stable for surgery.  I have reviewed the patient's chart and labs.  Questions were answered to the patient's satisfaction.     Tamya Denardo, Reuel Boomaniel

## 2017-03-18 NOTE — H&P (Signed)
ADVANCED HF TEAM H&P   Primary Cardiologist: Jens Som  HPI:  Rachael Camacho is a 51 year old with a history of  OSA, chornic diastolic HF, obesity, scleroderma, and raynauds disease referred to HF clinic by Dr Vassie Loll.   A year ago she was exercising without difficulty. She was able to do ballroom dancing and walking on the treadmill a mile or two without difficulty. Last September she noticed dyspnea with steps. Had another function decline in October and had increased dyspnea walking a block.  Symptoms progressed in October and now SOB with minimal activity. She says her weight has been 250 pounds for the last 2 years.  Has been seen by Dr Vassie Loll and had PFTs as below. Also referred for RHC in 3/16 with mild PH but PVR 2.8 WU.[name] Started 20 mg po lasix in the fall of last year.   She reports today for Pulmonary hypertension follow up.  She had gastric bypass surgery on 07/26/15 and is down 65 lbs so far. She feels better but still has exertional dyspnea. No CP, orthopnea or PND + LE edema.  I saw her in clinic in February. Echo showed normal LVEF but RVSP was estimated in the 40s  and the decision ws made to proceed with RHC to evaluate for PAH in setting of scleroderma.  She presents for RHC.  RHC 12/16 RA = 7 RV = 42/2/8 PA = 44/16 (28) PCW = 9 Fick cardiac output/index = 6.9/3.6 PVR = 2.8 WU Ao sat = 95% PA sat = 73% 76%  PFTs 03/09/15 no obstruction, mild restriction TLC 78%, DLCO 52% predicted PFTs  11/16 FEV1 2.75L FVC   3.17L DLCO 52%   Review of Systems: [y] = yes, [ ]  = no   General: Weight gain [ ] ; Weight loss Cove.Etienne ]; Anorexia [ ] ; Fatigue Cove.Etienne ]; Fever [ ] ; Chills [ ] ; Weakness [ ]   Cardiac: Chest pain/pressure [ ] ; Resting SOB [ ] ; Exertional SOB Cove.Etienne ]; Orthopnea [ ] ; Pedal Edema Cove.Etienne ]; Palpitations [ ] ; Syncope [ ] ; Presyncope [ ] ; Paroxysmal nocturnal dyspnea[ ]   Pulmonary: Cough [ ] ; Wheezing[ ] ; Hemoptysis[ ] ; Sputum [ ] ; Snoring [ ]   GI: Vomiting[ ] ; Dysphagia[ ] ; Melena[ ] ;  Hematochezia [ ] ; Heartburn[ y]; Abdominal pain [ ] ; Constipation [ ] ; Diarrhea [ ] ; BRBPR [ ]   GU: Hematuria[ ] ; Dysuria [ ] ; Nocturia[ ]   Vascular: Pain in legs with walking [ ] ; Pain in feet with lying flat [ ] ; Non-healing sores [ ] ; Stroke [ ] ; TIA [ ] ; Slurred speech [ ] ;  Neuro: Headaches[ ] ; Vertigo[ ] ; Seizures[ ] ; Paresthesias[ ] ;Blurred vision [ ] ; Diplopia [ ] ; Vision changes [ ]   Ortho/Skin: Arthritis Cove.Etienne ]; Joint pain Cove.Etienne ]; Muscle pain [ ] ; Joint swelling y[ ] ; Back Pain [ ] ; Rash [ ]   Psych: Depression[ ] ; Anxiety[ ]   Heme: Bleeding problems [ ] ; Clotting disorders [ ] ; Anemia Cove.Etienne ]  Endocrine: Diabetes [ ] ; Thyroid dysfunction[ ]    Past Medical History:  Diagnosis Date  . Anemia    as child in first grade  . Arthritis   . Family history of adverse reaction to anesthesia    mom has had problems in past  . GERD (gastroesophageal reflux disease)   . Gestational diabetes mellitus in childbirth, diet controlled 1987  . IBS (irritable bowel syndrome)   . Raynaud disease    hands-toes  . Shortness of breath dyspnea    pulmonary fibrosis-scleraderma  . Sleep  apnea   . Wears glasses     Current Facility-Administered Medications  Medication Dose Route Frequency Provider Last Rate Last Dose  . 0.9 %  sodium chloride infusion  250 mL Intravenous PRN Dolores Pattyaniel R Fidel Caggiano, MD      . 0.9 %  sodium chloride infusion   Intravenous Continuous Dolores Pattyaniel R Medora Roorda, MD 10 mL/hr at 03/18/17 [phone]    . sodium chloride flush (NS) 0.9 % injection 3 mL  3 mL Intravenous Q12H Bevelyn Bucklesaniel R Fahim Kats, MD      . sodium chloride flush (NS) 0.9 % injection 3 mL  3 mL Intravenous PRN Dolores Pattyaniel R Dala Breault, MD        Allergies  Allergen Reactions  . Amoxicillin Other (See Comments)    Immediate yeast infection  Has patient had a PCN reaction causing immediate rash, facial/tongue/throat swelling, SOB or lightheadedness with hypotension: No Has patient had a PCN reaction causing severe rash involving mucus  membranes or skin necrosis: No Has patient had a PCN reaction that required hospitalization: No Has patient had a PCN reaction occurring within the last 10 years: No If all of the above answers are "NO", then may proceed with Cephalosporin use.   [name]. Percocet [Oxycodone-Acetaminophen] Itching    Tolerates acetaminophen alone  . [name] [Nitrofurantoin Monohyd Macro] Rash and Hives  . [name] Hives and Rash    Reported chicken pox-like rash, not hives  . Sulfa Antibiotics Rash and Hives      Social History   Social History  . Marital status: Married    Spouse name: N/A  . Number of children: 2  . Years of education: N/A   Occupational History  .      Office Work   Social History Main Topics  . Smoking status: Never Smoker  . Smokeless tobacco: Never Used  . Alcohol use 0.0 oz/week     Comment: occ  . Drug use: No  . Sexual activity: Not on file   Other Topics Concern  . Not on file   Social History Narrative  . No narrative on file      Family History  Problem Relation Age of Onset  . Heart failure Mother     Stent put in 2014 for blockage  . COPD Mother   . Heart disease Father   . Heart disease Son     Repair ASD    Vitals:   03/18/17 0557  BP: 124/87  Pulse: 64  Resp: 18  Temp: 97.8 F (36.6 C)  TempSrc: Oral  SpO2: 99%  Weight: 90.7 kg (200 lb)  Height: 5\' 4"  (1.626 m)    PHYSICAL EXAM: General:  Well appearing. No respiratory difficulty HEENT: normal Neck: supple. JVP 6-7. Carotids 2+ bilat; no bruits. No lymphadenopathy or thryomegaly appreciated. Cor: PMI nondisplaced. Regular rate & rhythm. No rubs, gallops or murmurs. Lungs: clear Abdomen: obese soft, nontender, nondistended. No hepatosplenomegaly. No bruits or masses. Good bowel sounds. Extremities: obese no cyanosis, clubbing, rash, edema Neuro: alert & oriented x 3, cranial nerves grossly intact. moves all 4 extremities w/o difficulty. Affect pleasant.   ASSESSMENT & PLAN: 1.  Scleroderma 2. Dyspnea on exertion 3. Pulmonary HTN 4. Obesity  Will proceed with RHC today to evaluate for progression of her PAH in setting of scleroderma.   Arvilla MeresBensimhon, Malasha Kleppe, MD  7:09 AM

## 2017-03-18 NOTE — Discharge Instructions (Signed)
Call Dr Gala RomneyBensimhon if any problems,questions, or concerns; call if any redness,drainage,fever,pain,swelling or bleeding left arm site

## 2017-03-18 NOTE — Telephone Encounter (Signed)
RA I see where at the last OV 2/15 you said that you wanted to check a nocturnal oximetry--I do not see where this was ordered.  Do you still want this done still ?  Please advise.  thanks

## 2017-03-19 NOTE — Telephone Encounter (Signed)
Order has been placed.

## 2017-03-21 IMAGING — CT CT CHEST HIGH RESOLUTION W/O CM
2 of 7 series · 11 of 36 positions shown, 13 images · non-contrast
Comparison: 11/26/2014 chest CT angiogram. 06/29/2015 chest
radiograph.

CLINICAL DATA: Reported history of diastolic congestive heart
failure and lobe DLCO. Shortness of breath. Nonsmoker.

EXAM:
CT CHEST WITHOUT CONTRAST
TECHNIQUE: Multidetector CT imaging of the chest was performed following the
standard protocol without intravenous contrast. High resolution
imaging of the lungs, as well as inspiratory and expiratory imaging,
was performed.

[Series 4: chest w/o 5mm st · axial · non-contrast · 0.72mm/px · z∈[+1049,+1294]mm · 8 of 65 slices shown, 10 images]
[im 8/65  mediastinal]
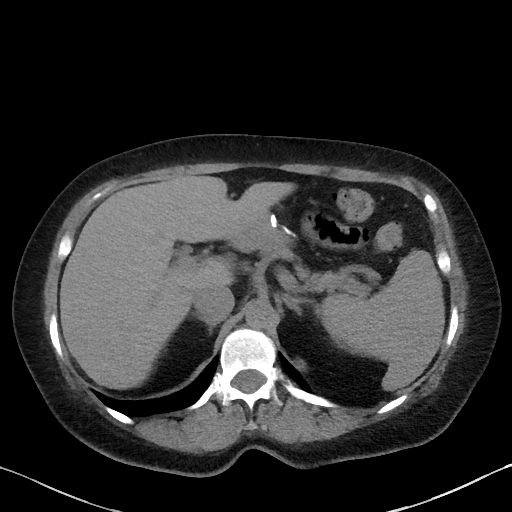
[im 8/65  lung]
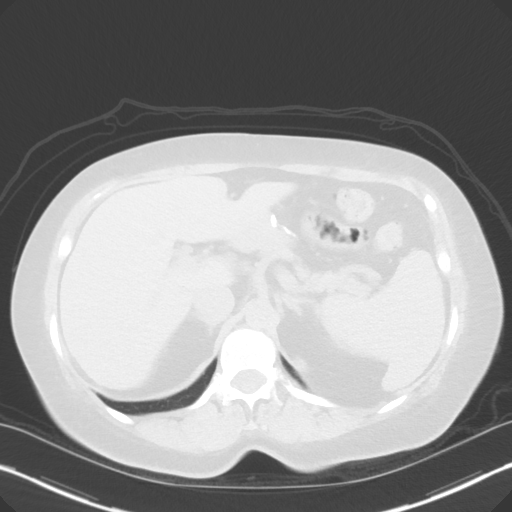
[im 15/65  lung]
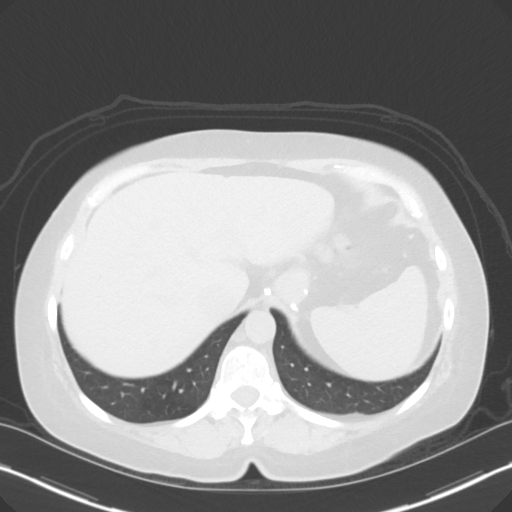
[im 22/65  lung]
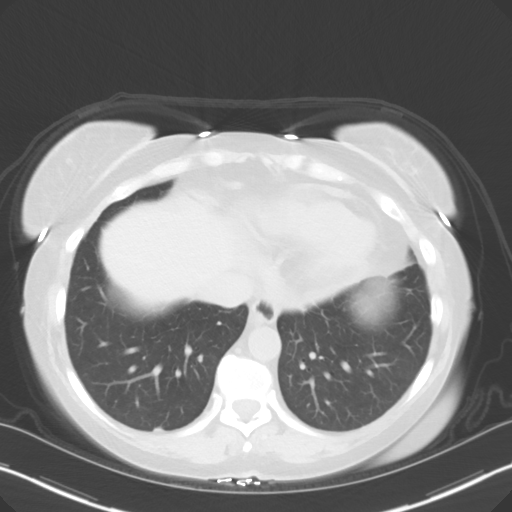
[im 29/65  lung]
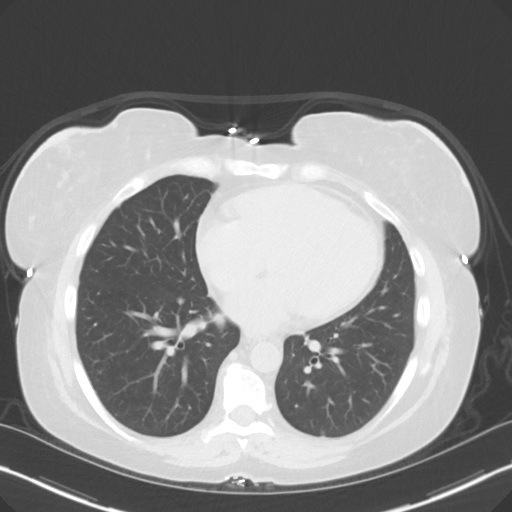
[im 36/65  mediastinal]
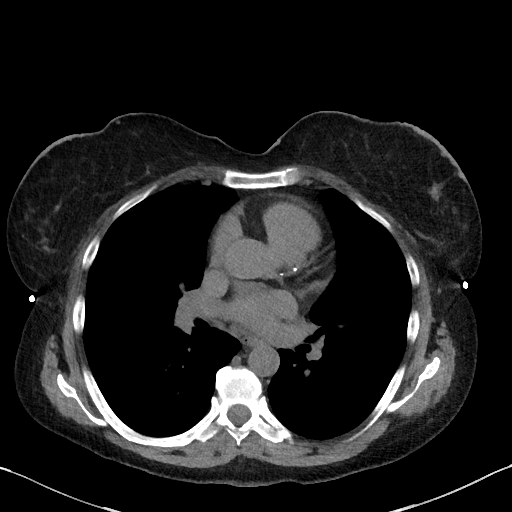
[im 36/65  lung]
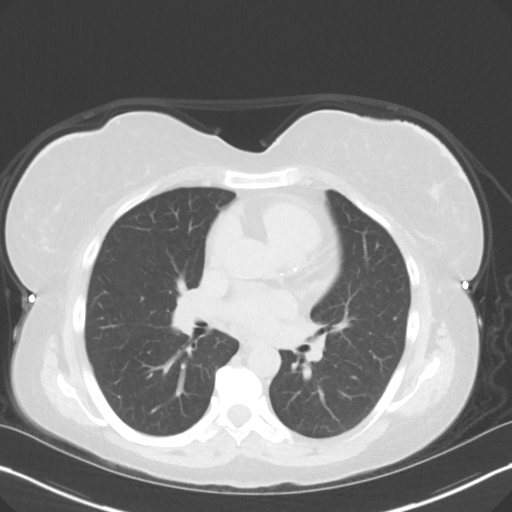
[im 43/65  lung]
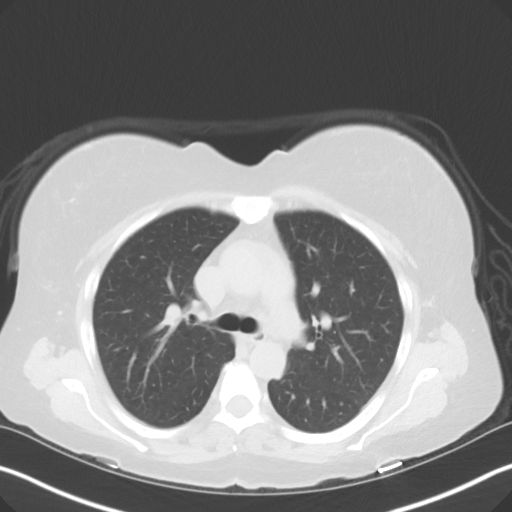
[im 50/65  lung]
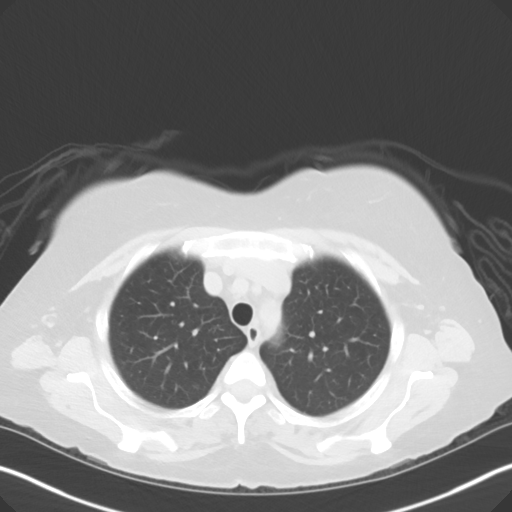
[im 57/65  lung]
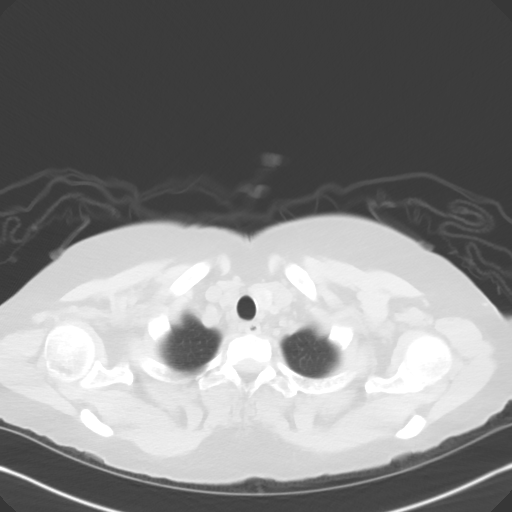

[Series 6: chest w/o 3mm st cor · coronal · non-contrast · 0.63mm/px · 3 of 80 slices shown]
[im 16/80  lung]
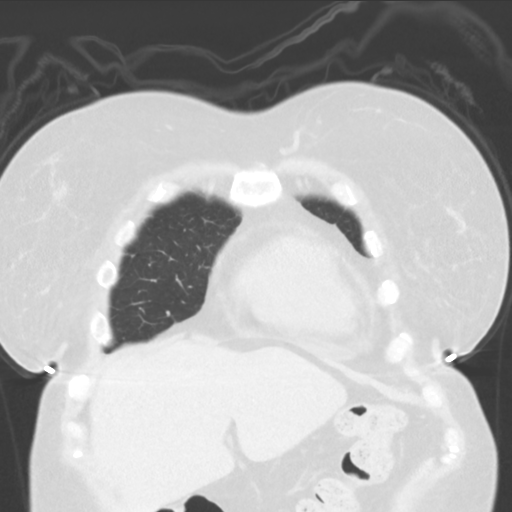
[im 32/80  lung]
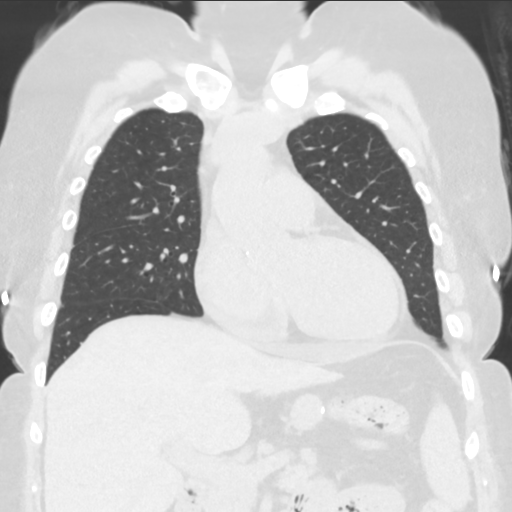
[im 48/80  lung]
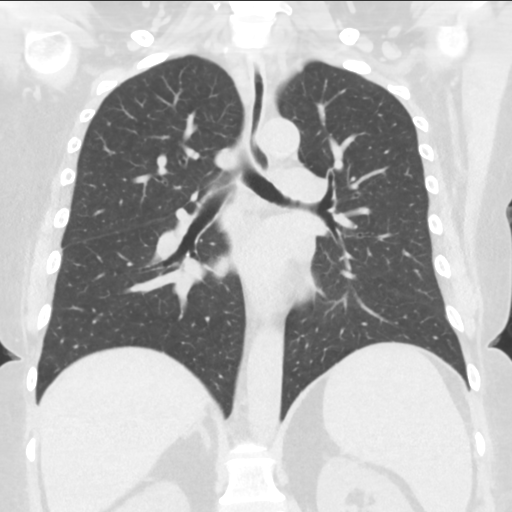

[11 of 36 positions shown; findings below may reference images not displayed]

FINDINGS: Mediastinum/Nodes: Normal heart size. Mild pericardial
fluid/thickening. Left anterior descending coronary atherosclerosis.
Atherosclerotic nonaneurysmal thoracic aorta. Dilated main pulmonary
artery (3.1 cm diameter). Mildly heterogeneous thyroid parenchymal
attenuation, without discrete thyroid nodule. Normal esophagus. No
pathologically enlarged axillary, mediastinal or gross hilar lymph
nodes, noting limited sensitivity for the detection of hilar
adenopathy on this noncontrast study.

Lungs/Pleura: No pneumothorax. No pleural effusion. Apical right
upper lobe 3 mm solid pulmonary nodule (series 5/ image 12), stable
since 11/26/2014. Medial right upper lobe 3 mm solid subpleural
pulmonary nodule (series 5/ image 26), stable since 11/26/2014.
Subpleural 4 mm anterior right lower lobe pulmonary nodule
associated with the right major fissure (series 5/ image 34), stable
since 11/26/2014. Superior segment left lower lobe 3 mm solid
pulmonary nodule (series 5/ image 20), stable since 11/26/2014. No
acute consolidative airspace disease, additional significant
pulmonary nodules or lung masses. No significant regions of
subpleural reticulation, traction bronchiectasis, parenchymal
banding, architectural distortion or frank honeycombing. There is
faint coalescent ground-glass centrilobular nodularity throughout
both lungs. No significant air trapping on the expiration sequence.

Upper abdomen: Status post cholecystectomy. Postsurgical changes
from gastric sleeve surgery.

Musculoskeletal: No aggressive appearing focal osseous lesions. Mild
degenerative changes in the thoracic spine.
IMPRESSION: 1. Dilated main pulmonary artery, suggesting pulmonary arterial
hypertension.
2. Faint coalescent ground-glass centrilobular nodularity throughout
both lungs. Given the suspected pulmonary hypertension, this is
favored to be a manifestation of mild plexogenic pulmonary
arteriopathy. Hypersensitivity pneumonitis is another consideration,
although less likely given the absence of significant air trapping.
3. Otherwise no evidence of interstitial lung disease, specifically
no significant regions of subpleural reticulation, traction
bronchiectasis or frank honeycombing.
4. One vessel coronary atherosclerosis.
5. Mild pericardial fluid/thickening.

## 2017-03-25 ENCOUNTER — Other Ambulatory Visit (HOSPITAL_COMMUNITY): Payer: Self-pay | Admitting: Pharmacist

## 2017-03-25 MED ORDER — MACITENTAN 10 MG PO TABS: 10.0000 mg | ORAL_TABLET | Freq: Every day | ORAL | 3 refills | Status: DC

## 2017-04-02 ENCOUNTER — Encounter: Payer: Self-pay | Admitting: Skilled Nursing Facility1

## 2017-04-02 ENCOUNTER — Encounter: Payer: BLUE CROSS/BLUE SHIELD | Attending: Surgery | Admitting: Skilled Nursing Facility1

## 2017-04-02 DIAGNOSIS — Z6841 Body Mass Index (BMI) 40.0 and over, adult: Secondary | ICD-10-CM | POA: Insufficient documentation

## 2017-04-02 DIAGNOSIS — Z713 Dietary counseling and surveillance: Secondary | ICD-10-CM | POA: Insufficient documentation

## 2017-04-02 NOTE — Progress Notes (Signed)
Follow-up visit:  1 year Post-Operative Sleeve gastrectomy Surgery  Medical Nutrition Therapy:  Appt start time: 925 end time: 1015  Primary concerns today: Post-operative Bariatric Surgery Nutrition Management.  Pt was remorseful she has "backslid" and has not seen the dietitian in a few months. Pt states her mother passed away.  Pt states she has scleroderma which affects the lining of her lungs causing shortens her breath. Pt states she is doing a 5k this Friday. Pt states she has an appointment with her eye doctor next month stating she has blurry vision in the afternoon. Pt states she has not been checking her blood sugar (did not have diabetes). Pt states she has been more tired, needing naps, in the last few months. Pt Checked her blood sugar in the appointment and got 70. Pt states her husband is starting a new shift at work which will enable her to work out.   Surgery date: 07/26/2015 Surgery type: Sleeve Gastrectomy Start weight at [facility]: 251 lbs on 03/26/15 Weight today: 196 lbs Weight change: 8.6 lbs gain   Goal weight: 150 lbs   TANITA  BODY COMP RESULTS  07/11/15 08/05/15 09/19/15 11/01/15 12/28/15 03/01/16 05/02/16 07/18/16 10/18/16 04/02/2017   BMI (kg/m^2) 42.6 38.3 35.9 34.9 32.5 32.2 N/A 32.6 33.1 35.1   Fat Mass (lbs) 107 102.5 80.5 67.5 61 56.5  56.2 56.6 70.8   Fat Free Mass (lbs) 145 124 132 136 131.5 134  136.8 139.4 133.8   Total Body Water (lbs) 106 91 96.5 99.5 96.5 98  97.6 99.4 95.8    Preferred Learning Style:   No preference indicated   Learning Readiness:  Ready  24-hr recall: B (AM): 1/2 protein shake, vitamins (15g) Snk (9-9:30 AM): [employer] protein bar Snk: sometimes Belvita crackers or pretzels L (11:45 AM): catered lunch: sandwiches or quiche with fruit or tacos or meatloaf with green beans Snk (2:30-3 PM): cashews Snk: sometimes more Belvita  D (PM): stir fry chicken and vegetables (14-21g) Snk (PM): chocolate  Fluid intake:  5.5 oz protein  shake, hot tea, 40 ounces, sometimes sweet tea:50 fluid ounces  Estimated total protein intake: 60+ g  Medications: resumed taking Nexium; has not started Lasix back, taking probiotic Supplementation: taking, taking gummy Calcium  Drinking while eating: no Hair loss: hair loss has stopped Carbonated beverages: none N/V/D/C: diarrhea Dumping syndrome: none   Recent physical activity: [id number]  Progress Towards Goal(s):  In progress.   Nutritional Diagnosis:  [place]-3.3 Overweight/obesity related to past poor dietary habits and physical inactivity as evidenced by patient w/ recent sleeve gastrectomy surgery following dietary guidelines for continued weight loss.  Intervention:  Nutrition counseling provided. Goals: -When your eyes are blurry check you sugar to see if it is sugar related -Instead of sweet tea: un sweet tea with alternative sweetners  -For a chocolate craving: Flips greek yogurt OR Fairlife chocolate milk OR chocolate muscle milk-premade OR Peanut butter powder with chocloate protein shake in blender OR sugar free jello OR sugar free popcicle OR sugar free pudding mix made with regular fairlife milk -Aim to drink 2 of your cups at work and 1 at home of water  -To keep from being bored: Zumba, water aerobics, personal training, getting back into the gym...Marland KitchenMarland KitchenMarland Kitchen  Teaching Method Utilized:  Visual Auditory Hands on  Barriers to learning/adherence to lifestyle change: taste aversions  Demonstrated degree of understanding via:  Teach Back   Monitoring/Evaluation:  Dietary intake, exercise, and body weight. Follow up in 4 months for  1.5 year post-op visit.

## 2017-04-02 NOTE — Patient Instructions (Addendum)
-  When your eyes are blurry check you sugar to see if it is sugar related  -Instead of sweet tea: un sweet tea with alternative sweetners   -For a chocolate craving: Flips greek yogurt OR Fairlife chocolate milk OR chocolate muscle milk-premade OR Peanut butter powder with chocloate protein shake in blender OR sugar free jello OR sugar free popcicle OR sugar free pudding mix made with regular fairlife milk  -Aim to drink 2 of your cups at work and 1 at home of water   -To keep from being bored: Zumba, water aerobics, personal training, getting back into the gym....Marland KitchenMarland Kitchen

## 2017-04-04 ENCOUNTER — Telehealth (HOSPITAL_COMMUNITY): Payer: Self-pay | Admitting: Pharmacist

## 2017-04-04 NOTE — Telephone Encounter (Signed)
Opsumit PA approved by [facility] [employer] through 03/29/21.   Tyler Deis. Bonnye Fava, PharmD, BCPS, CPP Clinical Pharmacist Pager: [phone] Phone: [phone] 04/04/2017 11:40 AM

## 2017-04-17 ENCOUNTER — Telehealth: Payer: Self-pay | Admitting: Pulmonary Disease

## 2017-04-17 NOTE — Telephone Encounter (Signed)
Patient was told by Fayrene Fearing to call us to schedule appt with RA - scheduled pt w/ RA on 04/23/2017- Pt can be reached at [phone] -pr

## 2017-04-17 NOTE — Telephone Encounter (Signed)
I called & spoke to [facility] with [employer].  He has been having issues in his office & he is going to call the pt & speak to her and will call me back.  Also, he checked & pt signed with them again in 2016 so she will probably not be able to find anyone else to accept her.  Waiting for Fayrene Fearing to call me back.

## 2017-04-18 ENCOUNTER — Encounter: Payer: Self-pay | Admitting: *Deleted

## 2017-04-18 ENCOUNTER — Encounter: Payer: Self-pay | Admitting: Pulmonary Disease

## 2017-04-18 NOTE — Telephone Encounter (Signed)
I talked to Fayrene Fearing at [employer] he has spoken to pt.  He has her scheduled for her ONO and told her to call us for appt.  I see that pt has called.  Nothing further is needed.

## 2017-04-18 NOTE — Progress Notes (Signed)
OPUS REGISTRY Informed Consent   Subject Name: Rachael Camacho   Subject met inclusion and exclusion criteria.  The informed consent form, study requirements and expectations were reviewed with the subject and questions and concerns were addressed prior to the signing of the consent form.  The subject verbalized understanding of the trial requirements.  The subject agreed to participate in the [place] and signed the informed consent.  The informed consent was obtained prior to performance of any protocol-specific procedures for the subject.  A copy of the signed informed consent was given to the subject and a copy was placed in the subject's medical record.  [name] 04/18/2017 11:55

## 2017-04-23 ENCOUNTER — Encounter: Payer: Self-pay | Admitting: Pulmonary Disease

## 2017-04-23 ENCOUNTER — Ambulatory Visit (INDEPENDENT_AMBULATORY_CARE_PROVIDER_SITE_OTHER): Payer: BLUE CROSS/BLUE SHIELD | Admitting: Pulmonary Disease

## 2017-04-23 DIAGNOSIS — I2721 Secondary pulmonary arterial hypertension: Secondary | ICD-10-CM

## 2017-04-23 NOTE — Patient Instructions (Signed)
Okay to discontinue oxygen Referral to pulmonary rehabilitation Try to get back on Lasix at least 3 times a week

## 2017-04-23 NOTE — Progress Notes (Signed)
   Subjective:    Patient ID: Rachael Camacho, female    DOB: 04-Sep-1966, 51 y.o.   MRN: [id number]  HPI  51 year old never smoker with scleroderma for FU of Nocturnal hypoxia and pulmonary hypertension  Seen in the past for raynauds', arthritis CPAP was dc'd 08/2015 after HST  02/07/2017      Chief Complaint  Patient presents with  . Follow-up    Wants another PFT to compare to last PFT. Denies any breathing issues.    She has follow-up with rheumatology and cardiology. She lost weight with bariatric surgery 07/2015 down to 185 pounds but  gained back up to 206 pounds, Admits to being more sedentary during winter  Nocturnal oximetry showed minimal desaturation, less than 20 minutes  HRCT showed mil centrilobular groundglass and dilated main pulmonary artery ,  She underwent esophageal dilatation by GI - [facility] She admits to poor compliance with Lasix due to her work schedule. PFTs 01/2017  showed lung volumes preserved, DLCO stable at 60%  Repeat right heart cath showed PA pressure in the 60s, PVR increased to 5.3 WU  Significant tests/ events  01/2015 saturation about 93%-on walking she desaturate to 84%  Home sleep study 02/2015>>AHI 10.6, SaO2 low 70%. She spent 329.7 min (84.7% of test time) with SaO2 <90%. Consistent with mild sleep apnea   Right heart cath >>02/2015 -right atrial pressure 10, pulmonary capillary wedge pressure 14 and PA pressure 50/19. PVR calculates to 2.7 WU (normal range). PA pressures are up due to high output and pulmonary vasodilators not indicated.    HST 08/2015 >>low AHI, desatn >stay on 2L O2  RHC 11/2015 PVR 2.8 Wu PFT 11/2015 DLCO 58%   Review of Systems neg for any significant sore throat, dysphagia, itching, sneezing, nasal congestion or excess/ purulent secretions, fever, chills, sweats, unintended wt loss, pleuritic or exertional cp, hempoptysis, orthopnea pnd or change in chronic leg swelling. Also denies  presyncope, palpitations, heartburn, abdominal pain, nausea, vomiting, diarrhea or change in bowel or urinary habits, dysuria,hematuria, rash, arthralgias, visual complaints, headache, numbness weakness or ataxia.     Objective:   Physical Exam  Gen. Pleasant, obese, in no distress ENT - no lesions, no post nasal drip Neck: No JVD, no thyromegaly, no carotid bruits Lungs: no use of accessory muscles, no dullness to percussion, decreased without rales or rhonchi  Cardiovascular: Rhythm regular, heart sounds  normal, no murmurs or gallops, no peripheral edema Musculoskeletal: No deformities, no cyanosis or clubbing , no tremors       Assessment & Plan:

## 2017-04-23 NOTE — Assessment & Plan Note (Signed)
Okay to discontinue oxygen Referral to pulmonary rehabilitation Try to get back on Lasix at least 3 times a week Tolerating macitentan , may have to add PDE inhibitor  She will follow-up in one year for repeat DLCO testing

## 2017-04-29 ENCOUNTER — Telehealth: Payer: Self-pay | Admitting: Pulmonary Disease

## 2017-04-29 DIAGNOSIS — G4733 Obstructive sleep apnea (adult) (pediatric): Secondary | ICD-10-CM

## 2017-04-29 NOTE — Telephone Encounter (Signed)
Okay to discontinue oxygen

## 2017-04-29 NOTE — Telephone Encounter (Signed)
Order has been placed to d/c pt's night time O2. Misty StanleyLisa with [employer] is aware. Nothing further needed.

## 2017-04-29 NOTE — Telephone Encounter (Signed)
Spoke with Misty StanleyLisa with [employer], who states pt reached out to them in regards to d/c her oxygen. [facility] has not received an order to d/c pt's nighttime oxygen. Per pt's instructions on 04/23/17 RA states okay to discontinue oxygen.  RA please advise if okay to place an order to d/c O2. Thanks.

## 2017-05-27 NOTE — Addendum Note (Signed)
Addendum  created 05/27/17 1119 by Jabriel Vanduyne, MD   Sign clinical note    

## 2017-06-05 ENCOUNTER — Other Ambulatory Visit (HOSPITAL_COMMUNITY): Payer: Self-pay | Admitting: Obstetrics and Gynecology

## 2017-06-05 DIAGNOSIS — E041 Nontoxic single thyroid nodule: Secondary | ICD-10-CM

## 2017-06-10 ENCOUNTER — Ambulatory Visit: Payer: BLUE CROSS/BLUE SHIELD | Admitting: Skilled Nursing Facility1

## 2017-06-11 ENCOUNTER — Ambulatory Visit (HOSPITAL_COMMUNITY)
Admission: RE | Admit: 2017-06-11 | Discharge: 2017-06-11 | Disposition: A | Discharge status: Home / Self Care | Payer: BLUE CROSS/BLUE SHIELD | Source: Ambulatory Visit | Attending: Obstetrics and Gynecology | Admitting: Obstetrics and Gynecology

## 2017-06-11 DIAGNOSIS — E041 Nontoxic single thyroid nodule: Secondary | ICD-10-CM | POA: Diagnosis present

## 2017-06-11 DIAGNOSIS — E042 Nontoxic multinodular goiter: Secondary | ICD-10-CM | POA: Insufficient documentation

## 2017-06-21 ENCOUNTER — Other Ambulatory Visit: Payer: Self-pay | Admitting: Obstetrics and Gynecology

## 2017-06-21 DIAGNOSIS — E041 Nontoxic single thyroid nodule: Secondary | ICD-10-CM

## 2017-07-02 ENCOUNTER — Other Ambulatory Visit (HOSPITAL_COMMUNITY)
Admission: RE | Admit: 2017-07-02 | Discharge: 2017-07-02 | Disposition: A | Discharge status: Home / Self Care | Payer: BLUE CROSS/BLUE SHIELD | Source: Ambulatory Visit | Attending: Physician Assistant | Admitting: Physician Assistant

## 2017-07-02 ENCOUNTER — Ambulatory Visit
Admission: RE | Admit: 2017-07-02 | Discharge: 2017-07-02 | Disposition: A | Discharge status: Home / Self Care | Payer: BLUE CROSS/BLUE SHIELD | Source: Ambulatory Visit | Attending: Obstetrics and Gynecology | Admitting: Obstetrics and Gynecology

## 2017-07-02 DIAGNOSIS — E041 Nontoxic single thyroid nodule: Secondary | ICD-10-CM | POA: Diagnosis not present

## 2017-07-02 NOTE — Procedures (Signed)
PROCEDURE SUMMARY:  Using direct ultrasound guidance, 4 passes were made using 25 g needles into the nodule within the left lobe of the thyroid.   Ultrasound was used to confirm needle placements on all occasions.   Specimens were sent to Pathology for analysis.  Jerry CarasWENDY S Mieczyslaw Stamas PA-C 07/02/2017 4:35 PM

## 2017-07-03 ENCOUNTER — Other Ambulatory Visit: Payer: Self-pay | Admitting: Internal Medicine

## 2017-07-03 ENCOUNTER — Encounter (INDEPENDENT_AMBULATORY_CARE_PROVIDER_SITE_OTHER): Payer: BLUE CROSS/BLUE SHIELD | Admitting: Family Medicine

## 2017-07-10 ENCOUNTER — Ambulatory Visit: Payer: BLUE CROSS/BLUE SHIELD | Admitting: Skilled Nursing Facility1

## 2017-07-16 ENCOUNTER — Other Ambulatory Visit: Payer: Self-pay | Admitting: Endocrinology

## 2017-07-16 DIAGNOSIS — E049 Nontoxic goiter, unspecified: Secondary | ICD-10-CM

## 2017-07-22 ENCOUNTER — Other Ambulatory Visit (HOSPITAL_COMMUNITY): Payer: Self-pay | Admitting: Pharmacist

## 2017-07-22 MED ORDER — MACITENTAN 10 MG PO TABS: 10.0000 mg | ORAL_TABLET | Freq: Every day | ORAL | 3 refills | Status: DC

## 2017-09-03 ENCOUNTER — Encounter (INDEPENDENT_AMBULATORY_CARE_PROVIDER_SITE_OTHER): Payer: BLUE CROSS/BLUE SHIELD

## 2017-09-10 ENCOUNTER — Encounter (INDEPENDENT_AMBULATORY_CARE_PROVIDER_SITE_OTHER): Payer: Self-pay | Admitting: Family Medicine

## 2017-09-10 ENCOUNTER — Ambulatory Visit (INDEPENDENT_AMBULATORY_CARE_PROVIDER_SITE_OTHER): Payer: BLUE CROSS/BLUE SHIELD | Admitting: Family Medicine

## 2017-09-10 VITALS — BP 104/73 | HR 83 | Temp 97.7°F | Ht 64.0 in | Wt 207.0 lb

## 2017-09-10 DIAGNOSIS — R5383 Other fatigue: Secondary | ICD-10-CM | POA: Diagnosis not present

## 2017-09-10 DIAGNOSIS — Z0289 Encounter for other administrative examinations: Secondary | ICD-10-CM

## 2017-09-10 DIAGNOSIS — IMO0001 Reserved for inherently not codable concepts without codable children: Secondary | ICD-10-CM

## 2017-09-10 DIAGNOSIS — M341 CR(E)ST syndrome: Secondary | ICD-10-CM | POA: Diagnosis not present

## 2017-09-10 DIAGNOSIS — Z1389 Encounter for screening for other disorder: Secondary | ICD-10-CM | POA: Diagnosis not present

## 2017-09-10 DIAGNOSIS — E669 Obesity, unspecified: Secondary | ICD-10-CM

## 2017-09-10 DIAGNOSIS — Z1331 Encounter for screening for depression: Secondary | ICD-10-CM

## 2017-09-10 DIAGNOSIS — Z6835 Body mass index (BMI) 35.0-35.9, adult: Secondary | ICD-10-CM

## 2017-09-10 DIAGNOSIS — R0602 Shortness of breath: Secondary | ICD-10-CM

## 2017-09-10 NOTE — Progress Notes (Signed)
Office: [phone]  /  Fax: [phone]   Dear Daphine Deutscher,   Thank you for referring Rachael Camacho to our clinic. The following note includes my evaluation and treatment recommendations.  HPI:   Chief Complaint: OBESITY    Rachael Camacho has been referred by [place]. Daphine Deutscher, MD for consultation regarding her obesity and obesity related comorbidities.    Rachael Camacho (MR# [id number]) is a 51 y.o. female who presents on 09/10/2017 for obesity evaluation and treatment. Current BMI is Body mass index is 35.53 kg/m.[name] Rachael Camacho had Gastric Sleeve in 2016. Her heaviest preoperative weight was 250 lbs and she lost down to 184 lbs within 7 months. Rachael Camacho kept this weight off for about 5 months before starting to regain weight and has now regained 23 lbs from their lowest postoperative weight.     Rachael Camacho attended our information session and states she is currently in the action stage of change and ready to dedicate time achieving and maintaining a healthier weight. Rachael Camacho requests to join our Back-On-Track program to help manage their weight and relearn how to use their weight loss surgery to achieve improved health.    Rachael Camacho states her family eats meals together she struggles with family and or coworkers weight loss sabotage her desired weight loss is 35 to 50 lbs she has been heavy most of  her life she started gaining weight after marriage/1st child her heaviest weight ever was 265 lbs. she has significant food cravings issues  she snacks frequently in the evenings she is frequently drinking liquids with calories she frequently makes poor food choices she has problems with excessive hunger  she frequently eats larger portions than normal  she has binge eating behaviors she struggles with emotional eating    Rachael Camacho feels her energy is lower than it should be. This has worsened with weight gain and has not worsened recently. Rachael Camacho admits to daytime somnolence and  admits to  waking up still tired. Rachael Camacho is at risk for obstructive sleep apnea. Patent has a history of symptoms of daytime Rachael and morning Rachael. Rachael Camacho generally gets 6 or 7 hours of sleep per night, and states they generally have restless sleep. Snoring is present. Apneic episodes are not present. Epworth Sleepiness Score is 13  Dyspnea on exertion Rachael Camacho notes increasing shortness of breath with exercising and seems to be worsening over time with weight gain. She notes getting out of breath sooner with activity than she used to. This has not gotten worse recently. Saanya denies orthopnea.  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 She is followed by Rheumatology. Her symptoms worsened with weight gain.  Depression Screen Rachael Camacho (modified PHQ-9) score was  Depression screen PHQ 2/9 09/10/2017  Decreased Interest 2  Down, Depressed, Hopeless 2  PHQ - 2 Score 4  Altered sleeping 1  Tired, decreased energy 3  Change in appetite 1  Feeling bad or failure about yourself  3  Trouble concentrating 2  Moving slowly or fidgety/restless 2  Suicidal thoughts 0  PHQ-9 Score 16  Difficult doing work/chores Somewhat difficult    ALLERGIES: Allergies  Allergen Reactions  . Amoxicillin Other (See Comments)    Immediate yeast infection  Has Rachael Camacho had a PCN reaction causing immediate rash, facial/tongue/throat swelling, SOB or lightheadedness with hypotension: No Has Rachael Camacho had a PCN reaction causing severe rash involving mucus membranes or skin necrosis: No Has Rachael Camacho had a PCN  reaction that required hospitalization: No Has Rachael Camacho had a PCN reaction occurring within the last 10 years: No If all of the above answers are "NO", then may proceed with Cephalosporin use.   [name] Percocet [Oxycodone-Acetaminophen] Itching    Tolerates acetaminophen alone  . [name] [Nitrofurantoin Monohyd Macro] Rash and Hives  .  [name] Hives and Rash    Reported chicken pox-like rash, not hives  . Sulfa Antibiotics Rash and Hives    MEDICATIONS: Current Outpatient Prescriptions on File Prior to Visit  Medication Sig Dispense Refill  . B-12, Methylcobalamin, 1000 MCG SUBL Place 1,000 mcg under the tongue daily.     [name] BIOTIN PO Take 1 tablet by mouth daily.    . Ca Phosphate-Cholecalciferol (EQL CALCIUM GUMMIES PO) Take 2 each by mouth daily at 12 noon.     . desloratadine (CLARINEX) 5 MG tablet Take 5 mg by mouth at bedtime as needed (allergies.).   12  . fluticasone (FLONASE) 50 MCG/ACT nasal spray Place 1 spray into both nostrils at bedtime as needed for allergies or rhinitis.     . furosemide (LASIX) 20 MG tablet TAKE 1 TABLET (20 MG TOTAL) BY MOUTH DAILY. 90 tablet 3  . Macitentan (OPSUMIT) 10 MG TABS Take 1 tablet (10 mg total) by mouth daily. 90 tablet 3  . Magnesium 400 MG TABS Take 400 mg by mouth daily at 12 noon.     . Multiple Vitamins-Minerals (MULTIVITAMIN WITH MINERALS) tablet Take 1 tablet by mouth daily.     . Probiotic Product (4X PROBIOTIC) TABS Take 1 tablet by mouth daily.    . furosemide (LASIX) 20 MG tablet Take 20 mg by mouth daily as needed for fluid.    [name] oxymetazoline (AFRIN) 0.05 % nasal spray Place 1 spray into both nostrils 2 (two) times daily as needed for congestion.     No current facility-administered medications on file prior to visit.     PAST MEDICAL HISTORY: Past Medical History:  Diagnosis Date  . Anemia    as child in first grade  . Anxiety   . Arthritis   . Back pain   . Constipation   . CREST syndrome (HCC)   . Dyspnea   . Family history of adverse reaction to anesthesia    mom has had problems in past  . Gallbladder problem   . GERD (gastroesophageal reflux disease)   . Gestational diabetes mellitus in childbirth, diet controlled 1987  . IBS (irritable bowel syndrome)   . Joint pain   . Leg edema   . Menopause   . Osteoarthritis (arthritis due to wear  and tear of joints)   . PAH (pulmonary artery hypertension) (HCC)   . Pulmonary fibrosis (HCC)   . Raynaud disease    hands-toes  . Scleroderma (HCC)   . Shortness of breath dyspnea    pulmonary fibrosis-scleraderma  . Sleep apnea   . Swallowing difficulty   . Thyroid cyst   . Vitamin D deficiency   . Wears glasses     PAST SURGICAL HISTORY: Past Surgical History:  Procedure Laterality Date  . CARDIAC CATHETERIZATION N/A 11/25/2015   Procedure: Right Heart Cath;  Surgeon: Dolores Patty, MD;  Location: [facility] INVASIVE CV LAB;  Service: Cardiovascular;  Laterality: N/A;  . CESAREAN SECTION  87,95  . CHOLECYSTECTOMY  1996   lap choli  . ESOPHAGOGASTRODUODENOSCOPY (EGD) WITH PROPOFOL N/A 02/12/2017   Procedure: ESOPHAGOGASTRODUODENOSCOPY (EGD) WITH PROPOFOL;  Surgeon: Vida Rigger, MD;  Location: [facility] ENDOS[facility];  Service: Endoscopy;  Laterality: N/A;  . HIATAL HERNIA REPAIR  07/26/2015   Procedure: LAPAROSCOPIC REPAIR OF HIATAL HERNIA;  Surgeon: Luretha Murphy, MD;  Location: [facility] ORS;  Service: General;;  . LAPAROSCOPIC GASTRIC SLEEVE RESECTION N/A 07/26/2015   Procedure: LAPAROSCOPIC GASTRIC SLEEVE RESECTION;  Surgeon: Luretha Murphy, MD;  Location: [facility];  Service: General;  Laterality: N/A;  . LEFT AND RIGHT HEART CATHETERIZATION WITH CORONARY ANGIOGRAM N/A 03/14/2015   Procedure: LEFT AND RIGHT HEART CATHETERIZATION WITH CORONARY ANGIOGRAM;  Surgeon: Peter M Swaziland, MD;  Location: [facility] CATH LAB;  Service: Cardiovascular;  Laterality: N/A;  . RADIAL HEAD ARTHROPLASTY Right 07/28/2014   Procedure: RIGHT RADIAL HEAD REPLACEMENT;  Surgeon: Dairl Ponder, MD;  Location: [facility];  Service: Orthopedics;  Laterality: Right;  . RIGHT HEART CATH N/A 03/18/2017   Procedure: Right Heart Cath;  Surgeon: Dolores Patty, MD;  Location: [facility] INVASIVE CV LAB;  Service: Cardiovascular;  Laterality: N/A;  . SAVORY DILATION N/A 02/12/2017   Procedure: SAVORY DILATION;  Surgeon: Vida Rigger, MD;   Location: [facility];  Service: Endoscopy;  Laterality: N/A;  . TUBAL LIGATION    . UPPER GI ENDOSCOPY  07/26/2015   Procedure: UPPER GI ENDOSCOPY;  Surgeon: Luretha Murphy, MD;  Location: [facility];  Service: General;;  . VAGINAL HYSTERECTOMY  2010   [facility]    SOCIAL HISTORY: Social History  Substance Use Topics  . Smoking status: Never Smoker  . Smokeless tobacco: Never Used  . Alcohol use 0.0 oz/week     Comment: occ    FAMILY HISTORY: Family History  Problem Relation Age of Onset  . Heart failure Mother        Stent put in 2014 for blockage  . COPD Mother   . Hypertension Mother   . Hyperlipidemia Mother   . Heart disease Mother   . Thyroid disease Mother   . Cancer Mother   . Depression Mother   . Anxiety disorder Mother   . Heart disease Father   . Heart disease Son        Repair ASD    ROS: Review of Systems  Constitutional: Positive for malaise/Rachael.  HENT: Positive for congestion (nasal stuffiness) and ear pain.        Difficult or Painful Swallowing Dry Mouth Mouth Sores  Eyes:       Vision Changes Wear Glasses or Contacts Blurry or Double Vision  Respiratory: Positive for shortness of breath (with activity).   Cardiovascular: Negative for orthopnea.       Leg Cramping Very Cold Hands or Feet  Gastrointestinal: Positive for constipation, diarrhea and heartburn.       Swallowing Difficulty   Musculoskeletal:       Swollen Glands (Neck) Muscle or Joint Pain  Skin:       Dryness   Endo/Heme/Allergies: Positive for polydipsia.       Heat or Cold Intolerance     PHYSICAL EXAM: Blood pressure 104/73, pulse 83, temperature 97.7 F (36.5 C), temperature source Oral, height  (1.626 m), weight 207 lb (93.9 kg), SpO2 96 %. Body mass index is 35.53 kg/m. Physical Exam  Constitutional: She is oriented to person, place, and time. She appears well-developed and well-nourished.  Cardiovascular: Normal rate.   Pulmonary/Chest: Effort normal.    Musculoskeletal: Normal range of motion.  Neurological: She is oriented to person, place, and time.  Skin: Skin is warm and dry.  Psychiatric: She has a normal Camacho and affect. Her behavior  is normal.  Vitals reviewed.   RECENT LABS AND TESTS: BMET    Component Value Date/Time   NA 142 03/13/2017 1453   K 3.8 03/13/2017 1453   CL 109 03/13/2017 1453   CO2 27 03/13/2017 1453   GLUCOSE 106 (H) 03/13/2017 1453   BUN 10 03/13/2017 1453   CREATININE 0.75 03/13/2017 1453   CREATININE 0.75 03/08/2015 1501   CALCIUM 9.3 03/13/2017 1453   GFRNONAA >60 03/13/2017 1453   GFRNONAA >89 03/08/2015 1501   GFRAA >60 03/13/2017 1453   GFRAA >89 03/08/2015 1501   No results found for: HGBA1C No results found for: INSULIN CBC    Component Value Date/Time   WBC 7.3 03/13/2017 1453   RBC 4.55 03/13/2017 1453   HGB 13.7 03/13/2017 1453   HCT 40.9 03/13/2017 1453   PLT 235 03/13/2017 1453   MCV 89.9 03/13/2017 1453   MCH 30.1 03/13/2017 1453   MCHC 33.5 03/13/2017 1453   RDW 13.0 03/13/2017 1453   LYMPHSABS 1.8 07/28/2015 0455   MONOABS 0.9 07/28/2015 0455   EOSABS 0.1 07/28/2015 0455   BASOSABS 0.0 07/28/2015 0455   Iron/TIBC/Ferritin/ %Sat No results found for: IRON, TIBC, FERRITIN, IRONPCTSAT Lipid Panel  No results found for: CHOL, TRIG, HDL, CHOLHDL, VLDL, LDLCALC, LDLDIRECT Hepatic Function Panel     Component Value Date/Time   PROT 7.3 07/21/2015 0900   ALBUMIN 4.2 07/21/2015 0900   AST 25 07/21/2015 0900   ALT 26 07/21/2015 0900   ALKPHOS 74 07/21/2015 0900   BILITOT 0.6 07/21/2015 0900   No results found for: TSH  ECG  shows NSR with a rate of 70 BPM INDIRECT CALORIMETER done today shows a VO2 of 216 and a REE of 1503. Her calculated basal metabolic rate is 1610 thus her basal metabolic rate is better than expected.    ASSESSMENT AND PLAN: Other Rachael - Plan: EKG 12-Lead, Vitamin B12, CBC With Differential, Folate, Comprehensive metabolic panel, Hemoglobin  A1c, Insulin, random, VITAMIN D 25 Hydroxy (Vit-D Deficiency, Fractures), TSH, T4, free, T3, Lipid Panel With LDL/HDL Ratio  Shortness of breath on exertion  CREST syndrome (HCC)  Depression screening  Class 2 obesity with serious comorbidity and body mass index (BMI) of 35.0 to 35.9 in adult, unspecified obesity type  PLAN: Rachael Rachael Camacho was informed that her Rachael may be related to obesity, depression or many other causes. Labs will be ordered, and in the meanwhile Rachael Camacho has agreed to work on diet, exercise and weight loss to help with Rachael. Proper sleep hygiene was discussed including the need for 7-8 hours of quality sleep each night. A sleep study was not ordered based on symptoms and Epworth score.  Dyspnea on exertion Aneri's shortness of breath appears to be obesity related and exercise induced. She has agreed to work on weight loss and gradually increase exercise to treat her exercise induced shortness of breath. If Rachael Camacho follows our instructions and loses weight without improvement of her shortness of breath, we will plan to refer to pulmonology. We will monitor this condition regularly. Rachael Camacho agrees to this plan.  CREST Syndrome  Rachael Camacho agrees to work on diet, exercise and weight loss. We will factor limitations into her exercise routine when it is time to start. Rachael Camacho agrees to follow up with our clinic in 1 week.  Depression Screen Rachael Camacho had a strongly positive depression screening. Depression is commonly associated with obesity and often results in emotional eating behaviors. We will monitor this closely and work on CBT  to help improve the non-hunger eating patterns. Referral to Psychology may be required if no improvement is seen as she continues in our clinic.  Obesity Kamori is currently in the action stage of change and her goal is to continue with weight loss efforts. I recommend Jeanette begin the structured treatment plan as follows:  She has agreed to keep a  food journal with 1100 to 1300 calories and 75+ grams of protein daily Mayling has been instructed to eventually work up to a goal of 150 minutes of combined cardio and strengthening exercise per week for weight loss and overall health benefits. We discussed the following Behavioral Modification Strategies today: keep a strict food journal, increasing lean protein intake, decreasing simple carbohydrates and work on meal planning and easy cooking plans  Amylah has agreed to join our [employer] and follow up with our clinic in 1 week. She was informed of the importance of frequent follow up visits to maximize her success with intensive lifestyle modifications for her multiple health conditions. She was informed we would discuss her lab results at her next visit unless there is a critical issue that needs to be addressed sooner. Arneta agreed to keep her next visit at the agreed upon time to discuss these results.  I, Nevada Crane, am acting as transcriptionist for Quillian Quince, MD  I have reviewed the above documentation for accuracy and completeness, and I agree with the above. -Quillian Quince, MD    OBESITY BEHAVIORAL INTERVENTION VISIT  Today's visit was # 1 out of 22.  Starting weight: 207 lbs Starting date: 09/10/17 Today's weight : 207 lbs Today's date: 09/10/2017 Total lbs lost to date: 0 (Patients must lose 7 lbs in the first 6 months to continue with counseling)   ASK: We discussed the diagnosis of obesity with Burley Saver today and Melvie agreed to give [place] permission to discuss obesity behavioral modification therapy today.  ASSESS: Jerrianne has the diagnosis of obesity and her BMI today is 35.51 Nikiesha is in the action stage of change   ADVISE: Ruchama was educated on the multiple health risks of obesity as well as the benefit of weight loss to improve her health. She was advised of the need for long term treatment and the importance of lifestyle  modifications.  AGREE: Multiple dietary modification options and treatment options were discussed and  Akina agreed to keep a food journal with 1100 to 1300 calories and 75+ grams of protein daily We discussed the following Behavioral Modification Strategies today: keep a strict food journal, increasing lean protein intake, decreasing simple carbohydrates and work on meal planning and easy cooking plans

## 2017-09-11 LAB — CBC WITH DIFFERENTIAL
BASOS: 0 %
Basophils Absolute: 0 10*3/uL (ref 0.0–0.2)
EOS (ABSOLUTE): 0.2 10*3/uL (ref 0.0–0.4)
EOS: 3 %
HEMATOCRIT: 41.6 % (ref 34.0–46.6)
HEMOGLOBIN: 13.9 g/dL (ref 11.1–15.9)
Immature Grans (Abs): 0 10*3/uL (ref 0.0–0.1)
Immature Granulocytes: 0 %
LYMPHS ABS: 2.3 10*3/uL (ref 0.7–3.1)
Lymphs: 34 %
MCH: 30.2 pg (ref 26.6–33.0)
MCHC: 33.4 g/dL (ref 31.5–35.7)
MCV: 90 fL (ref 79–97)
MONOS ABS: 0.5 10*3/uL (ref 0.1–0.9)
Monocytes: 8 %
NEUTROS ABS: 3.7 10*3/uL (ref 1.4–7.0)
Neutrophils: 55 %
RBC: 4.6 x10E6/uL (ref 3.77–5.28)
RDW: 14.1 % (ref 12.3–15.4)
WBC: 6.7 10*3/uL (ref 3.4–10.8)

## 2017-09-11 LAB — COMPREHENSIVE METABOLIC PANEL
ALBUMIN: 4.4 g/dL (ref 3.5–5.5)
ALK PHOS: 90 IU/L (ref 39–117)
ALT: 17 IU/L (ref 0–32)
AST: 23 IU/L (ref 0–40)
Albumin/Globulin Ratio: 2.1 (ref 1.2–2.2)
BILIRUBIN TOTAL: 0.5 mg/dL (ref 0.0–1.2)
BUN / CREAT RATIO: 22 (ref 9–23)
BUN: 14 mg/dL (ref 6–24)
CO2: 25 mmol/L (ref 20–29)
Calcium: 9.2 mg/dL (ref 8.7–10.2)
Chloride: 103 mmol/L (ref 96–106)
Creatinine, Ser: 0.65 mg/dL (ref 0.57–1.00)
GFR calc non Af Amer: 102 mL/min/{1.73_m2} (ref >59)
GFR, EST AFRICAN AMERICAN: 118 mL/min/{1.73_m2} (ref >59)
GLOBULIN, TOTAL: 2.1 g/dL (ref 1.5–4.5)
Glucose: 81 mg/dL (ref 65–99)
Potassium: 4.3 mmol/L (ref 3.5–5.2)
SODIUM: 142 mmol/L (ref 134–144)
TOTAL PROTEIN: 6.5 g/dL (ref 6.0–8.5)

## 2017-09-11 LAB — TSH: TSH: 2.69 u[IU]/mL (ref 0.450–4.500)

## 2017-09-11 LAB — VITAMIN B12: Vitamin B-12: >2000 pg/mL — ABNORMAL HIGH (ref 232–1245)

## 2017-09-11 LAB — HEMOGLOBIN A1C
Est. average glucose Bld gHb Est-mCnc: 97 mg/dL
HEMOGLOBIN A1C: 5 % (ref 4.8–5.6)

## 2017-09-11 LAB — T4, FREE: Free T4: 1.46 ng/dL (ref 0.82–1.77)

## 2017-09-11 LAB — LIPID PANEL WITH LDL/HDL RATIO
CHOLESTEROL TOTAL: 151 mg/dL (ref 100–199)
HDL: 70 mg/dL (ref >39)
LDL CALC: 61 mg/dL (ref 0–99)
LDl/HDL Ratio: 0.9 ratio (ref 0.0–3.2)
TRIGLYCERIDES: 102 mg/dL (ref 0–149)
VLDL Cholesterol Cal: 20 mg/dL (ref 5–40)

## 2017-09-11 LAB — FOLATE: Folate: >20 ng/mL (ref >3.0)

## 2017-09-11 LAB — INSULIN, RANDOM: INSULIN: 6.1 u[IU]/mL (ref 2.6–24.9)

## 2017-09-11 LAB — T3: T3 TOTAL: 123 ng/dL (ref 71–180)

## 2017-09-11 LAB — VITAMIN D 25 HYDROXY (VIT D DEFICIENCY, FRACTURES): VIT D 25 HYDROXY: 36.1 ng/mL (ref 30.0–100.0)

## 2017-09-17 ENCOUNTER — Ambulatory Visit (INDEPENDENT_AMBULATORY_CARE_PROVIDER_SITE_OTHER): Payer: BLUE CROSS/BLUE SHIELD | Admitting: Family Medicine

## 2017-09-17 ENCOUNTER — Encounter (INDEPENDENT_AMBULATORY_CARE_PROVIDER_SITE_OTHER): Payer: Self-pay | Admitting: Family Medicine

## 2017-09-17 VITALS — BP 99/67 | HR 77 | Temp 98.3°F | Ht 64.0 in | Wt 201.0 lb

## 2017-09-17 DIAGNOSIS — Z6834 Body mass index (BMI) 34.0-34.9, adult: Secondary | ICD-10-CM

## 2017-09-17 DIAGNOSIS — E559 Vitamin D deficiency, unspecified: Secondary | ICD-10-CM | POA: Insufficient documentation

## 2017-09-17 DIAGNOSIS — Z9884 Bariatric surgery status: Secondary | ICD-10-CM

## 2017-09-17 DIAGNOSIS — E669 Obesity, unspecified: Secondary | ICD-10-CM

## 2017-09-17 DIAGNOSIS — G4489 Other headache syndrome: Secondary | ICD-10-CM | POA: Diagnosis not present

## 2017-09-17 MED ORDER — VITAMIN D (ERGOCALCIFEROL) 1.25 MG (50000 UNIT) PO CAPS: 50000.0000 [IU] | ORAL_CAPSULE | ORAL | 1 refills | Status: DC

## 2017-09-17 NOTE — Progress Notes (Signed)
Office: [phone]  /  Fax: [phone]   HPI:   Chief Complaint: OBESITY Rachael Camacho is here to discuss her progress with her obesity treatment plan. She is on the  follow the Category 2 plan and is following her eating plan approximately 95 % of the time. She states she is exercising 0 minutes 0 times per week. 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 She did well trying to stay at her calorie goal. Hunger is controlled. Her weight is 201 lb (91.2 kg) today and has had a weight loss of 6 pounds over a period of 1 week since her last visit. She has lost 6 lbs since starting treatment with [place].  Vitamin D deficiency Rachael Camacho has a diagnosis of vitamin D deficiency. She is currently on a multi vitamin but vit D level is not yet at goal. She admits fatigue and denies nausea, vomiting or muscle weakness.  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 She has worse pain with wearing night guard. Pain improved with NSAID but she cannot take this regularly due to her weight loss surgery.  ALLERGIES: Allergies  Allergen Reactions   Amoxicillin Other (See Comments)    Immediate yeast infection  Has patient had a PCN reaction causing immediate rash, facial/tongue/throat swelling, SOB or lightheadedness with hypotension: No Has patient had a PCN reaction causing severe rash involving mucus membranes or skin necrosis: No Has patient had a PCN reaction that required hospitalization: No Has patient had a PCN reaction occurring within the last 10 years: No If all of the above answers are "NO", then may proceed with Cephalosporin use.    Percocet [Oxycodone-Acetaminophen] Itching    Tolerates acetaminophen alone   [name] [Nitrofurantoin Monohyd Macro] Rash and Hives   [name] Hives and Rash    Reported chicken pox-like rash, not hives   Sulfa Antibiotics Rash and Hives    MEDICATIONS: Current  Outpatient Prescriptions on File Prior to Visit  Medication Sig Dispense Refill   B-12, Methylcobalamin, 1000 MCG SUBL Place 1,000 mcg under the tongue daily.      BIOTIN PO Take 1 tablet by mouth daily.     Ca Phosphate-Cholecalciferol (EQL CALCIUM GUMMIES PO) Take 2 each by mouth daily at 12 noon.      desloratadine (CLARINEX) 5 MG tablet Take 5 mg by mouth at bedtime as needed (allergies.).   12   diclofenac sodium (VOLTAREN) 1 % GEL Apply topically 4 (four) times daily.     fluticasone (FLONASE) 50 MCG/ACT nasal spray Place 1 spray into both nostrils at bedtime as needed for allergies or rhinitis.      furosemide (LASIX) 20 MG tablet TAKE 1 TABLET (20 MG TOTAL) BY MOUTH DAILY. 90 tablet 3   Ivermectin 1 % CREA Apply topically.     Macitentan (OPSUMIT) 10 MG TABS Take 1 tablet (10 mg total) by mouth daily. 90 tablet 3   Magnesium 400 MG TABS Take 400 mg by mouth daily at 12 noon.      Multiple Vitamins-Minerals (MULTIVITAMIN WITH MINERALS) tablet Take 1 tablet by mouth daily.      oxymetazoline (AFRIN) 0.05 % nasal spray Place 1 spray into both nostrils 2 (two) times daily as needed for congestion.     Probiotic Product (4X PROBIOTIC) TABS Take 1 tablet by mouth daily.     No current facility-administered medications on file prior to visit.  PAST MEDICAL HISTORY: Past Medical History:  Diagnosis Date   Anemia    as child in first grade   Anxiety    Arthritis    Back pain    Constipation    CREST syndrome (HCC)    Dyspnea    Family history of adverse reaction to anesthesia    mom has had problems in past   Gallbladder problem    GERD (gastroesophageal reflux disease)    Gestational diabetes mellitus in childbirth, diet controlled 1987   IBS (irritable bowel syndrome)    Joint pain    Leg edema    Menopause    Osteoarthritis (arthritis due to wear and tear of joints)    PAH (pulmonary artery hypertension) (HCC)    Pulmonary fibrosis (HCC)      Raynaud disease    hands-toes   Scleroderma (HCC)    Shortness of breath dyspnea    pulmonary fibrosis-scleraderma   Sleep apnea    Swallowing difficulty    Thyroid cyst    Vitamin D deficiency    Wears glasses     PAST SURGICAL HISTORY: Past Surgical History:  Procedure Laterality Date   CARDIAC CATHETERIZATION N/A 11/25/2015   Procedure: Right Heart Cath;  Surgeon: Dolores Patty, MD;  Location: [facility] INVASIVE CV LAB;  Service: Cardiovascular;  Laterality: N/A;   CESAREAN SECTION  87,95   CHOLECYSTECTOMY  1996   lap choli   ESOPHAGOGASTRODUODENOSCOPY (EGD) WITH PROPOFOL N/A 02/12/2017   Procedure: ESOPHAGOGASTRODUODENOSCOPY (EGD) WITH PROPOFOL;  Surgeon: Vida Rigger, MD;  Location: [facility] ENDOSCOPY;  Service: Endoscopy;  Laterality: N/A;   HIATAL HERNIA REPAIR  07/26/2015   Procedure: LAPAROSCOPIC REPAIR OF HIATAL HERNIA;  Surgeon: Luretha Murphy, MD;  Location: [facility] ORS;  Service: General;;   LAPAROSCOPIC GASTRIC SLEEVE RESECTION N/A 07/26/2015   Procedure: LAPAROSCOPIC GASTRIC SLEEVE RESECTION;  Surgeon: Luretha Murphy, MD;  Location: [facility];  Service: General;  Laterality: N/A;   LEFT AND RIGHT HEART CATHETERIZATION WITH CORONARY ANGIOGRAM N/A 03/14/2015   Procedure: LEFT AND RIGHT HEART CATHETERIZATION WITH CORONARY ANGIOGRAM;  Surgeon: Peter M Swaziland, MD;  Location: [facility] CATH LAB;  Service: Cardiovascular;  Laterality: N/A;   RADIAL HEAD ARTHROPLASTY Right 07/28/2014   Procedure: RIGHT RADIAL HEAD REPLACEMENT;  Surgeon: Dairl Ponder, MD;  Location: [facility];  Service: Orthopedics;  Laterality: Right;   RIGHT HEART CATH N/A 03/18/2017   Procedure: Right Heart Cath;  Surgeon: Dolores Patty, MD;  Location: [facility] INVASIVE CV LAB;  Service: Cardiovascular;  Laterality: N/A;   SAVORY DILATION N/A 02/12/2017   Procedure: SAVORY DILATION;  Surgeon: Vida Rigger, MD;  Location: [facility];  Service: Endoscopy;  Laterality: N/A;   TUBAL LIGATION     UPPER GI  ENDOSCOPY  07/26/2015   Procedure: UPPER GI ENDOSCOPY;  Surgeon: Luretha Murphy, MD;  Location: [facility];  Service: General;;   VAGINAL HYSTERECTOMY  2010   LAVH    SOCIAL HISTORY: Social History  Substance Use Topics   Smoking status: Never Smoker   Smokeless tobacco: Never Used   Alcohol use 0.0 oz/week     Comment: occ    FAMILY HISTORY: Family History  Problem Relation Age of Onset   Heart failure Mother        Stent put in 2014 for blockage   COPD Mother    Hypertension Mother    Hyperlipidemia Mother    Heart disease Mother    Thyroid disease Mother    Cancer Mother  Depression Mother    Anxiety disorder Mother    Heart disease Father    Heart disease Son        Repair ASD    ROS: Review of Systems  Constitutional: Positive for malaise/fatigue and weight loss.  Gastrointestinal: Negative for nausea and vomiting.  Musculoskeletal:       Negative muscle weakness  Neurological: Positive for headaches.    PHYSICAL EXAM: Blood pressure 99/67, pulse 77, temperature 98.3 F (36.8 C), temperature source Oral, height  (1.626 m), weight 201 lb (91.2 kg), SpO2 100 %. Body mass index is 34.5 kg/m. Physical Exam  Constitutional: She is oriented to person, place, and time. She appears well-developed and well-nourished.  Cardiovascular: Normal rate.   Pulmonary/Chest: Effort normal.  Musculoskeletal: Normal range of motion.  Neurological: She is oriented to person, place, and time.  Skin: Skin is warm and dry.  Psychiatric: She has a normal mood and affect. Her behavior is normal.  Vitals reviewed.   RECENT LABS AND TESTS: BMET    Component Value Date/Time   NA 142 09/10/2017 1108   K 4.3 09/10/2017 1108   CL 103 09/10/2017 1108   CO2 25 09/10/2017 1108   GLUCOSE 81 09/10/2017 1108   GLUCOSE 106 (H) 03/13/2017 1453   BUN 14 09/10/2017 1108   CREATININE 0.65 09/10/2017 1108   CREATININE 0.75 03/08/2015 1501   CALCIUM 9.2 09/10/2017 1108     GFRNONAA 102 09/10/2017 1108   GFRNONAA >89 03/08/2015 1501   GFRAA 118 09/10/2017 1108   GFRAA >89 03/08/2015 1501   Lab Results  Component Value Date   HGBA1C 5.0 09/10/2017   Lab Results  Component Value Date   INSULIN 6.1 09/10/2017   CBC    Component Value Date/Time   WBC 6.7 09/10/2017 1108   WBC 7.3 03/13/2017 1453   RBC 4.60 09/10/2017 1108   RBC 4.55 03/13/2017 1453   HGB 13.9 09/10/2017 1108   HCT 41.6 09/10/2017 1108   PLT 235 03/13/2017 1453   MCV 90 09/10/2017 1108   MCH 30.2 09/10/2017 1108   MCH 30.1 03/13/2017 1453   MCHC 33.4 09/10/2017 1108   MCHC 33.5 03/13/2017 1453   RDW 14.1 09/10/2017 1108   LYMPHSABS 2.3 09/10/2017 1108   MONOABS 0.9 07/28/2015 0455   EOSABS 0.2 09/10/2017 1108   BASOSABS 0.0 09/10/2017 1108   Iron/TIBC/Ferritin/ %Sat No results found for: IRON, TIBC, FERRITIN, IRONPCTSAT Lipid Panel     Component Value Date/Time   CHOL 151 09/10/2017 1108   TRIG 102 09/10/2017 1108   HDL 70 09/10/2017 1108   LDLCALC 61 09/10/2017 1108   Hepatic Function Panel     Component Value Date/Time   PROT 6.5 09/10/2017 1108   ALBUMIN 4.4 09/10/2017 1108   AST 23 09/10/2017 1108   ALT 17 09/10/2017 1108   ALKPHOS 90 09/10/2017 1108   BILITOT 0.5 09/10/2017 1108      Component Value Date/Time   TSH 2.690 09/10/2017 1108    ASSESSMENT AND PLAN: Vitamin D deficiency - Plan: Vitamin D, Ergocalciferol, (DRISDOL) 50000 units CAPS capsule  Other headache syndrome - Plan: Ambulatory referral to Neurology  Class 1 obesity with serious comorbidity and body mass index (BMI) of 34.0 to 34.9 in adult, unspecified obesity type  PLAN:  Vitamin D Deficiency Rachael Camacho was informed that low vitamin D levels contributes to fatigue and are associated with obesity, breast, and colon cancer. She agrees to start to take prescription Vit D ,000 IU  every week #4 with 1 refill and will follow up for routine testing of vitamin D, at least 2-3 times per  year. She was informed of the risk of over-replacement of vitamin D and agrees to not increase her dose unless he discusses this with [place] first. Rachael Camacho agrees to follow up with our clinic as needed.  Headache We will refer Rachael Camacho to Dr. Lucia Gaskins at [facility] to evaluate and treat. Rachael Camacho agrees to follow up with our clinic as needed.  Obesity Rachael Camacho is currently in the action stage of change. As such, her goal is to continue with weight loss efforts She has agreed to keep a food journal with 1100 to 1300 calories and 75+ grams of protein daily Rachael Camacho has been instructed to work up to a goal of 150 minutes of combined cardio and strengthening exercise per week for weight loss and overall health benefits. We discussed the following Behavioral Modification Strategies today: keep a strict food journal, increasing lean protein intake and decreasing simple carbohydrates   Rachael Camacho has agreed to follow up with our clinic as needed. She was informed of the importance of frequent follow up visits to maximize her success with intensive lifestyle modifications for her multiple health conditions.  I, Nevada Crane, am acting as transcriptionist for Quillian Quince, MD  I have reviewed the above documentation for accuracy and completeness, and I agree with the above. -Quillian Quince, MD    OBESITY BEHAVIORAL INTERVENTION VISIT  Today's visit was # 2 out of 22.  Starting weight: 207 lbs Starting date: 09/10/17 Today's weight : 201 lbs  Today's date: 09/17/2017 Total lbs lost to date: 6 (Patients must lose 7 lbs in the first 6 months to continue with counseling)   ASK: We discussed the diagnosis of obesity with Rachael Camacho today and Rachael Camacho agreed to give [place] permission to discuss obesity behavioral modification therapy today.  ASSESS: Rachael Camacho has the diagnosis of obesity and her BMI today is 34.48 Rachael Camacho is in the action stage of change   ADVISE: Rachael Camacho was educated on the  multiple health risks of obesity as well as the benefit of weight loss to improve her health. She was advised of the need for long term treatment and the importance of lifestyle modifications.  AGREE: Multiple dietary modification options and treatment options were discussed and  Rachael Camacho agreed to keep a food journal with 1100 to 1300 calories and 75+ grams of protein daily We discussed the following Behavioral Modification Strategies today: keep a strict food journal, increasing lean protein intake and decreasing simple carbohydrates

## 2017-09-24 ENCOUNTER — Ambulatory Visit (INDEPENDENT_AMBULATORY_CARE_PROVIDER_SITE_OTHER): Payer: BLUE CROSS/BLUE SHIELD | Admitting: Dietician

## 2017-09-24 VITALS — Ht 64.0 in | Wt 203.0 lb

## 2017-09-24 DIAGNOSIS — Z6834 Body mass index (BMI) 34.0-34.9, adult: Secondary | ICD-10-CM | POA: Diagnosis not present

## 2017-09-24 DIAGNOSIS — Z9884 Bariatric surgery status: Secondary | ICD-10-CM | POA: Diagnosis not present

## 2017-09-24 DIAGNOSIS — Z9189 Other specified personal risk factors, not elsewhere classified: Secondary | ICD-10-CM

## 2017-09-24 DIAGNOSIS — E669 Obesity, unspecified: Secondary | ICD-10-CM | POA: Diagnosis not present

## 2017-09-30 NOTE — Progress Notes (Signed)
  Office: [phone]  /  Fax: [phone]  OBESITY AND PREVENTATIVE COUNSELING BEHAVIORAL INTERVENTION VISIT  Today's one-on-one visit was # 3 out of 22, Rachael Camacho is also attending the group BOT#1  Starting weight: 207 lbs Starting date: 09/10/17 Today's weight : Weight: 203 lb (92.1 kg)  Today's date: 09/24/17 Total lbs lost to date: 4 lbs (Patients must lose 7 lbs in the first 6 months to continue with counseling)  PREVENTATIVE COUNSELING: Rachael Camacho is at high risk of developing multiple serious health conditions including uncontrolled diabetes, coronary artery disease, heart failure, sleep apnea, chronic pain, depression, obesity related cancers and more due to her weight. These risks have been discussed in depth and Rachael Camacho has agreed to work on the underlying disease of obesity to decrease the risk of developing any and all of these obesity related disease.  Rachael Camacho received intensive counseling today on specific behavioral modifications to get her back on track s/p her lap sleeve gastrectomy. She is keeping a food journal 1100-1300 calories and 75+ grams of protein. She states her hunger is well controlled and she is reaching her protein goals approximately 100% of the time.   ASK: We discussed the diagnosis of obesity with Rachael Camacho today and Rachael Camacho agreed to give [place] permission to discuss obesity behavioral modification therapy today.  ASSESS: Rachael Camacho has the diagnosis of obesity and her BMI today is 34.83 Rachael Camacho is in the action stage of change   ADVISE: Rachael Camacho was educated on the multiple health risks of obesity as well as the benefit of weight loss to improve her health. She was advised of the need for long term treatment and the importance of lifestyle modifications.  AGREE: Multiple dietary modification options and treatment options were discussed and  Rachael Camacho agreed to keep a food journal with 1100-1300 calories and 75+ grams protein  We discussed the following Behavioral  Modification Stratagies today: increasing lean protein intake, work on meal planning and easy cooking plans and decrease snacking   We spent > than 50% of the 15 minute visit on the counseling as documented in the note.

## 2017-10-01 ENCOUNTER — Ambulatory Visit (INDEPENDENT_AMBULATORY_CARE_PROVIDER_SITE_OTHER): Payer: BLUE CROSS/BLUE SHIELD | Admitting: Dietician

## 2017-10-01 VITALS — Ht 64.0 in | Wt 202.0 lb

## 2017-10-01 DIAGNOSIS — Z6834 Body mass index (BMI) 34.0-34.9, adult: Secondary | ICD-10-CM | POA: Diagnosis not present

## 2017-10-01 DIAGNOSIS — E669 Obesity, unspecified: Secondary | ICD-10-CM | POA: Diagnosis not present

## 2017-10-01 DIAGNOSIS — Z9189 Other specified personal risk factors, not elsewhere classified: Secondary | ICD-10-CM | POA: Diagnosis not present

## 2017-10-01 DIAGNOSIS — Z9884 Bariatric surgery status: Secondary | ICD-10-CM

## 2017-10-02 NOTE — Progress Notes (Signed)
  Office: [phone]  /  Fax: [phone]  OBESITY AND PREVENTATIVE COUNSELING BEHAVIORAL INTERVENTION VISIT  Today's visit was # 4 out of 22, BOT# 2  Starting weight: 207 lbs Starting date: 09/10/17 Today's weight : Weight: 202 lb (91.6 kg)  Today's date:10/01/17 Total lbs lost to date: 5 lbs (Patients must lose 7 lbs in the first 6 months to continue with counseling)  PREVENTATIVE COUNSELING: Rachael Camacho is at high risk of developing multiple serious health conditions including uncontrolled diabetes, coronary artery disease, heart failure, sleep apnea, chronic pain, depression, obesity related cancers and more due to her weight. These risks have been discussed in depth and Rachael Camacho has agreed to work on the underlying disease of obesity to decrease the risk of developing any and all of these obesity related disease.  Rachael Camacho  received intensive behavior modification counseling today on emotional eating. Detailed strategies for handling emotional eating was discussed and written informations was provided. Rachael Camacho is s/p laparoscopic sleeve gastrectomy and is actively working on an obesity treatment plan. Rachael Camacho reports she did much better at journaling this week and did well staying within her calorie goal. Some days still struggling to reach her protein goal.    ASK: We discussed the diagnosis of obesity with Rachael Camacho today and Rachael Camacho agreed to give [place] permission to discuss obesity behavioral modification therapy today.  ASSESS: Rachael Camacho has the diagnosis of obesity and her BMI today is 34.66 Rachael Camacho is in the action stage of change   ADVISE: Rachael Camacho was educated on the multiple health risks of obesity as well as the benefit of weight loss to improve her health. She was advised of the need for long term treatment and the importance of lifestyle modifications.  AGREE: Multiple dietary modification options and treatment options were discussed and  Rachael Camacho agreed to keep a food journal with  [phone] calories and 75 + grams protein  We discussed the following Behavioral Modification Stratagies today: increasing lean protein intake, decreasing simple carbohydrates , emotional eating strategies, ways to avoid boredom eating, ways to avoid night time snacking and avoiding temptations  We spent > than 50% of the 15 minute visit on the counseling as documented in the note.

## 2017-10-08 ENCOUNTER — Ambulatory Visit (INDEPENDENT_AMBULATORY_CARE_PROVIDER_SITE_OTHER): Payer: BLUE CROSS/BLUE SHIELD | Admitting: Dietician

## 2017-10-15 ENCOUNTER — Ambulatory Visit (INDEPENDENT_AMBULATORY_CARE_PROVIDER_SITE_OTHER): Payer: BLUE CROSS/BLUE SHIELD | Admitting: Dietician

## 2017-10-15 VITALS — Ht 64.0 in | Wt 203.0 lb

## 2017-10-15 DIAGNOSIS — Z6834 Body mass index (BMI) 34.0-34.9, adult: Secondary | ICD-10-CM

## 2017-10-15 DIAGNOSIS — Z9189 Other specified personal risk factors, not elsewhere classified: Secondary | ICD-10-CM | POA: Diagnosis not present

## 2017-10-15 DIAGNOSIS — E669 Obesity, unspecified: Secondary | ICD-10-CM

## 2017-10-15 DIAGNOSIS — Z9884 Bariatric surgery status: Secondary | ICD-10-CM

## 2017-10-21 NOTE — Progress Notes (Signed)
  Office: [phone]  /  Fax: [phone]  OBESITY AND PREVENTATIVE COUNSELING BEHAVIORAL INTERVENTION VISIT  Today's visit was # 6 out of 22, BOT# 4  Starting weight: 207 lbs Starting date: 09/10/17 Today's weight : Weight: 203 lb (92.1 kg)  Today's date:10/15/17 Total lbs lost to date: 4 lbs (Patients must lose 7 lbs in the first 6 months to continue with counseling)  PREVENTATIVE COUNSELING: Rachael Camacho is at high risk of developing multiple serious health conditions including uncontrolled diabetes, coronary artery disease, heart failure, sleep apnea, chronic pain, depression, obesity related cancers and more due to her weight. These risks have been discussed in depth and Rachael Camacho has agreed to work on the underlying disease of obesity to decrease the risk of developing any and all of these obesity related disease.  Eryn received intensive nutrition counseling today on healthy eating during the holiday's, celebrations and vacations and dealing with getting off track. Detailed behavior modification strategies for staying motivated during holiday's/celebrations/vacations was discussed. Rachael Camacho  is journaling her food intake 100% of the time and is actively working on her obesity treatment plan.   ASK: We discussed the diagnosis of obesity with Burley SaverAngela D Raineri today and Rachael Camacho agreed to give us permission to discuss obesity behavioral modification therapy today.  ASSESS: Rachael Camacho has the diagnosis of obesity and her BMI today is 34.83  Rachael Camacho is in the action stage of change   ADVISE: Rachael Camacho was educated on the multiple health risks of obesity as well as the benefit of weight loss to improve her health. She was advised of the need for long term treatment and the importance of lifestyle modifications.  AGREE: Multiple dietary modification options and treatment options were discussed and  Rachael Camacho agreed to keep a food journal with 1100-1300 calories and 75+ grams protein  We discussed the  following Behavioral Modification Stratagies today: increasing lean protein intake, work on meal planning and easy cooking plans and holiday eating strategies   We spent > than 50% of the 15 minute visit on the counseling as documented in the note.

## 2017-10-29 ENCOUNTER — Ambulatory Visit (INDEPENDENT_AMBULATORY_CARE_PROVIDER_SITE_OTHER): Payer: BLUE CROSS/BLUE SHIELD | Admitting: Family Medicine

## 2017-10-29 VITALS — BP 108/77 | HR 68 | Temp 98.0°F | Ht 64.0 in | Wt 204.0 lb

## 2017-10-29 DIAGNOSIS — Z6835 Body mass index (BMI) 35.0-35.9, adult: Secondary | ICD-10-CM | POA: Diagnosis not present

## 2017-10-29 DIAGNOSIS — E559 Vitamin D deficiency, unspecified: Secondary | ICD-10-CM

## 2017-10-29 MED ORDER — VITAMIN D (ERGOCALCIFEROL) 1.25 MG (50000 UNIT) PO CAPS: 50000.0000 [IU] | ORAL_CAPSULE | ORAL | 1 refills | Status: DC

## 2017-10-30 NOTE — Progress Notes (Signed)
Office: [phone]  /  Fax: [phone]   HPI:   Chief Complaint: OBESITY Rachael Camacho is here to discuss her progress with her obesity treatment plan. She is on the keep a food journal with 1100-1300 calories and 75+ grams of protein daily and is following her eating plan approximately 75-80 % of the time. She states she is exercising 0 minutes 0 times per week. Rachael Camacho has struggled with meal planning and prepping while working extra long hours at work.  Her weight is 204 lb (92.5 kg) today and has gained 1 pound since her last visit. She has lost 3 lbs since starting treatment with us.  Vitamin D deficiency Rachael Camacho has a diagnosis of vitamin D deficiency. She is stable on prescription Vit D but is not yet at goal. She notes fatigue is starting to improve and denies nausea, vomiting or muscle weakness.  ALLERGIES: Allergies  Allergen Reactions  . Amoxicillin Other (See Comments)    Immediate yeast infection  Has patient had a PCN reaction causing immediate rash, facial/tongue/throat swelling, SOB or lightheadedness with hypotension: No Has patient had a PCN reaction causing severe rash involving mucus membranes or skin necrosis: No Has patient had a PCN reaction that required hospitalization: No Has patient had a PCN reaction occurring within the last 10 years: No If all of the above answers are "NO", then may proceed with Cephalosporin use.   [name]. Percocet [Oxycodone-Acetaminophen] Itching    Tolerates acetaminophen alone  . [name] [Nitrofurantoin Monohyd Macro] Rash and Hives  . [name] Hives and Rash    Reported chicken pox-like rash, not hives  . Sulfa Antibiotics Rash and Hives    MEDICATIONS: Current Outpatient Medications on File Prior to Visit  Medication Sig Dispense Refill  . B-12, Methylcobalamin, 1000 MCG SUBL Place 1,000 mcg under the tongue daily.     [name]. BIOTIN PO Take 1 tablet by mouth daily.    . Ca Phosphate-Cholecalciferol (EQL CALCIUM GUMMIES PO) Take 2 each  by mouth daily at 12 noon.     . desloratadine (CLARINEX) 5 MG tablet Take 5 mg by mouth at bedtime as needed (allergies.).   12  . diclofenac sodium (VOLTAREN) 1 % GEL Apply topically 4 (four) times daily.    . fluticasone (FLONASE) 50 MCG/ACT nasal spray Place 1 spray into both nostrils at bedtime as needed for allergies or rhinitis.     . furosemide (LASIX) 20 MG tablet TAKE 1 TABLET (20 MG TOTAL) BY MOUTH DAILY. 90 tablet 3  . Ivermectin 1 % CREA Apply topically.    . Macitentan (OPSUMIT) 10 MG TABS Take 1 tablet (10 mg total) by mouth daily. 90 tablet 3  . Magnesium 400 MG TABS Take 400 mg by mouth daily at 12 noon.     . Multiple Vitamins-Minerals (MULTIVITAMIN WITH MINERALS) tablet Take 1 tablet by mouth daily.     [name]. oxymetazoline (AFRIN) 0.05 % nasal spray Place 1 spray into both nostrils 2 (two) times daily as needed for congestion.    . Probiotic Product (4X PROBIOTIC) TABS Take 1 tablet by mouth daily.     No current facility-administered medications on file prior to visit.     PAST MEDICAL HISTORY: Past Medical History:  Diagnosis Date  . Anemia    as child in first grade  . Anxiety   . Arthritis   . Back pain   . Constipation   . CREST syndrome (HCC)   . Dyspnea   . Family history of adverse  reaction to anesthesia    mom has had problems in past  . Gallbladder problem   . GERD (gastroesophageal reflux disease)   . Gestational diabetes mellitus in childbirth, diet controlled 1987  . IBS (irritable bowel syndrome)   . Joint pain   . Leg edema   . Menopause   . Osteoarthritis (arthritis due to wear and tear of joints)   . PAH (pulmonary artery hypertension) (HCC)   . Pulmonary fibrosis (HCC)   . Raynaud disease    hands-toes  . Scleroderma (HCC)   . Shortness of breath dyspnea    pulmonary fibrosis-scleraderma  . Sleep apnea   . Swallowing difficulty   . Thyroid cyst   . Vitamin D deficiency   . Wears glasses     PAST SURGICAL HISTORY: Past Surgical  History:  Procedure Laterality Date  . CESAREAN SECTION  87,95  . CHOLECYSTECTOMY  1996   lap choli  . TUBAL LIGATION    . VAGINAL HYSTERECTOMY  2010   [facility]    SOCIAL HISTORY: Social History   Tobacco Use  . Smoking status: Never Smoker  . Smokeless tobacco: Never Used  Substance Use Topics  . Alcohol use: Yes    Alcohol/week: 0.0 oz    Comment: occ  . Drug use: No    FAMILY HISTORY: Family History  Problem Relation Age of Onset  . Heart failure Mother        Stent put in 2014 for blockage  . COPD Mother   . Hypertension Mother   . Hyperlipidemia Mother   . Heart disease Mother   . Thyroid disease Mother   . Cancer Mother   . Depression Mother   . Anxiety disorder Mother   . Heart disease Father   . Heart disease Son        Repair ASD    ROS: Review of Systems  Constitutional: Positive for malaise/fatigue. Negative for weight loss.  Gastrointestinal: Negative for nausea and vomiting.  Musculoskeletal:       Negative muscle weakness    PHYSICAL EXAM: Blood pressure 108/77, pulse 68, temperature 98 F (36.7 C), height 5\' 4"  (1.626 m), weight 204 lb (92.5 kg), SpO2 96 %. Body mass index is 35.02 kg/m. Physical Exam  Constitutional: She is oriented to person, place, and time. She appears well-developed and well-nourished.  Cardiovascular: Normal rate.  Pulmonary/Chest: Effort normal.  Musculoskeletal: Normal range of motion.  Neurological: She is oriented to person, place, and time.  Skin: Skin is warm and dry.  Psychiatric: She has a normal mood and affect. Her behavior is normal.  Vitals reviewed.   RECENT LABS AND TESTS: BMET    Component Value Date/Time   NA 142 09/10/2017 1108   K 4.3 09/10/2017 1108   CL 103 09/10/2017 1108   CO2 25 09/10/2017 1108   GLUCOSE 81 09/10/2017 1108   GLUCOSE 106 (H) 03/13/2017 1453   BUN 14 09/10/2017 1108   CREATININE 0.65 09/10/2017 1108   CREATININE 0.75 03/08/2015 1501   CALCIUM 9.2 09/10/2017 1108    GFRNONAA 102 09/10/2017 1108   GFRNONAA >89 03/08/2015 1501   GFRAA 118 09/10/2017 1108   GFRAA >89 03/08/2015 1501   Lab Results  Component Value Date   HGBA1C 5.0 09/10/2017   Lab Results  Component Value Date   INSULIN 6.1 09/10/2017   CBC    Component Value Date/Time   WBC 6.7 09/10/2017 1108   WBC 7.3 03/13/2017 1453   RBC 4.60 09/10/2017  1108   RBC 4.55 03/13/2017 1453   HGB 13.9 09/10/2017 1108   HCT 41.6 09/10/2017 1108   PLT 235 03/13/2017 1453   MCV 90 09/10/2017 1108   MCH 30.2 09/10/2017 1108   MCH 30.1 03/13/2017 1453   MCHC 33.4 09/10/2017 1108   MCHC 33.5 03/13/2017 1453   RDW 14.1 09/10/2017 1108   LYMPHSABS 2.3 09/10/2017 1108   MONOABS 0.9 07/28/2015 0455   EOSABS 0.2 09/10/2017 1108   BASOSABS 0.0 09/10/2017 1108   Iron/TIBC/Ferritin/ %Sat No results found for: IRON, TIBC, FERRITIN, IRONPCTSAT Lipid Panel     Component Value Date/Time   CHOL 151 09/10/2017 1108   TRIG 102 09/10/2017 1108   HDL 70 09/10/2017 1108   LDLCALC 61 09/10/2017 1108   Hepatic Function Panel     Component Value Date/Time   PROT 6.5 09/10/2017 1108   ALBUMIN 4.4 09/10/2017 1108   AST 23 09/10/2017 1108   ALT 17 09/10/2017 1108   ALKPHOS 90 09/10/2017 1108   BILITOT 0.5 09/10/2017 1108      Component Value Date/Time   TSH 2.690 09/10/2017 1108    ASSESSMENT AND PLAN: Vitamin D deficiency - Plan: Vitamin D, Ergocalciferol, (DRISDOL) 50000 units CAPS capsule  Class 2 severe obesity with serious comorbidity and body mass index (BMI) of 35.0 to 35.9 in adult, unspecified obesity type (HCC)  PLAN:  Vitamin D Deficiency Rachael Camacho was informed that low vitamin D levels contributes to fatigue and are associated with obesity, breast, and colon cancer. Rachael Camacho agrees to continue taking prescription Vit D @50 ,000 IU every week #4 and we will refill for 1 month. She will follow up for routine testing of vitamin D, at least 2-3 times per year. She was informed of the risk of  over-replacement of vitamin D and agrees to not increase her dose unless he discusses this with [place] first. We will recheck labs in 1 month. Rachael Camacho agrees to follow up with our clinic in 2 weeks with our dietitian and follow up in 4 weeks with myself.  Obesity Rachael Camacho is currently in the action stage of change. As such, her goal is to continue with weight loss efforts She has agreed to change to follow the Category 2 plan Rachael Camacho has been instructed to work up to a goal of 150 minutes of combined cardio and strengthening exercise per week for weight loss and overall health benefits. We discussed the following Behavioral Modification Strategies today: increasing lean protein intake, decreasing simple carbohydrates, and planning for success    Rachael Camacho has agreed to follow up with our clinic in 2 weeks with our dietitian and follow up in 4 weeks with myself. She was informed of the importance of frequent follow up visits to maximize her success with intensive lifestyle modifications for her multiple health conditions.  I, Burt Knack, am acting as transcriptionist for Rachael Quince, MD  I have reviewed the above documentation for accuracy and completeness, and I agree with the above. -Rachael Quince, MD      Today's visit was # 3 out of 22.  Starting weight: 207 lbs Starting date: 09/10/17 Today's weight : 204 lbs  Today's date: 10/29/2017 Total lbs lost to date: 3 (Patients must lose 7 lbs in the first 6 months to continue with counseling)   ASK: We discussed the diagnosis of obesity with Rachael Camacho today and Rachael Camacho agreed to give [place] permission to discuss obesity behavioral modification therapy today.  ASSESS: Rachael Camacho has the diagnosis of obesity and  her BMI today is 1835 Rachael Camacho is in the action stage of change   ADVISE: Rachael Camacho was educated on the multiple health risks of obesity as well as the benefit of weight loss to improve her health. She was advised of the need for long term  treatment and the importance of lifestyle modifications.  AGREE: Multiple dietary modification options and treatment options were discussed and  Rachael Camacho agreed to follow the Category 2 plan We discussed the following Behavioral Modification Strategies today: increasing lean protein intake, decreasing simple carbohydrates, and planning for success.

## 2017-11-11 ENCOUNTER — Ambulatory Visit (INDEPENDENT_AMBULATORY_CARE_PROVIDER_SITE_OTHER): Payer: BLUE CROSS/BLUE SHIELD | Admitting: Family Medicine

## 2017-11-11 ENCOUNTER — Ambulatory Visit (INDEPENDENT_AMBULATORY_CARE_PROVIDER_SITE_OTHER): Payer: BLUE CROSS/BLUE SHIELD | Admitting: Dietician

## 2017-11-11 VITALS — Ht 64.0 in | Wt 204.0 lb

## 2017-11-11 DIAGNOSIS — Z9884 Bariatric surgery status: Secondary | ICD-10-CM | POA: Diagnosis not present

## 2017-11-11 DIAGNOSIS — F3289 Other specified depressive episodes: Secondary | ICD-10-CM | POA: Diagnosis not present

## 2017-11-11 DIAGNOSIS — Z9189 Other specified personal risk factors, not elsewhere classified: Secondary | ICD-10-CM

## 2017-11-11 DIAGNOSIS — Z6835 Body mass index (BMI) 35.0-35.9, adult: Secondary | ICD-10-CM

## 2017-11-13 ENCOUNTER — Encounter: Payer: Self-pay | Admitting: Neurology

## 2017-11-13 ENCOUNTER — Encounter (INDEPENDENT_AMBULATORY_CARE_PROVIDER_SITE_OTHER): Payer: Self-pay | Admitting: Family Medicine

## 2017-11-13 ENCOUNTER — Ambulatory Visit: Payer: BLUE CROSS/BLUE SHIELD | Admitting: Neurology

## 2017-11-13 VITALS — BP 105/68 | HR 63 | Ht 64.0 in | Wt 208.2 lb

## 2017-11-13 DIAGNOSIS — H547 Unspecified visual loss: Secondary | ICD-10-CM | POA: Diagnosis not present

## 2017-11-13 DIAGNOSIS — G500 Trigeminal neuralgia: Secondary | ICD-10-CM | POA: Diagnosis not present

## 2017-11-13 DIAGNOSIS — R202 Paresthesia of skin: Secondary | ICD-10-CM | POA: Diagnosis not present

## 2017-11-13 DIAGNOSIS — R200 Anesthesia of skin: Secondary | ICD-10-CM | POA: Diagnosis not present

## 2017-11-13 MED ORDER — GABAPENTIN 300 MG PO CAPS: 300.0000 mg | ORAL_CAPSULE | Freq: Three times a day (TID) | ORAL | 11 refills | Status: DC

## 2017-11-13 MED ORDER — BACLOFEN 10 MG PO TABS: 10.0000 mg | ORAL_TABLET | Freq: Three times a day (TID) | ORAL | 11 refills | Status: DC

## 2017-11-13 MED ORDER — BUPROPION HCL ER (SR) 150 MG PO TB12: 150.0000 mg | ORAL_TABLET | Freq: Every day | ORAL | 0 refills | Status: DC

## 2017-11-13 NOTE — Patient Instructions (Signed)
Gabapentin 2-x a day At bedtime 1/2 - 1 tab Baclofen    Trigeminal Neuralgia Trigeminal neuralgia is a nerve disorder that causes attacks of severe facial pain. The attacks last from a few seconds to several minutes. They can happen for days, weeks, or months and then go away for months or years. Trigeminal neuralgia is also called tic douloureux. What are the causes? This condition is caused by damage to a nerve in the face that is called the trigeminal nerve. An attack can be triggered by:  Talking.  Chewing.  Putting on makeup.  Washing your face.  Shaving your face.  Brushing your teeth.  Touching your face.  What increases the risk? This condition is more likely to develop in:  Women.  People who are 52 years of age or older.  What are the signs or symptoms? The main symptom of this condition is pain in the jaw, lips, eyes, nose, scalp, forehead, and face. The pain may be intense, stabbing, electric, or shock-like. How is this diagnosed? This condition is diagnosed with a physical exam. A CT scan or MRI may be done to rule out other conditions that can cause facial pain. How is this treated? This condition may be treated with:  Avoiding the things that trigger your attacks.  Pain medicine.  Surgery. This may be done in severe cases if other medical treatment does not provide relief.  Follow these instructions at home:  Take over-the-counter and prescription medicines only as told by your health care provider.  If you wish to get pregnant, talk with your health care provider before you start trying to get pregnant.  Avoid the things that trigger your attacks. It may help to: ? Chew on the unaffected side of your mouth. ? Avoid touching your face. ? Avoid blasts of hot or cold air. Contact a health care provider if:  Your pain medicine is not helping.  You develop new, unexplained symptoms, such as: ? Double vision. ? Facial weakness. ? Changes in  hearing or balance.  You become pregnant. Get help right away if:  Your pain is unbearable, and your pain medicine does not help. This information is not intended to replace advice given to you by your health care provider. Make sure you discuss any questions you have with your health care provider. Document Released: 12/07/2000 Document Revised: 08/12/2016 Document Reviewed: 04/04/2015 Elsevier Interactive Patient Education  [employer]2018 Elsevier Inc.

## 2017-11-13 NOTE — Progress Notes (Signed)
[facility]  Provider:  Dr Lucia GaskinsAhern Referring Provider: Quillian Quincearen Beasley, MD Primary Care Physician:  Daisy Florooss, Charles Alan, MD  CC:  Headache  HPI:  Rachael Camacho is a 51 y.o. female here as a referral from Dr. Tenny Crawoss for headache. PMHx scleroderma, CREST, Raynaud, pulmonary fibrosis, PAH.  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 Last February she yawned and it popped and she went to bed and had a horrible headache, they didn't see anything on exam, walk-in clinic saw nothing, she saw ENT. She saw Dr. Dorma RussellKraus who discovered she had arthritis, she tried to see a TMJ expert, she saw an oral surgeon and he couldn't do anything. When she yawns it hurts. She has shooting, intense pain or just an ache in the middle of the night. She has a mouth guard but this worsens the pain. Triggers include sudden movements. Even rubbing that side makes it worse. Movements make it worse. Brief. No autonomic symptoms, happens 20x a day.  No weakness of the face or any other symptoms. No other focal neurologic deficits, associated symptoms, inciting events or modifiable factors.  Reviewed notes, labs and imaging from outside physicians, which showed:  CMP/CBC normal  CT Temporal bone 04/2017:  FINDINGS: Right ear: The external auditory canal is normal.The mastoids are well aerated with no fluid or opacification.The middle ear cavity is normal with no fluid or soft tissue mass.The ossicles are intact without disruption or erosion.The inner ear  structures are normal with no congenital malformation or bony demineralization.The carotid canal and jugular foramen regions are normal.  Left ear: The external auditory canal is normal.The mastoids are well aerated with no fluid or opacification.The middle ear cavity is normal with no fluid or soft tissue mass.The ossicles are intact without disruption or erosion.The inner ear    structures are normal with no congenital malformation or bony demineralization.The carotid canal and jugular foramen regions are normal.  #Paranasal sinuses: Clear #Nasopharynx: Normal #Intracranial structures: No abnormality seen. #Temporomandibular joints: There is mild irregularity of the articular surface of the right mandibular condyle with a small degenerative cyst in the mandibular condyle. Consistent with mild osteoarthritis. The left temporomandibular joint is normal. #Additional findings: There are elongated styloid processes and calcified stylohyoid ligaments bilaterally. The right styloid process/mylohyoid ligament measures 3.1 cm. The left styloid process/calcified stylohyoid ligament measures at least 3.7 cm  but is incompletely imaged. These findings can be seen with Eagle's syndrome which can cause ear pain.  Review of Systems: Patient complains of symptoms per HPI as well as the following symptoms: blurred vision, difficulty swallowing, dizziness, feeling cold, SOB, swelling in legs, moles. Pertinent negatives and positives per HPI. All others negative.   Social History   Socioeconomic History  . Marital status: Married    Spouse name: Lorrin MaisW Todd Weight  . Number of children: 2  . Years of education: Not on file  . Highest education level: Not on file  Social Needs  . Financial resource strain: Not on file  . Food insecurity - worry: Not on file  . Food insecurity - inability: Not on file  . Transportation needs - medical: Not on file  . Transportation needs - non-medical: Not on file  Occupational History  . 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 Smoking status: Never Smoker  . Smokeless tobacco: Never Used  Substance and Sexual  Activity  . Alcohol use: Yes    Alcohol/week: 0.0 oz    Comment: occ  . Drug use: No  . Sexual activity: Not on file  Other Topics Concern  . Not on file  Social History  Narrative  . Not on file    Family History  Problem Relation Age of Onset  . Heart failure Mother        Stent put in 2014 for blockage  . COPD Mother   . Hypertension Mother   . Hyperlipidemia Mother   . Heart disease Mother   . Thyroid disease Mother   . Cancer Mother   . Depression Mother   . Anxiety disorder Mother   . Heart disease Father   . Heart disease Son        Repair ASD  . Neuropathy Neg Hx     Past Medical History:  Diagnosis Date  . Anemia    as child in first grade  . Anxiety   . Arthritis   . Back pain   . Constipation   . CREST syndrome (HCC)   . Dyspnea   . Family history of adverse reaction to anesthesia    mom has had problems in past  . Gallbladder problem   . GERD (gastroesophageal reflux disease)   . Gestational diabetes mellitus in childbirth, diet controlled 1987  . IBS (irritable bowel syndrome)   . Joint pain   . Leg edema   . Menopause   . Osteoarthritis (arthritis due to wear and tear of joints)   . PAH (pulmonary artery hypertension) (HCC)   . Pulmonary fibrosis (HCC)   . Raynaud disease    hands-toes  . Scleroderma (HCC)   . Shortness of breath dyspnea    pulmonary fibrosis-scleraderma  . Sleep apnea   . Swallowing difficulty   . Thyroid cyst   . Vitamin D deficiency   . Wears glasses     Past Surgical History:  Procedure Laterality Date  . CARDIAC CATHETERIZATION N/A 11/25/2015   Procedure: Right Heart Cath;  Surgeon: Dolores Pattyaniel R Bensimhon, MD;  [facility];  Service: Cardiovascular;  Laterality: N/A;  . CESAREAN SECTION  87,95  . CHOLECYSTECTOMY  1996   lap choli  . ESOPHAGOGASTRODUODENOSCOPY (EGD) WITH PROPOFOL N/A 02/12/2017   Procedure: ESOPHAGOGASTRODUODENOSCOPY (EGD) WITH PROPOFOL;  Surgeon: Vida RiggerMarc Magod, MD;  Location: [facility];  Service: Endoscopy;  Laterality: N/A;  . HIATAL HERNIA REPAIR  07/26/2015   Procedure: LAPAROSCOPIC REPAIR OF HIATAL HERNIA;  Surgeon: Luretha MurphyMatthew Heward, MD;  Location: [facility];   Service: General;;  . LAPAROSCOPIC GASTRIC SLEEVE RESECTION N/A 07/26/2015   Procedure: LAPAROSCOPIC GASTRIC SLEEVE RESECTION;  Surgeon: Luretha MurphyMatthew Alred, MD;  Location: [facility];  Service: General;  Laterality: N/A;  . LEFT AND RIGHT HEART CATHETERIZATION WITH CORONARY ANGIOGRAM N/A 03/14/2015   Procedure: LEFT AND RIGHT HEART CATHETERIZATION WITH CORONARY ANGIOGRAM;  Surgeon: Peter M SwazilandJordan, MD;  [facility];  Service: Cardiovascular;  Laterality: N/A;  . RADIAL HEAD ARTHROPLASTY Right 07/28/2014   Procedure: RIGHT RADIAL HEAD REPLACEMENT;  Surgeon: Dairl PonderMatthew Weingold, MD;  Location: [facility];  Service: Orthopedics;  Laterality: Right;  . RIGHT HEART CATH N/A 03/18/2017   Procedure: Right Heart Cath;  Surgeon: Dolores Pattyaniel R Bensimhon, MD;  [facility];  Service: Cardiovascular;  Laterality: N/A;  . SAVORY DILATION N/A 02/12/2017   Procedure: SAVORY DILATION;  Surgeon: Vida RiggerMarc Magod, MD;  Location: [facility];  Service: Endoscopy;  Laterality:  N/A;  . TUBAL LIGATION    . UPPER GI ENDOSCOPY  07/26/2015   Procedure: UPPER GI ENDOSCOPY;  Surgeon: Luretha Murphy, MD;  Location: [facility];  Service: General;;  . VAGINAL HYSTERECTOMY  2010   [facility]    Current Outpatient Medications  Medication Sig Dispense Refill  . B-12, Methylcobalamin, 1000 MCG SUBL Place 1,000 mcg under the tongue daily.     [name] BIOTIN PO Take 1 tablet by mouth daily.    [name] desloratadine (CLARINEX) 5 MG tablet Take 5 mg by mouth at bedtime as needed (allergies.).   12  . fluticasone (FLONASE) 50 MCG/ACT nasal spray Place 1 spray into both nostrils at bedtime as needed for allergies or rhinitis.     . furosemide (LASIX) 20 MG tablet TAKE 1 TABLET (20 MG TOTAL) BY MOUTH DAILY. 90 tablet 3  . Macitentan (OPSUMIT) 10 MG TABS Take 1 tablet (10 mg total) by mouth daily. 90 tablet 3  . Multiple Vitamins-Minerals (MULTIVITAMIN WITH MINERALS) tablet Take 1 tablet by mouth daily.     . Probiotic Product (4X PROBIOTIC)  TABS Take 1 tablet by mouth daily.    . Vitamin D, Ergocalciferol, (DRISDOL) 50000 units CAPS capsule Take 1 capsule (50,000 Units total) every 7 (seven) days by mouth. 4 capsule 1  . baclofen (LIORESAL) 10 MG tablet Take 1 tablet (10 mg total) by mouth 3 (three) times daily. 90 each 11  . gabapentin (NEURONTIN) 300 MG capsule Take 1 capsule (300 mg total) by mouth 3 (three) times daily. 90 capsule 11   No current facility-administered medications for this visit.     Allergies as of 11/13/2017 - Review Complete 11/13/2017  Allergen Reaction Noted  . Amoxicillin Other (See Comments) 02/06/2017  . Percocet [oxycodone-acetaminophen] Itching 07/27/2014  . [name] [nitrofurantoin monohyd macro] Rash and Hives 07/25/2014  . [name] Hives and Rash 07/25/2014  . Sulfa antibiotics Rash and Hives 07/25/2014    Vitals: BP 105/68   Pulse 63   Ht 5\' 4"  (1.626 m)   Wt 208 lb 3.2 oz (94.4 kg)   BMI 35.74 kg/m  Last Weight:  Wt Readings from Last 1 Encounters:  11/13/17 208 lb 3.2 oz (94.4 kg)   Last Height:   Ht Readings from Last 1 Encounters:  11/13/17 5\' 4"  (1.626 m)   Physical exam: Exam: Gen: NAD, conversant, well nourised, obese, well groomed                     CV: RRR, no MRG. No Carotid Bruits. No peripheral edema, warm, nontender Eyes: Conjunctivae clear without exudates or hemorrhage  Neuro: Detailed Neurologic Exam  Speech:    Speech is normal; fluent and spontaneous with normal comprehension.  Cognition:    The patient is oriented to person, place, and time;     recent and remote memory intact;     language fluent;     normal attention, concentration,     fund of knowledge Cranial Nerves:    The pupils are equal, round, and reactive to light. The fundi are normal and spontaneous venous pulsations are present. Visual fields are full to finger confrontation. Extraocular movements are intact. Trigeminal sensation is intact and the muscles of mastication are normal.  The face is symmetric. The palate elevates in the midline. Hearing intact. Voice is normal. Shoulder shrug is normal. The tongue has normal motion without fasciculations.   Coordination:    Normal finger to nose and heel to shin. Normal rapid alternating  movements.   Gait:    Heel-toe and tandem gait are normal.   Motor Observation:    No asymmetry, no atrophy, and no involuntary movements noted. Tone:    Normal muscle tone.    Posture:    Posture is normal. normal erect    Strength:    Strength is V/V in the upper and lower limbs.      Sensation: intact to LT     Reflex Exam:  DTR's:    Deep tendon reflexes in the upper and lower extremities are normal bilaterally.   Toes:    The toes are downgoing bilaterally.   Clonus:    Clonus is absent.       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  Discussed treatment, Tegretol has the most evidence however it can have significant side effects will try Neurontin tid and Baclofen at bedtime. Discussed surgical options, radiofrequency ablation, also injections here in the office.  Need MRI of the Brain and face/trigeminal protocol to trace the trigeminal nerve to evaluate for vascular loop or lesion or stroke or other intracranial causes of CN V injury.  Orders Placed This Encounter  Procedures  . MR FACE/TRIGEMINAL W/CM   Cc: Dr. Dalbert Garnet, Dr. Concepcion Living, MD  [facility] [address] [place], [place] [address]  Phone [phone] Fax [phone]

## 2017-11-18 ENCOUNTER — Telehealth: Payer: Self-pay | Admitting: Neurology

## 2017-11-18 ENCOUNTER — Other Ambulatory Visit: Payer: Self-pay | Admitting: Neurology

## 2017-11-18 DIAGNOSIS — G500 Trigeminal neuralgia: Secondary | ICD-10-CM

## 2017-11-18 DIAGNOSIS — R220 Localized swelling, mass and lump, head: Secondary | ICD-10-CM

## 2017-11-18 NOTE — Telephone Encounter (Signed)
Noted, thank you faxed the new order to [facility].

## 2017-11-18 NOTE — Telephone Encounter (Signed)
Arlys JohnBrian from [facility] called regarding this patients MRA of face. It needs to be put in as a face w/wo. Please place new order.

## 2017-11-18 NOTE — Telephone Encounter (Signed)
Completed. thanks

## 2017-11-20 NOTE — Progress Notes (Signed)
  Office: [phone]  /  Fax: [phone]     Rachael Camacho has a higher than average risk for cardiovascular disease due to obesity. She is here today for cardiovascular disease risk nutrition counseling. She is actively working her obesity treatment plan.  [name]'s weight today is 204 lbs, her wt has been stable since her last visit. She has lost 3 lbs since beginning treatment with us.  Rachael Camacho is struggling with emotional eating which is negatively affecting her health and weight loss efforts. She admits snacking even when she is not hungry and feels out of control at times. Cognitive behavior techniques were discussed in detail today to address her emotional eating behaviors. She was also referred to Dr. Dalbert GarnetBeasley today.   Patient was educated about food nutrients ie protein, fats, simple and complex carbohydrates and weight management and heart disease risk. Focus on portion control,  avoiding simple carbohydrates and increasing lean proteins was also discussed.  Rachael Camacho is following our Category 2 meal plan approximately 65% of the time.  Her  meal plan was individualized today for maximum benefit.  Also discussed at length the following behavioral modifications to help maximize Kasee's success: increasing lean protein intake, decreasing simple carbohydrates, ways to avoid boredom eating,  better snacking options, emotional eating strategies,  holiday eating strategies, c   Rachael Camacho has been instructed to work up to a goal of 150 minutes of combined cardio and strengthening exercise per week for weight loss and overall health benefits. She is not on a regular exercise program at this time.  Written information was provided and the following handouts were given: protein content of foods.   Office: [phone]  /  Fax: [phone]  OBESITY BEHAVIORAL INTERVENTION VISIT  Today's visit was # 4 out of 22.  Starting weight: 207 lbs Starting date: 09/10/17 Today's weight : Weight: 204 lb (92.5 kg)    Today's date:11/11/17 Total lbs lost to date: 3 (Patients must lose 7 lbs in the first 6 months to continue with counseling)   ASK: We discussed the diagnosis of obesity with [name] today and Rachael Camacho agreed to give us permission to discuss obesity behavioral modification therapy today.  ASSESS: Rachael Camacho has the diagnosis of obesity and her BMI today is 35.72 Rachael Camacho is in the action stage of change   ADVISE: Rachael Camacho was educated on the multiple health risks of obesity as well as the benefit of weight loss to improve her health. She was advised of the need for long term treatment and the importance of lifestyle modifications.  AGREE: Multiple dietary modification options and treatment options were discussed and  Rachael Camacho agreed to follow the Category 2 plan We discussed the following Behavioral Modification Stratagies today: increasing lean protein intake, decreasing simple carbohydrates , holiday eating strategies  and emotional eating strategies

## 2017-11-20 NOTE — Progress Notes (Signed)
Office: [phone]  /  Fax: [phone]   HPI:   Vitamin D deficiency Rachael Camacho has a diagnosis of vitamin D deficiency. She is currently stable on vit D but is not yet at goal. She denies nausea, vomiting or muscle weakness.  At risk for cardiovascular disease Rachael Camacho is at a higher than average risk for cardiovascular disease due to obesity. She currently denies any chest pain.  Depression with emotional eating behaviors Rachael Camacho mood is depressed. She notes increased emotional eating and sometimes feeling out of control. Rachael Camacho struggles with emotional eating and using food for comfort to the extent that it is negatively impacting her health. She often snacks when she is not hungry. Rachael Camacho sometimes feels she is out of control and then feels guilty that she made poor food choices. She has been working on behavior modification techniques to help reduce her emotional eating and has been somewhat successful. She shows no sign of suicidal or homicidal ideations.  [facility] 2/9 09/10/2017 10/18/2016 07/18/2016 05/02/2016 03/01/2016  Decreased Interest 2 0 0 0 0  Down, Depressed, Hopeless 2 0 0 0 0  PHQ - 2 Score 4 0 0 0 0  Altered sleeping 1 - - - -  Tired, decreased energy 3 - - - -  Change in appetite 1 - - - -  Feeling bad or failure about yourself  3 - - - -  Trouble concentrating 2 - - - -  Moving slowly or fidgety/restless 2 - - - -  Suicidal thoughts 0 - - - -  PHQ-9 Score 16 - - - -  Difficult doing work/chores Somewhat difficult - - - -      ALLERGIES: Allergies  Allergen Reactions  . Amoxicillin Other (See Comments)    Immediate yeast infection  Has patient had a PCN reaction causing immediate rash, facial/tongue/throat swelling, SOB or lightheadedness with hypotension: No Has patient had a PCN reaction causing severe rash involving mucus membranes or skin necrosis: No Has patient had a PCN reaction that required hospitalization: No Has patient had a PCN  reaction occurring within the last 10 years: No If all of the above answers are "NO", then may proceed with Cephalosporin use.   [name]. Percocet [Oxycodone-Acetaminophen] Itching    Tolerates acetaminophen alone  . [name] [Nitrofurantoin Monohyd Macro] Rash and Hives  . [name] Hives and Rash    Reported chicken pox-like rash, not hives  . Sulfa Antibiotics Rash and Hives    MEDICATIONS: Current Outpatient Medications on File Prior to Visit  Medication Sig Dispense Refill  . B-12, Methylcobalamin, 1000 MCG SUBL Place 1,000 mcg under the tongue daily.     [name]. BIOTIN PO Take 1 tablet by mouth daily.    [name]. desloratadine (CLARINEX) 5 MG tablet Take 5 mg by mouth at bedtime as needed (allergies.).   12  . fluticasone (FLONASE) 50 MCG/ACT nasal spray Place 1 spray into both nostrils at bedtime as needed for allergies or rhinitis.     . furosemide (LASIX) 20 MG tablet TAKE 1 TABLET (20 MG TOTAL) BY MOUTH DAILY. 90 tablet 3  . Macitentan (OPSUMIT) 10 MG TABS Take 1 tablet (10 mg total) by mouth daily. 90 tablet 3  . Multiple Vitamins-Minerals (MULTIVITAMIN WITH MINERALS) tablet Take 1 tablet by mouth daily.     . Probiotic Product (4X PROBIOTIC) TABS Take 1 tablet by mouth daily.    . Vitamin D, Ergocalciferol, (DRISDOL) 50000 units CAPS capsule Take 1 capsule (50,000 Units total) every  7 (seven) days by mouth. 4 capsule 1   No current facility-administered medications on file prior to visit.     PAST MEDICAL HISTORY: Past Medical History:  Diagnosis Date  . Anemia    as child in first grade  . Anxiety   . Arthritis   . Back pain   . Constipation   . CREST syndrome (HCC)   . Dyspnea   . Family history of adverse reaction to anesthesia    mom has had problems in past  . Gallbladder problem   . GERD (gastroesophageal reflux disease)   . Gestational diabetes mellitus in childbirth, diet controlled 1987  . IBS (irritable bowel syndrome)   . Joint pain   . Leg edema   . Menopause   .  Osteoarthritis (arthritis due to wear and tear of joints)   . PAH (pulmonary artery hypertension) (HCC)   . Pulmonary fibrosis (HCC)   . Raynaud disease    hands-toes  . Scleroderma (HCC)   . Shortness of breath dyspnea    pulmonary fibrosis-scleraderma  . Sleep apnea   . Swallowing difficulty   . Thyroid cyst   . Vitamin D deficiency   . Wears glasses     PAST SURGICAL HISTORY: Past Surgical History:  Procedure Laterality Date  . CARDIAC CATHETERIZATION N/A 11/25/2015   Procedure: Right Heart Cath;  Surgeon: Dolores Patty, MD;  Location: [facility] INVASIVE CV LAB;  Service: Cardiovascular;  Laterality: N/A;  . CESAREAN SECTION  87,95  . CHOLECYSTECTOMY  1996   lap choli  . ESOPHAGOGASTRODUODENOSCOPY (EGD) WITH PROPOFOL N/A 02/12/2017   Procedure: ESOPHAGOGASTRODUODENOSCOPY (EGD) WITH PROPOFOL;  Surgeon: Vida Rigger, MD;  Location: [facility];  Service: Endoscopy;  Laterality: N/A;  . HIATAL HERNIA REPAIR  07/26/2015   Procedure: LAPAROSCOPIC REPAIR OF HIATAL HERNIA;  Surgeon: Luretha Murphy, MD;  Location: [facility] ORS;  Service: General;;  . LAPAROSCOPIC GASTRIC SLEEVE RESECTION N/A 07/26/2015   Procedure: LAPAROSCOPIC GASTRIC SLEEVE RESECTION;  Surgeon: Luretha Murphy, MD;  Location: [facility];  Service: General;  Laterality: N/A;  . LEFT AND RIGHT HEART CATHETERIZATION WITH CORONARY ANGIOGRAM N/A 03/14/2015   Procedure: LEFT AND RIGHT HEART CATHETERIZATION WITH CORONARY ANGIOGRAM;  Surgeon: Peter M Swaziland, MD;  Location: [facility] CATH LAB;  Service: Cardiovascular;  Laterality: N/A;  . RADIAL HEAD ARTHROPLASTY Right 07/28/2014   Procedure: RIGHT RADIAL HEAD REPLACEMENT;  Surgeon: Dairl Ponder, MD;  Location: [facility];  Service: Orthopedics;  Laterality: Right;  . RIGHT HEART CATH N/A 03/18/2017   Procedure: Right Heart Cath;  Surgeon: Dolores Patty, MD;  Location: [facility] INVASIVE CV LAB;  Service: Cardiovascular;  Laterality: N/A;  . SAVORY DILATION N/A 02/12/2017   Procedure:  SAVORY DILATION;  Surgeon: Vida Rigger, MD;  Location: [facility];  Service: Endoscopy;  Laterality: N/A;  . TUBAL LIGATION    . UPPER GI ENDOSCOPY  07/26/2015   Procedure: UPPER GI ENDOSCOPY;  Surgeon: Luretha Murphy, MD;  Location: [facility];  Service: General;;  . VAGINAL HYSTERECTOMY  2010   LAVH    SOCIAL HISTORY: Social History   Tobacco Use  . Smoking status: Never Smoker  . Smokeless tobacco: Never Used  Substance Use Topics  . Alcohol use: Yes    Alcohol/week: 0.0 oz    Comment: occ  . Drug use: No    FAMILY HISTORY: Family History  Problem Relation Age of Onset  . Heart failure Mother        Stent put in 2014 for  blockage  . COPD Mother   . Hypertension Mother   . Hyperlipidemia Mother   . Heart disease Mother   . Thyroid disease Mother   . Cancer Mother   . Depression Mother   . Anxiety disorder Mother   . Heart disease Father   . Heart disease Son        Repair ASD  . Neuropathy Neg Hx     ROS: Review of Systems  Cardiovascular: Negative for chest pain.  Gastrointestinal: Negative for nausea and vomiting.  Musculoskeletal:       Negative muscle weakness  Psychiatric/Behavioral: Positive for depression. Negative for suicidal ideas.    PHYSICAL EXAM: There were no vitals taken for this visit. There is no height or weight on file to calculate BMI. Physical Exam  Constitutional: She is oriented to person, place, and time. She appears well-developed and well-nourished.  Cardiovascular: Normal rate.  Pulmonary/Chest: Effort normal.  Musculoskeletal: Normal range of motion.  Neurological: She is oriented to person, place, and time.  Skin: Skin is warm and dry.  Psychiatric: She has a normal mood and affect. Her behavior is normal.  Vitals reviewed.   RECENT LABS AND TESTS: BMET    Component Value Date/Time   NA 142 09/10/2017 1108   K 4.3 09/10/2017 1108   CL 103 09/10/2017 1108   CO2 25 09/10/2017 1108   GLUCOSE 81 09/10/2017 1108   GLUCOSE  106 (H) 03/13/2017 1453   BUN 14 09/10/2017 1108   CREATININE 0.65 09/10/2017 1108   CREATININE 0.75 03/08/2015 1501   CALCIUM 9.2 09/10/2017 1108   GFRNONAA 102 09/10/2017 1108   GFRNONAA >89 03/08/2015 1501   GFRAA 118 09/10/2017 1108   GFRAA >89 03/08/2015 1501   Lab Results  Component Value Date   HGBA1C 5.0 09/10/2017   Lab Results  Component Value Date   INSULIN 6.1 09/10/2017   CBC    Component Value Date/Time   WBC 6.7 09/10/2017 1108   WBC 7.3 03/13/2017 1453   RBC 4.60 09/10/2017 1108   RBC 4.55 03/13/2017 1453   HGB 13.9 09/10/2017 1108   HCT 41.6 09/10/2017 1108   PLT 235 03/13/2017 1453   MCV 90 09/10/2017 1108   MCH 30.2 09/10/2017 1108   MCH 30.1 03/13/2017 1453   MCHC 33.4 09/10/2017 1108   MCHC 33.5 03/13/2017 1453   RDW 14.1 09/10/2017 1108   LYMPHSABS 2.3 09/10/2017 1108   MONOABS 0.9 07/28/2015 0455   EOSABS 0.2 09/10/2017 1108   BASOSABS 0.0 09/10/2017 1108   Iron/TIBC/Ferritin/ %Sat No results found for: IRON, TIBC, FERRITIN, IRONPCTSAT Lipid Panel     Component Value Date/Time   CHOL 151 09/10/2017 1108   TRIG 102 09/10/2017 1108   HDL 70 09/10/2017 1108   LDLCALC 61 09/10/2017 1108   Hepatic Function Panel     Component Value Date/Time   PROT 6.5 09/10/2017 1108   ALBUMIN 4.4 09/10/2017 1108   AST 23 09/10/2017 1108   ALT 17 09/10/2017 1108   ALKPHOS 90 09/10/2017 1108   BILITOT 0.5 09/10/2017 1108      Component Value Date/Time   TSH 2.690 09/10/2017 1108    ASSESSMENT AND PLAN: Other depression - with emotional eating - Plan: buPROPion (WELLBUTRIN SR) 150 MG 12 hr tablet  At risk for heart disease  Class 2 severe obesity with serious comorbidity and body mass index (BMI) of 35.0 to 35.9 in adult, unspecified obesity type (HCC)  PLAN:  Vitamin D Deficiency Rachael Camacho was  informed that low vitamin D levels contributes to fatigue and are associated with obesity, breast, and colon cancer. She agrees to continue to take  prescription Vit D @50 ,000 IU every week. We will recheck labs next month and will follow up for routine testing of vitamin D, at least 2-3 times per year. She was informed of the risk of over-replacement of vitamin D and agrees to not increase her dose unless he discusses this with [place] first.  Cardiovascular risk counseling Yeira was given extended (30 minutes) coronary artery disease prevention counseling today. She is 51 y.o. female and has risk factors for heart disease including obesity. We discussed intensive lifestyle modifications today with an emphasis on specific weight loss instructions and strategies. Pt was also informed of the importance of increasing exercise and decreasing saturated fats to help prevent heart disease.  Depression with Emotional Eating Behaviors We discussed behavior modification techniques today to help Jayah deal with her emotional eating and depression. She has agreed to start Wellbutrin SR 150 mg qam #30 with no refills and will follow up as directed.  I, [place] Crane, am acting as transcriptionist for Quillian Quince, MD  I have reviewed the above documentation for accuracy and completeness, and I agree with the above. -Quillian Quince, MD

## 2017-11-25 ENCOUNTER — Ambulatory Visit (INDEPENDENT_AMBULATORY_CARE_PROVIDER_SITE_OTHER): Payer: BLUE CROSS/BLUE SHIELD | Admitting: Family Medicine

## 2017-11-25 ENCOUNTER — Telehealth: Payer: Self-pay | Admitting: Neurology

## 2017-11-25 ENCOUNTER — Other Ambulatory Visit: Payer: Self-pay | Admitting: Neurology

## 2017-11-25 VITALS — BP 105/71 | HR 63 | Temp 97.7°F | Ht 64.0 in | Wt 203.0 lb

## 2017-11-25 DIAGNOSIS — F3289 Other specified depressive episodes: Secondary | ICD-10-CM | POA: Diagnosis not present

## 2017-11-25 DIAGNOSIS — G9589 Other specified diseases of spinal cord: Secondary | ICD-10-CM

## 2017-11-25 DIAGNOSIS — Z6835 Body mass index (BMI) 35.0-35.9, adult: Secondary | ICD-10-CM | POA: Diagnosis not present

## 2017-11-25 DIAGNOSIS — E559 Vitamin D deficiency, unspecified: Secondary | ICD-10-CM

## 2017-11-25 DIAGNOSIS — M532X1 Spinal instabilities, occipito-atlanto-axial region: Secondary | ICD-10-CM

## 2017-11-25 MED ORDER — GABAPENTIN 100 MG PO CAPS: 100.0000 mg | ORAL_CAPSULE | Freq: Three times a day (TID) | ORAL | 6 refills | Status: DC

## 2017-11-25 MED ORDER — VITAMIN D (ERGOCALCIFEROL) 1.25 MG (50000 UNIT) PO CAPS: 50000.0000 [IU] | ORAL_CAPSULE | ORAL | 1 refills | Status: DC

## 2017-11-25 NOTE — Progress Notes (Signed)
Office: [phone]  /  Fax: [phone]   HPI:   Chief Complaint: OBESITY Rachael Camacho is here to discuss her progress with her obesity treatment plan. She is on the Category 2 plan and is following her eating plan approximately 80 % of the time. She states she is exercising 0 minutes 0 times per week. Cali continues to lose weight slowly, even over Thanksgiving. She did well with controlling her portions and making smarter food choices. She is concerned about upcoming holiday eating. Her weight is 203 lb (92.1 kg) today and has had a weight loss of 1 pound over a period of 2 weeks since her last visit. She has lost 4 lbs since starting treatment with [place].  Vitamin D deficiency Rachael Camacho has a diagnosis of vitamin D deficiency. She is currently taking vit D and fatigue was improving before she started Gabapentin. She denies nausea, vomiting or muscle weakness.  Depression with emotional eating behaviors Rachael Camacho feels she has decreased cravings on Wellbutrin. Her mood is good and she has no worsening insomnia. Rachael Camacho struggles with emotional eating and using food for comfort to the extent that it is negatively impacting her health. She often snacks when she is not hungry. Rachael Camacho sometimes feels she is out of control and then feels guilty that she made poor food choices. She has been working on behavior modification techniques to help reduce her emotional eating and has been somewhat successful. She shows no sign of suicidal or homicidal ideations.  Depression screen Rachael Camacho 2/9 09/10/2017 10/18/2016 07/18/2016 05/02/2016 03/01/2016  Decreased Interest 2 0 0 0 0  Down, Depressed, Hopeless 2 0 0 0 0  PHQ - 2 Score 4 0 0 0 0  Altered sleeping 1 - - - -  Tired, decreased energy 3 - - - -  Change in appetite 1 - - - -  Feeling bad or failure about yourself  3 - - - -  Trouble concentrating 2 - - - -  Moving slowly or fidgety/restless 2 - - - -  Suicidal thoughts 0 - - - -  PHQ-9 Score 16 - - - -    Difficult doing work/chores Somewhat difficult - - - -      ALLERGIES: Allergies  Allergen Reactions   Amoxicillin Other (See Comments)    Immediate yeast infection  Has patient had a PCN reaction causing immediate rash, facial/tongue/throat swelling, SOB or lightheadedness with hypotension: No Has patient had a PCN reaction causing severe rash involving mucus membranes or skin necrosis: No Has patient had a PCN reaction that required hospitalization: No Has patient had a PCN reaction occurring within the last 10 years: No If all of the above answers are "NO", then may proceed with Cephalosporin use.    Percocet [Oxycodone-Acetaminophen] Itching    Tolerates acetaminophen alone   [name] [Nitrofurantoin Monohyd Macro] Rash and Hives   [name] Hives and Rash    Reported chicken pox-like rash, not hives   Sulfa Antibiotics Rash and Hives    MEDICATIONS: Current Outpatient Medications on File Prior to Visit  Medication Sig Dispense Refill   B-12, Methylcobalamin, 1000 MCG SUBL Place 1,000 mcg under the tongue daily.      baclofen (LIORESAL) 10 MG tablet Take 1 tablet (10 mg total) by mouth 3 (three) times daily. 90 each 11   BIOTIN PO Take 1 tablet by mouth daily.     buPROPion (WELLBUTRIN SR) 150 MG 12 hr tablet Take 1 tablet (150 mg total) by mouth daily. 30  tablet 0   desloratadine (CLARINEX) 5 MG tablet Take 5 mg by mouth at bedtime as needed (allergies.).   12   fluticasone (FLONASE) 50 MCG/ACT nasal spray Place 1 spray into both nostrils at bedtime as needed for allergies or rhinitis.      furosemide (LASIX) 20 MG tablet TAKE 1 TABLET (20 MG TOTAL) BY MOUTH DAILY. 90 tablet 3   gabapentin (NEURONTIN) 300 MG capsule Take 1 capsule (300 mg total) by mouth 3 (three) times daily. 90 capsule 11   Macitentan (OPSUMIT) 10 MG TABS Take 1 tablet (10 mg total) by mouth daily. 90 tablet 3   Multiple Vitamins-Minerals (MULTIVITAMIN WITH MINERALS) tablet Take 1 tablet  by mouth daily.      Probiotic Product (4X PROBIOTIC) TABS Take 1 tablet by mouth daily.     No current facility-administered medications on file prior to visit.     PAST MEDICAL HISTORY: Past Medical History:  Diagnosis Date   Anemia    as child in first grade   Anxiety    Arthritis    Back pain    Constipation    CREST syndrome (HCC)    Dyspnea    Family history of adverse reaction to anesthesia    mom has had problems in past   Gallbladder problem    GERD (gastroesophageal reflux disease)    Gestational diabetes mellitus in childbirth, diet controlled 1987   IBS (irritable bowel syndrome)    Joint pain    Leg edema    Menopause    Osteoarthritis (arthritis due to wear and tear of joints)    PAH (pulmonary artery hypertension) (HCC)    Pulmonary fibrosis (HCC)    Raynaud disease    hands-toes   Scleroderma (HCC)    Shortness of breath dyspnea    pulmonary fibrosis-scleraderma   Sleep apnea    Swallowing difficulty    Thyroid cyst    Vitamin D deficiency    Wears glasses     PAST SURGICAL HISTORY: Past Surgical History:  Procedure Laterality Date   CARDIAC CATHETERIZATION N/A 11/25/2015   Procedure: Right Heart Cath;  Surgeon: Dolores Patty, MD;  Location: [facility] INVASIVE CV LAB;  Service: Cardiovascular;  Laterality: N/A;   CESAREAN SECTION  87,95   CHOLECYSTECTOMY  1996   lap choli   ESOPHAGOGASTRODUODENOSCOPY (EGD) WITH PROPOFOL N/A 02/12/2017   Procedure: ESOPHAGOGASTRODUODENOSCOPY (EGD) WITH PROPOFOL;  Surgeon: Vida Rigger, MD;  Location: [facility];  Service: Endoscopy;  Laterality: N/A;   HIATAL HERNIA REPAIR  07/26/2015   Procedure: LAPAROSCOPIC REPAIR OF HIATAL HERNIA;  Surgeon: Luretha Murphy, MD;  Location: [facility] OR[facility];  Service: General;;   LAPAROSCOPIC GASTRIC SLEEVE RESECTION N/A 07/26/2015   Procedure: LAPAROSCOPIC GASTRIC SLEEVE RESECTION;  Surgeon: Luretha Murphy, MD;  Location: [facility];  Service: General;  Laterality:  N/A;   LEFT AND RIGHT HEART CATHETERIZATION WITH CORONARY ANGIOGRAM N/A 03/14/2015   Procedure: LEFT AND RIGHT HEART CATHETERIZATION WITH CORONARY ANGIOGRAM;  Surgeon: Peter M Swaziland, MD;  Location: [facility] CATH LAB;  Service: Cardiovascular;  Laterality: N/A;   RADIAL HEAD ARTHROPLASTY Right 07/28/2014   Procedure: RIGHT RADIAL HEAD REPLACEMENT;  Surgeon: Dairl Ponder, MD;  Location: [facility];  Service: Orthopedics;  Laterality: Right;   RIGHT HEART CATH N/A 03/18/2017   Procedure: Right Heart Cath;  Surgeon: Dolores Patty, MD;  Location[facility] INVASIVE CV LAB;  Service: Cardiovascular;  Laterality: N/A;   SAVORY DILATION N/A 02/12/2017   Procedure: SAVORY DILATION;  Surgeon:  Vida RiggerMarc Magod, MD;  [facility];  Service: Endoscopy;  Laterality: N/A;   TUBAL LIGATION     UPPER GI ENDOSCOPY  07/26/2015   Procedure: UPPER GI ENDOSCOPY;  Surgeon: Luretha MurphyMatthew Stagner, MD;  Location: [facility];  Service: General;;   VAGINAL HYSTERECTOMY  2010   LAVH    SOCIAL HISTORY: Social History   Tobacco Use   Smoking status: Never Smoker   Smokeless tobacco: Never Used  Substance Use Topics   Alcohol use: Yes    Alcohol/week: 0.0 oz    Comment: occ   Drug use: No    FAMILY HISTORY: Family History  Problem Relation Age of Onset   Heart failure Mother        Stent put in 2014 for blockage   COPD Mother    Hypertension Mother    Hyperlipidemia Mother    Heart disease Mother    Thyroid disease Mother    Cancer Mother    Depression Mother    Anxiety disorder Mother    Heart disease Father    Heart disease Son        Repair ASD   Neuropathy Neg Hx     ROS: Review of Systems  Constitutional: Positive for malaise/fatigue and weight loss.  Gastrointestinal: Negative for nausea and vomiting.  Musculoskeletal:       Negative muscle weakness  Psychiatric/Behavioral: Positive for depression. Negative for suicidal ideas. The patient has insomnia.     PHYSICAL  EXAM: Blood pressure 105/71, pulse 63, temperature 97.7 F (36.5 C), temperature source Oral, height 5\' 4"  (1.626 m), weight 203 lb (92.1 kg), SpO2 93 %. Body mass index is 34.84 kg/m. Physical Exam  Constitutional: She is oriented to person, place, and time. She appears well-developed and well-nourished.  Cardiovascular: Normal rate.  Pulmonary/Chest: Effort normal.  Musculoskeletal: Normal range of motion.  Neurological: She is oriented to person, place, and time.  Skin: Skin is warm and dry.  Psychiatric: She has a normal mood and affect. Her behavior is normal.  Vitals reviewed.   RECENT LABS AND TESTS: BMET    Component Value Date/Time   NA 142 09/10/2017 1108   K 4.3 09/10/2017 1108   CL 103 09/10/2017 1108   CO2 25 09/10/2017 1108   GLUCOSE 81 09/10/2017 1108   GLUCOSE 106 (H) 03/13/2017 1453   BUN 14 09/10/2017 1108   CREATININE 0.65 09/10/2017 1108   CREATININE 0.75 03/08/2015 1501   CALCIUM 9.2 09/10/2017 1108   GFRNONAA 102 09/10/2017 1108   GFRNONAA >89 03/08/2015 1501   GFRAA 118 09/10/2017 1108   GFRAA >89 03/08/2015 1501   Lab Results  Component Value Date   HGBA1C 5.0 09/10/2017   Lab Results  Component Value Date   INSULIN 6.1 09/10/2017   CBC    Component Value Date/Time   WBC 6.7 09/10/2017 1108   WBC 7.3 03/13/2017 1453   RBC 4.60 09/10/2017 1108   RBC 4.55 03/13/2017 1453   HGB 13.9 09/10/2017 1108   HCT 41.6 09/10/2017 1108   PLT 235 03/13/2017 1453   MCV 90 09/10/2017 1108   MCH 30.2 09/10/2017 1108   MCH 30.1 03/13/2017 1453   MCHC 33.4 09/10/2017 1108   MCHC 33.5 03/13/2017 1453   RDW 14.1 09/10/2017 1108   LYMPHSABS 2.3 09/10/2017 1108   MONOABS 0.9 07/28/2015 0455   EOSABS 0.2 09/10/2017 1108   BASOSABS 0.0 09/10/2017 1108   Iron/TIBC/Ferritin/ %Sat No results found for: IRON, TIBC, FERRITIN, IRONPCTSAT Lipid Panel  Component Value Date/Time   CHOL 151 09/10/2017 1108   TRIG 102 09/10/2017 1108   HDL 70 09/10/2017  1108   LDLCALC 61 09/10/2017 1108   Hepatic Function Panel     Component Value Date/Time   PROT 6.5 09/10/2017 1108   ALBUMIN 4.4 09/10/2017 1108   AST 23 09/10/2017 1108   ALT 17 09/10/2017 1108   ALKPHOS 90 09/10/2017 1108   BILITOT 0.5 09/10/2017 1108      Component Value Date/Time   TSH 2.690 09/10/2017 1108    ASSESSMENT AND PLAN: Vitamin D deficiency - Plan: Vitamin D, Ergocalciferol, (DRISDOL) 50000 units CAPS capsule  Other depression - with emotional eating  Class 2 severe obesity with serious comorbidity and body mass index (BMI) of 35.0 to 35.9 in adult, unspecified obesity type (HCC)  PLAN:  Vitamin D Deficiency Rachael Camacho was informed that low vitamin D levels contributes to fatigue and are associated with obesity, breast, and colon cancer. She agrees to continue to take prescription Vit D @50 ,000 IU every week #4 with no refills. We will recheck labs at her next visit and will follow up for routine testing of vitamin D, at least 2-3 times per year. She was informed of the risk of over-replacement of vitamin D and agrees to not increase her dose unless he discusses this with [place] first. Rachael Camacho agrees to follow up with our clinic in 2 weeks.  Depression with Emotional Eating Behaviors We discussed behavior modification techniques today to help Rachael Camacho deal with her emotional eating and depression. She has agreed to continue Wellbutrin SR 150 mg qd and will work on identifying emotional eating. Katanya agreed to follow up as directed.  Obesity Rachael Camacho is currently in the action stage of change. As such, her goal is to continue with weight loss efforts She has agreed to follow the Category 2 plan Rachael Camacho has been instructed to work up to a goal of 150 minutes of combined cardio and strengthening exercise per week for weight loss and overall health benefits. We discussed the following Behavioral Modification Strategies today: no skipping meals, work on meal planning and easy  cooking plans, dealing with family or coworker sabotage and holiday eating strategies   Rachael Camacho has agreed to follow up with our clinic in 2 weeks fasting. She was informed of the importance of frequent follow up visits to maximize her success with intensive lifestyle modifications for her multiple health conditions.  I, Rachael Camacho, am acting as transcriptionist for Rachael Quince, MD  I have reviewed the above documentation for accuracy and completeness, and I agree with the above. -Rachael Quince, MD    OBESITY BEHAVIORAL INTERVENTION VISIT  Today's visit was # 5 out of 22.  Starting weight: 207 lbs Starting date: 09/10/17 Today's weight : 203 lbs Today's date: 11/25/2017 Total lbs lost to date: 4 (Patients must lose 7 lbs in the first 6 months to continue with counseling)   ASK: We discussed the diagnosis of obesity with Rachael Camacho today and Rachael Camacho agreed to give [place] permission to discuss obesity behavioral modification therapy today.  ASSESS: Rachael Camacho has the diagnosis of obesity and her BMI today is 34.83 Rachael Camacho is in the action stage of change   ADVISE: Rachael Camacho was educated on the multiple health risks of obesity as well as the benefit of weight loss to improve her health. She was advised of the need for long term treatment and the importance of lifestyle modifications.  AGREE: Multiple dietary modification options and treatment options  were discussed and  Rachael Camacho agreed to follow the Category 2 plan We discussed the following Behavioral Modification Strategies today: no skipping meals, work on meal planning and easy cooking plans, dealing with family or coworker sabotage and holiday eating strategies

## 2017-11-25 NOTE — Progress Notes (Signed)
Ordered [facility] eval

## 2017-11-26 ENCOUNTER — Ambulatory Visit
Admission: RE | Admit: 2017-11-26 | Discharge: 2017-11-26 | Disposition: A | Discharge status: Home / Self Care | Payer: BLUE CROSS/BLUE SHIELD | Source: Ambulatory Visit | Attending: Endocrinology | Admitting: Endocrinology

## 2017-11-26 ENCOUNTER — Telehealth: Payer: Self-pay | Admitting: Neurology

## 2017-11-26 DIAGNOSIS — E049 Nontoxic goiter, unspecified: Secondary | ICD-10-CM

## 2017-11-26 NOTE — Telephone Encounter (Signed)
Sent to [facility] telephone [phone] - fax [phone] I have requested patient be seen in two weeks. I have called [facility] MRI CD will be sent via courier 11/27/2017 to [facility]. I have talked to patient she is aware and to be be on the look out for telephone call.

## 2017-11-26 NOTE — Telephone Encounter (Signed)
Referred to [facility]

## 2017-12-10 ENCOUNTER — Ambulatory Visit (INDEPENDENT_AMBULATORY_CARE_PROVIDER_SITE_OTHER): Payer: BLUE CROSS/BLUE SHIELD | Admitting: Family Medicine

## 2017-12-10 VITALS — BP 114/77 | HR 61 | Temp 97.9°F | Ht 64.0 in | Wt 205.0 lb

## 2017-12-10 DIAGNOSIS — E8881 Metabolic syndrome: Secondary | ICD-10-CM

## 2017-12-10 DIAGNOSIS — F3289 Other specified depressive episodes: Secondary | ICD-10-CM

## 2017-12-10 DIAGNOSIS — I890 Lymphedema, not elsewhere classified: Secondary | ICD-10-CM | POA: Diagnosis not present

## 2017-12-10 DIAGNOSIS — Z6835 Body mass index (BMI) 35.0-35.9, adult: Secondary | ICD-10-CM | POA: Diagnosis not present

## 2017-12-10 DIAGNOSIS — E559 Vitamin D deficiency, unspecified: Secondary | ICD-10-CM | POA: Diagnosis not present

## 2017-12-10 MED ORDER — BUPROPION HCL ER (SR) 200 MG PO TB12: 200.0000 mg | ORAL_TABLET | Freq: Every day | ORAL | 0 refills | Status: DC

## 2017-12-10 NOTE — Progress Notes (Signed)
Office: [phone]  /  Fax: [phone]   HPI:   Chief Complaint: OBESITY Rachael Camacho is here to discuss her progress with her obesity treatment plan. She is on the Category 2 plan and is following her eating plan approximately 50 % of the time. She states she is exercising 0 minutes 0 times per week. Rachael Camacho has struggled with increased emotional eating with increased health stress recently. She is not journaling or following the category 2 plan. Her weight is 205 lb (93 kg) today and has had a weight gain of 2 pounds over a period of 2 weeks since her last visit. She has lost 2 lbs since starting treatment with [place].  Lymphedema bilateral lower extremities (1+) [name] has chronic mildly pitting bilateral lower extremity edema, which appears to not be cardiac related. She has no epidermal discoloration. She denies shortness of breath and orthopnea.  Vitamin D deficiency Rachael Camacho has a diagnosis of vitamin D deficiency. She is on prescription vit D and is not yet at goal. Rachael Camacho denies nausea, vomiting or muscle weakness.  Insulin Resistance Rachael Camacho has a diagnosis of insulin resistance based on her elevated fasting insulin level >5. She is greatly improved with diet.   Although Rachael Camacho's blood glucose readings are still under good control, insulin resistance puts her at greater risk of metabolic syndrome and diabetes. She denies nausea, vomiting or hypoglycemia. Rachael Camacho continues to work on diet and exercise to decrease risk of diabetes. Rachael Camacho is due for labs.  Depression with emotional eating behaviors Rachael Camacho is on Wellbutrin and feels her mood is slightly improved, but not enough. Rachael Camacho struggles with emotional eating and using food for comfort to the extent that it is negatively impacting her health. She often snacks when she is not hungry. Rachael Camacho sometimes feels she is out of control and then feels guilty that she made poor food choices. She has been working on behavior modification techniques  to help reduce her emotional eating and has been somewhat successful. She shows no sign of suicidal or homicidal ideations.  Depression screen [facility] 2/9 09/10/2017 10/18/2016 07/18/2016 05/02/2016 03/01/2016  Decreased Interest 2 0 0 0 0  Down, Depressed, Hopeless 2 0 0 0 0  PHQ - 2 Score 4 0 0 0 0  Altered sleeping 1 - - - -  Tired, decreased energy 3 - - - -  Change in appetite 1 - - - -  Feeling bad or failure about yourself  3 - - - -  Trouble concentrating 2 - - - -  Moving slowly or fidgety/restless 2 - - - -  Suicidal thoughts 0 - - - -  PHQ-9 Score 16 - - - -  Difficult doing work/chores Somewhat difficult - - - -     ALLERGIES: Allergies  Allergen Reactions  . Amoxicillin Other (See Comments)    Immediate yeast infection  Has patient had a PCN reaction causing immediate rash, facial/tongue/throat swelling, SOB or lightheadedness with hypotension: No Has patient had a PCN reaction causing severe rash involving mucus membranes or skin necrosis: No Has patient had a PCN reaction that required hospitalization: No Has patient had a PCN reaction occurring within the last 10 years: No If all of the above answers are "NO", then may proceed with Cephalosporin use.   [name] Percocet [Oxycodone-Acetaminophen] Itching    Tolerates acetaminophen alone  . [name] [Nitrofurantoin Monohyd Macro] Rash and Hives  . [name] Hives and Rash    Reported chicken pox-like rash, not hives  . Sulfa  Antibiotics Rash and Hives    MEDICATIONS: Current Outpatient Medications on File Prior to Visit  Medication Sig Dispense Refill  . B-12, Methylcobalamin, 1000 MCG SUBL Place 1,000 mcg under the tongue daily.     . baclofen (LIORESAL) 10 MG tablet Take 1 tablet (10 mg total) by mouth 3 (three) times daily. 90 each 11  . BIOTIN PO Take 1 tablet by mouth daily.    [name] desloratadine (CLARINEX) 5 MG tablet Take 5 mg by mouth at bedtime as needed (allergies.).   12  . fluticasone (FLONASE) 50 MCG/ACT nasal  spray Place 1 spray into both nostrils at bedtime as needed for allergies or rhinitis.     . furosemide (LASIX) 20 MG tablet TAKE 1 TABLET (20 MG TOTAL) BY MOUTH DAILY. 90 tablet 3  . gabapentin (NEURONTIN) 100 MG capsule Take 1 capsule (100 mg total) by mouth 3 (three) times daily. 90 capsule 6  . Macitentan (OPSUMIT) 10 MG TABS Take 1 tablet (10 mg total) by mouth daily. 90 tablet 3  . Multiple Vitamins-Minerals (MULTIVITAMIN WITH MINERALS) tablet Take 1 tablet by mouth daily.     . Probiotic Product (4X PROBIOTIC) TABS Take 1 tablet by mouth daily.    . Vitamin D, Ergocalciferol, (DRISDOL) 50000 units CAPS capsule Take 1 capsule (50,000 Units total) by mouth every 7 (seven) days. 4 capsule 1   No current facility-administered medications on file prior to visit.     PAST MEDICAL HISTORY: Past Medical History:  Diagnosis Date  . Anemia    as child in first grade  . Anxiety   . Arthritis   . Back pain   . Constipation   . CREST syndrome (HCC)   . Dyspnea   . Family history of adverse reaction to anesthesia    mom has had problems in past  . Gallbladder problem   . GERD (gastroesophageal reflux disease)   . Gestational diabetes mellitus in childbirth, diet controlled 1987  . IBS (irritable bowel syndrome)   . Joint pain   . Leg edema   . Menopause   . Osteoarthritis (arthritis due to wear and tear of joints)   . PAH (pulmonary artery hypertension) (HCC)   . Pulmonary fibrosis (HCC)   . Raynaud disease    hands-toes  . Scleroderma (HCC)   . Shortness of breath dyspnea    pulmonary fibrosis-scleraderma  . Sleep apnea   . Swallowing difficulty   . Thyroid cyst   . Vitamin D deficiency   . Wears glasses     PAST SURGICAL HISTORY: Past Surgical History:  Procedure Laterality Date  . CARDIAC CATHETERIZATION N/A 11/25/2015   Procedure: Right Heart Cath;  Surgeon: Dolores Patty, MD;  Location: [facility] INVASIVE CV LAB;  Service: Cardiovascular;  Laterality: N/A;  . CESAREAN  SECTION  87,95  . CHOLECYSTECTOMY  1996   lap choli  . ESOPHAGOGASTRODUODENOSCOPY (EGD) WITH PROPOFOL N/A 02/12/2017   Procedure: ESOPHAGOGASTRODUODENOSCOPY (EGD) WITH PROPOFOL;  Surgeon: Vida Rigger, MD;  Location: [facility] ENDOS[facility];  Service: Endoscopy;  Laterality: N/A;  . HIATAL HERNIA REPAIR  07/26/2015   Procedure: LAPAROSCOPIC REPAIR OF HIATAL HERNIA;  Surgeon: Luretha Murphy, MD;  Location: [facility] ORS;  Service: General;;  . LAPAROSCOPIC GASTRIC SLEEVE RESECTION N/A 07/26/2015   Procedure: LAPAROSCOPIC GASTRIC SLEEVE RESECTION;  Surgeon: Luretha Murphy, MD;  Location: [facility];  Service: General;  Laterality: N/A;  . LEFT AND RIGHT HEART CATHETERIZATION WITH CORONARY ANGIOGRAM N/A 03/14/2015   Procedure: LEFT AND RIGHT HEART CATHETERIZATION  WITH CORONARY ANGIOGRAM;  Surgeon: Peter M SwazilandJordan, MD;  [facility];  Service: Cardiovascular;  Laterality: N/A;  . RADIAL HEAD ARTHROPLASTY Right 07/28/2014   Procedure: RIGHT RADIAL HEAD REPLACEMENT;  Surgeon: Dairl PonderMatthew Weingold, MD;  Location: [facility];  Service: Orthopedics;  Laterality: Right;  . RIGHT HEART CATH N/A 03/18/2017   Procedure: Right Heart Cath;  Surgeon: Dolores Pattyaniel R Bensimhon, MD;  [facility];  Service: Cardiovascular;  Laterality: N/A;  . SAVORY DILATION N/A 02/12/2017   Procedure: SAVORY DILATION;  Surgeon: Vida RiggerMarc Magod, MD;  Location: [facility];  Service: Endoscopy;  Laterality: N/A;  . TUBAL LIGATION    . UPPER GI ENDOSCOPY  07/26/2015   Procedure: UPPER GI ENDOSCOPY;  Surgeon: Luretha MurphyMatthew Hagadorn, MD;  Location: [facility];  Service: General;;  . VAGINAL HYSTERECTOMY  2010   [facility]    SOCIAL HISTORY: Social History   Tobacco Use  . Smoking status: Never Smoker  . Smokeless tobacco: Never Used  Substance Use Topics  . Alcohol use: Yes    Alcohol/week: 0.0 oz    Comment: occ  . Drug use: No    FAMILY HISTORY: Family History  Problem Relation Age of Onset  . Heart failure Mother        Stent put in 2014  for blockage  . COPD Mother   . Hypertension Mother   . Hyperlipidemia Mother   . Heart disease Mother   . Thyroid disease Mother   . Cancer Mother   . Depression Mother   . Anxiety disorder Mother   . Heart disease Father   . Heart disease Son        Repair ASD  . Neuropathy Neg Hx     ROS: Review of Systems  Constitutional: Negative for weight loss.  Respiratory: Negative for shortness of breath.   Cardiovascular: Negative for orthopnea.  Gastrointestinal: Negative for nausea and vomiting.  Musculoskeletal:       Negative muscle weakness  Skin:       Negative epidermal discoloration  Endo/Heme/Allergies:       Negative hypoglycemia  Psychiatric/Behavioral: Positive for depression. Negative for suicidal ideas.    PHYSICAL EXAM: Blood pressure 114/77, pulse 61, temperature 97.9 F (36.6 C), temperature source Oral, height 5\' 4"  (1.626 m), weight 205 lb (93 kg), SpO2 93 %. Body mass index is 35.19 kg/m. Physical Exam  Constitutional: She is oriented to person, place, and time. She appears well-developed and well-nourished.  Cardiovascular: Normal rate.  Pulmonary/Chest: Effort normal.  Musculoskeletal: She exhibits edema (mildly pitting bilateral lower extremities).  Neurological: She is oriented to person, place, and time.  Skin: Skin is warm and dry.  Vitals reviewed.   RECENT LABS AND TESTS: BMET    Component Value Date/Time   NA 142 09/10/2017 1108   K 4.3 09/10/2017 1108   CL 103 09/10/2017 1108   CO2 25 09/10/2017 1108   GLUCOSE 81 09/10/2017 1108   GLUCOSE 106 (H) 03/13/2017 1453   BUN 14 09/10/2017 1108   CREATININE 0.65 09/10/2017 1108   CREATININE 0.75 03/08/2015 1501   CALCIUM 9.2 09/10/2017 1108   GFRNONAA 102 09/10/2017 1108   GFRNONAA >89 03/08/2015 1501   GFRAA 118 09/10/2017 1108   GFRAA >89 03/08/2015 1501   Lab Results  Component Value Date   HGBA1C 5.0 09/10/2017   Lab Results  Component Value Date   INSULIN 6.1 09/10/2017    CBC    Component Value Date/Time   WBC  6.7 09/10/2017 1108   WBC 7.3 03/13/2017 1453   RBC 4.60 09/10/2017 1108   RBC 4.55 03/13/2017 1453   HGB 13.9 09/10/2017 1108   HCT 41.6 09/10/2017 1108   PLT 235 03/13/2017 1453   MCV 90 09/10/2017 1108   MCH 30.2 09/10/2017 1108   MCH 30.1 03/13/2017 1453   MCHC 33.4 09/10/2017 1108   MCHC 33.5 03/13/2017 1453   RDW 14.1 09/10/2017 1108   LYMPHSABS 2.3 09/10/2017 1108   MONOABS 0.9 07/28/2015 0455   EOSABS 0.2 09/10/2017 1108   BASOSABS 0.0 09/10/2017 1108   Iron/TIBC/Ferritin/ %Sat No results found for: IRON, TIBC, FERRITIN, IRONPCTSAT Lipid Panel     Component Value Date/Time   CHOL 151 09/10/2017 1108   TRIG 102 09/10/2017 1108   HDL 70 09/10/2017 1108   LDLCALC 61 09/10/2017 1108   Hepatic Function Panel     Component Value Date/Time   PROT 6.5 09/10/2017 1108   ALBUMIN 4.4 09/10/2017 1108   AST 23 09/10/2017 1108   ALT 17 09/10/2017 1108   ALKPHOS 90 09/10/2017 1108   BILITOT 0.5 09/10/2017 1108      Component Value Date/Time   TSH 2.690 09/10/2017 1108    ASSESSMENT AND PLAN: Lymphedema - Bilateral 1+  Vitamin D deficiency - Plan: VITAMIN D 25 Hydroxy (Vit-D Deficiency, Fractures)  Insulin resistance - Plan: Comprehensive metabolic panel, Hemoglobin A1c, Insulin, random  Other depression - with emotional eating  Class 2 severe obesity with serious comorbidity and body mass index (BMI) of 35.0 to 35.9 in adult, unspecified obesity type (HCC)  PLAN:  Lymphedema bilateral lower extremities (1+) We will refer to PT for lymphedema treatment and Lafonda agrees to follow up with our clinic in 3 weeks.  Vitamin D Deficiency Taleah was informed that low vitamin D levels contributes to fatigue and are associated with obesity, breast, and colon cancer. She agrees to continue to take prescription Vit D @50 ,000 IU every week. We will check labs and will follow up for routine testing of vitamin D, at least 2-3 times  per year. She was informed of the risk of over-replacement of vitamin D and agrees to not increase her dose unless he discusses this with [place] first.  Insulin Resistance Harini will continue to work on weight loss, exercise, and decreasing simple carbohydrates in her diet to help decrease the risk of diabetes. She was informed that eating too many simple carbohydrates or too many calories at one sitting increases the likelihood of GI side effects. We will check labs and Daniela agreed to follow up with [place] as directed to monitor her progress.  Depression with Emotional Eating Behaviors We discussed behavior modification techniques today to help Lolah deal with her emotional eating and depression. She has agreed to increase Wellbutrin SR to 200 mg qam #30 with no refills and will follow up as directed.  Obesity Jonise is currently in the action stage of change. As such, her goal is to continue with weight loss efforts She has agreed to portion control better and make smarter food choices, such as increase vegetables and decrease simple carbohydrates  Velencia has been instructed to work up to a goal of 150 minutes of combined cardio and strengthening exercise per week for weight loss and overall health benefits. We discussed the following Behavioral Modification Strategies today: increase H2O intake, decreasing sodium intake, dealing with family or coworker sabotage and holiday eating strategies   Yoshika has agreed to follow up with our clinic in 3  weeks. She was informed of the importance of frequent follow up visits to maximize her success with intensive lifestyle modifications for her multiple health conditions.  I, [place] CraneJoanne Murray, am acting as transcriptionist for Quillian Quincearen Ethelda Deangelo, MD  I have reviewed the above documentation for accuracy and completeness, and I agree with the above. -Quillian Quincearen Moorea Boissonneault, MD   OBESITY BEHAVIORAL INTERVENTION VISIT  Today's visit was # 6 out of 22.  Starting weight: 207  lbs Starting date: 09/10/17 Today's weight : 205 lbs Today's date: 12/10/2017 Total lbs lost to date: 2 (Patients must lose 7 lbs in the first 6 months to continue with counseling)   ASK: We discussed the diagnosis of obesity with Burley SaverAngela D Blomgren today and [name] agreed to give us permission to discuss obesity behavioral modification therapy today.  ASSESS: [name] has the diagnosis of obesity and her BMI today is 35.17 [name] is in the action stage of change   ADVISE: [name] was educated on the multiple health risks of obesity as well as the benefit of weight loss to improve her health. She was advised of the need for long term treatment and the importance of lifestyle modifications.  AGREE: Multiple dietary modification options and treatment options were discussed and  [name] agreed to portion control better and make smarter food choices, such as increase vegetables and decrease simple carbohydrates  We discussed the following Behavioral Modification Strategies today: increase H2O intake, decreasing sodium intake, dealing with family or coworker sabotage and holiday eating strategies

## 2017-12-11 LAB — COMPREHENSIVE METABOLIC PANEL
ALBUMIN: 4.2 g/dL (ref 3.5–5.5)
ALK PHOS: 85 IU/L (ref 39–117)
ALT: 15 IU/L (ref 0–32)
AST: 18 IU/L (ref 0–40)
Albumin/Globulin Ratio: 2 (ref 1.2–2.2)
BILIRUBIN TOTAL: 0.5 mg/dL (ref 0.0–1.2)
BUN / CREAT RATIO: 17 (ref 9–23)
BUN: 14 mg/dL (ref 6–24)
CO2: 26 mmol/L (ref 20–29)
CREATININE: 0.81 mg/dL (ref 0.57–1.00)
Calcium: 9.2 mg/dL (ref 8.7–10.2)
Chloride: 107 mmol/L — ABNORMAL HIGH (ref 96–106)
GFR calc non Af Amer: 84 mL/min/{1.73_m2} (ref >59)
GFR, EST AFRICAN AMERICAN: 97 mL/min/{1.73_m2} (ref >59)
GLOBULIN, TOTAL: 2.1 g/dL (ref 1.5–4.5)
GLUCOSE: 85 mg/dL (ref 65–99)
Potassium: 4.2 mmol/L (ref 3.5–5.2)
SODIUM: 143 mmol/L (ref 134–144)
TOTAL PROTEIN: 6.3 g/dL (ref 6.0–8.5)

## 2017-12-11 LAB — HEMOGLOBIN A1C
Est. average glucose Bld gHb Est-mCnc: 94 mg/dL
HEMOGLOBIN A1C: 4.9 % (ref 4.8–5.6)

## 2017-12-11 LAB — VITAMIN D 25 HYDROXY (VIT D DEFICIENCY, FRACTURES): VIT D 25 HYDROXY: 51.3 ng/mL (ref 30.0–100.0)

## 2017-12-11 LAB — INSULIN, RANDOM: INSULIN: 4.4 u[IU]/mL (ref 2.6–24.9)

## 2017-12-19 ENCOUNTER — Other Ambulatory Visit: Payer: Self-pay | Admitting: Neurosurgery

## 2017-12-31 ENCOUNTER — Ambulatory Visit (INDEPENDENT_AMBULATORY_CARE_PROVIDER_SITE_OTHER): Payer: BLUE CROSS/BLUE SHIELD | Admitting: Family Medicine

## 2017-12-31 ENCOUNTER — Encounter: Payer: Self-pay | Admitting: Neurology

## 2017-12-31 VITALS — BP 104/71 | HR 74 | Temp 97.8°F | Ht 64.0 in | Wt 206.0 lb

## 2017-12-31 DIAGNOSIS — Z6835 Body mass index (BMI) 35.0-35.9, adult: Secondary | ICD-10-CM

## 2017-12-31 DIAGNOSIS — F3289 Other specified depressive episodes: Secondary | ICD-10-CM

## 2017-12-31 DIAGNOSIS — E559 Vitamin D deficiency, unspecified: Secondary | ICD-10-CM | POA: Diagnosis not present

## 2017-12-31 MED ORDER — VITAMIN D (ERGOCALCIFEROL) 1.25 MG (50000 UNIT) PO CAPS: 50000.0000 [IU] | ORAL_CAPSULE | ORAL | 1 refills | Status: DC

## 2017-12-31 MED ORDER — BUPROPION HCL ER (SR) 200 MG PO TB12: 200.0000 mg | ORAL_TABLET | Freq: Every day | ORAL | 0 refills | Status: DC

## 2017-12-31 NOTE — Progress Notes (Signed)
Office: [phone]  /  Fax: [phone]   HPI:   Chief Complaint: OBESITY Rachael Camacho is here to discuss her progress with her obesity treatment plan. She is on the portion control better and make smarter food choices, such as increase vegetables and decrease simple carbohydrates  and is following her eating plan approximately 0 % of the time. She states she is exercising 0 minutes 0 times per week. Rachael Camacho did well with minimizing weight gain over the holidays. She struggled to follow any of our plans in the past but she states she is ready to get back on track.  Her weight is 206 lb (93.4 kg) today and has gained 1 pound since her last visit. She has lost 1 lbs since starting treatment with [place].  Vitamin D deficiency Rachael Camacho has a diagnosis of vitamin D deficiency. She is stable on prescription Vit D and denies nausea, vomiting or muscle weakness.  Depression with emotional eating behaviors Rachael Camacho is still struggling with emotional eating and she is uncertain if Wellbutrin is helping and she is using food for comfort to the extent that it is negatively impacting her health. She often snacks when she is not hungry. Rachael Camacho sometimes feels she is out of control and then feels guilty that she made poor food choices. She has been working on behavior modification techniques to help reduce her emotional eating and has been somewhat successful. She shows no sign of suicidal or homicidal ideations.  Depression screen [facility] 2/9 09/10/2017 10/18/2016 07/18/2016 05/02/2016 03/01/2016  Decreased Interest 2 0 0 0 0  Down, Depressed, Hopeless 2 0 0 0 0  PHQ - 2 Score 4 0 0 0 0  Altered sleeping 1 - - - -  Tired, decreased energy 3 - - - -  Change in appetite 1 - - - -  Feeling bad or failure about yourself  3 - - - -  Trouble concentrating 2 - - - -  Moving slowly or fidgety/restless 2 - - - -  Suicidal thoughts 0 - - - -  PHQ-9 Score 16 - - - -  Difficult doing work/chores Somewhat difficult - - - -    ALLERGIES: Allergies  Allergen Reactions  . Amoxicillin Other (See Comments)    Immediate yeast infection  Has patient had a PCN reaction causing immediate rash, facial/tongue/throat swelling, SOB or lightheadedness with hypotension: No Has patient had a PCN reaction causing severe rash involving mucus membranes or skin necrosis: No Has patient had a PCN reaction that required hospitalization: No Has patient had a PCN reaction occurring within the last 10 years: No If all of the above answers are "NO", then may proceed with Cephalosporin use.   [name] Percocet [Oxycodone-Acetaminophen] Itching    Tolerates acetaminophen alone  . [name] [Nitrofurantoin Monohyd Macro] Rash and Hives  . [name] Hives and Rash    Reported chicken pox-like rash, not hives  . Sulfa Antibiotics Rash and Hives    MEDICATIONS: Current Outpatient Medications on File Prior to Visit  Medication Sig Dispense Refill  . B-12, Methylcobalamin, 1000 MCG SUBL Place 1,000 mcg under the tongue daily.     . baclofen (LIORESAL) 10 MG tablet Take 1 tablet (10 mg total) by mouth 3 (three) times daily. 90 each 11  . BIOTIN PO Take 1 tablet by mouth daily.    [name] buPROPion (WELLBUTRIN SR) 200 MG 12 hr tablet Take 1 tablet (200 mg total) by mouth daily at 12 noon. 30 tablet 0  . desloratadine (  CLARINEX) 5 MG tablet Take 5 mg by mouth at bedtime as needed (allergies.).   12  . fluticasone (FLONASE) 50 MCG/ACT nasal spray Place 1 spray into both nostrils at bedtime as needed for allergies or rhinitis.     . furosemide (LASIX) 20 MG tablet TAKE 1 TABLET (20 MG TOTAL) BY MOUTH DAILY. 90 tablet 3  . gabapentin (NEURONTIN) 100 MG capsule Take 1 capsule (100 mg total) by mouth 3 (three) times daily. 90 capsule 6  . Macitentan (OPSUMIT) 10 MG TABS Take 1 tablet (10 mg total) by mouth daily. 90 tablet 3  . Multiple Vitamins-Minerals (MULTIVITAMIN WITH MINERALS) tablet Take 1 tablet by mouth daily.     . Probiotic Product (4X  PROBIOTIC) TABS Take 1 tablet by mouth daily.    . Vitamin D, Ergocalciferol, (DRISDOL) 50000 units CAPS capsule Take 1 capsule (50,000 Units total) by mouth every 7 (seven) days. 4 capsule 1   No current facility-administered medications on file prior to visit.     PAST MEDICAL HISTORY: Past Medical History:  Diagnosis Date  . Anemia    as child in first grade  . Anxiety   . Arthritis   . Back pain   . Constipation   . CREST syndrome (HCC)   . Dyspnea   . Family history of adverse reaction to anesthesia    mom has had problems in past  . Gallbladder problem   . GERD (gastroesophageal reflux disease)   . Gestational diabetes mellitus in childbirth, diet controlled 1987  . IBS (irritable bowel syndrome)   . Joint pain   . Leg edema   . Menopause   . Osteoarthritis (arthritis due to wear and tear of joints)   . PAH (pulmonary artery hypertension) (HCC)   . Pulmonary fibrosis (HCC)   . Raynaud disease    hands-toes  . Scleroderma (HCC)   . Shortness of breath dyspnea    pulmonary fibrosis-scleraderma  . Sleep apnea   . Swallowing difficulty   . Thyroid cyst   . Vitamin D deficiency   . Wears glasses     PAST SURGICAL HISTORY: Past Surgical History:  Procedure Laterality Date  . CARDIAC CATHETERIZATION N/A 11/25/2015   Procedure: Right Heart Cath;  Surgeon: Dolores Pattyaniel R Bensimhon, MD;  [facility];  Service: Cardiovascular;  Laterality: N/A;  . CESAREAN SECTION  87,95  . CHOLECYSTECTOMY  1996   lap choli  . ESOPHAGOGASTRODUODENOSCOPY (EGD) WITH PROPOFOL N/A 02/12/2017   Procedure: ESOPHAGOGASTRODUODENOSCOPY (EGD) WITH PROPOFOL;  Surgeon: Vida RiggerMarc Magod, MD;  Location: [facility];  Service: Endoscopy;  Laterality: N/A;  . HIATAL HERNIA REPAIR  07/26/2015   Procedure: LAPAROSCOPIC REPAIR OF HIATAL HERNIA;  Surgeon: Luretha MurphyMatthew Stidham, MD;  Location: [facility];  Service: General;;  . LAPAROSCOPIC GASTRIC SLEEVE RESECTION N/A 07/26/2015   Procedure: LAPAROSCOPIC GASTRIC  SLEEVE RESECTION;  Surgeon: Luretha MurphyMatthew Chervenak, MD;  Location: [facility];  Service: General;  Laterality: N/A;  . LEFT AND RIGHT HEART CATHETERIZATION WITH CORONARY ANGIOGRAM N/A 03/14/2015   Procedure: LEFT AND RIGHT HEART CATHETERIZATION WITH CORONARY ANGIOGRAM;  Surgeon: Peter M SwazilandJordan, MD;  [facility];  Service: Cardiovascular;  Laterality: N/A;  . RADIAL HEAD ARTHROPLASTY Right 07/28/2014   Procedure: RIGHT RADIAL HEAD REPLACEMENT;  Surgeon: Dairl PonderMatthew Weingold, MD;  Location: [facility];  Service: Orthopedics;  Laterality: Right;  . RIGHT HEART CATH N/A 03/18/2017   Procedure: Right Heart Cath;  Surgeon: Dolores Pattyaniel R Bensimhon, MD;  [facility];  Service: Cardiovascular;  Laterality: N/A;  . SAVORY DILATION N/A 02/12/2017   Procedure: SAVORY DILATION;  Surgeon: Vida Rigger, MD;  Location: [facility];  Service: Endoscopy;  Laterality: N/A;  . TUBAL LIGATION    . UPPER GI ENDOSCOPY  07/26/2015   Procedure: UPPER GI ENDOSCOPY;  Surgeon: Luretha Murphy, MD;  Location: [facility];  Service: General;;  . VAGINAL HYSTERECTOMY  2010   LAVH    SOCIAL HISTORY: Social History   Tobacco Use  . Smoking status: Never Smoker  . Smokeless tobacco: Never Used  Substance Use Topics  . Alcohol use: Yes    Alcohol/week: 0.0 oz    Comment: occ  . Drug use: No    FAMILY HISTORY: Family History  Problem Relation Age of Onset  . Heart failure Mother        Stent put in 2014 for blockage  . COPD Mother   . Hypertension Mother   . Hyperlipidemia Mother   . Heart disease Mother   . Thyroid disease Mother   . Cancer Mother   . Depression Mother   . Anxiety disorder Mother   . Heart disease Father   . Heart disease Son        Repair ASD  . Neuropathy Neg Hx     ROS: Review of Systems  Constitutional: Negative for weight loss.  Gastrointestinal: Negative for nausea and vomiting.  Musculoskeletal:       Negative muscle weakness  Psychiatric/Behavioral: Positive for  depression. Negative for suicidal ideas.    PHYSICAL EXAM: Blood pressure 104/71, pulse 74, temperature 97.8 F (36.6 C), temperature source Oral, height 5\' 4"  (1.626 m), weight 206 lb (93.4 kg), SpO2 98 %. Body mass index is 35.36 kg/m. Physical Exam  Constitutional: She is oriented to person, place, and time. She appears well-developed and well-nourished.  Cardiovascular: Normal rate.  Pulmonary/Chest: Effort normal.  Musculoskeletal: Normal range of motion.  Neurological: She is oriented to person, place, and time.  Skin: Skin is warm and dry.  Psychiatric: She has a normal mood and affect.  Vitals reviewed.   RECENT LABS AND TESTS: BMET    Component Value Date/Time   NA 143 12/10/2017 0955   K 4.2 12/10/2017 0955   CL 107 (H) 12/10/2017 0955   CO2 26 12/10/2017 0955   GLUCOSE 85 12/10/2017 0955   GLUCOSE 106 (H) 03/13/2017 1453   BUN 14 12/10/2017 0955   CREATININE 0.81 12/10/2017 0955   CREATININE 0.75 03/08/2015 1501   CALCIUM 9.2 12/10/2017 0955   GFRNONAA 84 12/10/2017 0955   GFRNONAA >89 03/08/2015 1501   GFRAA 97 12/10/2017 0955   GFRAA >89 03/08/2015 1501   Lab Results  Component Value Date   HGBA1C 4.9 12/10/2017   HGBA1C 5.0 09/10/2017   Lab Results  Component Value Date   INSULIN 4.4 12/10/2017   INSULIN 6.1 09/10/2017   CBC    Component Value Date/Time   WBC 6.7 09/10/2017 1108   WBC 7.3 03/13/2017 1453   RBC 4.60 09/10/2017 1108   RBC 4.55 03/13/2017 1453   HGB 13.9 09/10/2017 1108   HCT 41.6 09/10/2017 1108   PLT 235 03/13/2017 1453   MCV 90 09/10/2017 1108   MCH 30.2 09/10/2017 1108   MCH 30.1 03/13/2017 1453   MCHC 33.4 09/10/2017 1108   MCHC 33.5 03/13/2017 1453   RDW 14.1 09/10/2017 1108   LYMPHSABS 2.3 09/10/2017 1108   MONOABS 0.9 07/28/2015 0455   EOSABS 0.2 09/10/2017 1108  BASOSABS 0.0 09/10/2017 1108   Iron/TIBC/Ferritin/ %Sat No results found for: IRON, TIBC, FERRITIN, IRONPCTSAT Lipid Panel     Component Value  Date/Time   CHOL 151 09/10/2017 1108   TRIG 102 09/10/2017 1108   HDL 70 09/10/2017 1108   LDLCALC 61 09/10/2017 1108   Hepatic Function Panel     Component Value Date/Time   PROT 6.3 12/10/2017 0955   ALBUMIN 4.2 12/10/2017 0955   AST 18 12/10/2017 0955   ALT 15 12/10/2017 0955   ALKPHOS 85 12/10/2017 0955   BILITOT 0.5 12/10/2017 0955      Component Value Date/Time   TSH 2.690 09/10/2017 1108  Results for RAVEENA, HEBDON (MRN [id number]) as of 12/31/2017 10:10  Ref. Range 12/10/2017 09:55  Vitamin D, 25-Hydroxy Latest Ref Range: 30.0 - 100.0 ng/mL 51.3    ASSESSMENT AND PLAN: Vitamin D deficiency - Plan: Vitamin D, Ergocalciferol, (DRISDOL) 50000 units CAPS capsule  Other depression - with emotional eating - Plan: buPROPion (WELLBUTRIN SR) 200 MG 12 hr tablet  Class 2 severe obesity with serious comorbidity and body mass index (BMI) of 35.0 to 35.9 in adult, unspecified obesity type (HCC)  PLAN:  Vitamin D Deficiency Rachael Camacho was informed that low vitamin D levels contributes to fatigue and are associated with obesity, breast, and colon cancer. Rachael Camacho agrees to continue taking prescription Vit D @50 ,000 IU every week #4 and we will refill for 1 month and will follow up for routine testing of vitamin D, at least 2-3 times per year. She was informed of the risk of over-replacement of vitamin D and agrees to not increase her dose unless he discusses this with [place] first. Rachael Camacho agrees to follow up with our clinic in 2 weeks with our dietitian and follow up in 4 weeks with myself.  Depression with Emotional Eating Behaviors We discussed behavior modification techniques today to help Ellissa deal with her emotional eating and depression. Rachael Camacho agrees to continue taking Wellbutrin SR 200 mg qd #30 and we will refill for 1 month. She will continue to work on mindful eating and journaling to help change habits. Rachael Camacho agrees to follow up with our clinic in 2 weeks with our dietitian and  follow up in 4 weeks with myself.  Obesity Rachael Camacho is currently in the action stage of change. As such, her goal is to continue with weight loss efforts She has agreed to change to keep a food journal with 1000-1300 calories and 75+ grams of protein daily Rachael Camacho has been instructed to work up to a goal of 150 minutes of combined cardio and strengthening exercise per week for weight loss and overall health benefits. We discussed the following Behavioral Modification Strategies today: increasing lean protein intake, work on meal planning and easy cooking plans, and planning for success   Rachael Camacho has agreed to follow up with our clinic in 2 weeks with our dietitian and follow up in 4 weeks with myself. She was informed of the importance of frequent follow up visits to maximize her success with intensive lifestyle modifications for her multiple health conditions.   OBESITY BEHAVIORAL INTERVENTION VISIT  Today's visit was # 7 out of 22.  Starting weight: 207 lbs Starting date: 09/10/17 Today's weight : 206 lbs  Today's date: 12/31/2017 Total lbs lost to date: 1 (Patients must lose 7 lbs in the first 6 months to continue with counseling)   ASK: We discussed the diagnosis of obesity with Rachael Camacho Saver today and Rachael Camacho agreed to give [place]  permission to discuss obesity behavioral modification therapy today.  ASSESS: Safina has the diagnosis of obesity and her BMI today is 35.34 Brennen is in the action stage of change   ADVISE: Rachael Camacho was educated on the multiple health risks of obesity as well as the benefit of weight loss to improve her health. She was advised of the need for long term treatment and the importance of lifestyle modifications.  AGREE: Multiple dietary modification options and treatment options were discussed and  Rachael Camacho agreed to the above obesity treatment plan.  I, Burt Knack, am acting as transcriptionist for Quillian Quince, MD  I have reviewed the above documentation  for accuracy and completeness, and I agree with the above. -Quillian Quince, MD

## 2018-01-01 ENCOUNTER — Encounter: Payer: Self-pay | Admitting: Neurology

## 2018-01-01 ENCOUNTER — Encounter (INDEPENDENT_AMBULATORY_CARE_PROVIDER_SITE_OTHER): Payer: Self-pay | Admitting: Family Medicine

## 2018-01-07 ENCOUNTER — Encounter (INDEPENDENT_AMBULATORY_CARE_PROVIDER_SITE_OTHER): Payer: Self-pay | Admitting: Family Medicine

## 2018-01-08 ENCOUNTER — Other Ambulatory Visit (INDEPENDENT_AMBULATORY_CARE_PROVIDER_SITE_OTHER): Payer: Self-pay

## 2018-01-08 DIAGNOSIS — F3289 Other specified depressive episodes: Secondary | ICD-10-CM

## 2018-01-08 MED ORDER — BUPROPION HCL ER (SR) 200 MG PO TB12: 200.0000 mg | ORAL_TABLET | Freq: Every day | ORAL | 0 refills | Status: DC

## 2018-01-10 NOTE — Pre-Procedure Instructions (Signed)
Rachael Camacho  01/10/2018      [id number] [place] [place] - [address] [place] [id number] Phone: [phone] Fax: [phone]  [id number] [name] [place] [address] [facility] [place] [address] [place] [id number] Phone: [phone] Fax: [phone]  [facility] [place] - [address] [place] [id number] Phone: [phone] Fax: [phone]    Your procedure is scheduled on January 24  [facility].M.  Call this number if you have problems the morning of surgery:  [phone]   Remember:  Do not eat food or drink liquids after midnight.  Continue all medications as directed by your physician except follow these medication instructions before surgery below   Take these medicines the morning of surgery with A SIP OF WATER  acetaminophen (TYLENOL) buPROPion (WELLBUTRIN SR)  gabapentin (NEURONTIN)  levothyroxine (SYNTHROID, LEVOTHROID)  7 days prior to surgery STOP taking any Aspirin(unless otherwise instructed by your surgeon), Aleve, Naproxen, Ibuprofen, Motrin, Advil, Goody's, BC's, all herbal medications, fish oil, and all vitamins    Do not wear jewelry, make-up or nail polish.  Do not wear lotions, powders, or perfumes, or deodorant.  Do not shave 48 hours prior to surgery.   Do not bring valuables to the hospital.  [facility] is not responsible for any belongings or valuables.  Contacts, dentures or bridgework may not be worn into surgery.  Leave your suitcase in the car.  After surgery it may be brought to your room.  For patients admitted to the hospital, discharge time will be determined by your treatment team.  Patients discharged the day of surgery will not be allowed to drive home.    Special instructions:   [facility]- Preparing For Surgery  Before  surgery, you can play an important role. Because skin is not sterile, your skin needs to be as free of germs as possible. You can reduce the number of germs on your skin by washing with CHG (chlorahexidine gluconate) Soap before surgery.  CHG is an antiseptic cleaner which kills germs and bonds with the skin to continue killing germs even after washing.  Please do not use if you have an allergy to CHG or antibacterial soaps. If your skin becomes reddened/irritated stop using the CHG.  Do not shave (including legs and underarms) for at least 48 hours prior to first CHG shower. It is OK to shave your face.  Please follow these instructions carefully.   1. Shower the NIGHT BEFORE SURGERY and the MORNING OF SURGERY with CHG.   2. If you chose to wash your hair, wash your hair first as usual with your normal shampoo.  3. After you shampoo, rinse your hair and body thoroughly to remove the shampoo.  4. Use CHG as you would any other liquid soap. You can apply CHG directly to the skin and wash gently with a scrungie or a clean washcloth.   5. Apply the CHG Soap to your body ONLY FROM THE NECK DOWN.  Do not use on open wounds or open sores. Avoid contact with your eyes, ears, mouth and genitals (private parts). Wash Face and genitals (private parts)  with your normal soap.  6. Wash thoroughly, paying special attention to the area where your surgery will be performed.  7. Thoroughly rinse your body  with warm water from the neck down.  8. DO NOT shower/wash with your normal soap after using and rinsing off the CHG Soap.  9. Pat yourself dry with a CLEAN TOWEL.  10. Wear CLEAN PAJAMAS to bed the night before surgery, wear comfortable clothes the morning of surgery  11. Place CLEAN SHEETS on your bed the night of your first shower and DO NOT SLEEP WITH PETS.    Day of Surgery: Do not apply any deodorants/lotions. Please wear clean clothes to the hospital/surgery center.      Please read over  the following fact sheets that you were given.

## 2018-01-13 ENCOUNTER — Other Ambulatory Visit: Payer: Self-pay

## 2018-01-13 ENCOUNTER — Ambulatory Visit (INDEPENDENT_AMBULATORY_CARE_PROVIDER_SITE_OTHER): Payer: BLUE CROSS/BLUE SHIELD | Admitting: Dietician

## 2018-01-13 ENCOUNTER — Encounter (HOSPITAL_COMMUNITY)
Admission: RE | Admit: 2018-01-13 | Discharge: 2018-01-13 | Disposition: A | Discharge status: Home / Self Care | Payer: BLUE CROSS/BLUE SHIELD | Source: Ambulatory Visit | Attending: Neurosurgery | Admitting: Neurosurgery

## 2018-01-13 ENCOUNTER — Encounter (HOSPITAL_COMMUNITY): Payer: Self-pay

## 2018-01-13 DIAGNOSIS — K219 Gastro-esophageal reflux disease without esophagitis: Secondary | ICD-10-CM | POA: Insufficient documentation

## 2018-01-13 DIAGNOSIS — M1990 Unspecified osteoarthritis, unspecified site: Secondary | ICD-10-CM

## 2018-01-13 DIAGNOSIS — Z79890 Hormone replacement therapy (postmenopausal): Secondary | ICD-10-CM | POA: Insufficient documentation

## 2018-01-13 DIAGNOSIS — I272 Pulmonary hypertension, unspecified: Secondary | ICD-10-CM | POA: Insufficient documentation

## 2018-01-13 DIAGNOSIS — Z9884 Bariatric surgery status: Secondary | ICD-10-CM

## 2018-01-13 DIAGNOSIS — F419 Anxiety disorder, unspecified: Secondary | ICD-10-CM

## 2018-01-13 DIAGNOSIS — Z7951 Long term (current) use of inhaled steroids: Secondary | ICD-10-CM

## 2018-01-13 DIAGNOSIS — M349 Systemic sclerosis, unspecified: Secondary | ICD-10-CM

## 2018-01-13 DIAGNOSIS — G4733 Obstructive sleep apnea (adult) (pediatric): Secondary | ICD-10-CM | POA: Insufficient documentation

## 2018-01-13 DIAGNOSIS — M341 CR(E)ST syndrome: Secondary | ICD-10-CM | POA: Insufficient documentation

## 2018-01-13 DIAGNOSIS — Z9049 Acquired absence of other specified parts of digestive tract: Secondary | ICD-10-CM | POA: Insufficient documentation

## 2018-01-13 DIAGNOSIS — Z8632 Personal history of gestational diabetes: Secondary | ICD-10-CM

## 2018-01-13 DIAGNOSIS — K589 Irritable bowel syndrome without diarrhea: Secondary | ICD-10-CM

## 2018-01-13 DIAGNOSIS — Z01812 Encounter for preprocedural laboratory examination: Secondary | ICD-10-CM

## 2018-01-13 DIAGNOSIS — J8410 Pulmonary fibrosis, unspecified: Secondary | ICD-10-CM | POA: Insufficient documentation

## 2018-01-13 DIAGNOSIS — I5032 Chronic diastolic (congestive) heart failure: Secondary | ICD-10-CM

## 2018-01-13 DIAGNOSIS — Z90710 Acquired absence of both cervix and uterus: Secondary | ICD-10-CM | POA: Insufficient documentation

## 2018-01-13 DIAGNOSIS — Z79899 Other long term (current) drug therapy: Secondary | ICD-10-CM | POA: Insufficient documentation

## 2018-01-13 DIAGNOSIS — Z01818 Encounter for other preprocedural examination: Secondary | ICD-10-CM | POA: Insufficient documentation

## 2018-01-13 DIAGNOSIS — E041 Nontoxic single thyroid nodule: Secondary | ICD-10-CM

## 2018-01-13 LAB — BASIC METABOLIC PANEL
Anion gap: 8 (ref 5–15)
BUN: 13 mg/dL (ref 6–20)
CHLORIDE: 106 mmol/L (ref 101–111)
CO2: 27 mmol/L (ref 22–32)
Calcium: 9.2 mg/dL (ref 8.9–10.3)
Creatinine, Ser: 0.91 mg/dL (ref 0.44–1.00)
GFR calc Af Amer: >60 mL/min (ref >60)
GFR calc non Af Amer: >60 mL/min (ref >60)
GLUCOSE: 86 mg/dL (ref 65–99)
Potassium: 3.6 mmol/L (ref 3.5–5.1)
Sodium: 141 mmol/L (ref 135–145)

## 2018-01-13 LAB — CBC
HEMATOCRIT: 40.7 % (ref 36.0–46.0)
HEMOGLOBIN: 13.5 g/dL (ref 12.0–15.0)
MCH: 30.5 pg (ref 26.0–34.0)
MCHC: 33.2 g/dL (ref 30.0–36.0)
MCV: 91.9 fL (ref 78.0–100.0)
Platelets: 220 10*3/uL (ref 150–400)
RBC: 4.43 MIL/uL (ref 3.87–5.11)
RDW: 13.4 % (ref 11.5–15.5)
WBC: 6.2 10*3/uL (ref 4.0–10.5)

## 2018-01-13 LAB — ABO/RH: ABO/RH(D): O POS

## 2018-01-13 LAB — TYPE AND SCREEN
ABO/RH(D): O POS
Antibody Screen: NEGATIVE

## 2018-01-13 LAB — SURGICAL PCR SCREEN
MRSA, PCR: NEGATIVE
STAPHYLOCOCCUS AUREUS: NEGATIVE

## 2018-01-13 NOTE — Progress Notes (Signed)
PCP - Dr. Judithann Sauger. Rachael Camacho Cardiologist - Dr. Milas KocherBensimon for pulmonary artery hypertension and pulmonary fibrosis-scleroderma  Chest x-ray - n/a EKG - 09/10/17 Stress Test - 2015 ECHO - 2018 Cardiac Cath - 2018  Sleep Study - 2016 CPAP - wore O2 at night until last year and it was discontinued with weight loss  Anesthesia review: yes, PAH, pulmonary fibrosis  Patient denies shortness of breath, fever, cough and chest pain at PAT appointment   Patient verbalized understanding of instructions that were given to them at the PAT appointment. Patient was also instructed that they will need to review over the PAT instructions again at home before surgery.

## 2018-01-13 NOTE — Progress Notes (Signed)
Anesthesia PAT Evaluation: Patient is a 52 year old female scheduled for C1-2 posterior instrumentation and fusion on 01/16/18 by Dr. Tressie Stalker. OR room is booked 7:30 AM - 10:21 AM.  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 Seen by neurology 11/13/17 for right sided trigeminal neuralgia and mild osteoarthritis of the right TMJ. BMI is consistent with obesity. Reports her mother had severe nausea after one surgery and was hard to wake up from anesthesia with another surgery. Patient denied any personal known complications with anesthesia--only reported that after radial head replacement that she had to stay overnight due to oxygen desaturations although it was unclear if readings were always accurate due to Raynauds'. (If her hands are cold, O2 sat readings have been more accurate using her ear or forehead.) She denied difficult intubation history. Anesthesia records only list oral 7.5 mm ETT placed with stylet on 07/26/15 and LMA used for 07/28/14 surgery.   - PCP is Dr. Daisy Floro. She is also seen by Dr. Quillian Quince at the [facility]. - Neurologist is Dr. Naomie Dean. - Pulmonologist is Dr. Cyril Mourning, last visit 04/23/17. Nocturnal O2 discontinued 04/2017. One year follow-up with repeat DLCO testing.  - HF/PAH cardiologist is Dr. Arvilla Meres. Last visit was 03/18/17 at time of RHC. - Primary cardiologist is Dr. Olga Millers. Last visit 04/05/15. Has since been seeing Dr. Gala Romney. - GI is Dr. Vida Rigger. - Rheumatologist is Dr. Haydee Salter. - Endocrinologist is Dr. Dorisann Frames.  Meds include baclofen, Wellbutrin SR, Clarinex, Flonase, Lasix, gabapentin, levothyroxine, macitentan (Opsumit).   BP (!) 110/55   Pulse 70   Temp 36.6 C   Resp 20   Ht 5\' 4"  (1.626 m)   Wt 208 lb 12.8 oz (94.7 kg)   SpO2 95%   BMI 35.84 kg/m  Patient is a pleasant Caucasian female in NAD. No conversational dyspnea. She denied any recent illness. She works in Clinical biochemist. She reports some dysphagia (food occasionally getting "stuck" when swallowing, but unclear if related to scleroderma, thyroid cyst or gastric sleeve or combination of all three). She reports stable DOE, particularly with climbing stairs. She is able to clean her house.  Heart: RRR, no murmur noted. Lungs clear. Mallampati II. Mouth opening ~ 3 FB.   EKG 09/10/17: SR, old anterior infarct.  RHC 03/18/17: Findings: RA = 7 RV = 62/11 PA = 61/23 (39) PCW = 8 Fick cardiac output/index = 5.8/3.0 PVR = 5.3 WU Ao sat = 98% PA sat = 74%, 73% Assessment: Mild to moderate PAH in setting of scleroderma. (PA pressures increased since 11/25/15, previously 44/16 (28)). Plan/Discussion: Start Macitentan 10mg  daily.  Echo 01/29/17: Study Conclusions - Left ventricle: The cavity size was normal. Systolic function was   normal. The estimated ejection fraction was in the range of 60%   to 65%. Wall motion was normal; there were no regional wall   motion abnormalities. Left ventricular diastolic function   parameters were normal. - Aortic valve: There was trivial regurgitation. - Left atrium: The atrium was mildly dilated. - Pulmonary arteries: PA peak pressure: 42 mm Hg (S).  LHC (also  RHC) 03/14/15: Coronary dominance: right Left mainstem: Normal Left anterior descending (LAD): Minimal irregularities less than 10% Left circumflex (LCx): Normal Right coronary artery (RCA): Minimal irregularities less than 10%  Left ventriculography:  Left ventricular systolic function is normal, LVEF is estimated at 55-65%, there is no significant mitral regurgitation  Final Conclusions:   1. No significant CAD 2. Normal LV function 3. Moderate pulmonary HTN with normal LV filling pressures. Recommendations: Continue current diuretic dose. Medical management.  ETT 12/14/14:  ETT Interpretation: normal - no evidence of ischemia by ST  analysis Comments:  Moderate to severely reduced exercise tolerance given the  extent of limiting dyspnea @ 4:30 mi exercise (Borg Scale 16)  Poor recovery time <20bpm in 1 min.  No EKG evidence of ischemic changes. Recommendations:  Lifestyle modification  Weight Loss - Diet & Exercise  If clinically indicated, would consider pharmacologic imaging  stress test for better sensitivity / specificity.  Chest CTA 12/12/15: IMPRESSION: 1. Dilated main pulmonary artery, suggesting pulmonary arterial hypertension. 2. Faint coalescent ground-glass centrilobular nodularity throughout both lungs. Given the suspected pulmonary hypertension, this is favored to be a manifestation of mild plexogenic pulmonary arteriopathy. Hypersensitivity pneumonitis is another consideration, although less likely given the absence of significant air trapping. 3. Otherwise no evidence of interstitial lung disease, specifically no significant regions of subpleural reticulation, traction bronchiectasis or frank honeycombing. 4. One vessel coronary atherosclerosis. 5. Mild pericardial fluid/thickening.  PFTs 02/08/17: FVC 3.42 (101%), FEV1 2.83 (106%), FEV1/FVC ration 83% (103%), DLCO unc 13.73 (59%).  Conclusions: Moderate diffusion defect-pulmonary vascular.  The diffusion defect is consistent with a pulmonary vascular process.  In view of the severity of the diffusion defect, studies with exercise would be helpful to evaluate the presence of hypoxemia.  Preoperative labs noted. BMET and CBC WNL.   Patient appears well.  Reports stable pulmonary symptoms. She is not on home O2. She is on Opsumit for PAH. Last RHC and echo within the past year. Mouth opening is somewhat small (may be due to scleroderma or TMJ). Discussed anesthesia team may consider using advanced airway equipment such as Glidescope. Also reviewed PAH places her at increased risk for longer uses of supplemental O2 or post-operative ventilation. She has not had any issues with prolonged intubation in the past. She continues to have routine follow-up with pulmonology and cardiology regarding PAH. No new symptoms. If no acute changes then I anticipate that she can proceed as planned. Discussed with anesthesiologist Dr. Val Eaglehristopher Moser.  Velna Ochsllison Katori Wirsing, PA-C [facility]/Anesthesiology Phone [phone] 01/13/2018 1:55 PM

## 2018-01-13 NOTE — Pre-Procedure Instructions (Signed)
Rachael Camacho  01/13/2018      [id number] - [place], [place] - [address] [place] [address] Phone: [phone] Fax: [phone]  [id number] [name], [facility] - [address] AT [facility] [address] [facility] [place] [address] Phone: [phone] Fax: [phone]  [facility] - [name], [place] - [address] [place] [address] Phone: [phone] Fax: [phone]    Your procedure is scheduled on January 24  Report to [facility] Admitting at 0530 A.M.  Call this number if you have problems the morning of surgery:  [phone]   Remember:  Do not eat food or drink liquids after midnight.  Continue all medications as directed by your physician except follow these medication instructions before surgery below   Take these medicines the morning of surgery with A SIP OF WATER  acetaminophen (TYLENOL) buPROPion (WELLBUTRIN SR)  gabapentin (NEURONTIN)  levothyroxine (SYNTHROID, LEVOTHROID) Macitentan (OPSUMIT)  7 days prior to surgery STOP taking any Aspirin(unless otherwise instructed by your surgeon), Aleve, Naproxen, Ibuprofen, Motrin, Advil, Goody's, BC's, all herbal medications, fish oil, and all vitamins    Do not wear jewelry, make-up or nail polish.  Do not wear lotions, powders, or perfumes, or deodorant.  Do not shave 48 hours prior to surgery.   Do not bring valuables to the hospital.  [facility] is not responsible for any belongings or valuables.  Contacts, dentures or bridgework may not be worn into surgery.  Leave your suitcase in the car.  After surgery it may be brought to your room.  For patients admitted to the hospital, discharge time will be determined by your treatment team.  Patients discharged the day of surgery will not be allowed to drive home.    Special instructions:   [place]- Preparing  For Surgery  Before surgery, you can play an important role. Because skin is not sterile, your skin needs to be as free of germs as possible. You can reduce the number of germs on your skin by washing with CHG (chlorahexidine gluconate) Soap before surgery.  CHG is an antiseptic cleaner which kills germs and bonds with the skin to continue killing germs even after washing.  Please do not use if you have an allergy to CHG or antibacterial soaps. If your skin becomes reddened/irritated stop using the CHG.  Do not shave (including legs and underarms) for at least 48 hours prior to first CHG shower. It is OK to shave your face.  Please follow these instructions carefully.   1. Shower the NIGHT BEFORE SURGERY and the MORNING OF SURGERY with CHG.   2. If you chose to wash your hair, wash your hair first as usual with your normal shampoo.  3. After you shampoo, rinse your hair and body thoroughly to remove the shampoo.  4. Use CHG as you would any other liquid soap. You can apply CHG directly to the skin and wash gently with a scrungie or a clean washcloth.   5. Apply the CHG Soap to your body ONLY FROM THE NECK DOWN.  Do not use on open wounds or open sores. Avoid contact with your eyes, ears, mouth and genitals (private parts). Wash Face and genitals (private parts)  with your normal soap.  6. Wash thoroughly, paying special attention to the area where your surgery will be performed.  7. Thoroughly rinse  your body with warm water from the neck down.  8. DO NOT shower/wash with your normal soap after using and rinsing off the CHG Soap.  9. Pat yourself dry with a CLEAN TOWEL.  10. Wear CLEAN PAJAMAS to bed the night before surgery, wear comfortable clothes the morning of surgery  11. Place CLEAN SHEETS on your bed the night of your first shower and DO NOT SLEEP WITH PETS.    Day of Surgery: Shower as stated above. Do not apply any deodorants/lotions.  Please wear clean clothes to the  hospital/surgery center.      Please read over the following fact sheets that you were given.

## 2018-01-14 ENCOUNTER — Ambulatory Visit (INDEPENDENT_AMBULATORY_CARE_PROVIDER_SITE_OTHER): Payer: BLUE CROSS/BLUE SHIELD | Admitting: Dietician

## 2018-01-14 VITALS — Ht 64.0 in | Wt 204.0 lb

## 2018-01-14 DIAGNOSIS — Z9189 Other specified personal risk factors, not elsewhere classified: Secondary | ICD-10-CM | POA: Diagnosis not present

## 2018-01-14 DIAGNOSIS — Z6835 Body mass index (BMI) 35.0-35.9, adult: Secondary | ICD-10-CM | POA: Diagnosis not present

## 2018-01-14 DIAGNOSIS — E8881 Metabolic syndrome: Secondary | ICD-10-CM | POA: Diagnosis not present

## 2018-01-15 MED ORDER — VANCOMYCIN HCL 10 G IV SOLR
1500.0000 mg | INTRAVENOUS | Status: AC
  Administered 2018-01-16: 1500 mg via INTRAVENOUS
  Filled 2018-01-15: qty 1500

## 2018-01-15 NOTE — Progress Notes (Signed)
  Office: [phone]  /  Fax: [phone]     Rachael Camacho has a diagnosis of insulin resistance based on her elevated fasting insulin level >5. Although Rachael Camacho's blood glucose readings are still under good control, insulin resistance puts her at greater risk of metabolic syndrome and diabetes. Rachael Camacho is here today for diabetes risk nutrition counseling which includes her obesity treatment plan.  [name] weight today is 204 lbs a 2 lb weight loss since her last visit. She reports  journaling 100% of her food intake most days. Per review of her food journal she is getting the recommended nutrition of 1000-1300 calories and 75+ grams of protein. She states she is having back surgery on 01/16/18. Discussed strategies for eating healthy during recovery.  Reviewed  nutrients ie protein, fats, simple and complex carbohydrates and how these affect insulin response. Focus on portion control,  avoiding simple carbohydrates and increasing lean protein for ongoing wt loss efforts.   Rachael Camacho is on the following meal plan: journaling 1000-1300 calories, 75+ grams of protein. Her meal plan was individualized for maximum benefit.  Also discussed at length the following behavioral modifications to help maximize success: increasing lean protein intake, decreasing simple carbohydrates,  increase water intake.    Rachael Camacho has been instructed to work up to a goal of 150 minutes of combined cardio and strengthening exercise per week for weight loss and overall health benefits. Written information was provided and the following handouts were given:  Eating out handout, smart fruits.    Office: [phone]  /  Fax: [phone]  OBESITY BEHAVIORAL INTERVENTION VISIT  Today's visit was # 8 out of 22.  Starting weight: 09/10/17 Starting date: 206 Today's weight : Weight: 204 lb (92.5 kg)  Today's date: 01/15/2018 Total lbs lost to date: 2 (Patients must lose 7 lbs in the first 6 months to continue with  counseling)   ASK: We discussed the diagnosis of obesity with [name] today and Rachael Camacho agreed to give us permission to discuss obesity behavioral modification therapy today.  ASSESS: Rachael Camacho has the diagnosis of obesity and her BMI today is 7635 Rachael Camacho is in the action stage of change   ADVISE: Rachael Camacho was educated on the multiple health risks of obesity as well as the benefit of weight loss to improve her health. She was advised of the need for long term treatment and the importance of lifestyle modifications.  AGREE: Multiple dietary modification options and treatment options were discussed and  Rachael Camacho agreed to keep a food journal with 1000-1300 calories and 75+ g  protein  We discussed the following Behavioral Modification Stratagies today: increasing lean protein intake, decreasing simple carbohydrates  and work on meal planning and easy cooking plans

## 2018-01-16 ENCOUNTER — Inpatient Hospital Stay (HOSPITAL_COMMUNITY): Payer: BLUE CROSS/BLUE SHIELD | Admitting: Certified Registered Nurse Anesthetist

## 2018-01-16 ENCOUNTER — Encounter (HOSPITAL_COMMUNITY)
Admission: RE | Disposition: A | Discharge status: Home / Self Care | Payer: Self-pay | Source: Ambulatory Visit | Attending: Neurosurgery

## 2018-01-16 ENCOUNTER — Inpatient Hospital Stay (HOSPITAL_COMMUNITY)
Admission: RE | Admit: 2018-01-16 | Discharge: 2018-01-17 | DRG: 473 | Disposition: A | Discharge status: Home / Self Care | Payer: BLUE CROSS/BLUE SHIELD | Source: Ambulatory Visit | Attending: Neurosurgery | Admitting: Neurosurgery

## 2018-01-16 ENCOUNTER — Encounter (HOSPITAL_COMMUNITY): Payer: Self-pay | Admitting: *Deleted

## 2018-01-16 ENCOUNTER — Inpatient Hospital Stay (HOSPITAL_COMMUNITY): Payer: BLUE CROSS/BLUE SHIELD

## 2018-01-16 ENCOUNTER — Inpatient Hospital Stay (HOSPITAL_COMMUNITY): Payer: BLUE CROSS/BLUE SHIELD | Admitting: Vascular Surgery

## 2018-01-16 DIAGNOSIS — E669 Obesity, unspecified: Secondary | ICD-10-CM | POA: Diagnosis present

## 2018-01-16 DIAGNOSIS — Z9884 Bariatric surgery status: Secondary | ICD-10-CM | POA: Diagnosis not present

## 2018-01-16 DIAGNOSIS — I739 Peripheral vascular disease, unspecified: Secondary | ICD-10-CM | POA: Diagnosis present

## 2018-01-16 DIAGNOSIS — Z96621 Presence of right artificial elbow joint: Secondary | ICD-10-CM | POA: Diagnosis present

## 2018-01-16 DIAGNOSIS — Z6835 Body mass index (BMI) 35.0-35.9, adult: Secondary | ICD-10-CM

## 2018-01-16 DIAGNOSIS — Z79899 Other long term (current) drug therapy: Secondary | ICD-10-CM | POA: Diagnosis not present

## 2018-01-16 DIAGNOSIS — Z881 Allergy status to other antibiotic agents status: Secondary | ICD-10-CM

## 2018-01-16 DIAGNOSIS — Z880 Allergy status to penicillin: Secondary | ICD-10-CM | POA: Diagnosis not present

## 2018-01-16 DIAGNOSIS — I2721 Secondary pulmonary arterial hypertension: Secondary | ICD-10-CM | POA: Diagnosis present

## 2018-01-16 DIAGNOSIS — M549 Dorsalgia, unspecified: Secondary | ICD-10-CM | POA: Diagnosis present

## 2018-01-16 DIAGNOSIS — I7300 Raynaud's syndrome without gangrene: Secondary | ICD-10-CM | POA: Diagnosis present

## 2018-01-16 DIAGNOSIS — J8410 Pulmonary fibrosis, unspecified: Secondary | ICD-10-CM | POA: Diagnosis present

## 2018-01-16 DIAGNOSIS — M532X1 Spinal instabilities, occipito-atlanto-axial region: Secondary | ICD-10-CM | POA: Diagnosis present

## 2018-01-16 DIAGNOSIS — F419 Anxiety disorder, unspecified: Secondary | ICD-10-CM | POA: Diagnosis present

## 2018-01-16 DIAGNOSIS — E559 Vitamin D deficiency, unspecified: Secondary | ICD-10-CM | POA: Diagnosis present

## 2018-01-16 DIAGNOSIS — G4730 Sleep apnea, unspecified: Secondary | ICD-10-CM | POA: Diagnosis present

## 2018-01-16 DIAGNOSIS — M1990 Unspecified osteoarthritis, unspecified site: Secondary | ICD-10-CM | POA: Diagnosis present

## 2018-01-16 DIAGNOSIS — Z882 Allergy status to sulfonamides status: Secondary | ICD-10-CM

## 2018-01-16 DIAGNOSIS — M341 CR(E)ST syndrome: Secondary | ICD-10-CM | POA: Diagnosis present

## 2018-01-16 DIAGNOSIS — M438X1 Other specified deforming dorsopathies, occipito-atlanto-axial region: Secondary | ICD-10-CM | POA: Diagnosis present

## 2018-01-16 DIAGNOSIS — Z885 Allergy status to narcotic agent status: Secondary | ICD-10-CM

## 2018-01-16 DIAGNOSIS — M532X2 Spinal instabilities, cervical region: Secondary | ICD-10-CM | POA: Diagnosis present

## 2018-01-16 DIAGNOSIS — Z419 Encounter for procedure for purposes other than remedying health state, unspecified: Secondary | ICD-10-CM

## 2018-01-16 DIAGNOSIS — K589 Irritable bowel syndrome without diarrhea: Secondary | ICD-10-CM | POA: Diagnosis present

## 2018-01-16 HISTORY — PX: POSTERIOR CERVICAL FUSION/FORAMINOTOMY: SHX5038

## 2018-01-16 SURGERY — POSTERIOR CERVICAL FUSION/FORAMINOTOMY LEVEL 1
Anesthesia: General

## 2018-01-16 MED ORDER — BUPIVACAINE-EPINEPHRINE 0.5% -1:200000 IJ SOLN
INTRAMUSCULAR | Status: DC | PRN
  Administered 2018-01-16: 10 mL

## 2018-01-16 MED ORDER — FENTANYL CITRATE (PF) 250 MCG/5ML IJ SOLN
INTRAMUSCULAR | Status: DC | PRN
  Administered 2018-01-16: 50 ug via INTRAVENOUS
  Administered 2018-01-16: 100 ug via INTRAVENOUS

## 2018-01-16 MED ORDER — FUROSEMIDE 20 MG PO TABS
10.0000 mg | ORAL_TABLET | Freq: Every day | ORAL | Status: DC
Start: 2018-01-16 — End: 2018-01-17
  Filled 2018-01-16: qty 1

## 2018-01-16 MED ORDER — ALBUMIN HUMAN 5 % IV SOLN
INTRAVENOUS | Status: DC | PRN
  Administered 2018-01-16: 08:00:00 via INTRAVENOUS

## 2018-01-16 MED ORDER — MACITENTAN 10 MG PO TABS: 10.0000 mg | ORAL_TABLET | Freq: Every day | ORAL | Status: DC

## 2018-01-16 MED ORDER — THROMBIN (RECOMBINANT) 5000 UNITS EX SOLR
CUTANEOUS | Status: AC
  Filled 2018-01-16: qty 5000

## 2018-01-16 MED ORDER — CHLORHEXIDINE GLUCONATE CLOTH 2 % EX PADS: 6.0000 | MEDICATED_PAD | Freq: Once | CUTANEOUS | Status: DC

## 2018-01-16 MED ORDER — MORPHINE SULFATE (PF) 4 MG/ML IV SOLN: 4.0000 mg | INTRAVENOUS | Status: DC | PRN

## 2018-01-16 MED ORDER — LACTATED RINGERS IV SOLN
INTRAVENOUS | Status: DC | PRN
  Administered 2018-01-16 (×2): via INTRAVENOUS

## 2018-01-16 MED ORDER — GLYCOPYRROLATE 0.2 MG/ML IV SOSY
PREFILLED_SYRINGE | INTRAVENOUS | Status: DC | PRN
  Administered 2018-01-16: .2 mg via INTRAVENOUS
  Administered 2018-01-16: 0.4 mg via INTRAVENOUS

## 2018-01-16 MED ORDER — MIDAZOLAM HCL 2 MG/2ML IJ SOLN
INTRAMUSCULAR | Status: AC
  Filled 2018-01-16: qty 2

## 2018-01-16 MED ORDER — ACETAMINOPHEN 650 MG RE SUPP: 650.0000 mg | RECTAL | Status: DC | PRN

## 2018-01-16 MED ORDER — LIDOCAINE 2% (20 MG/ML) 5 ML SYRINGE
INTRAMUSCULAR | Status: DC | PRN
  Administered 2018-01-16: 40 mg via INTRAVENOUS

## 2018-01-16 MED ORDER — EPHEDRINE SULFATE-NACL 50-0.9 MG/10ML-% IV SOSY
PREFILLED_SYRINGE | INTRAVENOUS | Status: DC | PRN
  Administered 2018-01-16: 10 mg via INTRAVENOUS

## 2018-01-16 MED ORDER — LORATADINE 10 MG PO TABS
10.0000 mg | ORAL_TABLET | Freq: Every day | ORAL | Status: DC
  Administered 2018-01-16: 10 mg via ORAL
  Filled 2018-01-16 (×2): qty 1

## 2018-01-16 MED ORDER — SODIUM CHLORIDE 0.9 % IR SOLN
Status: DC | PRN
  Administered 2018-01-16: 09:00:00

## 2018-01-16 MED ORDER — BACITRACIN 500 UNIT/GM EX OINT
TOPICAL_OINTMENT | CUTANEOUS | Status: DC | PRN
  Administered 2018-01-16: 1 via TOPICAL

## 2018-01-16 MED ORDER — FENTANYL CITRATE (PF) 100 MCG/2ML IJ SOLN: 25.0000 ug | INTRAMUSCULAR | Status: DC | PRN

## 2018-01-16 MED ORDER — LEVOTHYROXINE SODIUM 25 MCG PO TABS
25.0000 ug | ORAL_TABLET | Freq: Every evening | ORAL | Status: DC
  Administered 2018-01-16: 25 ug via ORAL
  Filled 2018-01-16 (×2): qty 1

## 2018-01-16 MED ORDER — BISACODYL 10 MG RE SUPP: 10.0000 mg | Freq: Every day | RECTAL | Status: DC | PRN

## 2018-01-16 MED ORDER — BUPROPION HCL ER (SR) 100 MG PO TB12
200.0000 mg | ORAL_TABLET | Freq: Every day | ORAL | Status: DC
  Administered 2018-01-17: 200 mg via ORAL
  Filled 2018-01-16: qty 2

## 2018-01-16 MED ORDER — BUPIVACAINE-EPINEPHRINE (PF) 0.5% -1:200000 IJ SOLN
INTRAMUSCULAR | Status: AC
  Filled 2018-01-16: qty 30

## 2018-01-16 MED ORDER — BUPIVACAINE LIPOSOME 1.3 % IJ SUSP
INTRAMUSCULAR | Status: DC | PRN
  Administered 2018-01-16: 20 mL

## 2018-01-16 MED ORDER — BUPIVACAINE LIPOSOME 1.3 % IJ SUSP
20.0000 mL | INTRAMUSCULAR | Status: DC
  Filled 2018-01-16: qty 20

## 2018-01-16 MED ORDER — ONDANSETRON HCL 4 MG/2ML IJ SOLN
INTRAMUSCULAR | Status: DC | PRN
  Administered 2018-01-16: 4 mg via INTRAVENOUS

## 2018-01-16 MED ORDER — ALUM & MAG HYDROXIDE-SIMETH 200-200-20 MG/5ML PO SUSP: 30.0000 mL | Freq: Four times a day (QID) | ORAL | Status: DC | PRN

## 2018-01-16 MED ORDER — 0.9 % SODIUM CHLORIDE (POUR BTL) OPTIME
TOPICAL | Status: DC | PRN
  Administered 2018-01-16: 1000 mL

## 2018-01-16 MED ORDER — ONDANSETRON HCL 4 MG PO TABS: 4.0000 mg | ORAL_TABLET | Freq: Four times a day (QID) | ORAL | Status: DC | PRN

## 2018-01-16 MED ORDER — FLUTICASONE PROPIONATE 50 MCG/ACT NA SUSP
1.0000 | Freq: Every day | NASAL | Status: DC
  Filled 2018-01-16: qty 16

## 2018-01-16 MED ORDER — ONDANSETRON HCL 4 MG/2ML IJ SOLN: 4.0000 mg | Freq: Four times a day (QID) | INTRAMUSCULAR | Status: DC | PRN

## 2018-01-16 MED ORDER — BACITRACIN ZINC 500 UNIT/GM EX OINT
TOPICAL_OINTMENT | CUTANEOUS | Status: AC
  Filled 2018-01-16: qty 28.35

## 2018-01-16 MED ORDER — PHENYLEPHRINE 40 MCG/ML (10ML) SYRINGE FOR IV PUSH (FOR BLOOD PRESSURE SUPPORT)
PREFILLED_SYRINGE | INTRAVENOUS | Status: DC | PRN
  Administered 2018-01-16 (×4): 80 ug via INTRAVENOUS

## 2018-01-16 MED ORDER — ACETAMINOPHEN 325 MG PO TABS: 650.0000 mg | ORAL_TABLET | ORAL | Status: DC | PRN

## 2018-01-16 MED ORDER — CEFAZOLIN SODIUM-DEXTROSE 2-4 GM/100ML-% IV SOLN
2.0000 g | Freq: Three times a day (TID) | INTRAVENOUS | Status: AC
  Administered 2018-01-16 (×2): 2 g via INTRAVENOUS
  Filled 2018-01-16 (×2): qty 100

## 2018-01-16 MED ORDER — DOCUSATE SODIUM 100 MG PO CAPS
100.0000 mg | ORAL_CAPSULE | Freq: Two times a day (BID) | ORAL | Status: DC
  Administered 2018-01-16 – 2018-01-17 (×3): 100 mg via ORAL
  Filled 2018-01-16 (×3): qty 1

## 2018-01-16 MED ORDER — FENTANYL CITRATE (PF) 100 MCG/2ML IJ SOLN
INTRAMUSCULAR | Status: AC
Start: 2018-01-16 — End: 2018-01-16
  Filled 2018-01-16: qty 2

## 2018-01-16 MED ORDER — ZOLPIDEM TARTRATE 5 MG PO TABS: 5.0000 mg | ORAL_TABLET | Freq: Every evening | ORAL | Status: DC | PRN

## 2018-01-16 MED ORDER — THROMBIN (RECOMBINANT) 5000 UNITS EX SOLR
CUTANEOUS | Status: AC
Start: 2018-01-16 — End: 2018-01-16
  Filled 2018-01-16: qty 5000

## 2018-01-16 MED ORDER — FENTANYL CITRATE (PF) 250 MCG/5ML IJ SOLN
INTRAMUSCULAR | Status: AC
  Filled 2018-01-16: qty 5

## 2018-01-16 MED ORDER — PHENYLEPHRINE HCL 10 MG/ML IJ SOLN
INTRAVENOUS | Status: DC | PRN
  Administered 2018-01-16: 75 ug/min via INTRAVENOUS

## 2018-01-16 MED ORDER — FENTANYL CITRATE (PF) 100 MCG/2ML IJ SOLN
25.0000 ug | INTRAMUSCULAR | Status: DC | PRN
  Administered 2018-01-16: 50 ug via INTRAVENOUS

## 2018-01-16 MED ORDER — PROPOFOL 10 MG/ML IV BOLUS
INTRAVENOUS | Status: AC
Start: 2018-01-16 — End: 2018-01-16
  Filled 2018-01-16: qty 40

## 2018-01-16 MED ORDER — SUGAMMADEX SODIUM 200 MG/2ML IV SOLN
INTRAVENOUS | Status: DC | PRN
  Administered 2018-01-16: 200 mg via INTRAVENOUS

## 2018-01-16 MED ORDER — LACTATED RINGERS IV SOLN: INTRAVENOUS | Status: DC

## 2018-01-16 MED ORDER — GABAPENTIN 100 MG PO CAPS
100.0000 mg | ORAL_CAPSULE | Freq: Every day | ORAL | Status: DC
  Administered 2018-01-17: 100 mg via ORAL
  Filled 2018-01-16: qty 1

## 2018-01-16 MED ORDER — CYCLOBENZAPRINE HCL 10 MG PO TABS
10.0000 mg | ORAL_TABLET | Freq: Three times a day (TID) | ORAL | Status: DC | PRN
  Administered 2018-01-16 – 2018-01-17 (×2): 10 mg via ORAL
  Filled 2018-01-16 (×2): qty 1

## 2018-01-16 MED ORDER — MENTHOL 3 MG MT LOZG: 1.0000 | LOZENGE | OROMUCOSAL | Status: DC | PRN

## 2018-01-16 MED ORDER — HYDROMORPHONE HCL 2 MG PO TABS
4.0000 mg | ORAL_TABLET | ORAL | Status: DC | PRN
  Administered 2018-01-16 – 2018-01-17 (×5): 4 mg via ORAL
  Filled 2018-01-16 (×5): qty 2

## 2018-01-16 MED ORDER — DEXAMETHASONE SODIUM PHOSPHATE 10 MG/ML IJ SOLN
INTRAMUSCULAR | Status: DC | PRN
  Administered 2018-01-16: 10 mg via INTRAVENOUS

## 2018-01-16 MED ORDER — PROPOFOL 10 MG/ML IV BOLUS
INTRAVENOUS | Status: DC | PRN
  Administered 2018-01-16: 180 mg via INTRAVENOUS

## 2018-01-16 MED ORDER — MIDAZOLAM HCL 2 MG/2ML IJ SOLN
INTRAMUSCULAR | Status: DC | PRN
  Administered 2018-01-16: 2 mg via INTRAVENOUS

## 2018-01-16 MED ORDER — THROMBIN (RECOMBINANT) 5000 UNITS EX SOLR
OROMUCOSAL | Status: DC | PRN
  Administered 2018-01-16: 07:00:00 via TOPICAL

## 2018-01-16 MED ORDER — ROCURONIUM BROMIDE 10 MG/ML (PF) SYRINGE
PREFILLED_SYRINGE | INTRAVENOUS | Status: DC | PRN
  Administered 2018-01-16 (×2): 50 mg via INTRAVENOUS
  Administered 2018-01-16: 30 mg via INTRAVENOUS

## 2018-01-16 MED ORDER — BACLOFEN 10 MG PO TABS
10.0000 mg | ORAL_TABLET | Freq: Every day | ORAL | Status: DC
  Administered 2018-01-16: 10 mg via ORAL
  Filled 2018-01-16: qty 1

## 2018-01-16 MED ORDER — PHENOL 1.4 % MT LIQD: 1.0000 | OROMUCOSAL | Status: DC | PRN

## 2018-01-16 SURGICAL SUPPLY — 70 items
APL SKNCLS STERI-STRIP NONHPOA (GAUZE/BANDAGES/DRESSINGS) ×1
BAG DECANTER FOR FLEXI CONT (MISCELLANEOUS) ×2 IMPLANT
BENZOIN TINCTURE PRP APPL 2/3 (GAUZE/BANDAGES/DRESSINGS) ×1 IMPLANT
BIT DRILL NEURO 2X3.1 SFT TUCH (MISCELLANEOUS) ×1 IMPLANT
BLADE CLIPPER SURG (BLADE) ×1 IMPLANT
BLADE ULTRA TIP 2M (BLADE) IMPLANT
CABLE SNG STERILE W/CRIMP (MISCELLANEOUS) IMPLANT
CANISTER SUCT 3000ML PPV (MISCELLANEOUS) ×2 IMPLANT
CAP CLSR POST CERV (Cap) ×4 IMPLANT
CARTRIDGE OIL MAESTRO DRILL (MISCELLANEOUS) ×1 IMPLANT
DECANTER SPIKE VIAL GLASS SM (MISCELLANEOUS) ×2 IMPLANT
DIFFUSER DRILL AIR PNEUMATIC (MISCELLANEOUS) ×2 IMPLANT
DRAPE C-ARM 42X72 X-RAY (DRAPES) ×4 IMPLANT
DRAPE LAPAROTOMY 100X72 PEDS (DRAPES) ×2 IMPLANT
DRAPE MICROSCOPE LEICA (MISCELLANEOUS) IMPLANT
DRAPE POUCH INSTRU U-SHP 10X18 (DRAPES) ×2 IMPLANT
DRAPE SURG 17X23 STRL (DRAPES) ×6 IMPLANT
DRILL NEURO 2X3.1 SOFT TOUCH (MISCELLANEOUS) ×2
ELECT REM PT RETURN 9FT ADLT (ELECTROSURGICAL) ×2
ELECTRODE REM PT RTRN 9FT ADLT (ELECTROSURGICAL) ×1 IMPLANT
GAUZE SPONGE 4X4 12PLY STRL (GAUZE/BANDAGES/DRESSINGS) ×1 IMPLANT
GAUZE SPONGE 4X4 16PLY XRAY LF (GAUZE/BANDAGES/DRESSINGS) IMPLANT
GLOVE BIO SURGEON STRL SZ8 (GLOVE) ×4 IMPLANT
GLOVE BIO SURGEON STRL SZ8.5 (GLOVE) ×3 IMPLANT
GLOVE BIOGEL PI IND STRL 7.0 (GLOVE) IMPLANT
GLOVE BIOGEL PI IND STRL 7.5 (GLOVE) IMPLANT
GLOVE BIOGEL PI INDICATOR 7.0 (GLOVE) ×2
GLOVE BIOGEL PI INDICATOR 7.5 (GLOVE) ×1
GLOVE ECLIPSE 7.0 STRL STRAW (GLOVE) ×1 IMPLANT
GLOVE EXAM NITRILE LRG STRL (GLOVE) IMPLANT
GLOVE EXAM NITRILE XL STR (GLOVE) IMPLANT
GLOVE EXAM NITRILE XS STR PU (GLOVE) IMPLANT
GOWN STRL REUS W/ TWL LRG LVL3 (GOWN DISPOSABLE) IMPLANT
GOWN STRL REUS W/ TWL XL LVL3 (GOWN DISPOSABLE) ×1 IMPLANT
GOWN STRL REUS W/TWL 2XL LVL3 (GOWN DISPOSABLE) ×1 IMPLANT
GOWN STRL REUS W/TWL LRG LVL3 (GOWN DISPOSABLE) ×2
GOWN STRL REUS W/TWL XL LVL3 (GOWN DISPOSABLE) ×8
KIT BASIN OR (CUSTOM PROCEDURE TRAY) ×2 IMPLANT
KIT ROOM TURNOVER OR (KITS) ×2 IMPLANT
NDL HYPO 21X1 ECLIPSE (NEEDLE) IMPLANT
NDL SPNL 18GX3.5 QUINCKE PK (NEEDLE) IMPLANT
NEEDLE HYPO 21X1 ECLIPSE (NEEDLE) ×2 IMPLANT
NEEDLE HYPO 22GX1.5 SAFETY (NEEDLE) ×2 IMPLANT
NEEDLE SPNL 18GX3.5 QUINCKE PK (NEEDLE) ×2 IMPLANT
NS IRRIG 1000ML POUR BTL (IV SOLUTION) ×2 IMPLANT
OIL CARTRIDGE MAESTRO DRILL (MISCELLANEOUS) ×2
PACK LAMINECTOMY NEURO (CUSTOM PROCEDURE TRAY) ×2 IMPLANT
PAD ARMBOARD 7.5X6 YLW CONV (MISCELLANEOUS) ×6 IMPLANT
PATTIES SURGICAL .25X.25 (GAUZE/BANDAGES/DRESSINGS) ×1 IMPLANT
PIN MAYFIELD SKULL DISP (PIN) ×2 IMPLANT
PUTTY KINEX BIOACTIVE 2CC (Bone Implant) ×1 IMPLANT
ROD VIRAGE 3.5X35MM STRAIGHT (Cage) ×2 IMPLANT
RUBBERBAND STERILE (MISCELLANEOUS) IMPLANT
SCREW VIRAGE 3.5X26MM POLY (Cage) ×2 IMPLANT
SCREW VIRAGE SM SHANK 3.5X26MM (Screw) ×2 IMPLANT
SPONGE LAP 4X18 X RAY DECT (DISPOSABLE) IMPLANT
SPONGE NEURO XRAY DETECT 1X3 (DISPOSABLE) IMPLANT
SPONGE SURGIFOAM ABS GEL SZ50 (HEMOSTASIS) ×1 IMPLANT
STAPLER SKIN PROX WIDE 3.9 (STAPLE) ×1 IMPLANT
STRIP CLOSURE SKIN 1/2X4 (GAUZE/BANDAGES/DRESSINGS) ×1 IMPLANT
SUT ETHILON 2 0 FS 18 (SUTURE) ×1 IMPLANT
SUT VIC AB 0 CT1 18XCR BRD8 (SUTURE) ×1 IMPLANT
SUT VIC AB 0 CT1 8-18 (SUTURE) ×2
SUT VIC AB 2-0 CP2 18 (SUTURE) ×2 IMPLANT
SYRINGE 20CC LL (MISCELLANEOUS) ×1 IMPLANT
TAPE CLOTH SURG 4X10 WHT LF (GAUZE/BANDAGES/DRESSINGS) ×1 IMPLANT
TOWEL GREEN STERILE (TOWEL DISPOSABLE) ×2 IMPLANT
TOWEL GREEN STERILE FF (TOWEL DISPOSABLE) ×2 IMPLANT
TRAY FOLEY W/METER SILVER 16FR (SET/KITS/TRAYS/PACK) IMPLANT
WATER STERILE IRR 1000ML POUR (IV SOLUTION) ×2 IMPLANT

## 2018-01-16 NOTE — H&P (Signed)
Subjective: The patient is a 52 year old white female who has complained of neck pain.  She was worked up with x-rays which demonstrated an os odontoideum.  I discussed the various treatment options with the patient including surgery.  She has decided proceed with a C1 to posterior instrumentation and fusion.  Past Medical History:  Diagnosis Date  . Anemia    as child in first grade  . Anxiety   . Arthritis   . Back pain   . Constipation   . CREST syndrome (HCC)   . Dyspnea   . Family history of adverse reaction to anesthesia    mom has had problems in past - N/V post op on one occasion; difficult time waking up on another occasion  . Gallbladder problem   . GERD (gastroesophageal reflux disease)   . Gestational diabetes mellitus in childbirth, diet controlled 1987  . IBS (irritable bowel syndrome)   . Joint pain   . Leg edema   . Menopause   . Osteoarthritis (arthritis due to wear and tear of joints)   . PAH (pulmonary artery hypertension) (HCC)   . Pulmonary fibrosis (HCC)   . Raynaud disease    hands-toes  . Scleroderma (HCC)   . Shortness of breath dyspnea    pulmonary fibrosis-scleraderma  . Sleep apnea   . Swallowing difficulty   . Thyroid cyst   . Vitamin D deficiency   . Wears glasses     Past Surgical History:  Procedure Laterality Date  . CARDIAC CATHETERIZATION N/A 11/25/2015   Procedure: Right Heart Cath;  Surgeon: Dolores Patty, MD;  Location: [facility] INVASIVE CV LAB;  Service: Cardiovascular;  Laterality: N/A;  . CESAREAN SECTION  87,95  . CHOLECYSTECTOMY  1996   lap choli  . COLONOSCOPY    . ESOPHAGOGASTRODUODENOSCOPY (EGD) WITH PROPOFOL N/A 02/12/2017   Procedure: ESOPHAGOGASTRODUODENOSCOPY (EGD) WITH PROPOFOL;  Surgeon: Vida Rigger, MD;  Location: [facility];  Service: Endoscopy;  Laterality: N/A;  . HIATAL HERNIA REPAIR  07/26/2015   Procedure: LAPAROSCOPIC REPAIR OF HIATAL HERNIA;  Surgeon: Luretha Murphy, MD;  Location: [facility]S;  Service: General;;  .  LAPAROSCOPIC GASTRIC SLEEVE RESECTION N/A 07/26/2015   Procedure: LAPAROSCOPIC GASTRIC SLEEVE RESECTION;  Surgeon: Luretha Murphy, MD;  Location: [facility];  Service: General;  Laterality: N/A;  . LEFT AND RIGHT HEART CATHETERIZATION WITH CORONARY ANGIOGRAM N/A 03/14/2015   Procedure: LEFT AND RIGHT HEART CATHETERIZATION WITH CORONARY ANGIOGRAM;  Surgeon: Peter M Swaziland, MD;  Location: [facility] CATH LAB;  Service: Cardiovascular;  Laterality: N/A;  . RADIAL HEAD ARTHROPLASTY Right 07/28/2014   Procedure: RIGHT RADIAL HEAD REPLACEMENT;  Surgeon: Dairl Ponder, MD;  Location: [facility];  Service: Orthopedics;  Laterality: Right;  . RIGHT HEART CATH N/A 03/18/2017   Procedure: Right Heart Cath;  Surgeon: Dolores Patty, MD;  Location: [facility] INVASIVE CV LAB;  Service: Cardiovascular;  Laterality: N/A;  . SAVORY DILATION N/A 02/12/2017   Procedure: SAVORY DILATION;  Surgeon: Vida Rigger, MD;  Location: [facility];  Service: Endoscopy;  Laterality: N/A;  . TUBAL LIGATION    . UPPER GI ENDOSCOPY  07/26/2015   Procedure: UPPER GI ENDOSCOPY;  Surgeon: Luretha Murphy, MD;  Location: [facility];  Service: General;;  . VAGINAL HYSTERECTOMY  2010   LAVH    Allergies  Allergen Reactions  . Amoxicillin Other (See Comments)    Immediate yeast infection  Has patient had a PCN reaction causing immediate rash, facial/tongue/throat swelling, SOB or lightheadedness with hypotension: No  Has patient had a PCN reaction causing severe rash involving mucus membranes or skin necrosis: No Has patient had a PCN reaction that required hospitalization: No Has patient had a PCN reaction occurring within the last 10 years: No If all of the above answers are "NO", then may proceed with Cephalosporin use.   [name]. Percocet [Oxycodone-Acetaminophen] Itching    Tolerates acetaminophen alone  . [name] [Nitrofurantoin Monohyd Macro] Rash and Hives  . [name] Hives and Rash    Reported chicken pox-like rash, not hives  .  Sulfa Antibiotics Rash and Hives    Social History   Tobacco Use  . Smoking status: Never Smoker  . Smokeless tobacco: Never Used  Substance Use Topics  . Alcohol use: Yes    Alcohol/week: 0.0 oz    Comment: seldom    Family History  Problem Relation Age of Onset  . Heart failure Mother        Stent put in 2014 for blockage  . COPD Mother   . Hypertension Mother   . Hyperlipidemia Mother   . Heart disease Mother   . Thyroid disease Mother   . Cancer Mother   . Depression Mother   . Anxiety disorder Mother   . Heart disease Father   . Heart disease Son        Repair ASD  . Neuropathy Neg Hx    Prior to Admission medications   Medication Sig Start Date End Date Taking? Authorizing Provider  baclofen (LIORESAL) 10 MG tablet Take 1 tablet (10 mg total) by mouth 3 (three) times daily. Patient taking differently: Take 10 mg by mouth at bedtime.  11/13/17  Yes Anson FretAhern, Antonia B, MD  Biotin 5000 MCG CAPS Take 5,000 mcg by mouth daily.   Yes [provider]  buPROPion (WELLBUTRIN SR) 200 MG 12 hr tablet Take 1 tablet (200 mg total) by mouth daily. 01/08/18  Yes Beasley, Caren D, MD  Cyanocobalamin (VITAMIN B-12) 5000 MCG SUBL Take 5,000 mcg by mouth daily.   Yes [provider]  desloratadine (CLARINEX) 5 MG tablet Take 5 mg by mouth at bedtime.  10/29/14  Yes [provider]  diclofenac sodium (VOLTAREN) 1 % GEL Apply 2-4 g topically 4 (four) times daily as needed (for knee pain.).   Yes [provider]  fluticasone (FLONASE) 50 MCG/ACT nasal spray Place 1 spray into both nostrils at bedtime. Aller-Flo 11/26/13  Yes [provider]  furosemide (LASIX) 20 MG tablet TAKE 1 TABLET (20 MG TOTAL) BY MOUTH DAILY. 07/04/17  Yes Bensimhon, Bevelyn Bucklesaniel R, MD  gabapentin (NEURONTIN) 100 MG capsule Take 1 capsule (100 mg total) by mouth 3 (three) times daily. Patient taking differently: Take 100 mg by mouth See admin instructions. Take 1 capsule (100 mg) in  the morning, & 1 capsule (100 mg) at lunch if needed for pain. 11/25/17  Yes Anson FretAhern, Antonia B, MD  levothyroxine (SYNTHROID, LEVOTHROID) 25 MCG tablet Take 25 mcg by mouth every evening. 01/02/18  Yes [provider]  Macitentan (OPSUMIT) 10 MG TABS Take 1 tablet (10 mg total) by mouth daily. 07/22/17  Yes Bensimhon, Bevelyn Bucklesaniel R, MD  Multiple Vitamin (MULTIVITAMIN WITH MINERALS) TABS tablet Take 1 tablet by mouth daily.   Yes [provider]  Probiotic Product (PROBIOTIC PO) Take 1 capsule by mouth daily.   Yes [provider]  Vitamin D, Ergocalciferol, (DRISDOL) 50000 units CAPS capsule Take 1 capsule (50,000 Units total) by mouth every 7 (seven) days. Patient taking differently: Take  50,000 Units by mouth every Wednesday. In the morning. 12/31/17  Yes Beasley, Caren D, MD  acetaminophen (TYLENOL) 500 MG tablet Take 500-1,000 mg by mouth every 6 (six) hours as needed (for pain.).    [provider]     Review of Systems  Positive ROS: As above  All other systems have been reviewed and were otherwise negative with the exception of those mentioned in the HPI and as above.  Objective: Vital signs in last 24 hours: Temp:  [97.8 F (36.6 C)] 97.8 F (36.6 C) (01/24 0617) Pulse Rate:  [81] 81 (01/24 0556) Resp:  [18] 18 (01/24 0556) BP: (117)/(73) 117/73 (01/24 0556) SpO2:  [99 %] 99 % (01/24 0556) Weight:  [92.5 kg (204 lb)] 92.5 kg (204 lb) (01/24 0556) Estimated body mass index is 35.02 kg/m as calculated from the following:   Height as of this encounter: 5\' 4"  (1.626 m).   Weight as of this encounter: 92.5 kg (204 lb).   General Appearance: Alert Head: Normocephalic, without obvious abnormality, atraumatic Eyes: PERRL, conjunctiva/corneas clear, EOM's intact,    Ears: Normal  Throat: Normal  Neck: Supple, Back: unremarkable Lungs: Clear to auscultation bilaterally, respirations unlabored Heart: Regular rate and rhythm, no murmur, rub or  gallop Abdomen: Soft, non-tender Extremities: Extremities normal, atraumatic, no cyanosis or edema Skin: unremarkable  NEUROLOGIC:   Mental status: alert and oriented,Motor Exam - grossly normal Sensory Exam - grossly normal Reflexes:  Coordination - grossly normal Gait - grossly normal Balance - grossly normal Cranial Nerves: I: smell Not tested  II: visual acuity  OS: Normal  OD: Normal   II: visual fields Full to confrontation  II: pupils Equal, round, reactive to light  III,VII: ptosis None  III,IV,VI: extraocular muscles  Full ROM  V: mastication Normal  V: facial light touch sensation  Normal  V,VII: corneal reflex  Present  VII: facial muscle function - upper  Normal  VII: facial muscle function - lower Normal  VIII: hearing Not tested  IX: soft palate elevation  Normal  IX,X: gag reflex Present  XI: trapezius strength  5/5  XI: sternocleidomastoid strength 5/5  XI: neck flexion strength  5/5  XII: tongue strength  Normal    Data Review Lab Results  Component Value Date   WBC 6.2 01/13/2018   HGB 13.5 01/13/2018   HCT 40.7 01/13/2018   MCV 91.9 01/13/2018   PLT 220 01/13/2018   Lab Results  Component Value Date   NA 141 01/13/2018   K 3.6 01/13/2018   CL 106 01/13/2018   CO2 27 01/13/2018   BUN 13 01/13/2018   CREATININE 0.91 01/13/2018   GLUCOSE 86 01/13/2018   Lab Results  Component Value Date   INR 1.04 03/13/2017    Assessment/Plan: Os odontoideum, cervicalgia: I have discussed the situation with the patient.  I have reviewed her imaging studies with her and pointed out the abnormalities.  We have discussed the various treatment options including surgery.  I have described the surgical treatment option of a posterior C1-2 instrumentation and fusion.  I have shown her surgical models.  I have discussed the risks, benefits, alternatives, expected postoperative course, and likelihood of achieving our goals with surgery.  I have answered all the  patient's questions.  She has decided to proceed with surgery.   Cristi Loron 01/16/2018 7:16 AM

## 2018-01-16 NOTE — Op Note (Signed)
Brief history: The patient is a 52 year old white female who has complained of neck pain and headaches.  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  I discussed the various treatment options with the patient.  She has decided to proceed with a posterior C1-2 instrumentation and fusion after weighing the risks, benefits, and alternatives of surgery.  Preop diagnosis: Os odontoideum, atlantoaxial instability, cervicalgia  Postop diagnosis: The same  Procedure: Posterior C1-2 instrumentation and fusion with Zimmer team screws and rods, and Kinnex bone graft extender, and local morselized graft bone.  Surgeon: Dr. Delma OfficerJeff Waylyn Tenbrink  Assistant: Dr. Marikay Alaravid Jones  Anesthesia: General tracheal  Estimated blood loss: 100 cc  Specimens: None  Drains: None  Complications: None  Description of procedure: The patient was brought to the operating room by the anesthesia team.  General endotracheal anesthesia was induced.  I applied the Mayfield three-point headrest to the patient's calvarium.  She was then carefully turned to the prone position on the chest rolls with her head supported in the [place] three-point headrest.  We confirmed the neutral position at C1-2 using fluoroscopy.  The patient's suboccipital region was then shaved with clippers in this region as well as her posterior neck and thorax was prepared with Betadine scrub and Betadine solution.  Sterile drapes were applied.  I then injected the area to be incised with Marcaine with epinephrine solution.  I used the scalpel to make a midline incision over the C1-2 interspace.  I used electrocautery to perform a bilateral subperiosteal dissection exposing the occipital bone, C1 arch and the C2 spinous process lamina and facet.  Of note the C1 arch was quite mobile and appeared to be incomplete.  We used the cerebellar retractor for exposure.  I then  carefully dissected through the epidural veins just cephalad to the C2 nerve and identified the lateral masses of C1 bilaterally.  Using both AP and lateral intraoperative fluoroscopy we created a pilot hole in the lateral mass at C1 bilaterally.  We then used the drill guide and created a 20 mm pilot hole in the lateral mass of C1 bilaterally.  I tapped the hole we then inserted a 26 mm partially threaded screw into the lateral mass at C2 bilaterally.  We got good bony purchase.  We now turned attention to the C2 pars screws.  Under fluoroscopic guidance I created a pilot hole in the at C2 bilaterally.  I then used a drill guide to create a 26 mm pilot hole.  We probed inside the hole to record upper reaches rule out cortical breaches.  We then tapped the hole and inserted a 26 mm fully threaded screw into the C2 pars bilaterally.  Again we got good bony purchase.  We then connected the unilateral screws with a rod we secured the rod in place with the caps.  We tightened the caps appropriately.  This completed the instrumentation.  We now turned attention to the arthrodesis.  I used a high-speed drill to drill some of the lamina at C1 and C2 being local morselized bone.  I then laid Kinnex bone graft extender over the decorticated surfaces completing the posterior arthrodesis at C1-2.  We obtained hemostasis using bipolar electrocautery.  We removed the retractor.  I injected Exparel into the wound.  I then reapproximated patient's cervical fascia with interrupted 0 Vicryl suture.  I reapproximated the subcutaneous tissue with interrupted 2-0 Vicryl suture.  I reapproximate the skin with Steri-Strips and benzoin.  The wound was then coated with bacitracin ointment.  A sterile dressing was applied.  The drapes were removed.  The patient was then turned to the supine position.  I then removed the Mayfield three-point headrest from the patient's calvarium.  By report all sponge instrument and needle counts were  correct at the end of this case.

## 2018-01-16 NOTE — Progress Notes (Signed)
Patient ID: Rachael Camacho, female   DOB: Jun 20, 1966, 52 y.o.   MRN: [id number] Subjective: The patient is alert and pleasant.  She looks well.  Her neck is appropriately sore.  Objective: Vital signs in last 24 hours: Temp:  [97.7 F (36.5 C)-97.8 F (36.6 C)] 97.8 F (36.6 C) (01/24 1634) Pulse Rate:  [70-88] 70 (01/24 1634) Resp:  [12-18] 18 (01/24 1634) BP: (104-121)/(61-82) 118/73 (01/24 1634) SpO2:  [92 %-99 %] 93 % (01/24 1634) Weight:  [92.5 kg (204 lb)] 92.5 kg (204 lb) (01/24 0556) Estimated body mass index is 35.02 kg/m as calculated from the following:   Height as of this encounter: 5\' 4"  (1.626 m).   Weight as of this encounter: 92.5 kg (204 lb).   Intake/Output from previous day: No intake/output data recorded. Intake/Output this shift: Total I/O In: 1685 [P.O.:360; I.V.:1000; Other:75; IV Piggyback:250] Out: 50 [Blood:50]  Physical exam the patient is alert and oriented.  She is moving all 4 extremities well.  Lab Results: No results for input(s): WBC, HGB, HCT, PLT in the last 72 hours. BMET No results for input(s): NA, K, CL, CO2, GLUCOSE, BUN, CREATININE, CALCIUM in the last 72 hours.  Studies/Results: Dg Cervical Spine 2-3 Views  Result Date: 01/16/2018 CLINICAL DATA:  C1 tube posterior fusion EXAM: DG C-ARM 61-120 MIN; CERVICAL SPINE - 2-3 VIEW COMPARISON:  12/10/2017 FINDINGS: Multiple intraoperative spot images demonstrate placement of the screws posteriorly at C1 and C2. no visible hardware complicating feature. IMPRESSION: Posterior screw placement at C1 to for posterior fusion. No visible complicating feature on these intraoperative lateral spot images. Electronically Signed   By: Charlett NoseKevin  Dover M.D.   On: 01/16/2018 10:31   Dg C-arm 1-60 Min  Result Date: 01/16/2018 CLINICAL DATA:  C1 tube posterior fusion EXAM: DG C-ARM 61-120 MIN; CERVICAL SPINE - 2-3 VIEW COMPARISON:  12/10/2017 FINDINGS: Multiple intraoperative spot images demonstrate placement of  the screws posteriorly at C1 and C2. no visible hardware complicating feature. IMPRESSION: Posterior screw placement at C1 to for posterior fusion. No visible complicating feature on these intraoperative lateral spot images. Electronically Signed   By: Charlett NoseKevin  Dover M.D.   On: 01/16/2018 10:31   Dg C-arm 1-60 Min  Result Date: 01/16/2018 CLINICAL DATA:  C1 tube posterior fusion EXAM: DG C-ARM 61-120 MIN; CERVICAL SPINE - 2-3 VIEW COMPARISON:  12/10/2017 FINDINGS: Multiple intraoperative spot images demonstrate placement of the screws posteriorly at C1 and C2. no visible hardware complicating feature. IMPRESSION: Posterior screw placement at C1 to for posterior fusion. No visible complicating feature on these intraoperative lateral spot images. Electronically Signed   By: Charlett NoseKevin  Dover M.D.   On: 01/16/2018 10:31    Assessment/Plan: The patient is doing well.  LOS: 0 days     Cristi LoronJeffrey D Nikiah Goin 01/16/2018, 5:29 PM

## 2018-01-16 NOTE — Transfer of Care (Signed)
Immediate Anesthesia Transfer of Care Note  Patient: Rachael Camacho  Procedure(s) Performed: Cervical one-two Posterior instrumentation and fusion (N/A )  Patient Location: PACU  Anesthesia Type:General  Level of Consciousness: drowsy  Airway & Oxygen Therapy: Patient Spontanous Breathing and Patient connected to nasal cannula oxygen  Post-op Assessment: Report given to RN, Post -op Vital signs reviewed and stable and Patient moving all extremities X 4  Post vital signs: Reviewed and stable  Last Vitals:  Vitals:   01/16/18 0617 01/16/18 1100  BP:  121/78  Pulse:  86  Resp:  12  Temp: 36.6 C 36.5 C  SpO2:  98%    Last Pain:  Vitals:   01/16/18 1100  PainSc: 0-No pain      Patients Stated Pain Goal: 3 (01/16/18 0556)  Complications: No apparent anesthesia complications

## 2018-01-16 NOTE — Anesthesia Postprocedure Evaluation (Signed)
Anesthesia Post Note  Patient: Rachael Camacho  Procedure(s) Performed: Cervical one-two Posterior instrumentation and fusion (N/A )     Patient location during evaluation: PACU Anesthesia Type: General Level of consciousness: awake Pain management: pain level controlled Vital Signs Assessment: post-procedure vital signs reviewed and stable Respiratory status: spontaneous breathing Cardiovascular status: stable Anesthetic complications: no    Last Vitals:  Vitals:   01/16/18 1234 01/16/18 1300  BP: 111/71 110/82  Pulse: 71 83  Resp: 15 18  Temp: 36.6 C 36.5 C  SpO2: 92% 94%    Last Pain:  Vitals:   01/16/18 1255  PainSc: 3                  Khanh Cordner

## 2018-01-16 NOTE — Anesthesia Procedure Notes (Signed)
Procedure Name: Intubation Date/Time: 01/16/2018 7:38 AM Performed by: Valda Favia, CRNA Pre-anesthesia Checklist: Patient identified, Emergency Drugs available, Suction available and Patient being monitored Patient Re-evaluated:Patient Re-evaluated prior to induction Oxygen Delivery Method: Circle System Utilized Preoxygenation: Pre-oxygenation with 100% oxygen Induction Type: IV induction Ventilation: Mask ventilation without difficulty Laryngoscope Size: Mac and 4 Grade View: Grade I Tube type: Oral Tube size: 7.0 mm Number of attempts: 1 Airway Equipment and Method: Stylet and Oral airway Placement Confirmation: ETT inserted through vocal cords under direct vision,  positive ETCO2 and breath sounds checked- equal and bilateral Secured at: 21 cm Tube secured with: Tape Dental Injury: Teeth and Oropharynx as per pre-operative assessment

## 2018-01-16 NOTE — Anesthesia Postprocedure Evaluation (Signed)
Anesthesia Post Note  Patient: Rachael Camacho  Procedure(s) Performed: Cervical one-two Posterior instrumentation and fusion (N/A )     Patient location during evaluation: PACU Anesthesia Type: General Level of consciousness: awake Pain management: pain level controlled Vital Signs Assessment: post-procedure vital signs reviewed and stable Respiratory status: spontaneous breathing Cardiovascular status: stable Anesthetic complications: no    Last Vitals:  Vitals:   01/16/18 1300 01/16/18 1634  BP: 110/82 118/73  Pulse: 83 70  Resp: 18 18  Temp: 36.5 C 36.6 C  SpO2: 94% 93%    Last Pain:  Vitals:   01/16/18 1700  PainSc: 3                  Khloey Chern

## 2018-01-16 NOTE — Anesthesia Preprocedure Evaluation (Addendum)
Anesthesia Evaluation  Patient identified by MRN, date of birth, ID band Patient awake    Reviewed: Allergy & Precautions, NPO status , Patient's Chart, lab work & pertinent test results  Airway Mallampati: II  TM Distance: >3 FB     Dental   Pulmonary shortness of breath, sleep apnea ,    breath sounds clear to auscultation       Cardiovascular + Peripheral Vascular Disease   Rhythm:Regular Rate:Normal     Neuro/Psych    GI/Hepatic Neg liver ROS, GERD  ,  Endo/Other  diabetes  Renal/GU negative Renal ROS     Musculoskeletal   Abdominal   Peds  Hematology   Anesthesia Other Findings   Reproductive/Obstetrics                            Anesthesia Physical Anesthesia Plan  ASA: II  Anesthesia Plan: General   Post-op Pain Management:    Induction: Intravenous  PONV Risk Score and Plan: 3 and Ondansetron, Dexamethasone, Midazolam and Treatment may vary due to age or medical condition  Airway Management Planned:   Additional Equipment:   Intra-op Plan:   Post-operative Plan: Possible Post-op intubation/ventilation  Informed Consent: I have reviewed the patients History and Physical, chart, labs and discussed the procedure including the risks, benefits and alternatives for the proposed anesthesia with the patient or authorized representative who has indicated his/her understanding and acceptance.   Dental advisory given  Plan Discussed with: CRNA and Anesthesiologist  Anesthesia Plan Comments:         Anesthesia Quick Evaluation

## 2018-01-17 ENCOUNTER — Encounter (HOSPITAL_COMMUNITY): Payer: Self-pay | Admitting: Neurosurgery

## 2018-01-17 MED ORDER — DOCUSATE SODIUM 100 MG PO CAPS: 100.0000 mg | ORAL_CAPSULE | Freq: Two times a day (BID) | ORAL | 0 refills | Status: DC

## 2018-01-17 MED ORDER — TIZANIDINE HCL 4 MG PO TABS
4.0000 mg | ORAL_TABLET | Freq: Four times a day (QID) | ORAL | Status: DC | PRN
  Administered 2018-01-17: 4 mg via ORAL
  Filled 2018-01-17: qty 1

## 2018-01-17 MED ORDER — HYDROMORPHONE HCL 4 MG PO TABS: 4.0000 mg | ORAL_TABLET | ORAL | 0 refills | Status: DC | PRN

## 2018-01-17 MED ORDER — DIAZEPAM 5 MG PO TABS
5.0000 mg | ORAL_TABLET | Freq: Four times a day (QID) | ORAL | Status: DC | PRN
  Administered 2018-01-17: 5 mg via ORAL
  Filled 2018-01-17: qty 1

## 2018-01-17 NOTE — Discharge Summary (Signed)
Physician Discharge Summary  Patient ID: Rachael Camacho MRN: [id number] DOB/AGE: 1966/10/19 52 y.o.  Admit date: 01/16/2018 Discharge date: 01/17/2018  Admission Diagnoses: Os odontoideum, atlantoaxial instability  Discharge Diagnoses: The same Active Problems:   Os odontoideum   Discharged Condition: good  Hospital Course: I performed a posterior C1-2 instrumentation and fusion on patient on 01/16/2018.  Surgery went well.  The patient's postoperative course was unremarkable.  On postoperative day #1 she requested discharge her home.  She was given written and oral discharge instructions.  All her questions were answered.  Consults: Physical therapy Significant Diagnostic Studies: None Treatments: C1-2 posterior instrumentation and fusion. Discharge Exam: Blood pressure 126/67, pulse 73, temperature 98.3 F (36.8 C), temperature source Oral, resp. rate 18, height 5\' 4"  (1.626 m), weight 92.5 kg (204 lb), SpO2 95 %. The patient is alert and pleasant.  Her dressing is clean and dry.  She is moving all 4 extremities well.  Disposition: Home  Discharge Instructions    Call MD for:  difficulty breathing, headache or visual disturbances   Complete by:  As directed    Call MD for:  extreme fatigue   Complete by:  As directed    Call MD for:  hives   Complete by:  As directed    Call MD for:  persistant dizziness or light-headedness   Complete by:  As directed    Call MD for:  persistant nausea and vomiting   Complete by:  As directed    Call MD for:  redness, tenderness, or signs of infection (pain, swelling, redness, odor or green/yellow discharge around incision site)   Complete by:  As directed    Call MD for:  severe uncontrolled pain   Complete by:  As directed    Call MD for:  temperature >100.4   Complete by:  As directed    Diet - low sodium heart healthy   Complete by:  As directed    Discharge instructions   Complete by:  As directed    Call [phone] for a  followup appointment. Take a stool softener while you are using pain medications.   Driving Restrictions   Complete by:  As directed    Do not drive for 2 weeks.   Increase activity slowly   Complete by:  As directed    Lifting restrictions   Complete by:  As directed    Do not lift more than 5 pounds. No excessive bending or twisting.   May shower / Bathe   Complete by:  As directed    He may shower after the pain she is removed 3 days after surgery. Leave the incision alone.   Remove dressing in 48 hours   Complete by:  As directed    Your stitches are under the scan and will dissolve by themselves. The Steri-Strips will fall off after you take a few showers. Do not rub back or pick at the wound, Leave the wound alone.     Allergies as of 01/17/2018      Reactions   Amoxicillin Other (See Comments)   Immediate yeast infection  Has patient had a PCN reaction causing immediate rash, facial/tongue/throat swelling, SOB or lightheadedness with hypotension: No Has patient had a PCN reaction causing severe rash involving mucus membranes or skin necrosis: No Has patient had a PCN reaction that required hospitalization: No Has patient had a PCN reaction occurring within the last 10 years: No If all of the above answers are "NO",  then may proceed with Cephalosporin use.   Percocet [oxycodone-acetaminophen] Itching   Tolerates acetaminophen alone   [name] [nitrofurantoin Monohyd Macro] Rash, Hives   [name] Hives, Rash   Reported chicken pox-like rash, not hives   Sulfa Antibiotics Rash, Hives      Medication List    STOP taking these medications   acetaminophen 500 MG tablet Commonly known as:  TYLENOL     TAKE these medications   baclofen 10 MG tablet Commonly known as:  LIORESAL Take 1 tablet (10 mg total) by mouth 3 (three) times daily. What changed:  when to take this   Biotin 5000 MCG Caps Take 5,000 mcg by mouth daily.   buPROPion 200 MG 12 hr tablet Commonly  known as:  WELLBUTRIN SR Take 1 tablet (200 mg total) by mouth daily.   desloratadine 5 MG tablet Commonly known as:  CLARINEX Take 5 mg by mouth at bedtime.   docusate sodium 100 MG capsule Commonly known as:  COLACE Take 1 capsule (100 mg total) by mouth 2 (two) times daily.   fluticasone 50 MCG/ACT nasal spray Commonly known as:  FLONASE Place 1 spray into both nostrils at bedtime. Aller-Flo   furosemide 20 MG tablet Commonly known as:  LASIX TAKE 1 TABLET (20 MG TOTAL) BY MOUTH DAILY.   gabapentin 100 MG capsule Commonly known as:  NEURONTIN Take 1 capsule (100 mg total) by mouth 3 (three) times daily. What changed:    when to take this  additional instructions   HYDROmorphone 4 MG tablet Commonly known as:  DILAUDID Take 1 tablet (4 mg total) by mouth every 4 (four) hours as needed for severe pain.   levothyroxine 25 MCG tablet Commonly known as:  SYNTHROID, LEVOTHROID Take 25 mcg by mouth every evening.   Macitentan 10 MG Tabs Commonly known as:  OPSUMIT Take 1 tablet (10 mg total) by mouth daily.   multivitamin with minerals Tabs tablet Take 1 tablet by mouth daily.   PROBIOTIC PO Take 1 capsule by mouth daily.   Vitamin B-12 5000 MCG Subl Take 5,000 mcg by mouth daily.   Vitamin D (Ergocalciferol) 50000 units Caps capsule Commonly known as:  DRISDOL Take 1 capsule (50,000 Units total) by mouth every 7 (seven) days. What changed:    when to take this  additional instructions   VOLTAREN 1 % Gel Generic drug:  diclofenac sodium Apply 2-4 g topically 4 (four) times daily as needed (for knee pain.).        Signed: Cristi LoronJeffrey D Hallel Denherder 01/17/2018, 8:01 AM

## 2018-01-17 NOTE — Progress Notes (Signed)
Patient alert and oriented, mae's well, voiding adequate amount of urine, swallowing without difficulty,  C/o mild pain at time of discharge. Patient discharged home with family. Script and discharged instructions given to patient. Patient and family stated understanding of instructions given. Patient has an appointment with Dr. Lovell Sheehanjenkins

## 2018-01-24 ENCOUNTER — Telehealth: Payer: Self-pay | Admitting: Neurology

## 2018-01-24 NOTE — Telephone Encounter (Addendum)
Called patient. She stated that she recently had surgery on her neck. Two nights ago she noticed that her R arm was tingling. She called Dr. Lovell SheehanJenkins yesterday and stated that he advised her to take Gabapentin TID & Baclofen TID which she has done. She spoke to a nurse through her husband's insurance who suggested therapy. I advised patient to call and discuss this with Dr. Lovell SheehanJenkins who just performed her surgery as they follow her through her recovery and will best help this patient. She verbalized understanding and appreciation and will contact Dr. Ernie AvenaJenkins's office.

## 2018-01-24 NOTE — Telephone Encounter (Signed)
Pt calling to make Dr Lucia GaskinsAhern aware that on last Thurs pt had her C1C2 surgery and she is experiencing some concerning tingling in her right hand, pt is asking to be called

## 2018-01-27 ENCOUNTER — Ambulatory Visit (INDEPENDENT_AMBULATORY_CARE_PROVIDER_SITE_OTHER): Payer: BLUE CROSS/BLUE SHIELD | Admitting: Family Medicine

## 2018-02-03 ENCOUNTER — Ambulatory Visit (INDEPENDENT_AMBULATORY_CARE_PROVIDER_SITE_OTHER): Payer: BLUE CROSS/BLUE SHIELD | Admitting: Family Medicine

## 2018-02-03 VITALS — BP 112/73 | HR 65 | Temp 98.0°F | Ht 64.0 in | Wt 195.0 lb

## 2018-02-03 DIAGNOSIS — E669 Obesity, unspecified: Secondary | ICD-10-CM | POA: Diagnosis not present

## 2018-02-03 DIAGNOSIS — Z6833 Body mass index (BMI) 33.0-33.9, adult: Secondary | ICD-10-CM | POA: Diagnosis not present

## 2018-02-03 DIAGNOSIS — E559 Vitamin D deficiency, unspecified: Secondary | ICD-10-CM

## 2018-02-03 NOTE — Progress Notes (Signed)
Office: [phone]  /  Fax: [phone]   HPI:   Chief Complaint: OBESITY Rachael Camacho is here to discuss her progress with her obesity treatment plan. She is on the keep a food journal with 1100 to 1300 calories and 75+ grams of protein daily and is following her eating plan approximately 0 % of the time. She states she is exercising 0 minutes 0 times per week. Rachael Camacho is doing well with weight loss. She has been recovering from surgery and is not journaling. She is mostly controlling her portions and making smarter food choices. Rachael Camacho has skipped meals more frequently. Her weight is 195 lb (88.5 kg) today and has had a weight loss of 9 pounds over a period of 5 weeks since her last visit. She has lost 12 lbs since starting treatment with [place].  Vitamin D deficiency Rachael Camacho has a diagnosis of vitamin D deficiency. Rachael Camacho is on vit D and is doing well. Her last level was at goal and she denies nausea, vomiting or muscle weakness.   Ref. Range 12/10/2017 09:55  Vitamin D, 25-Hydroxy Latest Ref Range: 30.0 - 100.0 ng/mL 51.3    ALLERGIES: Allergies  Allergen Reactions  . Amoxicillin Other (See Comments)    Immediate yeast infection  Has patient had a PCN reaction causing immediate rash, facial/tongue/throat swelling, SOB or lightheadedness with hypotension: No Has patient had a PCN reaction causing severe rash involving mucus membranes or skin necrosis: No Has patient had a PCN reaction that required hospitalization: No Has patient had a PCN reaction occurring within the last 10 years: No If all of the above answers are "NO", then may proceed with Cephalosporin use.   [name] Percocet [Oxycodone-Acetaminophen] Itching    Tolerates acetaminophen alone  . [name] [Nitrofurantoin Monohyd Macro] Rash and Hives  . [name] Hives and Rash    Reported chicken pox-like rash, not hives  . Sulfa Antibiotics Rash and Hives    MEDICATIONS: Current Outpatient Medications on File Prior to Visit    Medication Sig Dispense Refill  . baclofen (LIORESAL) 10 MG tablet Take 1 tablet (10 mg total) by mouth 3 (three) times daily. (Patient taking differently: Take 10 mg by mouth at bedtime. ) 90 each 11  . Biotin 5000 MCG CAPS Take 5,000 mcg by mouth daily.    [name] buPROPion (WELLBUTRIN SR) 200 MG 12 hr tablet Take 1 tablet (200 mg total) by mouth daily. 30 tablet 0  . Cyanocobalamin (VITAMIN B-12) 5000 MCG SUBL Take 5,000 mcg by mouth daily.    [name] desloratadine (CLARINEX) 5 MG tablet Take 5 mg by mouth at bedtime.   12  . diclofenac sodium (VOLTAREN) 1 % GEL Apply 2-4 g topically 4 (four) times daily as needed (for knee pain.).    [name] docusate sodium (COLACE) 100 MG capsule Take 1 capsule (100 mg total) by mouth 2 (two) times daily. 60 capsule 0  . fluticasone (FLONASE) 50 MCG/ACT nasal spray Place 1 spray into both nostrils at bedtime. Aller-Flo    . furosemide (LASIX) 20 MG tablet TAKE 1 TABLET (20 MG TOTAL) BY MOUTH DAILY. 90 tablet 3  . gabapentin (NEURONTIN) 100 MG capsule Take 1 capsule (100 mg total) by mouth 3 (three) times daily. (Patient taking differently: Take 100 mg by mouth See admin instructions. Take 1 capsule (100 mg) in the morning, & 1 capsule (100 mg) at lunch if needed for pain.) 90 capsule 6  . HYDROmorphone (DILAUDID) 4 MG tablet Take 1 tablet (4 mg total) by mouth  every 4 (four) hours as needed for severe pain. 30 tablet 0  . levothyroxine (SYNTHROID, LEVOTHROID) 25 MCG tablet Take 25 mcg by mouth every evening.  6  . Macitentan (OPSUMIT) 10 MG TABS Take 1 tablet (10 mg total) by mouth daily. 90 tablet 3  . Multiple Vitamin (MULTIVITAMIN WITH MINERALS) TABS tablet Take 1 tablet by mouth daily.    . Probiotic Product (PROBIOTIC PO) Take 1 capsule by mouth daily.    . Vitamin D, Ergocalciferol, (DRISDOL) 50000 units CAPS capsule Take 1 capsule (50,000 Units total) by mouth every 7 (seven) days. (Patient taking differently: Take 50,000 Units by mouth every Wednesday. In the morning.)  4 capsule 1   No current facility-administered medications on file prior to visit.     PAST MEDICAL HISTORY: Past Medical History:  Diagnosis Date  . Anemia    as child in first grade  . Anxiety   . Arthritis   . Back pain   . Constipation   . CREST syndrome (HCC)   . Dyspnea   . Family history of adverse reaction to anesthesia    mom has had problems in past - N/V post op on one occasion; difficult time waking up on another occasion  . Gallbladder problem   . GERD (gastroesophageal reflux disease)   . Gestational diabetes mellitus in childbirth, diet controlled 1987  . IBS (irritable bowel syndrome)   . Joint pain   . Leg edema   . Menopause   . Osteoarthritis (arthritis due to wear and tear of joints)   . PAH (pulmonary artery hypertension) (HCC)   . Pulmonary fibrosis (HCC)   . Raynaud disease    hands-toes  . Scleroderma (HCC)   . Shortness of breath dyspnea    pulmonary fibrosis-scleraderma  . Sleep apnea   . Swallowing difficulty   . Thyroid cyst   . Vitamin D deficiency   . Wears glasses     PAST SURGICAL HISTORY: Past Surgical History:  Procedure Laterality Date  . CARDIAC CATHETERIZATION N/A 11/25/2015   Procedure: Right Heart Cath;  Surgeon: Dolores Patty, MD;  Location: [facility] INVASIVE CV LAB;  Service: Cardiovascular;  Laterality: N/A;  . CESAREAN SECTION  87,95  . CHOLECYSTECTOMY  1996   lap choli  . COLONOSCOPY    . ESOPHAGOGASTRODUODENOSCOPY (EGD) WITH PROPOFOL N/A 02/12/2017   Procedure: ESOPHAGOGASTRODUODENOSCOPY (EGD) WITH PROPOFOL;  Surgeon: Vida Rigger, MD;  Location: [facility] ENDOSCOPY;  Service: Endoscopy;  Laterality: N/A;  . HIATAL HERNIA REPAIR  07/26/2015   Procedure: LAPAROSCOPIC REPAIR OF HIATAL HERNIA;  Surgeon: Luretha Murphy, MD;  Location: [facility] ORS;  Service: General;;  . LAPAROSCOPIC GASTRIC SLEEVE RESECTION N/A 07/26/2015   Procedure: LAPAROSCOPIC GASTRIC SLEEVE RESECTION;  Surgeon: Luretha Murphy, MD;  Location: [facility] ORS;  Service: General;   Laterality: N/A;  . LEFT AND RIGHT HEART CATHETERIZATION WITH CORONARY ANGIOGRAM N/A 03/14/2015   Procedure: LEFT AND RIGHT HEART CATHETERIZATION WITH CORONARY ANGIOGRAM;  Surgeon: Peter M Swaziland, MD;  Location: [facility] CATH LAB;  Service: Cardiovascular;  Laterality: N/A;  . POSTERIOR CERVICAL FUSION/FORAMINOTOMY N/A 01/16/2018   Procedure: Cervical one-two Posterior instrumentation and fusion;  Surgeon: Tressie Stalker, MD;  Location[facility] OR;  Service: Neurosurgery;  Laterality: N/A;  . RADIAL HEAD ARTHROPLASTY Right 07/28/2014   Procedure: RIGHT RADIAL HEAD REPLACEMENT;  Surgeon: Dairl Ponder, MD;  Location: [facility];  Service: Orthopedics;  Laterality: Right;  . RIGHT HEART CATH N/A 03/18/2017   Procedure: Right Heart Cath;  Surgeon: Reuel Boom  R Bensimhon, MD;  Location: [facility] INVASIVE CV LAB;  Service: Cardiovascular;  Laterality: N/A;  . SAVORY DILATION N/A 02/12/2017   Procedure: SAVORY DILATION;  Surgeon: Vida Rigger, MD;  Location: [facility] ENDOSCOPY;  Service: Endoscopy;  Laterality: N/A;  . TUBAL LIGATION    . UPPER GI ENDOSCOPY  07/26/2015   Procedure: UPPER GI ENDOSCOPY;  Surgeon: Luretha Murphy, MD;  Location: [facility] ORS;  Service: General;;  . VAGINAL HYSTERECTOMY  2010   [facility]    SOCIAL HISTORY: Social History   Tobacco Use  . Smoking status: Never Smoker  . Smokeless tobacco: Never Used  Substance Use Topics  . Alcohol use: Yes    Alcohol/week: 0.0 oz    Comment: seldom  . Drug use: No    FAMILY HISTORY: Family History  Problem Relation Age of Onset  . Heart failure Mother        Stent put in 2014 for blockage  . COPD Mother   . Hypertension Mother   . Hyperlipidemia Mother   . Heart disease Mother   . Thyroid disease Mother   . Cancer Mother   . Depression Mother   . Anxiety disorder Mother   . Heart disease Father   . Heart disease Son        Repair ASD  . Neuropathy Neg Hx     ROS: Review of Systems  Constitutional: Positive for weight loss.    Gastrointestinal: Negative for nausea and vomiting.  Musculoskeletal:       Negative for muscle weakness    PHYSICAL EXAM: Blood pressure 112/73, pulse 65, temperature 98 F (36.7 C), temperature source Oral, height 5\' 4"  (1.626 m), weight 195 lb (88.5 kg), SpO2 95 %. Body mass index is 33.47 kg/m. Physical Exam  Constitutional: She is oriented to person, place, and time. She appears well-developed and well-nourished.  Cardiovascular: Normal rate.  Pulmonary/Chest: Effort normal.  Musculoskeletal: Normal range of motion.  Neurological: She is oriented to person, place, and time.  Skin: Skin is warm and dry.  Psychiatric: She has a normal mood and affect. Her behavior is normal.  Vitals reviewed.   RECENT LABS AND TESTS: BMET    Component Value Date/Time   NA 141 01/13/2018 1000   NA 143 12/10/2017 0955   K 3.6 01/13/2018 1000   CL 106 01/13/2018 1000   CO2 27 01/13/2018 1000   GLUCOSE 86 01/13/2018 1000   BUN 13 01/13/2018 1000   BUN 14 12/10/2017 0955   CREATININE 0.91 01/13/2018 1000   CREATININE 0.75 03/08/2015 1501   CALCIUM 9.2 01/13/2018 1000   GFRNONAA >60 01/13/2018 1000   GFRNONAA >89 03/08/2015 1501   GFRAA >60 01/13/2018 1000   GFRAA >89 03/08/2015 1501   Lab Results  Component Value Date   HGBA1C 4.9 12/10/2017   HGBA1C 5.0 09/10/2017   Lab Results  Component Value Date   INSULIN 4.4 12/10/2017   INSULIN 6.1 09/10/2017   CBC    Component Value Date/Time   WBC 6.2 01/13/2018 1000   RBC 4.43 01/13/2018 1000   HGB 13.5 01/13/2018 1000   HGB 13.9 09/10/2017 1108   HCT 40.7 01/13/2018 1000   HCT 41.6 09/10/2017 1108   PLT 220 01/13/2018 1000   MCV 91.9 01/13/2018 1000   MCV 90 09/10/2017 1108   MCH 30.5 01/13/2018 1000   MCHC 33.2 01/13/2018 1000   RDW 13.4 01/13/2018 1000   RDW 14.1 09/10/2017 1108   LYMPHSABS 2.3 09/10/2017 1108   MONOABS 0.9  07/28/2015 0455   EOSABS 0.2 09/10/2017 1108   BASOSABS 0.0 09/10/2017 1108    Iron/TIBC/Ferritin/ %Sat No results found for: IRON, TIBC, FERRITIN, IRONPCTSAT Lipid Panel     Component Value Date/Time   CHOL 151 09/10/2017 1108   TRIG 102 09/10/2017 1108   HDL 70 09/10/2017 1108   LDLCALC 61 09/10/2017 1108   Hepatic Function Panel     Component Value Date/Time   PROT 6.3 12/10/2017 0955   ALBUMIN 4.2 12/10/2017 0955   AST 18 12/10/2017 0955   ALT 15 12/10/2017 0955   ALKPHOS 85 12/10/2017 0955   BILITOT 0.5 12/10/2017 0955      Component Value Date/Time   TSH 2.690 09/10/2017 1108    Ref. Range 12/10/2017 09:55  Vitamin D, 25-Hydroxy Latest Ref Range: 30.0 - 100.0 ng/mL 51.3   ASSESSMENT AND PLAN: Vitamin D deficiency  Class 1 obesity with serious comorbidity and body mass index (BMI) of 33.0 to 33.9 in adult, unspecified obesity type  PLAN:  Vitamin D Deficiency Rachael Camacho was informed that low vitamin D levels contributes to fatigue and are associated with obesity, breast, and colon cancer. She agrees to continue to take prescription Vit D @50 ,000 IU every week for now. We will recheck labs at her next visit and will follow up for routine testing of vitamin D, at least 2-3 times per year. She was informed of the risk of over-replacement of vitamin D and agrees to not increase her dose unless she discusses this with us first.  We spent > than 50% of the 15 minute visit on the counseling as documented in the note.  Obesity Rachael Camacho is currently in the action stage of change. As such, her goal is to continue with weight loss efforts She has agreed to keep a food journal with 1000 to 1300 calories and 75+ grams of protein daily Rachael Camacho has been instructed to work up to a goal of 150 minutes of combined cardio and strengthening exercise per week or start back to cardio  for weight loss and overall health benefits. We discussed the following Behavioral Modification Strategies today: increasing lean protein intake and no skipping meals  Rachael Camacho has agreed  to follow up with our clinic in 3 weeks. She was informed of the importance of frequent follow up visits to maximize her success with intensive lifestyle modifications for her multiple health conditions.   OBESITY BEHAVIORAL INTERVENTION VISIT  Today's visit was # 8 out of 22.  Starting weight: 207 lbs Starting date: 09/10/17 Today's weight : 195 lbs Today's date: 02/03/2018 Total lbs lost to date: 12 (Patients must lose 7 lbs in the first 6 months to continue with counseling)   ASK: We discussed the diagnosis of obesity with Rachael Camacho today and Rachael Camacho agreed to give us permission to discuss obesity behavioral modification therapy today.  ASSESS: Rachael Camacho has the diagnosis of obesity and her BMI today is 33.46 Rachael Camacho is in the action stage of change   ADVISE: Rachael Camacho was educated on the multiple health risks of obesity as well as the benefit of weight loss to improve her health. She was advised of the need for long term treatment and the importance of lifestyle modifications.  AGREE: Multiple dietary modification options and treatment options were discussed and  Rachael Camacho agreed to the above obesity treatment plan.  I, [place] CraneJoanne Murray, am acting as transcriptionist for Quillian Quincearen Kelcy Baeten, MD  I have reviewed the above documentation for accuracy and completeness, and I agree with the above. -  Dennard Nip, MD

## 2018-02-07 ENCOUNTER — Other Ambulatory Visit (INDEPENDENT_AMBULATORY_CARE_PROVIDER_SITE_OTHER): Payer: Self-pay | Admitting: Family Medicine

## 2018-02-07 DIAGNOSIS — F3289 Other specified depressive episodes: Secondary | ICD-10-CM

## 2018-02-18 ENCOUNTER — Encounter (HOSPITAL_COMMUNITY): Payer: Self-pay

## 2018-02-24 ENCOUNTER — Ambulatory Visit (INDEPENDENT_AMBULATORY_CARE_PROVIDER_SITE_OTHER): Payer: BLUE CROSS/BLUE SHIELD | Admitting: Family Medicine

## 2018-02-24 VITALS — BP 109/65 | HR 76 | Temp 98.1°F | Ht 64.0 in | Wt 203.0 lb

## 2018-02-24 DIAGNOSIS — Z6835 Body mass index (BMI) 35.0-35.9, adult: Secondary | ICD-10-CM

## 2018-02-24 DIAGNOSIS — E559 Vitamin D deficiency, unspecified: Secondary | ICD-10-CM | POA: Diagnosis not present

## 2018-02-24 DIAGNOSIS — F3289 Other specified depressive episodes: Secondary | ICD-10-CM | POA: Diagnosis not present

## 2018-02-24 DIAGNOSIS — Z9189 Other specified personal risk factors, not elsewhere classified: Secondary | ICD-10-CM | POA: Diagnosis not present

## 2018-02-25 NOTE — Progress Notes (Signed)
Office: [phone]  /  Fax: [phone]   HPI:   Chief Complaint: OBESITY Rachael Camacho is here to discuss her progress with her obesity treatment plan. She is on the keep a food journal with 1000 to 1300 calories and 75+ grams of protein daily and is following her eating plan approximately 30 % of the time. She states she is exercising 0 minutes 0 times per week. Rachael Camacho states, hunger has increased after surgery, when she couldn't eat much initially. Rachael Camacho hasn't been planning ahead of time, but she is now ready to get back on track. Her weight is 203 lb (92.1 kg) today and has had a weight gain of 8 pounds over a period of 3 weeks since her last visit. She has lost 4 lbs since starting treatment with us.  Vitamin D deficiency Rachael Camacho has a diagnosis of vitamin D deficiency and her last level was at goal on vitamin D. She denies nausea, vomiting or muscle weakness.   Ref. Range 12/10/2017 09:55  Vitamin D, 25-Hydroxy Latest Ref Range: 30.0 - 100.0 ng/mL 51.3   At risk for osteopenia and osteoporosis Rachael Camacho is at higher risk of osteopenia and osteoporosis due to vitamin D deficiency.   Depression with emotional eating behaviors Rachael Camacho is struggling with emotional eating and using food for comfort to the extent that it is negatively impacting her health. She often snacks when she is not hungry. Rachael Camacho sometimes feels she is out of control and then feels guilty that she made poor food choices. She has been working on behavior modification techniques to help reduce her emotional eating and has been somewhat successful. Her mood is stable on wellbutrin and she has no worsening insomnia. Rachael Camacho shows no sign of suicidal or homicidal ideations.  [facility] 09/10/2017 10/18/2016 07/18/2016 05/02/2016 03/01/2016  Decreased Interest 2 0 0 0 0  Down, Depressed, Hopeless 2 0 0 0 0  PHQ - 2 Score 4 0 0 0 0  Altered sleeping 1 - - - -  Tired, decreased energy 3 - - - -  Change in appetite 1  - - - -  Feeling bad or failure about yourself  3 - - - -  Trouble concentrating 2 - - - -  Moving slowly or fidgety/restless 2 - - - -  Suicidal thoughts 0 - - - -  PHQ-9 Score 16 - - - -  Difficult doing work/chores Somewhat difficult - - - -    ALLERGIES: Allergies  Allergen Reactions  . Amoxicillin Other (See Comments)    Immediate yeast infection  Has patient had a PCN reaction causing immediate rash, facial/tongue/throat swelling, SOB or lightheadedness with hypotension: No Has patient had a PCN reaction causing severe rash involving mucus membranes or skin necrosis: No Has patient had a PCN reaction that required hospitalization: No Has patient had a PCN reaction occurring within the last 10 years: No If all of the above answers are "NO", then may proceed with Cephalosporin use.   [name]. Percocet [Oxycodone-Acetaminophen] Itching    Tolerates acetaminophen alone  . [name] [Nitrofurantoin Monohyd Macro] Rash and Hives  . [name] Hives and Rash    Reported chicken pox-like rash, not hives  . Sulfa Antibiotics Rash and Hives    MEDICATIONS: Current Outpatient Medications on File Prior to Visit  Medication Sig Dispense Refill  . baclofen (LIORESAL) 10 MG tablet Take 1 tablet (10 mg total) by mouth 3 (three) times daily. (Patient taking differently: Take 10 mg by mouth at  bedtime. ) 90 each 11  . Biotin 5000 MCG CAPS Take 5,000 mcg by mouth daily.    [name] buPROPion (WELLBUTRIN SR) 200 MG 12 hr tablet Take 1 tablet (200 mg total) by mouth daily. 30 tablet 0  . Cyanocobalamin (VITAMIN B-12) 5000 MCG SUBL Take 5,000 mcg by mouth daily.    [name] desloratadine (CLARINEX) 5 MG tablet Take 5 mg by mouth at bedtime.   12  . diclofenac sodium (VOLTAREN) 1 % GEL Apply 2-4 g topically 4 (four) times daily as needed (for knee pain.).    [name] fluticasone (FLONASE) 50 MCG/ACT nasal spray Place 1 spray into both nostrils at bedtime. Aller-Flo    . furosemide (LASIX) 20 MG tablet TAKE 1 TABLET (20 MG  TOTAL) BY MOUTH DAILY. 90 tablet 3  . gabapentin (NEURONTIN) 100 MG capsule Take 1 capsule (100 mg total) by mouth 3 (three) times daily. (Patient taking differently: Take 100 mg by mouth See admin instructions. Take 1 capsule (100 mg) in the morning, & 1 capsule (100 mg) at lunch if needed for pain.) 90 capsule 6  . levothyroxine (SYNTHROID, LEVOTHROID) 25 MCG tablet Take 25 mcg by mouth daily before breakfast.   6  . Macitentan (OPSUMIT) 10 MG TABS Take 1 tablet (10 mg total) by mouth daily. 90 tablet 3  . Multiple Vitamin (MULTIVITAMIN WITH MINERALS) TABS tablet Take 1 tablet by mouth daily.    . Probiotic Product (PROBIOTIC PO) Take 1 capsule by mouth daily.    . Vitamin D, Ergocalciferol, (DRISDOL) 50000 units CAPS capsule Take 1 capsule (50,000 Units total) by mouth every 7 (seven) days. (Patient taking differently: Take 50,000 Units by mouth every 7 (seven) days. In the morning.) 4 capsule 1   No current facility-administered medications on file prior to visit.     PAST MEDICAL HISTORY: Past Medical History:  Diagnosis Date  . Anemia    as child in first grade  . Anxiety   . Arthritis   . Back pain   . Constipation   . CREST syndrome (HCC)   . Dyspnea   . Family history of adverse reaction to anesthesia    mom has had problems in past - N/V post op on one occasion; difficult time waking up on another occasion  . Gallbladder problem   . GERD (gastroesophageal reflux disease)   . Gestational diabetes mellitus in childbirth, diet controlled 1987  . IBS (irritable bowel syndrome)   . Joint pain   . Leg edema   . Menopause   . Osteoarthritis (arthritis due to wear and tear of joints)   . PAH (pulmonary artery hypertension) (HCC)   . Pulmonary fibrosis (HCC)   . Raynaud disease    hands-toes  . Scleroderma (HCC)   . Shortness of breath dyspnea    pulmonary fibrosis-scleraderma  . Sleep apnea   . Swallowing difficulty   . Thyroid cyst   . Vitamin D deficiency   . Wears  glasses     PAST SURGICAL HISTORY: Past Surgical History:  Procedure Laterality Date  . CARDIAC CATHETERIZATION N/A 11/25/2015   Procedure: Right Heart Cath;  Surgeon: Dolores Patty, MD;  Location: [facility] INVASIVE CV LAB;  Service: Cardiovascular;  Laterality: N/A;  . CESAREAN SECTION  87,95  . CHOLECYSTECTOMY  1996   lap choli  . COLONOSCOPY    . ESOPHAGOGASTRODUODENOSCOPY (EGD) WITH PROPOFOL N/A 02/12/2017   Procedure: ESOPHAGOGASTRODUODENOSCOPY (EGD) WITH PROPOFOL;  Surgeon: Vida Rigger, MD;  Location: [facility] ENDOSCOPY;  Service:  Endoscopy;  Laterality: N/A;  . HIATAL HERNIA REPAIR  07/26/2015   Procedure: LAPAROSCOPIC REPAIR OF HIATAL HERNIA;  Surgeon: Luretha Murphy, MD;  Location: [facility] ORS;  Service: General;;  . LAPAROSCOPIC GASTRIC SLEEVE RESECTION N/A 07/26/2015   Procedure: LAPAROSCOPIC GASTRIC SLEEVE RESECTION;  Surgeon: Luretha Murphy, MD;  Location: [facility] ORS;  Service: General;  Laterality: N/A;  . LEFT AND RIGHT HEART CATHETERIZATION WITH CORONARY ANGIOGRAM N/A 03/14/2015   Procedure: LEFT AND RIGHT HEART CATHETERIZATION WITH CORONARY ANGIOGRAM;  Surgeon: Peter M Swaziland, MD;  Location: [facility] CATH LAB;  Service: Cardiovascular;  Laterality: N/A;  . POSTERIOR CERVICAL FUSION/FORAMINOTOMY N/A 01/16/2018   Procedure: Cervical one-two Posterior instrumentation and fusion;  Surgeon: Tressie Stalker, MD;  Location: [facility] OR;  Service: Neurosurgery;  Laterality: N/A;  . RADIAL HEAD ARTHROPLASTY Right 07/28/2014   Procedure: RIGHT RADIAL HEAD REPLACEMENT;  Surgeon: Dairl Ponder, MD;  Location:  SURGERY CENTER;  Service: Orthopedics;  Laterality: Right;  . RIGHT HEART CATH N/A 03/18/2017   Procedure: Right Heart Cath;  Surgeon: Dolores Patty, MD;  [facility]VASIVE CV LAB;  Service: Cardiovascular;  Laterality: N/A;  . SAVORY DILATION N/A 02/12/2017   Procedure: SAVORY DILATION;  Surgeon: Vida Rigger, MD;  Location: [facility] ENDOSCOPY;  Service: Endoscopy;  Laterality: N/A;  . TUBAL LIGATION     . UPPER GI ENDOSCOPY  07/26/2015   Procedure: UPPER GI ENDOSCOPY;  Surgeon: Luretha Murphy, MD;  Location: [facility] ORS;  Service: General;;  . VAGINAL HYSTERECTOMY  2010   LAVH    SOCIAL HISTORY: Social History   Tobacco Use  . Smoking status: Never Smoker  . Smokeless tobacco: Never Used  Substance Use Topics  . Alcohol use: Yes    Alcohol/week: 0.0 oz    Comment: seldom  . Drug use: No    FAMILY HISTORY: Family History  Problem Relation Age of Onset  . Heart failure Mother        Stent put in 2014 for blockage  . COPD Mother   . Hypertension Mother   . Hyperlipidemia Mother   . Heart disease Mother   . Thyroid disease Mother   . Cancer Mother   . Depression Mother   . Anxiety disorder Mother   . Heart disease Father   . Heart disease Son        Repair ASD  . Neuropathy Neg Hx     ROS: Review of Systems  Constitutional: Negative for weight loss.  Gastrointestinal: Negative for nausea and vomiting.  Musculoskeletal:       Negative for muscle weakness  Psychiatric/Behavioral: Positive for depression. Negative for suicidal ideas. The patient has insomnia.     PHYSICAL EXAM: Blood pressure 109/65, pulse 76, temperature 98.1 F (36.7 C), temperature source Oral, height 5\' 4"  (1.626 m), weight 203 lb (92.1 kg), SpO2 96 %. Body mass index is 34.84 kg/m. Physical Exam  Constitutional: She is oriented to person, place, and time. She appears well-developed and well-nourished.  Cardiovascular: Normal rate.  Pulmonary/Chest: Effort normal.  Musculoskeletal: Normal range of motion.  Neurological: She is oriented to person, place, and time.  Skin: Skin is warm and dry.  Psychiatric: She has a normal mood and affect. Her behavior is normal.  Vitals reviewed.   RECENT LABS AND TESTS: BMET    Component Value Date/Time   NA 141 01/13/2018 1000   NA 143 12/10/2017 0955   K 3.6 01/13/2018 1000   CL 106 01/13/2018 1000   CO2 27 01/13/2018 1000  GLUCOSE 86 01/13/2018  1000   BUN 13 01/13/2018 1000   BUN 14 12/10/2017 0955   CREATININE 0.91 01/13/2018 1000   CREATININE 0.75 03/08/2015 1501   CALCIUM 9.2 01/13/2018 1000   GFRNONAA >60 01/13/2018 1000   GFRNONAA >89 03/08/2015 1501   GFRAA >60 01/13/2018 1000   GFRAA >89 03/08/2015 1501   Lab Results  Component Value Date   HGBA1C 4.9 12/10/2017   HGBA1C 5.0 09/10/2017   Lab Results  Component Value Date   INSULIN 4.4 12/10/2017   INSULIN 6.1 09/10/2017   CBC    Component Value Date/Time   WBC 6.2 01/13/2018 1000   RBC 4.43 01/13/2018 1000   HGB 13.5 01/13/2018 1000   HGB 13.9 09/10/2017 1108   HCT 40.7 01/13/2018 1000   HCT 41.6 09/10/2017 1108   PLT 220 01/13/2018 1000   MCV 91.9 01/13/2018 1000   MCV 90 09/10/2017 1108   MCH 30.5 01/13/2018 1000   MCHC 33.2 01/13/2018 1000   RDW 13.4 01/13/2018 1000   RDW 14.1 09/10/2017 1108   LYMPHSABS 2.3 09/10/2017 1108   MONOABS 0.9 07/28/2015 0455   EOSABS 0.2 09/10/2017 1108   BASOSABS 0.0 09/10/2017 1108   Iron/TIBC/Ferritin/ %Sat No results found for: IRON, TIBC, FERRITIN, IRONPCTSAT Lipid Panel     Component Value Date/Time   CHOL 151 09/10/2017 1108   TRIG 102 09/10/2017 1108   HDL 70 09/10/2017 1108   LDLCALC 61 09/10/2017 1108   Hepatic Function Panel     Component Value Date/Time   PROT 6.3 12/10/2017 0955   ALBUMIN 4.2 12/10/2017 0955   AST 18 12/10/2017 0955   ALT 15 12/10/2017 0955   ALKPHOS 85 12/10/2017 0955   BILITOT 0.5 12/10/2017 0955      Component Value Date/Time   TSH 2.690 09/10/2017 1108     Ref. Range 12/10/2017 09:55  Vitamin D, 25-Hydroxy Latest Ref Range: 30.0 - 100.0 ng/mL 51.3   ASSESSMENT AND PLAN: Vitamin D deficiency - Plan: Vitamin D, Ergocalciferol, (DRISDOL) 50000 units CAPS capsule  Other depression - with emotional eating - Plan: buPROPion (WELLBUTRIN SR) 200 MG 12 hr tablet  At risk for osteoporosis  Class 2 severe obesity with serious comorbidity and body mass index (BMI) of  35.0 to 35.9 in adult, unspecified obesity type (HCC)  PLAN:  Vitamin D Deficiency Ashelynn was informed that low vitamin D levels contributes to fatigue and are associated with obesity, breast, and colon cancer. She agrees to continue to take prescription Vit D @50 ,000 IU every week #4 with no refills and will follow up for routine testing of vitamin D, at least 2-3 times per year. She was informed of the risk of over-replacement of vitamin D and agrees to not increase her dose unless she discusses this with [place] first. Shanterria agrees to follow up with our clinic in 2 weeks.  At risk for osteopenia and osteoporosis Geraldin is at risk for osteopenia and osteoporosis due to her vitamin D deficiency. She was encouraged to take her vitamin D and follow her higher calcium diet and increase strengthening exercise to help strengthen her bones and decrease her risk of osteopenia and osteoporosis.  Obesity Linlee is currently in the action stage of change. As such, her goal is to continue with weight loss efforts She has agreed to get back to keeping a food journal with 1100 to 1300 calories and 75+ grams of protein daily Lorell has been instructed to work up to a goal of 150  minutes of combined cardio and strengthening exercise per week for weight loss and overall health benefits. We discussed the following Behavioral Modification Strategies today: increasing lean protein intake and keep a strict food journal  Mitzie has agreed to follow up with our clinic in 2 weeks. She was informed of the importance of frequent follow up visits to maximize her success with intensive lifestyle modifications for her multiple health conditions.   OBESITY BEHAVIORAL INTERVENTION VISIT  Today's visit was # 9 out of 22.  Starting weight: 207 lbs Starting date: 09/10/17 Today's weight : 203 lbs Today's date: 02/24/2018 Total lbs lost to date: 4 (Patients must lose 7 lbs in the first 6 months to continue with  counseling)   ASK: We discussed the diagnosis of obesity with Burley Saver today and Treniece agreed to give [place] permission to discuss obesity behavioral modification therapy today.  ASSESS: Hoda has the diagnosis of obesity and her BMI today is 34.83 Becca is in the action stage of change   ADVISE: Lacie was educated on the multiple health risks of obesity as well as the benefit of weight loss to improve her health. She was advised of the need for long term treatment and the importance of lifestyle modifications.  AGREE: Multiple dietary modification options and treatment options were discussed and  Infinity agreed to the above obesity treatment plan.  I, [place] Crane, am acting as transcriptionist for Quillian Quince, MD  I have reviewed the above documentation for accuracy and completeness, and I agree with the above. -Quillian Quince, MD

## 2018-02-27 MED ORDER — VITAMIN D (ERGOCALCIFEROL) 1.25 MG (50000 UNIT) PO CAPS: 50000.0000 [IU] | ORAL_CAPSULE | ORAL | 1 refills | Status: DC

## 2018-02-27 MED ORDER — BUPROPION HCL ER (SR) 200 MG PO TB12: 200.0000 mg | ORAL_TABLET | Freq: Every day | ORAL | 0 refills | Status: DC

## 2018-03-10 ENCOUNTER — Ambulatory Visit (INDEPENDENT_AMBULATORY_CARE_PROVIDER_SITE_OTHER): Payer: BLUE CROSS/BLUE SHIELD | Admitting: Family Medicine

## 2018-03-10 VITALS — BP 132/84 | HR 61 | Temp 97.6°F | Ht 64.0 in | Wt 199.0 lb

## 2018-03-10 DIAGNOSIS — Z6834 Body mass index (BMI) 34.0-34.9, adult: Secondary | ICD-10-CM

## 2018-03-10 DIAGNOSIS — E559 Vitamin D deficiency, unspecified: Secondary | ICD-10-CM

## 2018-03-10 DIAGNOSIS — E669 Obesity, unspecified: Secondary | ICD-10-CM | POA: Diagnosis not present

## 2018-03-11 NOTE — Progress Notes (Signed)
Office: [phone]  /  Fax: [phone]   HPI:   Chief Complaint: OBESITY Rachael Camacho is here to discuss her progress with her obesity treatment plan. She is on the keep a food journal with 1100 to 1300 calories and 75+ grams of protein daily and is following her eating plan approximately 50 % of the time. She states she is exercising 0 minutes 0 times per week. Rachael Camacho continues to do well with weight loss. She is journaling on and off and is mostly trying to control her portions and make smarter food choices. She is ready to start exercising and likes to exercise at the gym for the social aspects. Her weight is 199 lb (90.3 kg) today and has had a weight loss of 4 pounds over a period of 2 weeks since her last visit. She has lost 8 lbs since starting treatment with [place].  Vitamin D deficiency Rachael Camacho has a diagnosis of vitamin D deficiency. She is currently taking prescription vit D and last level was not at goal. Rachael Camacho denies nausea, vomiting or muscle weakness.  ALLERGIES: Allergies  Allergen Reactions   Amoxicillin Other (See Comments)    Immediate yeast infection  Has patient had a PCN reaction causing immediate rash, facial/tongue/throat swelling, SOB or lightheadedness with hypotension: No Has patient had a PCN reaction causing severe rash involving mucus membranes or skin necrosis: No Has patient had a PCN reaction that required hospitalization: No Has patient had a PCN reaction occurring within the last 10 years: No If all of the above answers are "NO", then may proceed with Cephalosporin use.    Percocet [Oxycodone-Acetaminophen] Itching    Tolerates acetaminophen alone   [name] [Nitrofurantoin Monohyd Macro] Rash and Hives   [name] Hives and Rash    Reported chicken pox-like rash, not hives   Sulfa Antibiotics Rash and Hives    MEDICATIONS: Current Outpatient Medications on File Prior to Visit  Medication Sig Dispense Refill   baclofen (LIORESAL) 10 MG  tablet Take 1 tablet (10 mg total) by mouth 3 (three) times daily. (Patient taking differently: Take 10 mg by mouth at bedtime. ) 90 each 11   Biotin 5000 MCG CAPS Take 5,000 mcg by mouth daily.     buPROPion (WELLBUTRIN SR) 200 MG 12 hr tablet Take 1 tablet (200 mg total) by mouth daily. 30 tablet 0   Cyanocobalamin (VITAMIN B-12) 5000 MCG SUBL Take 5,000 mcg by mouth daily.     desloratadine (CLARINEX) 5 MG tablet Take 5 mg by mouth at bedtime.   12   diclofenac sodium (VOLTAREN) 1 % GEL Apply 2-4 g topically 4 (four) times daily as needed (for knee pain.).     fluticasone (FLONASE) 50 MCG/ACT nasal spray Place 1 spray into both nostrils at bedtime. Aller-Flo     furosemide (LASIX) 20 MG tablet TAKE 1 TABLET (20 MG TOTAL) BY MOUTH DAILY. 90 tablet 3   gabapentin (NEURONTIN) 100 MG capsule Take 1 capsule (100 mg total) by mouth 3 (three) times daily. (Patient taking differently: Take 100 mg by mouth See admin instructions. Take 1 capsule (100 mg) in the morning, & 1 capsule (100 mg) at lunch if needed for pain.) 90 capsule 6   levothyroxine (SYNTHROID, LEVOTHROID) 50 MCG tablet Take 25 mcg by mouth daily before breakfast.   6   Macitentan (OPSUMIT) 10 MG TABS Take 1 tablet (10 mg total) by mouth daily. 90 tablet 3   Multiple Vitamin (MULTIVITAMIN WITH MINERALS) TABS tablet Take 1 tablet by  mouth daily.     Probiotic Product (PROBIOTIC PO) Take 1 capsule by mouth daily.     Vitamin D, Ergocalciferol, (DRISDOL) 50000 units CAPS capsule Take 1 capsule (50,000 Units total) by mouth every 7 (seven) days. 4 capsule 1   No current facility-administered medications on file prior to visit.     PAST MEDICAL HISTORY: Past Medical History:  Diagnosis Date   Anemia    as child in first grade   Anxiety    Arthritis    Back pain    Constipation    CREST syndrome (HCC)    Dyspnea    Family history of adverse reaction to anesthesia    mom has had problems in past - N/V post op on  one occasion; difficult time waking up on another occasion   Gallbladder problem    GERD (gastroesophageal reflux disease)    Gestational diabetes mellitus in childbirth, diet controlled 1987   IBS (irritable bowel syndrome)    Joint pain    Leg edema    Menopause    Osteoarthritis (arthritis due to wear and tear of joints)    PAH (pulmonary artery hypertension) (HCC)    Pulmonary fibrosis (HCC)    Raynaud disease    hands-toes   Scleroderma (HCC)    Shortness of breath dyspnea    pulmonary fibrosis-scleraderma   Sleep apnea    Swallowing difficulty    Thyroid cyst    Vitamin D deficiency    Wears glasses     PAST SURGICAL HISTORY: Past Surgical History:  Procedure Laterality Date   CARDIAC CATHETERIZATION N/A 11/25/2015   Procedure: Right Heart Cath;  Surgeon: Dolores Patty, MD;  Location: [facility] INVASIVE CV LAB;  Service: Cardiovascular;  Laterality: N/A;   CESAREAN SECTION  87,95   CHOLECYSTECTOMY  1996   lap choli   COLONOSCOPY     ESOPHAGOGASTRODUODENOSCOPY (EGD) WITH PROPOFOL N/A 02/12/2017   Procedure: ESOPHAGOGASTRODUODENOSCOPY (EGD) WITH PROPOFOL;  Surgeon: Vida Rigger, MD;  Location: [facility] ENDOSCOPY;  Service: Endoscopy;  Laterality: N/A;   HIATAL HERNIA REPAIR  07/26/2015   Procedure: LAPAROSCOPIC REPAIR OF HIATAL HERNIA;  Surgeon: Luretha Murphy, MD;  Location: [facility] ORS;  Service: General;;   LAPAROSCOPIC GASTRIC SLEEVE RESECTION N/A 07/26/2015   Procedure: LAPAROSCOPIC GASTRIC SLEEVE RESECTION;  Surgeon: Luretha Murphy, MD;  Location: [facility] ORS;  Service: General;  Laterality: N/A;   LEFT AND RIGHT HEART CATHETERIZATION WITH CORONARY ANGIOGRAM N/A 03/14/2015   Procedure: LEFT AND RIGHT HEART CATHETERIZATION WITH CORONARY ANGIOGRAM;  Surgeon: Peter M Swaziland, MD;  [facility] CATH LAB;  Service: Cardiovascular;  Laterality: N/A;   POSTERIOR CERVICAL FUSION/FORAMINOTOMY N/A 01/16/2018   Procedure: Cervical one-two Posterior instrumentation and fusion;   Surgeon: Tressie Stalker, MD;  Location: [facility] OR;  Service: Neurosurgery;  Laterality: N/A;   RADIAL HEAD ARTHROPLASTY Right 07/28/2014   Procedure: RIGHT RADIAL HEAD REPLACEMENT;  Surgeon: Dairl Ponder, MD;  Location: [facility];  Service: Orthopedics;  Laterality: Right;   RIGHT HEART CATH N/A 03/18/2017   Procedure: Right Heart Cath;  Surgeon: Dolores Patty, MD;  Location: [facility] INVASIVE CV LAB;  Service: Cardiovascular;  Laterality: N/A;   SAVORY DILATION N/A 02/12/2017   Procedure: SAVORY DILATION;  Surgeon: Vida Rigger, MD;  Location: [facility] ENDOS[facility];  Service: Endoscopy;  Laterality: N/A;   TUBAL LIGATION     UPPER GI ENDOSCOPY  07/26/2015   Procedure: UPPER GI ENDOSCOPY;  Surgeon: Luretha Murphy, MD;  Location: [facility] ORS;  Service: General;;  VAGINAL HYSTERECTOMY  2010   LAVH    SOCIAL HISTORY: Social History   Tobacco Use   Smoking status: Never Smoker   Smokeless tobacco: Never Used  Substance Use Topics   Alcohol use: Yes    Alcohol/week: 0.0 oz    Comment: seldom   Drug use: No    FAMILY HISTORY: Family History  Problem Relation Age of Onset   Heart failure Mother        Stent put in 2014 for blockage   COPD Mother    Hypertension Mother    Hyperlipidemia Mother    Heart disease Mother    Thyroid disease Mother    Cancer Mother    Depression Mother    Anxiety disorder Mother    Heart disease Father    Heart disease Son        Repair ASD   Neuropathy Neg Hx     ROS: Review of Systems  Constitutional: Positive for weight loss.  Gastrointestinal: Negative for nausea and vomiting.  Musculoskeletal:       Negative for muscle weakness    PHYSICAL EXAM: Blood pressure 132/84, pulse 61, temperature 97.6 F (36.4 C), temperature source Oral, height 5\' 4"  (1.626 m), weight 199 lb (90.3 kg), SpO2 (!) 75 %. Body mass index is 34.16 kg/m. Physical Exam  Constitutional: She is oriented to person, place, and time. She appears  well-developed and well-nourished.  Cardiovascular: Normal rate.  Pulmonary/Chest: Effort normal.  Musculoskeletal: Normal range of motion.  Neurological: She is oriented to person, place, and time.  Skin: Skin is warm and dry.  Psychiatric: She has a normal mood and affect. Her behavior is normal.  Vitals reviewed.   RECENT LABS AND TESTS: BMET    Component Value Date/Time   NA 141 01/13/2018 1000   NA 143 12/10/2017 0955   K 3.6 01/13/2018 1000   CL 106 01/13/2018 1000   CO2 27 01/13/2018 1000   GLUCOSE 86 01/13/2018 1000   BUN 13 01/13/2018 1000   BUN 14 12/10/2017 0955   CREATININE 0.91 01/13/2018 1000   CREATININE 0.75 03/08/2015 1501   CALCIUM 9.2 01/13/2018 1000   GFRNONAA >60 01/13/2018 1000   GFRNONAA >89 03/08/2015 1501   GFRAA >60 01/13/2018 1000   GFRAA >89 03/08/2015 1501   Lab Results  Component Value Date   HGBA1C 4.9 12/10/2017   HGBA1C 5.0 09/10/2017   Lab Results  Component Value Date   INSULIN 4.4 12/10/2017   INSULIN 6.1 09/10/2017   CBC    Component Value Date/Time   WBC 6.2 01/13/2018 1000   RBC 4.43 01/13/2018 1000   HGB 13.5 01/13/2018 1000   HGB 13.9 09/10/2017 1108   HCT 40.7 01/13/2018 1000   HCT 41.6 09/10/2017 1108   PLT 220 01/13/2018 1000   MCV 91.9 01/13/2018 1000   MCV 90 09/10/2017 1108   MCH 30.5 01/13/2018 1000   MCHC 33.2 01/13/2018 1000   RDW 13.4 01/13/2018 1000   RDW 14.1 09/10/2017 1108   LYMPHSABS 2.3 09/10/2017 1108   MONOABS 0.9 07/28/2015 0455   EOSABS 0.2 09/10/2017 1108   BASOSABS 0.0 09/10/2017 1108   Iron/TIBC/Ferritin/ %Sat No results found for: IRON, TIBC, FERRITIN, IRONPCTSAT Lipid Panel     Component Value Date/Time   CHOL 151 09/10/2017 1108   TRIG 102 09/10/2017 1108   HDL 70 09/10/2017 1108   LDLCALC 61 09/10/2017 1108   Hepatic Function Panel     Component Value Date/Time   PROT  6.3 12/10/2017 0955   ALBUMIN 4.2 12/10/2017 0955   AST 18 12/10/2017 0955   ALT 15 12/10/2017 0955    ALKPHOS 85 12/10/2017 0955   BILITOT 0.5 12/10/2017 0955      Component Value Date/Time   TSH 2.690 09/10/2017 1108   Results for DUHA, ABAIR (MRN [id number]) as of 03/11/2018 08:02  Ref. Range 12/10/2017 09:55  Vitamin D, 25-Hydroxy Latest Ref Range: 30.0 - 100.0 ng/mL 51.3   ASSESSMENT AND PLAN: Vitamin D deficiency  Class 1 obesity with serious comorbidity and body mass index (BMI) of 34.0 to 34.9 in adult, unspecified obesity type  PLAN:  Vitamin D Deficiency Rachael Camacho was informed that low vitamin D levels contributes to fatigue and are associated with obesity, breast, and colon cancer. She agrees to continue to take prescription Vit D @50 ,000 IU every week. We will recheck labs at the next visit and she will follow up for routine testing of vitamin D, at least 2-3 times per year. She was informed of the risk of over-replacement of vitamin D and agrees to not increase her dose unless she discusses this with [place] first.  We spent > than 50% of the 15 minute visit on the counseling as documented in the note.  Obesity Rachael Camacho is currently in the action stage of change. As such, her goal is to continue with weight loss efforts She has agreed to portion control better and make smarter food choices, such as increase vegetables and decrease simple carbohydrates  Rachael Camacho has been instructed to work up to a goal of 150 minutes of combined cardio and strengthening exercise per week for weight loss and overall health benefits. We discussed the following Behavioral Modification Strategies today: increasing lean protein intake and work on meal planning and easy cooking plans  Rachael Camacho has agreed to follow up with our clinic in 2 to 3 weeks. She was informed of the importance of frequent follow up visits to maximize her success with intensive lifestyle modifications for her multiple health conditions.   OBESITY BEHAVIORAL INTERVENTION VISIT  Today's visit was # 10 out of 22.  Starting weight:  207 lbs Starting date: 09/10/17 Today's weight : 199 lbs Today's date: 03/10/2018 Total lbs lost to date: 8 (Patients must lose 7 lbs in the first 6 months to continue with counseling)   ASK: We discussed the diagnosis of obesity with Burley Saver today and Patrecia agreed to give [place] permission to discuss obesity behavioral modification therapy today.  ASSESS: Jisselle has the diagnosis of obesity and her BMI today is 34.14 Zaryia is in the action stage of change   ADVISE: Kayler was educated on the multiple health risks of obesity as well as the benefit of weight loss to improve her health. She was advised of the need for long term treatment and the importance of lifestyle modifications.  AGREE: Multiple dietary modification options and treatment options were discussed and  Shavontae agreed to the above obesity treatment plan.  I, [place] Crane, am acting as transcriptionist for Quillian Quince, MD  I have reviewed the above documentation for accuracy and completeness, and I agree with the above. -Quillian Quince, MD

## 2018-03-13 ENCOUNTER — Encounter (INDEPENDENT_AMBULATORY_CARE_PROVIDER_SITE_OTHER): Payer: Self-pay | Admitting: Family Medicine

## 2018-03-17 ENCOUNTER — Encounter: Payer: Self-pay | Admitting: Neurology

## 2018-03-17 ENCOUNTER — Ambulatory Visit: Payer: BLUE CROSS/BLUE SHIELD | Admitting: Neurology

## 2018-03-17 VITALS — BP 106/75 | HR 77 | Ht 64.0 in | Wt 205.0 lb

## 2018-03-17 DIAGNOSIS — G500 Trigeminal neuralgia: Secondary | ICD-10-CM

## 2018-03-17 NOTE — Progress Notes (Signed)
[facility]  Provider:  Dr Lucia Gaskins Referring Provider: Quillian Quince, MD Primary Care Physician:  Daisy Floro, MD  CC:  Headache  Interval history 03/17/2018: Patient her for follow up of trigeminal neuralgia. 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 She feels better, she has improved with the trigeminal pain, she has occasional neuralgia but nothing like before. She continues to have right hand numbness and tingling, she is getting an emg/ncs. Started after surgery this is new and continues. She will be going to PT in a few months after healing. Discussed surgery, physical therapy in the future. Discussed any further treatment of trigeminal pain, she takes gabapentin and baclofen. Taking them more often made her "loopy". She increased her Gabaoentin to 300mg  which did not help with the tingling at all. Overall improved. Discussed Trigeminal neuralgia was likely due to the c1-c2 lesion as this is where the trigeminal nucleus resides.   HPI:  Rachael Camacho is a 52 y.o. female here as a referral from Dr. Tenny Craw for headache. PMHx scleroderma, CREST, Raynaud, pulmonary fibrosis, PAH.  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 Last February she yawned and it popped and she went to bed and had a horrible headache, they didn't see anything on exam, walk-in clinic saw nothing, she saw ENT. She saw Dr. Dorma Russell who discovered she had arthritis, she tried to see a TMJ expert, she saw an oral surgeon and he couldn't do anything. When she yawns it hurts. She has shooting, intense pain or just an ache in the middle of the night. She has a mouth guard but this worsens the pain. Triggers include sudden movements. Even rubbing that side makes it worse. Movements make it worse. Brief. No autonomic symptoms, happens 20x a day.  No weakness of the face or any other symptoms. No other focal  neurologic deficits, associated symptoms, inciting events or modifiable factors.  Reviewed notes, labs and imaging from outside physicians, which showed:  CMP/CBC normal  CT Temporal bone 04/2017:  FINDINGS: Right ear: The external auditory canal is normal.The mastoids are well aerated with no fluid or opacification.The middle ear cavity is normal with no fluid or soft tissue mass.The ossicles are intact without disruption or erosion.The inner ear  structures are normal with no congenital malformation or bony demineralization.The carotid canal and jugular foramen regions are normal.  Left ear: The external auditory canal is normal.The mastoids are well aerated with no fluid or opacification.The middle ear cavity is normal with no fluid or soft tissue mass.The ossicles are intact without disruption or erosion.The inner ear  structures are normal with no congenital malformation or bony demineralization.The carotid canal and jugular foramen regions are normal.  #Paranasal sinuses: Clear #Nasopharynx: Normal #Intracranial structures: No abnormality seen. #Temporomandibular joints: There is mild irregularity of the articular surface of the right mandibular condyle with a small degenerative cyst in the mandibular condyle. Consistent with mild osteoarthritis. The left temporomandibular joint is normal. #Additional findings: There are elongated styloid processes and calcified stylohyoid ligaments bilaterally. The right styloid process/mylohyoid ligament measures 3.1 cm. The left styloid process/calcified stylohyoid ligament measures at least 3.7 cm  but is incompletely imaged. These findings can be seen with Eagle's syndrome which can cause ear pain.  Review of Systems: Patient complains of symptoms per HPI as well as the following symptoms: blurred vision, difficulty swallowing, dizziness, feeling cold, SOB, swelling in legs,  moles. Pertinent negatives and positives per  HPI. All others negative.   Social History   Socioeconomic History  . Marital status: Married    Spouse name: [name]  . Number of children: 2  . Years of education: Not on file  . Highest education level: Not on file  Occupational History  . Occupation: Facilities manager    Comment: Office Work  Social Needs  . Financial resource strain: Not on file  . Food insecurity:    Worry: Not on file    Inability: Not on file  . Transportation needs:    Medical: Not on file    Non-medical: Not on file  Tobacco Use  . Smoking status: Never Smoker  . Smokeless tobacco: Never Used  Substance and Sexual Activity  . Alcohol use: Yes    Alcohol/week: 0.0 oz    Comment: seldom  . Drug use: No  . Sexual activity: Not on file  Lifestyle  . Physical activity:    Days per week: Not on file    Minutes per session: Not on file  . Stress: Not on file  Relationships  . Social connections:    Talks on phone: Not on file    Gets together: Not on file    Attends religious service: Not on file    Active member of club or organization: Not on file    Attends meetings of clubs or organizations: Not on file    Relationship status: Not on file  . Intimate partner violence:    Fear of current or ex partner: Not on file    Emotionally abused: Not on file    Physically abused: Not on file    Forced sexual activity: Not on file  Other Topics Concern  . Not on file  Social History Narrative   Lives at home with her husband   Right handed    Family History  Problem Relation Age of Onset  . Heart failure Mother        Stent put in 2014 for blockage  . COPD Mother   . Hypertension Mother   . Hyperlipidemia Mother   . Heart disease Mother   . Thyroid disease Mother   . Cancer Mother   . Depression Mother   . Anxiety disorder Mother   . Heart disease Father   . Heart disease Son        Repair ASD  . Neuropathy Neg Hx     Past Medical History:  Diagnosis Date    . Anemia    as child in first grade  . Anxiety   . Arthritis   . Back pain   . Constipation   . CREST syndrome (HCC)   . Dyspnea   . Family history of adverse reaction to anesthesia    mom has had problems in past - N/V post op on one occasion; difficult time waking up on another occasion  . Gallbladder problem   . GERD (gastroesophageal reflux disease)   . Gestational diabetes mellitus in childbirth, diet controlled 1987  . IBS (irritable bowel syndrome)   . Joint pain   . Leg edema   . Menopause   . Osteoarthritis (arthritis due to wear and tear of joints)   . PAH (pulmonary artery hypertension) (HCC)   . Pulmonary fibrosis (HCC)   . Raynaud disease    hands-toes  . Scleroderma (HCC)   . Shortness of breath dyspnea    pulmonary fibrosis-scleraderma  .  Sleep apnea   . Swallowing difficulty   . Thyroid cyst   . Vitamin D deficiency   . Wears glasses     Past Surgical History:  Procedure Laterality Date  . CARDIAC CATHETERIZATION N/A 11/25/2015   Procedure: Right Heart Cath;  Surgeon: Dolores Patty, MD;  Location: [facility] INVASIVE CV LAB;  Service: Cardiovascular;  Laterality: N/A;  . CESAREAN SECTION  87,95  . CHOLECYSTECTOMY  1996   lap choli  . COLONOSCOPY    . ESOPHAGOGASTRODUODENOSCOPY (EGD) WITH PROPOFOL N/A 02/12/2017   Procedure: ESOPHAGOGASTRODUODENOSCOPY (EGD) WITH PROPOFOL;  Surgeon: Vida Rigger, MD;  Location: [facility] ENDOSCOPY;  Service: Endoscopy;  Laterality: N/A;  . HIATAL HERNIA REPAIR  07/26/2015   Procedure: LAPAROSCOPIC REPAIR OF HIATAL HERNIA;  Surgeon: Luretha Murphy, MD;  Location: [facility] ORS;  Service: General;;  . LAPAROSCOPIC GASTRIC SLEEVE RESECTION N/A 07/26/2015   Procedure: LAPAROSCOPIC GASTRIC SLEEVE RESECTION;  Surgeon: Luretha Murphy, MD;  Location: [facility] ORS;  Service: General;  Laterality: N/A;  . LEFT AND RIGHT HEART CATHETERIZATION WITH CORONARY ANGIOGRAM N/A 03/14/2015   Procedure: LEFT AND RIGHT HEART CATHETERIZATION WITH CORONARY ANGIOGRAM;   Surgeon: Peter M Swaziland, MD;  Location: [facility] CATH LAB;  Service: Cardiovascular;  Laterality: N/A;  . POSTERIOR CERVICAL FUSION/FORAMINOTOMY N/A 01/16/2018   Procedure: Cervical one-two Posterior instrumentation and fusion;  Surgeon: Tressie Stalker, MD;  Location: [facility] OR;  Service: Neurosurgery;  Laterality: N/A;  . RADIAL HEAD ARTHROPLASTY Right 07/28/2014   Procedure: RIGHT RADIAL HEAD REPLACEMENT;  Surgeon: Dairl Ponder, MD;  Location: [facility];  Service: Orthopedics;  Laterality: Right;  . RIGHT HEART CATH N/A 03/18/2017   Procedure: Right Heart Cath;  Surgeon: Dolores Patty, MD;  Location: [facility] INVASIVE CV LAB;  Service: Cardiovascular;  Laterality: N/A;  . SAVORY DILATION N/A 02/12/2017   Procedure: SAVORY DILATION;  Surgeon: Vida Rigger, MD;  Location: [facility] ENDOSCOPY;  Service: Endoscopy;  Laterality: N/A;  . TUBAL LIGATION    . UPPER GI ENDOSCOPY  07/26/2015   Procedure: UPPER GI ENDOSCOPY;  Surgeon: Luretha Murphy, MD;  Location: [facility] ORS;  Service: General;;  . VAGINAL HYSTERECTOMY  2010   [facility]    Current Outpatient Medications  Medication Sig Dispense Refill  . baclofen (LIORESAL) 10 MG tablet Take 1 tablet (10 mg total) by mouth 3 (three) times daily. (Patient taking differently: Take 10 mg by mouth at bedtime. ) 90 each 11  . Biotin 5000 MCG CAPS Take 5,000 mcg by mouth daily.    [name] buPROPion (WELLBUTRIN SR) 200 MG 12 hr tablet Take 1 tablet (200 mg total) by mouth daily. 30 tablet 0  . Cyanocobalamin (VITAMIN B-12) 5000 MCG SUBL Take 5,000 mcg by mouth daily.    [name] desloratadine (CLARINEX) 5 MG tablet Take 5 mg by mouth at bedtime.   12  . diclofenac sodium (VOLTAREN) 1 % GEL Apply 2-4 g topically 4 (four) times daily as needed (for knee pain.).    [name] fluticasone (FLONASE) 50 MCG/ACT nasal spray Place 1 spray into both nostrils at bedtime. Aller-Flo    . furosemide (LASIX) 20 MG tablet TAKE 1 TABLET (20 MG TOTAL) BY MOUTH DAILY. 90 tablet 3  . gabapentin (NEURONTIN)  100 MG capsule Take 1 capsule (100 mg total) by mouth 3 (three) times daily. (Patient taking differently: Take 100 mg by mouth at bedtime. ) 90 capsule 6  . levothyroxine (SYNTHROID, LEVOTHROID) 50 MCG tablet Take 25 mcg by mouth daily before breakfast.   6  .  Macitentan (OPSUMIT) 10 MG TABS Take 1 tablet (10 mg total) by mouth daily. 90 tablet 3  . Multiple Vitamin (MULTIVITAMIN WITH MINERALS) TABS tablet Take 1 tablet by mouth daily.    . Probiotic Product (PROBIOTIC PO) Take 1 capsule by mouth daily.    . Vitamin D, Ergocalciferol, (DRISDOL) 50000 units CAPS capsule Take 1 capsule (50,000 Units total) by mouth every 7 (seven) days. 4 capsule 1   No current facility-administered medications for this visit.     Allergies as of 03/17/2018 - Review Complete 03/17/2018  Allergen Reaction Noted  . Amoxicillin Other (See Comments) 02/06/2017  . Percocet [oxycodone-acetaminophen] Itching 07/27/2014  . [name] [nitrofurantoin monohyd macro] Rash and Hives 07/25/2014  . [name] Hives and Rash 07/25/2014  . Sulfa antibiotics Rash and Hives 07/25/2014    Vitals: BP 106/75 (BP Location: Right Arm, Patient Position: Sitting)   Pulse 77   Ht 5\' 4"  (1.626 m)   Wt 205 lb (93 kg)   BMI 35.19 kg/m  Last Weight:  Wt Readings from Last 1 Encounters:  03/17/18 205 lb (93 kg)   Last Height:   Ht Readings from Last 1 Encounters:  03/17/18 5\' 4"  (1.626 m)   Physical exam: Exam: Gen: NAD, conversant, well nourised, obese, well groomed                     CV: RRR, no MRG. No Carotid Bruits. No peripheral edema, warm, nontender Eyes: Conjunctivae clear without exudates or hemorrhage  Neuro: Detailed Neurologic Exam  Speech:    Speech is normal; fluent and spontaneous with normal comprehension.  Cognition:    The patient is oriented to person, place, and time;     recent and remote memory intact;     language fluent;     normal attention, concentration,     fund of knowledge Cranial  Nerves:    The pupils are equal, round, and reactive to light. The fundi are normal and spontaneous venous pulsations are present. Visual fields are full to finger confrontation. Extraocular movements are intact. Trigeminal sensation is intact and the muscles of mastication are normal. The face is symmetric. The palate elevates in the midline. Hearing intact. Voice is normal. Shoulder shrug is normal. The tongue has normal motion without fasciculations.   Coordination:    Normal finger to nose and heel to shin. Normal rapid alternating movements.   Gait:    Heel-toe and tandem gait are normal.   Motor Observation:    No asymmetry, no atrophy, and no involuntary movements noted. Tone:    Normal muscle tone.    Posture:    Posture is normal. normal erect    Strength:    Strength is V/V in the upper and lower limbs.      Sensation: intact to LT     Reflex Exam:  DTR's:    Deep tendon reflexes in the upper and lower extremities are normal bilaterally.   Toes:    The toes are downgoing bilaterally.   Clonus:    Clonus is absent.       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 Discussed Trigeminal neuralgia was likely due to the c1-c2 lesion as this is where the trigeminal nucleus resides.  For her new paresthesias in the right hand, she has an emg/ncs scheduled. Continue the gabapentin and baclofen  prn. Cervical complications are seen in Rheumatoid Arthritis, given patient's history of autoimmune disease will check RF and CCP antibodies.  Orders Placed This Encounter  Procedures  . Rheumatoid factor  . CYCLIC CITRUL PEPTIDE ANTIBODY, IGG/IGA    Cc: Dr. Dalbert GarnetBeasley, Dr. Tenny Crawoss, Dr. Philemon KingdomJenkins  Antonia Ahern, MD  [facility] [address] [place], [place]  [id number]  Phone [phone] Fax [phone]  A total of 25 minutes was spent face-to-face with this patient. Over half this time was spent on counseling patient on the trigeminal neuralgia, atlanto-axial instability and myelomalacia diagnosis and different diagnostic and therapeutic options, counseling and coordination of care, risks ans benefits of management, compliance, or risk factor reduction and education.

## 2018-03-19 ENCOUNTER — Telehealth: Payer: Self-pay | Admitting: *Deleted

## 2018-03-19 LAB — CYCLIC CITRUL PEPTIDE ANTIBODY, IGG/IGA: Cyclic Citrullin Peptide Ab: 6 units (ref 0–19)

## 2018-03-19 LAB — RHEUMATOID FACTOR: Rhuematoid fact SerPl-aCnc: 20.8 IU/mL — ABNORMAL HIGH (ref 0.0–13.9)

## 2018-03-19 NOTE — Telephone Encounter (Signed)
Patient returned call and discussed lab results. She verbalized understanding that Rheumatoid Factor is elevated but that the CCP (confirmatory test) is normal which likely means that she does not have RA. Dr. Lucia GaskinsAhern would like for pt to take these results (available on mychart) to her next rheumatology appointment. The patient stated that she will make an appt to see her rheumatologist. She verbalized appreciation.

## 2018-03-19 NOTE — Telephone Encounter (Addendum)
Called pt & LVM asking for call back. Left office number in message.   ----- Message from Anson FretAntonia B Ahern, MD sent at 03/19/2018 10:23 AM EDT ----- Rheumatoid factor is elevated however her CCP (confirmatory test) is normal which likely means she does not have RA. I would bring the labs to her next rheumatology appointment, she can get them from my chart thanks

## 2018-03-30 ENCOUNTER — Other Ambulatory Visit (INDEPENDENT_AMBULATORY_CARE_PROVIDER_SITE_OTHER): Payer: Self-pay | Admitting: Family Medicine

## 2018-03-30 DIAGNOSIS — F3289 Other specified depressive episodes: Secondary | ICD-10-CM

## 2018-04-01 ENCOUNTER — Ambulatory Visit (INDEPENDENT_AMBULATORY_CARE_PROVIDER_SITE_OTHER): Payer: BLUE CROSS/BLUE SHIELD | Admitting: Family Medicine

## 2018-04-01 VITALS — BP 105/71 | HR 100 | Temp 97.8°F | Ht 64.0 in | Wt 201.0 lb

## 2018-04-01 DIAGNOSIS — E559 Vitamin D deficiency, unspecified: Secondary | ICD-10-CM

## 2018-04-01 DIAGNOSIS — Z6834 Body mass index (BMI) 34.0-34.9, adult: Secondary | ICD-10-CM

## 2018-04-01 DIAGNOSIS — E669 Obesity, unspecified: Secondary | ICD-10-CM | POA: Diagnosis not present

## 2018-04-01 DIAGNOSIS — F3289 Other specified depressive episodes: Secondary | ICD-10-CM | POA: Diagnosis not present

## 2018-04-01 MED ORDER — VITAMIN D (ERGOCALCIFEROL) 1.25 MG (50000 UNIT) PO CAPS: 50000.0000 [IU] | ORAL_CAPSULE | ORAL | 1 refills | Status: DC

## 2018-04-01 MED ORDER — BUPROPION HCL ER (SR) 200 MG PO TB12: 200.0000 mg | ORAL_TABLET | Freq: Every day | ORAL | 0 refills | Status: DC

## 2018-04-02 NOTE — Progress Notes (Signed)
Office: [phone]  /  Fax: [phone]   HPI:   Chief Complaint: OBESITY Rachael Camacho is here to discuss her progress with her obesity treatment plan. She is on the portion control better and make smarter food choices, such as increase vegetables and decrease simple carbohydrates  and is following her eating plan approximately 75-80 % of the time. She states she is exercising 0 minutes 0 times per week. Rachael Camacho is off track recently with increased work stress and not planning meals ahead as often. She is getting ready to travel which will make weight loss more difficult.  Her weight is 201 lb (91.2 kg) today and has gained 2 pounds since her last visit. She has lost 6 lbs since starting treatment with [place].  Vitamin D Deficiency Rachael Camacho has a diagnosis of vitamin D deficiency. She is stable on prescription Vit D, not yet at goal. She denies nausea, vomiting or muscle weakness.  Depression with emotional eating behaviors Rachael Camacho's mood is stable on Wellbutrin, struggling with increased emotional eating but this is due to increased stress at work recently. Otherwise, she feels she is doing better. Rachael Camacho struggles with emotional eating and using food for comfort to the extent that it is negatively impacting her health. She often snacks when she is not hungry. Rachael Camacho sometimes feels she is out of control and then feels guilty that she made poor food choices. She has been working on behavior modification techniques to help reduce her emotional eating and has been somewhat successful. She shows no sign of suicidal or homicidal ideations.  Depression screen [facility] 2/9 09/10/2017 10/18/2016 07/18/2016 05/02/2016 03/01/2016  Decreased Interest 2 0 0 0 0  Down, Depressed, Hopeless 2 0 0 0 0  PHQ - 2 Score 4 0 0 0 0  Altered sleeping 1 - - - -  Tired, decreased energy 3 - - - -  Change in appetite 1 - - - -  Feeling bad or failure about yourself  3 - - - -  Trouble concentrating 2 - - - -  Moving slowly or  fidgety/restless 2 - - - -  Suicidal thoughts 0 - - - -  PHQ-9 Score 16 - - - -  Difficult doing work/chores Somewhat difficult - - - -   ALLERGIES: Allergies  Allergen Reactions  . Amoxicillin Other (See Comments)    Immediate yeast infection  Has patient had a PCN reaction causing immediate rash, facial/tongue/throat swelling, SOB or lightheadedness with hypotension: No Has patient had a PCN reaction causing severe rash involving mucus membranes or skin necrosis: No Has patient had a PCN reaction that required hospitalization: No Has patient had a PCN reaction occurring within the last 10 years: No If all of the above answers are "NO", then may proceed with Cephalosporin use.   [name] Percocet [Oxycodone-Acetaminophen] Itching    Tolerates acetaminophen alone  . [name] [Nitrofurantoin Monohyd Macro] Rash and Hives  . [name] Hives and Rash    Reported chicken pox-like rash, not hives  . Sulfa Antibiotics Rash and Hives    MEDICATIONS: Current Outpatient Medications on File Prior to Visit  Medication Sig Dispense Refill  . baclofen (LIORESAL) 10 MG tablet Take 1 tablet (10 mg total) by mouth 3 (three) times daily. (Patient taking differently: Take 10 mg by mouth at bedtime. ) 90 each 11  . Biotin 5000 MCG CAPS Take 5,000 mcg by mouth daily.    . Cyanocobalamin (VITAMIN B-12) 5000 MCG SUBL Take 5,000 mcg by mouth daily.    [name]  desloratadine (CLARINEX) 5 MG tablet Take 5 mg by mouth at bedtime.   12  . diclofenac sodium (VOLTAREN) 1 % GEL Apply 2-4 g topically 4 (four) times daily as needed (for knee pain.).    [name] fluticasone (FLONASE) 50 MCG/ACT nasal spray Place 1 spray into both nostrils at bedtime. Aller-Flo    . furosemide (LASIX) 20 MG tablet TAKE 1 TABLET (20 MG TOTAL) BY MOUTH DAILY. 90 tablet 3  . gabapentin (NEURONTIN) 100 MG capsule Take 1 capsule (100 mg total) by mouth 3 (three) times daily. (Patient taking differently: Take 100 mg by mouth at bedtime. ) 90 capsule 6  .  levothyroxine (SYNTHROID, LEVOTHROID) 50 MCG tablet Take 25 mcg by mouth daily before breakfast.   6  . Macitentan (OPSUMIT) 10 MG TABS Take 1 tablet (10 mg total) by mouth daily. 90 tablet 3  . Multiple Vitamin (MULTIVITAMIN WITH MINERALS) TABS tablet Take 1 tablet by mouth daily.    . Probiotic Product (PROBIOTIC PO) Take 1 capsule by mouth daily.     No current facility-administered medications on file prior to visit.     PAST MEDICAL HISTORY: Past Medical History:  Diagnosis Date  . Anemia    as child in first grade  . Anxiety   . Arthritis   . Back pain   . Constipation   . CREST syndrome (HCC)   . Dyspnea   . Family history of adverse reaction to anesthesia    mom has had problems in past - N/V post op on one occasion; difficult time waking up on another occasion  . Gallbladder problem   . GERD (gastroesophageal reflux disease)   . Gestational diabetes mellitus in childbirth, diet controlled 1987  . IBS (irritable bowel syndrome)   . Joint pain   . Leg edema   . Menopause   . Osteoarthritis (arthritis due to wear and tear of joints)   . PAH (pulmonary artery hypertension) (HCC)   . Pulmonary fibrosis (HCC)   . Raynaud disease    hands-toes  . Scleroderma (HCC)   . Shortness of breath dyspnea    pulmonary fibrosis-scleraderma  . Sleep apnea   . Swallowing difficulty   . Thyroid cyst   . Vitamin D deficiency   . Wears glasses     PAST SURGICAL HISTORY: Past Surgical History:  Procedure Laterality Date  . CARDIAC CATHETERIZATION N/A 11/25/2015   Procedure: Right Heart Cath;  Surgeon: Dolores Patty, MD;  Location: [facility] INVASIVE CV LAB;  Service: Cardiovascular;  Laterality: N/A;  . CESAREAN SECTION  87,95  . CHOLECYSTECTOMY  1996   lap choli  . COLONOSCOPY    . ESOPHAGOGASTRODUODENOSCOPY (EGD) WITH PROPOFOL N/A 02/12/2017   Procedure: ESOPHAGOGASTRODUODENOSCOPY (EGD) WITH PROPOFOL;  Surgeon: Vida Rigger, MD;  Location: [facility];  Service: Endoscopy;   Laterality: N/A;  . HIATAL HERNIA REPAIR  07/26/2015   Procedure: LAPAROSCOPIC REPAIR OF HIATAL HERNIA;  Surgeon: Luretha Murphy, MD;  Location: [facility] ORS;  Service: General;;  . LAPAROSCOPIC GASTRIC SLEEVE RESECTION N/A 07/26/2015   Procedure: LAPAROSCOPIC GASTRIC SLEEVE RESECTION;  Surgeon: Luretha Murphy, MD;  Location: [facility] ORS;  Service: General;  Laterality: N/A;  . LEFT AND RIGHT HEART CATHETERIZATION WITH CORONARY ANGIOGRAM N/A 03/14/2015   Procedure: LEFT AND RIGHT HEART CATHETERIZATION WITH CORONARY ANGIOGRAM;  Surgeon: Peter M Swaziland, MD;  Location: [facility] CATH LAB;  Service: Cardiovascular;  Laterality: N/A;  . POSTERIOR CERVICAL FUSION/FORAMINOTOMY N/A 01/16/2018   Procedure: Cervical one-two Posterior instrumentation and fusion;  Surgeon: Tressie Stalker, MD;  Location: [facility] OR;  Service: Neurosurgery;  Laterality: N/A;  . RADIAL HEAD ARTHROPLASTY Right 07/28/2014   Procedure: RIGHT RADIAL HEAD REPLACEMENT;  Surgeon: Dairl Ponder, MD;  Location: [facility];  Service: Orthopedics;  Laterality: Right;  . RIGHT HEART CATH N/A 03/18/2017   Procedure: Right Heart Cath;  Surgeon: Dolores Patty, MD;  Location: [facility] INVASIVE CV LAB;  Service: Cardiovascular;  Laterality: N/A;  . SAVORY DILATION N/A 02/12/2017   Procedure: SAVORY DILATION;  Surgeon: Vida Rigger, MD;  Location: [facility];  Service: Endoscopy;  Laterality: N/A;  . TUBAL LIGATION    . UPPER GI ENDOSCOPY  07/26/2015   Procedure: UPPER GI ENDOSCOPY;  Surgeon: Luretha Murphy, MD;  Location: [facility];  Service: General;;  . VAGINAL HYSTERECTOMY  2010   [facility]    SOCIAL HISTORY: Social History   Tobacco Use  . Smoking status: Never Smoker  . Smokeless tobacco: Never Used  Substance Use Topics  . Alcohol use: Yes    Alcohol/week: 0.0 oz    Comment: seldom  . Drug use: No    FAMILY HISTORY: Family History  Problem Relation Age of Onset  . Heart failure Mother        Stent put in 2014 for blockage  . COPD Mother   .  Hypertension Mother   . Hyperlipidemia Mother   . Heart disease Mother   . Thyroid disease Mother   . Cancer Mother   . Depression Mother   . Anxiety disorder Mother   . Heart disease Father   . Heart disease Son        Repair ASD  . Neuropathy Neg Hx     ROS: Review of Systems  Constitutional: Negative for weight loss.  Gastrointestinal: Negative for nausea and vomiting.  Musculoskeletal:       Negative muscle weakness  Psychiatric/Behavioral: Positive for depression. Negative for suicidal ideas.    PHYSICAL EXAM: Blood pressure 105/71, pulse 100, temperature 97.8 F (36.6 C), temperature source Oral, height 5\' 4"  (1.626 m), weight 201 lb (91.2 kg), SpO2 96 %. Body mass index is 34.5 kg/m. Physical Exam  Constitutional: She is oriented to person, place, and time. She appears well-developed and well-nourished.  Cardiovascular: Normal rate.  Pulmonary/Chest: Effort normal.  Musculoskeletal: Normal range of motion.  Neurological: She is oriented to person, place, and time.  Skin: Skin is warm and dry.  Psychiatric: She has a normal mood and affect. Her behavior is normal.  Vitals reviewed.   RECENT LABS AND TESTS: BMET    Component Value Date/Time   NA 141 01/13/2018 1000   NA 143 12/10/2017 0955   K 3.6 01/13/2018 1000   CL 106 01/13/2018 1000   CO2 27 01/13/2018 1000   GLUCOSE 86 01/13/2018 1000   BUN 13 01/13/2018 1000   BUN 14 12/10/2017 0955   CREATININE 0.91 01/13/2018 1000   CREATININE 0.75 03/08/2015 1501   CALCIUM 9.2 01/13/2018 1000   GFRNONAA >60 01/13/2018 1000   GFRNONAA >89 03/08/2015 1501   GFRAA >60 01/13/2018 1000   GFRAA >89 03/08/2015 1501   Lab Results  Component Value Date   HGBA1C 4.9 12/10/2017   HGBA1C 5.0 09/10/2017   Lab Results  Component Value Date   INSULIN 4.4 12/10/2017   INSULIN 6.1 09/10/2017   CBC    Component Value Date/Time   WBC 6.2 01/13/2018 1000   RBC 4.43 01/13/2018 1000   HGB 13.5 01/13/2018 1000  HGB  13.9 09/10/2017 1108   HCT 40.7 01/13/2018 1000   HCT 41.6 09/10/2017 1108   PLT 220 01/13/2018 1000   MCV 91.9 01/13/2018 1000   MCV 90 09/10/2017 1108   MCH 30.5 01/13/2018 1000   MCHC 33.2 01/13/2018 1000   RDW 13.4 01/13/2018 1000   RDW 14.1 09/10/2017 1108   LYMPHSABS 2.3 09/10/2017 1108   MONOABS 0.9 07/28/2015 0455   EOSABS 0.2 09/10/2017 1108   BASOSABS 0.0 09/10/2017 1108   Iron/TIBC/Ferritin/ %Sat No results found for: IRON, TIBC, FERRITIN, IRONPCTSAT Lipid Panel     Component Value Date/Time   CHOL 151 09/10/2017 1108   TRIG 102 09/10/2017 1108   HDL 70 09/10/2017 1108   LDLCALC 61 09/10/2017 1108   Hepatic Function Panel     Component Value Date/Time   PROT 6.3 12/10/2017 0955   ALBUMIN 4.2 12/10/2017 0955   AST 18 12/10/2017 0955   ALT 15 12/10/2017 0955   ALKPHOS 85 12/10/2017 0955   BILITOT 0.5 12/10/2017 0955      Component Value Date/Time   TSH 2.690 09/10/2017 1108    ASSESSMENT AND PLAN: Vitamin D deficiency - Plan: Vitamin D, Ergocalciferol, (DRISDOL) 50000 units CAPS capsule  Other depression - with emotional eating - Plan: buPROPion (WELLBUTRIN SR) 200 MG 12 hr tablet  Class 1 obesity with serious comorbidity and body mass index (BMI) of 34.0 to 34.9 in adult, unspecified obesity type  PLAN:  Vitamin D Deficiency Rachael Camacho was informed that low vitamin D levels contributes to fatigue and are associated with obesity, breast, and colon cancer. She agrees to continue to take prescription Vit D @50 ,000 IU every week and will follow up for routine testing of vitamin D, at least 2-3 times per year. She was informed of the risk of over-replacement of vitamin D and agrees to not increase her dose unless she discusses this with [place] first.  Depression with Emotional Eating Behaviors We discussed behavior modification techniques today to help Rachael Camacho deal with her emotional eating and depression. She has agreed to take Wellbutrin SR 150 mg qd and agreed to  follow up as directed.  Obesity Rachael Camacho is currently in the action stage of change. As such, her goal is to continue with weight loss efforts She has agreed to keep a food journal with 1000-1300 calories and 75 grams of protein daily Rachael Camacho has been instructed to work up to a goal of 150 minutes of combined cardio and strengthening exercise per week or increase strengthening exercise per week for weight loss and overall health benefits. We discussed the following Behavioral Modification Strategies today: increasing lean protein intake, work on meal planning and easy cooking plans, emotional eating strategies, and travel eating strategies   Rachael Camacho has agreed to follow up with our clinic in 3 weeks. She was informed of the importance of frequent follow up visits to maximize her success with intensive lifestyle modifications for her multiple health conditions.   OBESITY BEHAVIORAL INTERVENTION VISIT  Today's visit was # 11 out of 22.  Starting weight: 207 lbs Starting date: 09/10/17 Today's weight : 201 lbs Today's date: 04/01/2018 Total lbs lost to date: 6 (Patients must lose 7 lbs in the first 6 months to continue with counseling)   ASK: We discussed the diagnosis of obesity with Rachael Camacho today and Rachael Camacho agreed to give [place] permission to discuss obesity behavioral modification therapy today.  ASSESS: [place] has the diagnosis of obesity and her BMI today is 34.48 Rachael Camacho is  in the action stage of change   ADVISE: Rachael Camacho was educated on the multiple health risks of obesity as well as the benefit of weight loss to improve her health. She was advised of the need for long term treatment and the importance of lifestyle modifications.  AGREE: Multiple dietary modification options and treatment options were discussed and  Rachael Camacho agreed to the above obesity treatment plan.  I, Burt KnackSharon Wadel, am acting as transcriptionist for Quillian Quincearen Rhia Blatchford, MD  I have reviewed the above documentation  for accuracy and completeness, and I agree with the above. -Quillian Quincearen Cadan Maggart, MD

## 2018-04-07 ENCOUNTER — Telehealth: Payer: Self-pay | Admitting: *Deleted

## 2018-04-07 NOTE — Telephone Encounter (Signed)
Received a request for 90 day supply of Gabapentin 100 mg. Prescription is written as: Take 1 tablet three times daily. There is documentation of pt taking differently: 1 tablet at bedtime. Called pt and discussed. She has been taking 1 tablet at bedtime. She would like to keep the prescription as is in case she needs to take more daily.

## 2018-04-29 ENCOUNTER — Other Ambulatory Visit (INDEPENDENT_AMBULATORY_CARE_PROVIDER_SITE_OTHER): Payer: Self-pay | Admitting: Family Medicine

## 2018-04-29 ENCOUNTER — Ambulatory Visit (INDEPENDENT_AMBULATORY_CARE_PROVIDER_SITE_OTHER): Payer: BLUE CROSS/BLUE SHIELD | Admitting: Family Medicine

## 2018-04-29 VITALS — BP 119/81 | HR 81 | Temp 98.3°F | Ht 64.0 in | Wt 199.0 lb

## 2018-04-29 DIAGNOSIS — E669 Obesity, unspecified: Secondary | ICD-10-CM

## 2018-04-29 DIAGNOSIS — E559 Vitamin D deficiency, unspecified: Secondary | ICD-10-CM

## 2018-04-29 DIAGNOSIS — F3289 Other specified depressive episodes: Secondary | ICD-10-CM

## 2018-04-29 DIAGNOSIS — Z6834 Body mass index (BMI) 34.0-34.9, adult: Secondary | ICD-10-CM

## 2018-04-29 DIAGNOSIS — Z9189 Other specified personal risk factors, not elsewhere classified: Secondary | ICD-10-CM

## 2018-04-29 MED ORDER — VITAMIN D (ERGOCALCIFEROL) 1.25 MG (50000 UNIT) PO CAPS: 50000.0000 [IU] | ORAL_CAPSULE | ORAL | 1 refills | Status: DC

## 2018-04-29 MED ORDER — BUPROPION HCL ER (SR) 200 MG PO TB12: 200.0000 mg | ORAL_TABLET | Freq: Every day | ORAL | 0 refills | Status: DC

## 2018-05-01 NOTE — Progress Notes (Signed)
Office: [phone]  /  Fax: [phone]   HPI:   Chief Complaint: OBESITY Rachael Camacho is here to discuss her progress with her obesity treatment plan. She is on the keep a food journal with 1000 to 1300 calories and 75 grams of protein daily and is following her eating plan approximately 75 % of the time. She states she is exercising 0 minutes 0 times per week. Rachael Camacho had multiple celebrations in the last month, but she increased her walking. She is working on Clinical cytogeneticist protein. Rachael Camacho is struggling with skipping breakfast. Her weight is 199 lb (90.3 kg) today and has had a weight loss of 2 pounds over a period of 4 weeks since her last visit. She has lost 8 lbs since starting treatment with [place].  Vitamin D deficiency Rachael Camacho has a diagnosis of vitamin D deficiency. She is at goal on prescription vit D and is at risk for over replacement now, but she cannot get labs done today. She denies nausea, vomiting or muscle weakness.  At risk for osteopenia and osteoporosis Rachael Camacho is at higher risk of osteopenia and osteoporosis due to vitamin D deficiency.   Depression with emotional eating behaviors Rachael Camacho is struggling with emotional eating and using food for comfort to the extent that it is negatively impacting her health. She often snacks when she is not hungry. Rachael Camacho sometimes feels she is out of control and then feels guilty that she made poor food choices. Her mood is stable on wellbutrin. She is struggling with some insomnia since her surgery. She has been working on behavior modification techniques to help reduce her emotional eating and has been somewhat successful. She shows no sign of suicidal or homicidal ideations.  Depression screen Rachael Camacho 2/9 09/10/2017 10/18/2016 07/18/2016 05/02/2016 03/01/2016  Decreased Interest 2 0 0 0 0  Down, Depressed, Hopeless 2 0 0 0 0  PHQ - 2 Score 4 0 0 0 0  Altered sleeping 1 - - - -  Tired, decreased energy 3 - - - -  Change in appetite 1 - - - -    Feeling bad or failure about yourself  3 - - - -  Trouble concentrating 2 - - - -  Moving slowly or fidgety/restless 2 - - - -  Suicidal thoughts 0 - - - -  PHQ-9 Score 16 - - - -  Difficult doing work/chores Somewhat difficult - - - -      ALLERGIES: Allergies  Allergen Reactions  . Amoxicillin Other (See Comments)    Immediate yeast infection  Has patient had a PCN reaction causing immediate rash, facial/tongue/throat swelling, SOB or lightheadedness with hypotension: No Has patient had a PCN reaction causing severe rash involving mucus membranes or skin necrosis: No Has patient had a PCN reaction that required hospitalization: No Has patient had a PCN reaction occurring within the last 10 years: No If all of the above answers are "NO", then may proceed with Cephalosporin use.   [name] Percocet [Oxycodone-Acetaminophen] Itching    Tolerates acetaminophen alone  . [name] [Nitrofurantoin Monohyd Macro] Rash and Hives  . [name] Hives and Rash    Reported chicken pox-like rash, not hives  . Sulfa Antibiotics Rash and Hives    MEDICATIONS: Current Outpatient Medications on File Prior to Visit  Medication Sig Dispense Refill  . baclofen (LIORESAL) 10 MG tablet Take 1 tablet (10 mg total) by mouth 3 (three) times daily. (Patient taking differently: Take 10 mg by mouth at bedtime. ) 90 each  11  . Biotin 5000 MCG CAPS Take 5,000 mcg by mouth daily.    . Cyanocobalamin (VITAMIN B-12) 5000 MCG SUBL Take 5,000 mcg by mouth daily.    [name] desloratadine (CLARINEX) 5 MG tablet Take 5 mg by mouth at bedtime.   12  . diclofenac sodium (VOLTAREN) 1 % GEL Apply 2-4 g topically 4 (four) times daily as needed (for knee pain.).    [name] fluticasone (FLONASE) 50 MCG/ACT nasal spray Place 1 spray into both nostrils at bedtime. Aller-Flo    . furosemide (LASIX) 20 MG tablet TAKE 1 TABLET (20 MG TOTAL) BY MOUTH DAILY. 90 tablet 3  . gabapentin (NEURONTIN) 100 MG capsule Take 1 capsule (100 mg total) by  mouth 3 (three) times daily. (Patient taking differently: Take 100 mg by mouth at bedtime. ) 90 capsule 6  . levothyroxine (SYNTHROID, LEVOTHROID) 50 MCG tablet Take 25 mcg by mouth daily before breakfast.   6  . Macitentan (OPSUMIT) 10 MG TABS Take 1 tablet (10 mg total) by mouth daily. 90 tablet 3  . Multiple Vitamin (MULTIVITAMIN WITH MINERALS) TABS tablet Take 1 tablet by mouth daily.    . Probiotic Product (PROBIOTIC PO) Take 1 capsule by mouth daily.     No current facility-administered medications on file prior to visit.     PAST MEDICAL HISTORY: Past Medical History:  Diagnosis Date  . Anemia    as child in first grade  . Anxiety   . Arthritis   . Back pain   . Constipation   . CREST syndrome (HCC)   . Dyspnea   . Family history of adverse reaction to anesthesia    mom has had problems in past - N/V post op on one occasion; difficult time waking up on another occasion  . Gallbladder problem   . GERD (gastroesophageal reflux disease)   . Gestational diabetes mellitus in childbirth, diet controlled 1987  . IBS (irritable bowel syndrome)   . Joint pain   . Leg edema   . Menopause   . Osteoarthritis (arthritis due to wear and tear of joints)   . PAH (pulmonary artery hypertension) (HCC)   . Pulmonary fibrosis (HCC)   . Raynaud disease    hands-toes  . Scleroderma (HCC)   . Shortness of breath dyspnea    pulmonary fibrosis-scleraderma  . Sleep apnea   . Swallowing difficulty   . Thyroid cyst   . Vitamin D deficiency   . Wears glasses     PAST SURGICAL HISTORY: Past Surgical History:  Procedure Laterality Date  . CARDIAC CATHETERIZATION N/A 11/25/2015   Procedure: Right Heart Cath;  Surgeon: Dolores Patty, MD;  Location: [facility] INVASIVE CV LAB;  Service: Cardiovascular;  Laterality: N/A;  . CESAREAN SECTION  87,95  . CHOLECYSTECTOMY  1996   lap choli  . COLONOSCOPY    . ESOPHAGOGASTRODUODENOSCOPY (EGD) WITH PROPOFOL N/A 02/12/2017   Procedure:  ESOPHAGOGASTRODUODENOSCOPY (EGD) WITH PROPOFOL;  Surgeon: Vida Rigger, MD;  Location: [facility];  Service: Endoscopy;  Laterality: N/A;  . HIATAL HERNIA REPAIR  07/26/2015   Procedure: LAPAROSCOPIC REPAIR OF HIATAL HERNIA;  Surgeon: Luretha Murphy, MD;  Location: [facility] ORS;  Service: General;;  . LAPAROSCOPIC GASTRIC SLEEVE RESECTION N/A 07/26/2015   Procedure: LAPAROSCOPIC GASTRIC SLEEVE RESECTION;  Surgeon: Luretha Murphy, MD;  Location: [facility];  Service: General;  Laterality: N/A;  . LEFT AND RIGHT HEART CATHETERIZATION WITH CORONARY ANGIOGRAM N/A 03/14/2015   Procedure: LEFT AND RIGHT HEART CATHETERIZATION WITH CORONARY ANGIOGRAM;  Surgeon: Peter M Swaziland, MD;  Location: [facility] CATH LAB;  Service: Cardiovascular;  Laterality: N/A;  . POSTERIOR CERVICAL FUSION/FORAMINOTOMY N/A 01/16/2018   Procedure: Cervical one-two Posterior instrumentation and fusion;  Surgeon: Tressie Stalker, MD;  Location: [facility] OR;  Service: Neurosurgery;  Laterality: N/A;  . RADIAL HEAD ARTHROPLASTY Right 07/28/2014   Procedure: RIGHT RADIAL HEAD REPLACEMENT;  Surgeon: Dairl Ponder, MD;  Location: [facility];  Service: Orthopedics;  Laterality: Right;  . RIGHT HEART CATH N/A 03/18/2017   Procedure: Right Heart Cath;  Surgeon: Dolores Patty, MD;  Location: [facility] INVASIVE CV LAB;  Service: Cardiovascular;  Laterality: N/A;  . SAVORY DILATION N/A 02/12/2017   Procedure: SAVORY DILATION;  Surgeon: Vida Rigger, MD;  Location: [facility];  Service: Endoscopy;  Laterality: N/A;  . TUBAL LIGATION    . UPPER GI ENDOSCOPY  07/26/2015   Procedure: UPPER GI ENDOSCOPY;  Surgeon: Luretha Murphy, MD;  Location: [facility];  Service: General;;  . VAGINAL HYSTERECTOMY  2010   LAV[facility]    SOCIAL HISTORY: Social History   Tobacco Use  . Smoking status: Never Smoker  . Smokeless tobacco: Never Used  Substance Use Topics  . Alcohol use: Yes    Alcohol/week: 0.0 oz    Comment: seldom  . Drug use: No    FAMILY HISTORY: Family  History  Problem Relation Age of Onset  . Heart failure Mother        Stent put in 2014 for blockage  . COPD Mother   . Hypertension Mother   . Hyperlipidemia Mother   . Heart disease Mother   . Thyroid disease Mother   . Cancer Mother   . Depression Mother   . Anxiety disorder Mother   . Heart disease Father   . Heart disease Son        Repair ASD  . Neuropathy Neg Hx     ROS: Review of Systems  Constitutional: Positive for weight loss.  Gastrointestinal: Negative for nausea and vomiting.  Musculoskeletal:       Negative for muscle weakness  Psychiatric/Behavioral: Positive for depression. Negative for suicidal ideas. The patient has insomnia.     PHYSICAL EXAM: Blood pressure 119/81, pulse 81, temperature 98.3 F (36.8 C), temperature source Oral, height  (1.626 m), weight 199 lb (90.3 kg), SpO2 90 %. Body mass index is 34.16 kg/m. Physical Exam  Constitutional: She is oriented to person, place, and time. She appears well-developed and well-nourished.  Cardiovascular: Normal rate.  Pulmonary/Chest: Effort normal.  Musculoskeletal: Normal range of motion.  Neurological: She is oriented to person, place, and time.  Skin: Skin is warm and dry.  Psychiatric: She has a normal mood and affect. Her behavior is normal.  Vitals reviewed.   RECENT LABS AND TESTS: BMET    Component Value Date/Time   NA 141 01/13/2018 1000   NA 143 12/10/2017 0955   K 3.6 01/13/2018 1000   CL 106 01/13/2018 1000   CO2 27 01/13/2018 1000   GLUCOSE 86 01/13/2018 1000   BUN 13 01/13/2018 1000   BUN 14 12/10/2017 0955   CREATININE 0.91 01/13/2018 1000   CREATININE 0.75 03/08/2015 1501   CALCIUM 9.2 01/13/2018 1000   GFRNONAA >60 01/13/2018 1000   GFRNONAA >89 03/08/2015 1501   GFRAA >60 01/13/2018 1000   GFRAA >89 03/08/2015 1501   Lab Results  Component Value Date   HGBA1C 4.9 12/10/2017   HGBA1C 5.0 09/10/2017   Lab Results  Component Value Date  INSULIN 4.4 12/10/2017     INSULIN 6.1 09/10/2017   CBC    Component Value Date/Time   WBC 6.2 01/13/2018 1000   RBC 4.43 01/13/2018 1000   HGB 13.5 01/13/2018 1000   HGB 13.9 09/10/2017 1108   HCT 40.7 01/13/2018 1000   HCT 41.6 09/10/2017 1108   PLT 220 01/13/2018 1000   MCV 91.9 01/13/2018 1000   MCV 90 09/10/2017 1108   MCH 30.5 01/13/2018 1000   MCHC 33.2 01/13/2018 1000   RDW 13.4 01/13/2018 1000   RDW 14.1 09/10/2017 1108   LYMPHSABS 2.3 09/10/2017 1108   MONOABS 0.9 07/28/2015 0455   EOSABS 0.2 09/10/2017 1108   BASOSABS 0.0 09/10/2017 1108   Iron/TIBC/Ferritin/ %Sat No results found for: IRON, TIBC, FERRITIN, IRONPCTSAT Lipid Panel     Component Value Date/Time   CHOL 151 09/10/2017 1108   TRIG 102 09/10/2017 1108   HDL 70 09/10/2017 1108   LDLCALC 61 09/10/2017 1108   Hepatic Function Panel     Component Value Date/Time   PROT 6.3 12/10/2017 0955   ALBUMIN 4.2 12/10/2017 0955   AST 18 12/10/2017 0955   ALT 15 12/10/2017 0955   ALKPHOS 85 12/10/2017 0955   BILITOT 0.5 12/10/2017 0955      Component Value Date/Time   TSH 2.690 09/10/2017 1108   Results for Rachael Camacho, Rachael Camacho (MRN [id number]) as of 05/01/2018 15:29  Ref. Range 12/10/2017 09:55  Vitamin D, 25-Hydroxy Latest Ref Range: 30.0 - 100.0 ng/mL 51.3   ASSESSMENT AND PLAN: Vitamin D deficiency - Plan: Vitamin D, Ergocalciferol, (DRISDOL) 50000 units CAPS capsule  Other depression - with emotional eating - Plan: buPROPion (WELLBUTRIN SR) 200 MG 12 hr tablet  At risk for osteoporosis  Class 1 obesity with serious comorbidity and body mass index (BMI) of 34.0 to 34.9 in adult, unspecified obesity type  PLAN:  Vitamin D Deficiency Rachael Camacho was informed that low vitamin D levels contributes to fatigue and are associated with obesity, breast, and colon cancer. She agrees to continue to take prescription Vit D ,000 IU every week #4 with no refills and will follow up for routine testing of vitamin D, at least 2-3 times per  year. She was informed of the risk of over-replacement of vitamin D and agrees to not increase her dose unless she discusses this with [place] first. Rachael Camacho agrees to follow up with our clinic in 3 to 4 weeks.  At risk for osteopenia and osteoporosis Rachael Camacho is at risk for osteopenia and osteoporosis due to her vitamin D deficiency. She was encouraged to take her vitamin D and follow her higher calcium diet and increase strengthening exercise to help strengthen her bones and decrease her risk of osteopenia and osteoporosis.  Depression with Emotional Eating Behaviors We discussed behavior modification techniques today to help Rachael Camacho deal with her emotional eating and depression. We discussed sleep hygiene techniques. She has agreed to take Wellbutrin SR 200 mg qd #30 with no refills and follow up as directed.  Obesity Rachael Camacho is currently in the action stage of change. As such, her goal is to continue with weight loss efforts She has agreed to keep a food journal with 1100 to 1300 calories and 75+ grams of protein daily Rachael Camacho has been instructed to work up to a goal of 150 minutes of combined cardio and strengthening exercise per week for weight loss and overall health benefits. We discussed the following Behavioral Modification Strategies today: no skipping meals, increasing lean protein intake and  emotional eating strategies  Rachael Camacho has agreed to follow up with our clinic in 3 to 4 weeks. She was informed of the importance of frequent follow up visits to maximize her success with intensive lifestyle modifications for her multiple health conditions.   OBESITY BEHAVIORAL INTERVENTION VISIT  Today's visit was # 12 out of 22.  Starting weight: 207 lbs Starting date: 09/10/17 Today's weight : 199 lbs Today's date: 04/29/2018 Total lbs lost to date: 8 (Patients must lose 7 lbs in the first 6 months to continue with counseling)   ASK: We discussed the diagnosis of obesity with Rachael Camacho today  and Rachael Camacho agreed to give [place] permission to discuss obesity behavioral modification therapy today.  ASSESS: Rachael Camacho has the diagnosis of obesity and her BMI today is 34.14 Rachael Camacho is in the action stage of change   ADVISE: Rachael Camacho was educated on the multiple health risks of obesity as well as the benefit of weight loss to improve her health. She was advised of the need for long term treatment and the importance of lifestyle modifications.  AGREE: Multiple dietary modification options and treatment options were discussed and  Rachael Camacho agreed to the above obesity treatment plan.  I, [place] Crane, am acting as transcriptionist for Quillian Quince, MD  I have reviewed the above documentation for accuracy and completeness, and I agree with the above. -Quillian Quince, MD

## 2018-05-23 ENCOUNTER — Other Ambulatory Visit (INDEPENDENT_AMBULATORY_CARE_PROVIDER_SITE_OTHER): Payer: Self-pay | Admitting: Family Medicine

## 2018-05-23 DIAGNOSIS — E559 Vitamin D deficiency, unspecified: Secondary | ICD-10-CM

## 2018-05-25 ENCOUNTER — Other Ambulatory Visit (INDEPENDENT_AMBULATORY_CARE_PROVIDER_SITE_OTHER): Payer: Self-pay | Admitting: Family Medicine

## 2018-05-25 DIAGNOSIS — F3289 Other specified depressive episodes: Secondary | ICD-10-CM

## 2018-05-27 ENCOUNTER — Ambulatory Visit (INDEPENDENT_AMBULATORY_CARE_PROVIDER_SITE_OTHER): Payer: BLUE CROSS/BLUE SHIELD | Admitting: Family Medicine

## 2018-05-27 VITALS — BP 108/74 | HR 71 | Temp 97.5°F | Ht 64.0 in | Wt 199.0 lb

## 2018-05-27 DIAGNOSIS — Z6834 Body mass index (BMI) 34.0-34.9, adult: Secondary | ICD-10-CM

## 2018-05-27 DIAGNOSIS — E669 Obesity, unspecified: Secondary | ICD-10-CM

## 2018-05-27 DIAGNOSIS — Z9189 Other specified personal risk factors, not elsewhere classified: Secondary | ICD-10-CM | POA: Diagnosis not present

## 2018-05-27 DIAGNOSIS — Z9889 Other specified postprocedural states: Secondary | ICD-10-CM | POA: Diagnosis not present

## 2018-05-27 DIAGNOSIS — E66811 Obesity, class 1: Secondary | ICD-10-CM

## 2018-05-27 DIAGNOSIS — G4709 Other insomnia: Secondary | ICD-10-CM

## 2018-05-27 DIAGNOSIS — F3289 Other specified depressive episodes: Secondary | ICD-10-CM | POA: Diagnosis not present

## 2018-05-27 DIAGNOSIS — E559 Vitamin D deficiency, unspecified: Secondary | ICD-10-CM | POA: Diagnosis not present

## 2018-05-27 MED ORDER — VITAMIN D (ERGOCALCIFEROL) 1.25 MG (50000 UNIT) PO CAPS: 50000.0000 [IU] | ORAL_CAPSULE | ORAL | 1 refills | Status: DC

## 2018-05-27 MED ORDER — BUPROPION HCL ER (SR) 200 MG PO TB12: 200.0000 mg | ORAL_TABLET | Freq: Every day | ORAL | 0 refills | Status: DC

## 2018-05-27 NOTE — Progress Notes (Signed)
Office: [phone]  /  Fax: [phone]   HPI:   Chief Complaint: OBESITY Rachael Camacho is here to discuss her progress with her obesity treatment plan. She is on the keep a food journal with 1100-1300 calories and 75+ grams of protein daily and is following her eating plan approximately 70 % of the time. She states she is exercising 0 minutes 0 times per week. Rachael Camacho has done well maintaining weight, journals on and off, working on increasing protein but not always meeting her goal.  Her weight is 199 lb (90.3 kg) today and has not lost weight since her last visit. She has lost 8 lbs since starting treatment with [place].  Insomnia Rachael Camacho notes difficulty falling asleep recently, reads on a tablet and then thinks about story plot which keeps her awake.  Vitamin D Deficiency Rachael Camacho has a diagnosis of vitamin D deficiency. She is stable on prescription Vit D and denies nausea, vomiting or muscle weakness. She is missing her Ca+ dose frequently.  At risk for osteopenia and osteoporosis Rachael Camacho is at higher risk of osteopenia and osteoporosis due to vitamin D deficiency.   Depression with emotional eating behaviors Rachael Camacho's mood is stable on Wellbutrin, she notes insomnia recently but doesn't seem to be related to Wellbutrin. Her blood pressure is stable and she is doing better with emotional eating. Rachael Camacho struggles with emotional eating and using food for comfort to the extent that it is negatively impacting her health. She often snacks when she is not hungry. Rachael Camacho sometimes feels she is out of control and then feels guilty that she made poor food choices. She has been working on behavior modification techniques to help reduce her emotional eating and has been somewhat successful. She shows no sign of suicidal or homicidal ideations.  Depression screen [facility] 2/9 09/10/2017 10/18/2016 07/18/2016 05/02/2016 03/01/2016  Decreased Interest 2 0 0 0 0  Down, Depressed, Hopeless 2 0 0 0 0  PHQ - 2 Score 4 0 0  0 0  Altered sleeping 1 - - - -  Tired, decreased energy 3 - - - -  Change in appetite 1 - - - -  Feeling bad or failure about yourself  3 - - - -  Trouble concentrating 2 - - - -  Moving slowly or fidgety/restless 2 - - - -  Suicidal thoughts 0 - - - -  PHQ-9 Score 16 - - - -  Difficult doing work/chores Somewhat difficult - - - -    ALLERGIES: Allergies  Allergen Reactions  . Amoxicillin Other (See Comments)    Immediate yeast infection  Has patient had a PCN reaction causing immediate rash, facial/tongue/throat swelling, SOB or lightheadedness with hypotension: No Has patient had a PCN reaction causing severe rash involving mucus membranes or skin necrosis: No Has patient had a PCN reaction that required hospitalization: No Has patient had a PCN reaction occurring within the last 10 years: No If all of the above answers are "NO", then may proceed with Cephalosporin use.   [name] Percocet [Oxycodone-Acetaminophen] Itching    Tolerates acetaminophen alone  . [name] [Nitrofurantoin Monohyd Macro] Rash and Hives  . [name] Hives and Rash    Reported chicken pox-like rash, not hives  . Sulfa Antibiotics Rash and Hives    MEDICATIONS: Current Outpatient Medications on File Prior to Visit  Medication Sig Dispense Refill  . baclofen (LIORESAL) 10 MG tablet Take 1 tablet (10 mg total) by mouth 3 (three) times daily. (Patient taking differently: Take 10  mg by mouth at bedtime. ) 90 each 11  . Biotin 5000 MCG CAPS Take 5,000 mcg by mouth daily.    [name] buPROPion (WELLBUTRIN SR) 200 MG 12 hr tablet Take 1 tablet (200 mg total) by mouth daily. 30 tablet 0  . Cyanocobalamin (VITAMIN B-12) 5000 MCG SUBL Take 5,000 mcg by mouth daily.    [name] desloratadine (CLARINEX) 5 MG tablet Take 5 mg by mouth at bedtime.   12  . diclofenac sodium (VOLTAREN) 1 % GEL Apply 2-4 g topically 4 (four) times daily as needed (for knee pain.).    [name] fluticasone (FLONASE) 50 MCG/ACT nasal spray Place 1 spray into  both nostrils at bedtime. Aller-Flo    . furosemide (LASIX) 20 MG tablet TAKE 1 TABLET (20 MG TOTAL) BY MOUTH DAILY. 90 tablet 3  . gabapentin (NEURONTIN) 100 MG capsule Take 1 capsule (100 mg total) by mouth 3 (three) times daily. (Patient taking differently: Take 100 mg by mouth at bedtime. ) 90 capsule 6  . levothyroxine (SYNTHROID, LEVOTHROID) 50 MCG tablet Take 25 mcg by mouth daily before breakfast.   6  . Macitentan (OPSUMIT) 10 MG TABS Take 1 tablet (10 mg total) by mouth daily. 90 tablet 3  . Multiple Vitamin (MULTIVITAMIN WITH MINERALS) TABS tablet Take 1 tablet by mouth daily.    . Probiotic Product (PROBIOTIC PO) Take 1 capsule by mouth daily.    . Vitamin D, Ergocalciferol, (DRISDOL) 50000 units CAPS capsule Take 1 capsule (50,000 Units total) by mouth every 7 (seven) days. 4 capsule 1   No current facility-administered medications on file prior to visit.     PAST MEDICAL HISTORY: Past Medical History:  Diagnosis Date  . Anemia    as child in first grade  . Anxiety   . Arthritis   . Back pain   . Constipation   . CREST syndrome (HCC)   . Dyspnea   . Family history of adverse reaction to anesthesia    mom has had problems in past - N/V post op on one occasion; difficult time waking up on another occasion  . Gallbladder problem   . GERD (gastroesophageal reflux disease)   . Gestational diabetes mellitus in childbirth, diet controlled 1987  . IBS (irritable bowel syndrome)   . Joint pain   . Leg edema   . Menopause   . Osteoarthritis (arthritis due to wear and tear of joints)   . PAH (pulmonary artery hypertension) (HCC)   . Pulmonary fibrosis (HCC)   . Raynaud disease    hands-toes  . Scleroderma (HCC)   . Shortness of breath dyspnea    pulmonary fibrosis-scleraderma  . Sleep apnea   . Swallowing difficulty   . Thyroid cyst   . Vitamin D deficiency   . Wears glasses     PAST SURGICAL HISTORY: Past Surgical History:  Procedure Laterality Date  . CARDIAC  CATHETERIZATION N/A 11/25/2015   Procedure: Right Heart Cath;  Surgeon: Dolores Patty, MD;  Location: [facility] INVASIVE CV LAB;  Service: Cardiovascular;  Laterality: N/A;  . CESAREAN SECTION  87,95  . CHOLECYSTECTOMY  1996   lap choli  . COLONOSCOPY    . ESOPHAGOGASTRODUODENOSCOPY (EGD) WITH PROPOFOL N/A 02/12/2017   Procedure: ESOPHAGOGASTRODUODENOSCOPY (EGD) WITH PROPOFOL;  Surgeon: Vida Rigger, MD;  Location: [facility] ENDOSCOPY;  Service: Endoscopy;  Laterality: N/A;  . HIATAL HERNIA REPAIR  07/26/2015   Procedure: LAPAROSCOPIC REPAIR OF HIATAL HERNIA;  Surgeon: Luretha Murphy, MD;  Location: [facility] ORS;  Service:  General;;  . LAPAROSCOPIC GASTRIC SLEEVE RESECTION N/A 07/26/2015   Procedure: LAPAROSCOPIC GASTRIC SLEEVE RESECTION;  Surgeon: Luretha Murphy, MD;  Location: [facility] ORS;  Service: General;  Laterality: N/A;  . LEFT AND RIGHT HEART CATHETERIZATION WITH CORONARY ANGIOGRAM N/A 03/14/2015   Procedure: LEFT AND RIGHT HEART CATHETERIZATION WITH CORONARY ANGIOGRAM;  Surgeon: Peter M Swaziland, MD;  Location[facility] CATH LAB;  Service: Cardiovascular;  Laterality: N/A;  . POSTERIOR CERVICAL FUSION/FORAMINOTOMY N/A 01/16/2018   Procedure: Cervical one-two Posterior instrumentation and fusion;  Surgeon: Tressie Stalker, MD;  Location: [facility] OR;  Service: Neurosurgery;  Laterality: N/A;  . RADIAL HEAD ARTHROPLASTY Right 07/28/2014   Procedure: RIGHT RADIAL HEAD REPLACEMENT;  Surgeon: Dairl Ponder, MD;  Location: [facility];  Service: Orthopedics;  Laterality: Right;  . RIGHT HEART CATH N/A 03/18/2017   Procedure: Right Heart Cath;  Surgeon: Dolores Patty, MD;  Location: [facility] INVASIVE CV LAB;  Service: Cardiovascular;  Laterality: N/A;  . SAVORY DILATION N/A 02/12/2017   Procedure: SAVORY DILATION;  Surgeon: Vida Rigger, MD;  Location: [facility] ENDOSCOPY;  Service: Endoscopy;  Laterality: N/A;  . TUBAL LIGATION    . UPPER GI ENDOSCOPY  07/26/2015   Procedure: UPPER GI ENDOSCOPY;  Surgeon: Luretha Murphy, MD;   Location: [facility] ORS;  Service: General;;  . VAGINAL HYSTERECTOMY  2010   LAVH    SOCIAL HISTORY: Social History   Tobacco Use  . Smoking status: Never Smoker  . Smokeless tobacco: Never Used  Substance Use Topics  . Alcohol use: Yes    Alcohol/week: 0.0 oz    Comment: seldom  . Drug use: No    FAMILY HISTORY: Family History  Problem Relation Age of Onset  . Heart failure Mother        Stent put in 2014 for blockage  . COPD Mother   . Hypertension Mother   . Hyperlipidemia Mother   . Heart disease Mother   . Thyroid disease Mother   . Cancer Mother   . Depression Mother   . Anxiety disorder Mother   . Heart disease Father   . Heart disease Son        Repair ASD  . Neuropathy Neg Hx     ROS: Review of Systems  Constitutional: Negative for weight loss.  Gastrointestinal: Negative for nausea and vomiting.  Musculoskeletal:       Negative muscle weakness  Psychiatric/Behavioral: Positive for depression. Negative for suicidal ideas. The patient has insomnia.     PHYSICAL EXAM: Blood pressure 108/74, pulse 71, temperature (!) 97.5 F (36.4 C), temperature source Oral, height 5\' 4"  (1.626 m), weight 199 lb (90.3 kg), SpO2 96 %. Body mass index is 34.16 kg/m. Physical Exam  Constitutional: She is oriented to person, place, and time. She appears well-developed and well-nourished.  Cardiovascular: Normal rate.  Pulmonary/Chest: Effort normal.  Musculoskeletal: Normal range of motion.  Neurological: She is oriented to person, place, and time.  Skin: Skin is warm and dry.  Psychiatric: She has a normal mood and affect. Her behavior is normal.  Vitals reviewed.   RECENT LABS AND TESTS: BMET    Component Value Date/Time   NA 141 01/13/2018 1000   NA 143 12/10/2017 0955   K 3.6 01/13/2018 1000   CL 106 01/13/2018 1000   CO2 27 01/13/2018 1000   GLUCOSE 86 01/13/2018 1000   BUN 13 01/13/2018 1000   BUN 14 12/10/2017 0955   CREATININE 0.91 01/13/2018 1000    CREATININE 0.75 03/08/2015 1501  CALCIUM 9.2 01/13/2018 1000   GFRNONAA >60 01/13/2018 1000   GFRNONAA >89 03/08/2015 1501   GFRAA >60 01/13/2018 1000   GFRAA >89 03/08/2015 1501   Lab Results  Component Value Date   HGBA1C 4.9 12/10/2017   HGBA1C 5.0 09/10/2017   Lab Results  Component Value Date   INSULIN 4.4 12/10/2017   INSULIN 6.1 09/10/2017   CBC    Component Value Date/Time   WBC 6.2 01/13/2018 1000   RBC 4.43 01/13/2018 1000   HGB 13.5 01/13/2018 1000   HGB 13.9 09/10/2017 1108   HCT 40.7 01/13/2018 1000   HCT 41.6 09/10/2017 1108   PLT 220 01/13/2018 1000   MCV 91.9 01/13/2018 1000   MCV 90 09/10/2017 1108   MCH 30.5 01/13/2018 1000   MCHC 33.2 01/13/2018 1000   RDW 13.4 01/13/2018 1000   RDW 14.1 09/10/2017 1108   LYMPHSABS 2.3 09/10/2017 1108   MONOABS 0.9 07/28/2015 0455   EOSABS 0.2 09/10/2017 1108   BASOSABS 0.0 09/10/2017 1108   Iron/TIBC/Ferritin/ %Sat No results found for: IRON, TIBC, FERRITIN, IRONPCTSAT Lipid Panel     Component Value Date/Time   CHOL 151 09/10/2017 1108   TRIG 102 09/10/2017 1108   HDL 70 09/10/2017 1108   LDLCALC 61 09/10/2017 1108   Hepatic Function Panel     Component Value Date/Time   PROT 6.3 12/10/2017 0955   ALBUMIN 4.2 12/10/2017 0955   AST 18 12/10/2017 0955   ALT 15 12/10/2017 0955   ALKPHOS 85 12/10/2017 0955   BILITOT 0.5 12/10/2017 0955      Component Value Date/Time   TSH 2.690 09/10/2017 1108    ASSESSMENT AND PLAN: Vitamin D deficiency - Plan: Vitamin D, Ergocalciferol, (DRISDOL) 50000 units CAPS capsule  Other insomnia  Other depression - with emotional eating - Plan: buPROPion (WELLBUTRIN SR) 200 MG 12 hr tablet  Status post gastric surgery  At risk for osteoporosis  Class 1 obesity with serious comorbidity and body mass index (BMI) of 34.0 to 34.9 in adult, unspecified obesity type  PLAN:  Insomnia Sleep hygiene was discussed such as no screens for 1 hour before bed, and Rachael Camacho  agrees to follow up with our clinic in 3 to 4 weeks.  Vitamin D Deficiency Rachael Camacho was informed that low vitamin D levels contributes to fatigue and are associated with obesity, breast, and colon cancer. Jatoya agrees to continue taking prescription Vit D @50 ,000 IU every week #4 and we will refill for 1 month, and she is to change Ca+ to PM. She will follow up for routine testing of vitamin D, at least 2-3 times per year. She was informed of the risk of over-replacement of vitamin D and agrees to not increase her dose unless she discusses this with [place] first. Rachael Camacho agrees to follow up with our clinic in 3 to 4 weeks and we will check labs at that time.  At risk for osteopenia and osteoporosis Rachael Camacho is at risk for osteopenia and osteoporsis due to her vitamin D deficiency. She was encouraged to take her vitamin D and follow her higher calcium diet and increase strengthening exercise to help strengthen her bones and decrease her risk of osteopenia and osteoporosis.  Depression with Emotional Eating Behaviors We discussed behavior modification techniques today to help Rachael Camacho deal with her emotional eating and depression. Rachael Camacho agrees to continue taking Wellbutrin SR 200 mg qd #30 and we will refill for 1 month.  Obesity Rachael Camacho is currently in the action stage of  change. As such, her goal is to continue with weight loss efforts She has agreed to keep a food journal with 1100-1300 calories and 75+ grams of protein daily Rachael Camacho has been instructed to work up to a goal of 150 minutes of combined cardio and strengthening exercise per week for weight loss and overall health benefits. We discussed the following Behavioral Modification Strategies today: increasing lean protein intake and decreasing simple carbohydrates    Rachael Camacho has agreed to follow up with our clinic in 3 to 4 weeks. She was informed of the importance of frequent follow up visits to maximize her success with intensive lifestyle  modifications for her multiple health conditions.   OBESITY BEHAVIORAL INTERVENTION VISIT  Today's visit was # 13 out of 22.  Starting weight: 207 lbs Starting date: 09/10/17 Today's weight : 199 lbs Today's date: 05/27/2018 Total lbs lost to date: 8 (Patients must lose 7 lbs in the first 6 months to continue with counseling)   ASK: We discussed the diagnosis of obesity with Rachael Camacho today and Rachael Camacho agreed to give us permission to discuss obesity behavioral modification therapy today.  ASSESS: Rachael Camacho has the diagnosis of obesity and her BMI today is 34.14 Rachael Camacho is in the action stage of change   ADVISE: Rachael Camacho was educated on the multiple health risks of obesity as well as the benefit of weight loss to improve her health. She was advised of the need for long term treatment and the importance of lifestyle modifications.  AGREE: Multiple dietary modification options and treatment options were discussed and  Rachael Camacho agreed to the above obesity treatment plan.  I, Burt KnackSharon Labra, am acting as transcriptionist for Quillian Quincearen Beasley, MD  I have reviewed the above documentation for accuracy and completeness, and I agree with the above. -Quillian Quincearen Beasley, MD

## 2018-05-28 ENCOUNTER — Other Ambulatory Visit: Payer: Self-pay | Admitting: Endocrinology

## 2018-05-28 DIAGNOSIS — E042 Nontoxic multinodular goiter: Secondary | ICD-10-CM

## 2018-06-23 ENCOUNTER — Ambulatory Visit
Admission: RE | Admit: 2018-06-23 | Discharge: 2018-06-23 | Disposition: A | Discharge status: Home / Self Care | Payer: BLUE CROSS/BLUE SHIELD | Source: Ambulatory Visit | Attending: Endocrinology | Admitting: Endocrinology

## 2018-06-23 DIAGNOSIS — E042 Nontoxic multinodular goiter: Secondary | ICD-10-CM

## 2018-06-25 ENCOUNTER — Ambulatory Visit (INDEPENDENT_AMBULATORY_CARE_PROVIDER_SITE_OTHER): Payer: BLUE CROSS/BLUE SHIELD | Admitting: Family Medicine

## 2018-06-25 VITALS — BP 96/64 | HR 67 | Temp 97.7°F | Ht 64.0 in | Wt 204.0 lb

## 2018-06-25 DIAGNOSIS — E559 Vitamin D deficiency, unspecified: Secondary | ICD-10-CM

## 2018-06-25 DIAGNOSIS — Z9189 Other specified personal risk factors, not elsewhere classified: Secondary | ICD-10-CM

## 2018-06-25 DIAGNOSIS — F3289 Other specified depressive episodes: Secondary | ICD-10-CM

## 2018-06-25 DIAGNOSIS — Z6835 Body mass index (BMI) 35.0-35.9, adult: Secondary | ICD-10-CM

## 2018-06-25 MED ORDER — BUPROPION HCL ER (SR) 200 MG PO TB12: 200.0000 mg | ORAL_TABLET | Freq: Every day | ORAL | 0 refills | Status: DC

## 2018-06-25 MED ORDER — VITAMIN D (ERGOCALCIFEROL) 1.25 MG (50000 UNIT) PO CAPS: 50000.0000 [IU] | ORAL_CAPSULE | ORAL | 1 refills | Status: DC

## 2018-06-25 NOTE — Progress Notes (Signed)
Office: [phone]  /  Fax: [phone]   HPI:   Chief Complaint: OBESITY Rachael Camacho is here to discuss her progress with her obesity treatment plan. She is on the keep a food journal with 1100-1300 calories and 75+ grams of protein daily and is following her eating plan approximately 0 % of the time. She states she is exercising 0 minutes 0 times per week. Rachael Camacho has been off track this summer with increased traveling and increased eating out. Going on vacation soon.  Her weight is 204 lb (92.5 kg) today and has gained 5 pounds since her last visit. She has lost 3 lbs since starting treatment with us.  Vitamin D Deficiency Rachael Camacho has a diagnosis of vitamin D deficiency. She is stable on prescription Vit D and denies nausea, vomiting or muscle weakness. She notes fatigue is improving.  At risk for osteopenia and osteoporosis Rachael Camacho is at higher risk of osteopenia and osteoporosis due to vitamin D deficiency.   Depression with emotional eating behaviors Rachael Camacho's mood is stable on Wellbutrin, still struggling with emotional eating and not planning ahead. She is using food for comfort to the extent that it is negatively impacting her health. She often snacks when she is not hungry. Rachael Camacho sometimes feels she is out of control and then feels guilty that she made poor food choices. She has been working on behavior modification techniques to help reduce her emotional eating and has been somewhat successful. She shows no sign of suicidal or homicidal ideations.  [facility] 09/10/2017 10/18/2016 07/18/2016 05/02/2016 03/01/2016  Decreased Interest 2 0 0 0 0  Down, Depressed, Hopeless 2 0 0 0 0  PHQ - 2 Score 4 0 0 0 0  Altered sleeping 1 - - - -  Tired, decreased energy 3 - - - -  Change in appetite 1 - - - -  Feeling bad or failure about yourself  3 - - - -  Trouble concentrating 2 - - - -  Moving slowly or fidgety/restless 2 - - - -  Suicidal thoughts 0 - - - -  PHQ-9 Score 16 -  - - -  Difficult doing work/chores Somewhat difficult - - - -    ALLERGIES: Allergies  Allergen Reactions  . Amoxicillin Other (See Comments)    Immediate yeast infection  Has patient had a PCN reaction causing immediate rash, facial/tongue/throat swelling, SOB or lightheadedness with hypotension: No Has patient had a PCN reaction causing severe rash involving mucus membranes or skin necrosis: No Has patient had a PCN reaction that required hospitalization: No Has patient had a PCN reaction occurring within the last 10 years: No If all of the above answers are "NO", then may proceed with Cephalosporin use.   [name]. Percocet [Oxycodone-Acetaminophen] Itching    Tolerates acetaminophen alone  . [name] [Nitrofurantoin Monohyd Macro] Rash and Hives  . [name] Hives and Rash    Reported chicken pox-like rash, not hives  . Sulfa Antibiotics Rash and Hives    MEDICATIONS: Current Outpatient Medications on File Prior to Visit  Medication Sig Dispense Refill  . baclofen (LIORESAL) 10 MG tablet Take 1 tablet (10 mg total) by mouth 3 (three) times daily. (Patient taking differently: Take 10 mg by mouth at bedtime. ) 90 each 11  . Biotin 5000 MCG CAPS Take 5,000 mcg by mouth daily.    . Cyanocobalamin (VITAMIN B-12) 5000 MCG SUBL Take 5,000 mcg by mouth daily.    [name]. desloratadine (CLARINEX) 5 MG tablet  Take 5 mg by mouth at bedtime.   12  . diclofenac sodium (VOLTAREN) 1 % GEL Apply 2-4 g topically 4 (four) times daily as needed (for knee pain.).    [name] fluticasone (FLONASE) 50 MCG/ACT nasal spray Place 1 spray into both nostrils at bedtime. Aller-Flo    . furosemide (LASIX) 20 MG tablet TAKE 1 TABLET (20 MG TOTAL) BY MOUTH DAILY. 90 tablet 3  . gabapentin (NEURONTIN) 100 MG capsule Take 1 capsule (100 mg total) by mouth 3 (three) times daily. (Patient taking differently: Take 100 mg by mouth at bedtime. ) 90 capsule 6  . levothyroxine (SYNTHROID, LEVOTHROID) 50 MCG tablet Take 25 mcg by mouth  daily before breakfast.   6  . Macitentan (OPSUMIT) 10 MG TABS Take 1 tablet (10 mg total) by mouth daily. 90 tablet 3  . Multiple Vitamin (MULTIVITAMIN WITH MINERALS) TABS tablet Take 1 tablet by mouth daily.    . Probiotic Product (PROBIOTIC PO) Take 1 capsule by mouth daily.     No current facility-administered medications on file prior to visit.     PAST MEDICAL HISTORY: Past Medical History:  Diagnosis Date  . Anemia    as child in first grade  . Anxiety   . Arthritis   . Back pain   . Constipation   . CREST syndrome (HCC)   . Dyspnea   . Family history of adverse reaction to anesthesia    mom has had problems in past - N/V post op on one occasion; difficult time waking up on another occasion  . Gallbladder problem   . GERD (gastroesophageal reflux disease)   . Gestational diabetes mellitus in childbirth, diet controlled 1987  . IBS (irritable bowel syndrome)   . Joint pain   . Leg edema   . Menopause   . Osteoarthritis (arthritis due to wear and tear of joints)   . PAH (pulmonary artery hypertension) (HCC)   . Pulmonary fibrosis (HCC)   . Raynaud disease    hands-toes  . Scleroderma (HCC)   . Shortness of breath dyspnea    pulmonary fibrosis-scleraderma  . Sleep apnea   . Swallowing difficulty   . Thyroid cyst   . Vitamin D deficiency   . Wears glasses     PAST SURGICAL HISTORY: Past Surgical History:  Procedure Laterality Date  . CARDIAC CATHETERIZATION N/A 11/25/2015   Procedure: Right Heart Cath;  Surgeon: Dolores Patty, MD;  [facility] INVASIVE CV LAB;  Service: Cardiovascular;  Laterality: N/A;  . CESAREAN SECTION  87,95  . CHOLECYSTECTOMY  1996   lap choli  . COLONOSCOPY    . ESOPHAGOGASTRODUODENOSCOPY (EGD) WITH PROPOFOL N/A 02/12/2017   Procedure: ESOPHAGOGASTRODUODENOSCOPY (EGD) WITH PROPOFOL;  Surgeon: Vida Rigger, MD;  Location: [facility] ENDOSCOPY;  Service: Endoscopy;  Laterality: N/A;  . HIATAL HERNIA REPAIR  07/26/2015   Procedure: LAPAROSCOPIC  REPAIR OF HIATAL HERNIA;  Surgeon: Luretha Murphy, MD;  Location: [facility] ORS;  Service: General;;  . LAPAROSCOPIC GASTRIC SLEEVE RESECTION N/A 07/26/2015   Procedure: LAPAROSCOPIC GASTRIC SLEEVE RESECTION;  Surgeon: Luretha Murphy, MD;  Location: [facility] ORS;  Service: General;  Laterality: N/A;  . LEFT AND RIGHT HEART CATHETERIZATION WITH CORONARY ANGIOGRAM N/A 03/14/2015   Procedure: LEFT AND RIGHT HEART CATHETERIZATION WITH CORONARY ANGIOGRAM;  Surgeon: Peter M Swaziland, MD;  Location[facility] CATH LAB;  Service: Cardiovascular;  Laterality: N/A;  . POSTERIOR CERVICAL FUSION/FORAMINOTOMY N/A 01/16/2018   Procedure: Cervical one-two Posterior instrumentation and fusion;  Surgeon: Tressie Stalker, MD;  Location: [facility] OR;  Service: Neurosurgery;  Laterality: N/A;  . RADIAL HEAD ARTHROPLASTY Right 07/28/2014   Procedure: RIGHT RADIAL HEAD REPLACEMENT;  Surgeon: Dairl Ponder, MD;  Location: [facility];  Service: Orthopedics;  Laterality: Right;  . RIGHT HEART CATH N/A 03/18/2017   Procedure: Right Heart Cath;  Surgeon: Dolores Patty, MD;  Location[facility] INVASIVE CV LAB;  Service: Cardiovascular;  Laterality: N/A;  . SAVORY DILATION N/A 02/12/2017   Procedure: SAVORY DILATION;  Surgeon: Vida Rigger, MD;  Location: [facility] ENDOSCOPY;  Service: Endoscopy;  Laterality: N/A;  . TUBAL LIGATION    . UPPER GI ENDOSCOPY  07/26/2015   Procedure: UPPER GI ENDOSCOPY;  Surgeon: Luretha Murphy, MD;  Location: [facility] ORS;  Service: General;;  . VAGINAL HYSTERECTOMY  2010   LAVH    SOCIAL HISTORY: Social History   Tobacco Use  . Smoking status: Never Smoker  . Smokeless tobacco: Never Used  Substance Use Topics  . Alcohol use: Yes    Alcohol/week: 0.0 oz    Comment: seldom  . Drug use: No    FAMILY HISTORY: Family History  Problem Relation Age of Onset  . Heart failure Mother        Stent put in 2014 for blockage  . COPD Mother   . Hypertension Mother   . Hyperlipidemia Mother   . Heart disease Mother   .  Thyroid disease Mother   . Cancer Mother   . Depression Mother   . Anxiety disorder Mother   . Heart disease Father   . Heart disease Son        Repair ASD  . Neuropathy Neg Hx     ROS: Review of Systems  Constitutional: Positive for malaise/fatigue. Negative for weight loss.  Gastrointestinal: Negative for nausea and vomiting.  Musculoskeletal:       Negative muscle weakness  Psychiatric/Behavioral: Positive for depression. Negative for suicidal ideas.    PHYSICAL EXAM: Blood pressure 96/64, pulse 67, temperature 97.7 F (36.5 C), temperature source Oral, height 5\' 4"  (1.626 m), weight 204 lb (92.5 kg), SpO2 98 %. Body mass index is 35.02 kg/m. Physical Exam  Constitutional: She is oriented to person, place, and time. She appears well-developed and well-nourished.  Cardiovascular: Normal rate.  Pulmonary/Chest: Effort normal.  Musculoskeletal: Normal range of motion.  Neurological: She is oriented to person, place, and time.  Skin: Skin is warm and dry.  Psychiatric: She has a normal mood and affect. Her behavior is normal.  Vitals reviewed.   RECENT LABS AND TESTS: BMET    Component Value Date/Time   NA 141 01/13/2018 1000   NA 143 12/10/2017 0955   K 3.6 01/13/2018 1000   CL 106 01/13/2018 1000   CO2 27 01/13/2018 1000   GLUCOSE 86 01/13/2018 1000   BUN 13 01/13/2018 1000   BUN 14 12/10/2017 0955   CREATININE 0.91 01/13/2018 1000   CREATININE 0.75 03/08/2015 1501   CALCIUM 9.2 01/13/2018 1000   GFRNONAA >60 01/13/2018 1000   GFRNONAA >89 03/08/2015 1501   GFRAA >60 01/13/2018 1000   GFRAA >89 03/08/2015 1501   Lab Results  Component Value Date   HGBA1C 4.9 12/10/2017   HGBA1C 5.0 09/10/2017   Lab Results  Component Value Date   INSULIN 4.4 12/10/2017   INSULIN 6.1 09/10/2017   CBC    Component Value Date/Time   WBC 6.2 01/13/2018 1000   RBC 4.43 01/13/2018 1000   HGB 13.5 01/13/2018 1000   HGB 13.9  09/10/2017 1108   HCT 40.7 01/13/2018 1000     HCT 41.6 09/10/2017 1108   PLT 220 01/13/2018 1000   MCV 91.9 01/13/2018 1000   MCV 90 09/10/2017 1108   MCH 30.5 01/13/2018 1000   MCHC 33.2 01/13/2018 1000   RDW 13.4 01/13/2018 1000   RDW 14.1 09/10/2017 1108   LYMPHSABS 2.3 09/10/2017 1108   MONOABS 0.9 07/28/2015 0455   EOSABS 0.2 09/10/2017 1108   BASOSABS 0.0 09/10/2017 1108   Iron/TIBC/Ferritin/ %Sat No results found for: IRON, TIBC, FERRITIN, IRONPCTSAT Lipid Panel     Component Value Date/Time   CHOL 151 09/10/2017 1108   TRIG 102 09/10/2017 1108   HDL 70 09/10/2017 1108   LDLCALC 61 09/10/2017 1108   Hepatic Function Panel     Component Value Date/Time   PROT 6.3 12/10/2017 0955   ALBUMIN 4.2 12/10/2017 0955   AST 18 12/10/2017 0955   ALT 15 12/10/2017 0955   ALKPHOS 85 12/10/2017 0955   BILITOT 0.5 12/10/2017 0955      Component Value Date/Time   TSH 2.690 09/10/2017 1108    ASSESSMENT AND PLAN: Vitamin D deficiency - Plan: Vitamin D, Ergocalciferol, (DRISDOL) 50000 units CAPS capsule  Other depression - with emotional eating - Plan: buPROPion (WELLBUTRIN SR) 200 MG 12 hr tablet  At risk for osteoporosis  Class 2 severe obesity with serious comorbidity and body mass index (BMI) of 35.0 to 35.9 in adult, unspecified obesity type (HCC)  PLAN:  Vitamin D Deficiency Cassaundra was informed that low vitamin D levels contributes to fatigue and are associated with obesity, breast, and colon cancer. Tenae agrees to continue taking prescription Vit D @50 ,000 IU every week #4 and we will refill for 1 month. She will follow up for routine testing of vitamin D, at least 2-3 times per year. She was informed of the risk of over-replacement of vitamin D and agrees to not increase her dose unless she discusses this with [place] first. We will check labs and Jezabelle agrees to follow up with our clinic in 3 to 4 weeks.  At risk for osteopenia and osteoporosis Brynlee is at risk for osteopenia and osteoporsis due to her vitamin  D deficiency. She was encouraged to take her vitamin D and follow her higher calcium diet and increase strengthening exercise to help strengthen her bones and decrease her risk of osteopenia and osteoporosis.  Depression with Emotional Eating Behaviors We discussed behavior modification techniques today to help Mikiya deal with her emotional eating and depression. Erandy agrees to continue taking Wellbutrin SR 200 mg qd #30 and we will refill for 1 month. We discussed cognitive behavioral therapy to get back on track after vacation. Zahrah agrees to follow up with our clinic in 3 to 4 weeks.  Obesity Yzabella is currently in the action stage of change. As such, her goal is to continue with weight loss efforts She has agreed to portion control better and make smarter food choices, such as increase vegetables and decrease simple carbohydrates  over vacation Pattiann has been instructed to work up to a goal of 150 minutes of combined cardio and strengthening exercise per week for weight loss and overall health benefits. We discussed the following Behavioral Modification Strategies today: increasing lean protein intake, decreasing simple carbohydrates, holiday eating strategies, and travel eating strategies   Havyn has agreed to follow up with our clinic in 3 to 4 weeks. She was informed of the importance of frequent follow up visits to maximize  her success with intensive lifestyle modifications for her multiple health conditions.   OBESITY BEHAVIORAL INTERVENTION VISIT  Today's visit was # 14 out of 22.  Starting weight: 207 lbs Starting date: 09/10/17 Today's weight : 204 lbs Today's date: 06/25/2018 Total lbs lost to date: 3 (Patients must lose 7 lbs in the first 6 months to continue with counseling)   ASK: We discussed the diagnosis of obesity with Burley Saver today and Danyel agreed to give [place] permission to discuss obesity behavioral modification therapy today.  ASSESS: Dotty has the  diagnosis of obesity and her BMI today is 28 Chandel is in the action stage of change   ADVISE: Zaeda was educated on the multiple health risks of obesity as well as the benefit of weight loss to improve her health. She was advised of the need for long term treatment and the importance of lifestyle modifications.  AGREE: Multiple dietary modification options and treatment options were discussed and  Sharronda agreed to the above obesity treatment plan.  I, Burt Knack, am acting as transcriptionist for Quillian Quince, MD  I have reviewed the above documentation for accuracy and completeness, and I agree with the above. -Quillian Quince, MD

## 2018-06-26 LAB — COMPREHENSIVE METABOLIC PANEL
ALBUMIN: 4 g/dL (ref 3.5–5.5)
ALK PHOS: 87 IU/L (ref 39–117)
ALT: 12 IU/L (ref 0–32)
AST: 13 IU/L (ref 0–40)
Albumin/Globulin Ratio: 1.8 (ref 1.2–2.2)
BILIRUBIN TOTAL: 0.4 mg/dL (ref 0.0–1.2)
BUN / CREAT RATIO: 13 (ref 9–23)
BUN: 12 mg/dL (ref 6–24)
CHLORIDE: 108 mmol/L — AB (ref 96–106)
CO2: 25 mmol/L (ref 20–29)
Calcium: 9.2 mg/dL (ref 8.7–10.2)
Creatinine, Ser: 0.89 mg/dL (ref 0.57–1.00)
GFR calc Af Amer: 86 mL/min/{1.73_m2} (ref >59)
GFR calc non Af Amer: 74 mL/min/{1.73_m2} (ref >59)
GLOBULIN, TOTAL: 2.2 g/dL (ref 1.5–4.5)
Glucose: 78 mg/dL (ref 65–99)
POTASSIUM: 4.2 mmol/L (ref 3.5–5.2)
SODIUM: 147 mmol/L — AB (ref 134–144)
Total Protein: 6.2 g/dL (ref 6.0–8.5)

## 2018-06-26 LAB — LIPID PANEL WITH LDL/HDL RATIO
Cholesterol, Total: 159 mg/dL (ref 100–199)
HDL: 73 mg/dL (ref >39)
LDL CALC: 66 mg/dL (ref 0–99)
LDL/HDL RATIO: 0.9 ratio (ref 0.0–3.2)
Triglycerides: 100 mg/dL (ref 0–149)
VLDL Cholesterol Cal: 20 mg/dL (ref 5–40)

## 2018-06-26 LAB — HEMOGLOBIN A1C
Est. average glucose Bld gHb Est-mCnc: 94 mg/dL
Hgb A1c MFr Bld: 4.9 % (ref 4.8–5.6)

## 2018-06-26 LAB — INSULIN, RANDOM: INSULIN: 8.2 u[IU]/mL (ref 2.6–24.9)

## 2018-06-26 LAB — VITAMIN D 25 HYDROXY (VIT D DEFICIENCY, FRACTURES): VIT D 25 HYDROXY: 60 ng/mL (ref 30.0–100.0)

## 2018-07-21 ENCOUNTER — Ambulatory Visit (INDEPENDENT_AMBULATORY_CARE_PROVIDER_SITE_OTHER): Payer: BLUE CROSS/BLUE SHIELD | Admitting: Family Medicine

## 2018-07-21 VITALS — BP 121/81 | HR 75 | Ht 64.0 in | Wt 204.0 lb

## 2018-07-21 DIAGNOSIS — Z6835 Body mass index (BMI) 35.0-35.9, adult: Secondary | ICD-10-CM

## 2018-07-21 DIAGNOSIS — F3289 Other specified depressive episodes: Secondary | ICD-10-CM

## 2018-07-21 DIAGNOSIS — Z9189 Other specified personal risk factors, not elsewhere classified: Secondary | ICD-10-CM | POA: Diagnosis not present

## 2018-07-21 DIAGNOSIS — E559 Vitamin D deficiency, unspecified: Secondary | ICD-10-CM | POA: Diagnosis not present

## 2018-07-21 MED ORDER — BUPROPION HCL ER (SR) 200 MG PO TB12: 200.0000 mg | ORAL_TABLET | Freq: Every day | ORAL | 0 refills | Status: DC

## 2018-07-21 MED ORDER — VITAMIN D (ERGOCALCIFEROL) 1.25 MG (50000 UNIT) PO CAPS: 50000.0000 [IU] | ORAL_CAPSULE | ORAL | 0 refills | Status: DC

## 2018-07-22 ENCOUNTER — Encounter: Payer: Self-pay | Admitting: Neurology

## 2018-07-22 NOTE — Progress Notes (Signed)
Office: [phone]  /  Fax: [phone]   HPI:   Chief Complaint: OBESITY Rachael Camacho is here to discuss her progress with her obesity treatment plan. She is on the portion control better and make smarter food choices, such as increase vegetables and decrease simple carbohydrates and is following her eating plan approximately 50 % of the time. She states she did a lot of walking on vacation. Bernie did well with maintaining weight even on vacation in the [place]. She is ready to get back to a structured meal plan.  Her weight is 204 lb (92.5 kg) today and has not lost weight since her last visit. She has lost 3 lbs since starting treatment with [place].  Vitamin D Deficiency Rachael Camacho has a diagnosis of vitamin D deficiency. She is stable on prescription Vit D and denies nausea, vomiting or muscle weakness.  At risk for osteopenia and osteoporosis Rachael Camacho is at higher risk of osteopenia and osteoporosis due to vitamin D deficiency.   Depression with emotional eating behaviors Rachael Camacho's mood is stable on Wellbutrin, she denies insomnia and blood pressure is stable. Rachael Camacho struggles with emotional eating and using food for comfort to the extent that it is negatively impacting her health. She often snacks when she is not hungry. Rachael Camacho sometimes feels she is out of control and then feels guilty that she made poor food choices. She has been working on behavior modification techniques to help reduce her emotional eating and has been somewhat successful. She shows no sign of suicidal or homicidal ideations.  Depression screen [facility] 2/9 09/10/2017 10/18/2016 07/18/2016 05/02/2016 03/01/2016  Decreased Interest 2 0 0 0 0  Down, Depressed, Hopeless 2 0 0 0 0  PHQ - 2 Score 4 0 0 0 0  Altered sleeping 1 - - - -  Tired, decreased energy 3 - - - -  Change in appetite 1 - - - -  Feeling bad or failure about yourself  3 - - - -  Trouble concentrating 2 - - - -  Moving slowly or fidgety/restless 2 - - - -    Suicidal thoughts 0 - - - -  PHQ-9 Score 16 - - - -  Difficult doing work/chores Somewhat difficult - - - -    ALLERGIES: Allergies  Allergen Reactions  . Amoxicillin Other (See Comments)    Immediate yeast infection  Has patient had a PCN reaction causing immediate rash, facial/tongue/throat swelling, SOB or lightheadedness with hypotension: No Has patient had a PCN reaction causing severe rash involving mucus membranes or skin necrosis: No Has patient had a PCN reaction that required hospitalization: No Has patient had a PCN reaction occurring within the last 10 years: No If all of the above answers are "NO", then may proceed with Cephalosporin use.   [name] Percocet [Oxycodone-Acetaminophen] Itching    Tolerates acetaminophen alone  . [name] [Nitrofurantoin Monohyd Macro] Rash and Hives  . [name] Hives and Rash    Reported chicken pox-like rash, not hives  . Sulfa Antibiotics Rash and Hives    MEDICATIONS: Current Outpatient Medications on File Prior to Visit  Medication Sig Dispense Refill  . baclofen (LIORESAL) 10 MG tablet Take 1 tablet (10 mg total) by mouth 3 (three) times daily. (Patient taking differently: Take 10 mg by mouth at bedtime. ) 90 each 11  . Biotin 5000 MCG CAPS Take 5,000 mcg by mouth daily.    . Cyanocobalamin (VITAMIN B-12) 5000 MCG SUBL Take 5,000 mcg by mouth daily.    [name]  desloratadine (CLARINEX) 5 MG tablet Take 5 mg by mouth at bedtime.   12  . diclofenac sodium (VOLTAREN) 1 % GEL Apply 2-4 g topically 4 (four) times daily as needed (for knee pain.).    [name] fluticasone (FLONASE) 50 MCG/ACT nasal spray Place 1 spray into both nostrils at bedtime. Aller-Flo    . furosemide (LASIX) 20 MG tablet TAKE 1 TABLET (20 MG TOTAL) BY MOUTH DAILY. 90 tablet 3  . gabapentin (NEURONTIN) 100 MG capsule Take 1 capsule (100 mg total) by mouth 3 (three) times daily. (Patient taking differently: Take 100 mg by mouth at bedtime. ) 90 capsule 6  . levothyroxine (SYNTHROID,  LEVOTHROID) 50 MCG tablet Take 25 mcg by mouth daily before breakfast.   6  . Macitentan (OPSUMIT) 10 MG TABS Take 1 tablet (10 mg total) by mouth daily. 90 tablet 3  . Multiple Vitamin (MULTIVITAMIN WITH MINERALS) TABS tablet Take 1 tablet by mouth daily.    . Probiotic Product (PROBIOTIC PO) Take 1 capsule by mouth daily.     No current facility-administered medications on file prior to visit.     PAST MEDICAL HISTORY: Past Medical History:  Diagnosis Date  . Anemia    as child in first grade  . Anxiety   . Arthritis   . Back pain   . Constipation   . CREST syndrome (HCC)   . Dyspnea   . Family history of adverse reaction to anesthesia    mom has had problems in past - N/V post op on one occasion; difficult time waking up on another occasion  . Gallbladder problem   . GERD (gastroesophageal reflux disease)   . Gestational diabetes mellitus in childbirth, diet controlled 1987  . IBS (irritable bowel syndrome)   . Joint pain   . Leg edema   . Menopause   . Osteoarthritis (arthritis due to wear and tear of joints)   . PAH (pulmonary artery hypertension) (HCC)   . Pulmonary fibrosis (HCC)   . Raynaud disease    hands-toes  . Scleroderma (HCC)   . Shortness of breath dyspnea    pulmonary fibrosis-scleraderma  . Sleep apnea   . Swallowing difficulty   . Thyroid cyst   . Vitamin D deficiency   . Wears glasses     PAST SURGICAL HISTORY: Past Surgical History:  Procedure Laterality Date  . CARDIAC CATHETERIZATION N/A 11/25/2015   Procedure: Right Heart Cath;  Surgeon: Dolores Patty, MD;  Location: [facility] INVASIVE CV LAB;  Service: Cardiovascular;  Laterality: N/A;  . CESAREAN SECTION  87,95  . CHOLECYSTECTOMY  1996   lap choli  . COLONOSCOPY    . ESOPHAGOGASTRODUODENOSCOPY (EGD) WITH PROPOFOL N/A 02/12/2017   Procedure: ESOPHAGOGASTRODUODENOSCOPY (EGD) WITH PROPOFOL;  Surgeon: Vida Rigger, MD;  Location: [facility] ENDOSCOPY;  Service: Endoscopy;  Laterality: N/A;  . HIATAL  HERNIA REPAIR  07/26/2015   Procedure: LAPAROSCOPIC REPAIR OF HIATAL HERNIA;  Surgeon: Luretha Murphy, MD;  Location: [facility] ORS;  Service: General;;  . LAPAROSCOPIC GASTRIC SLEEVE RESECTION N/A 07/26/2015   Procedure: LAPAROSCOPIC GASTRIC SLEEVE RESECTION;  Surgeon: Luretha Murphy, MD;  Location: [facility] ORS;  Service: General;  Laterality: N/A;  . LEFT AND RIGHT HEART CATHETERIZATION WITH CORONARY ANGIOGRAM N/A 03/14/2015   Procedure: LEFT AND RIGHT HEART CATHETERIZATION WITH CORONARY ANGIOGRAM;  Surgeon: Peter M Swaziland, MD;  Location: [facility] CATH LAB;  Service: Cardiovascular;  Laterality: N/A;  . POSTERIOR CERVICAL FUSION/FORAMINOTOMY N/A 01/16/2018   Procedure: Cervical one-two Posterior instrumentation and fusion;  Surgeon: Tressie Stalker, MD;  Location: [facility] OR;  Service: Neurosurgery;  Laterality: N/A;  . RADIAL HEAD ARTHROPLASTY Right 07/28/2014   Procedure: RIGHT RADIAL HEAD REPLACEMENT;  Surgeon: Dairl Ponder, MD;  Location: [facility];  Service: Orthopedics;  Laterality: Right;  . RIGHT HEART CATH N/A 03/18/2017   Procedure: Right Heart Cath;  Surgeon: Dolores Patty, MD;  Location: [facility] INVASIVE CV LAB;  Service: Cardiovascular;  Laterality: N/A;  . SAVORY DILATION N/A 02/12/2017   Procedure: SAVORY DILATION;  Surgeon: Vida Rigger, MD;  Location: [facility]OS[facility];  Service: Endoscopy;  Laterality: N/A;  . TUBAL LIGATION    . UPPER GI ENDOSCOPY  07/26/2015   Procedure: UPPER GI ENDOSCOPY;  Surgeon: Luretha Murphy, MD;  Location: [facility] ORS;  Service: General;;  . VAGINAL HYSTERECTOMY  2010   LAVH    SOCIAL HISTORY: Social History   Tobacco Use  . Smoking status: Never Smoker  . Smokeless tobacco: Never Used  Substance Use Topics  . Alcohol use: Yes    Alcohol/week: 0.0 oz    Comment: seldom  . Drug use: No    FAMILY HISTORY: Family History  Problem Relation Age of Onset  . Heart failure Mother        Stent put in 2014 for blockage  . COPD Mother   . Hypertension Mother   .  Hyperlipidemia Mother   . Heart disease Mother   . Thyroid disease Mother   . Cancer Mother   . Depression Mother   . Anxiety disorder Mother   . Heart disease Father   . Heart disease Son        Repair ASD  . Neuropathy Neg Hx     ROS: Review of Systems  Constitutional: Negative for weight loss.  Gastrointestinal: Negative for nausea and vomiting.  Musculoskeletal:       Negative muscle weakness  Psychiatric/Behavioral: Positive for depression. Negative for suicidal ideas. The patient does not have insomnia.     PHYSICAL EXAM: Blood pressure 121/81, pulse 75, height 5\' 4"  (1.626 m), weight 204 lb (92.5 kg), SpO2 97 %. Body mass index is 35.02 kg/m. Physical Exam  Constitutional: She appears well-developed and well-nourished.  Cardiovascular: Normal rate.  Pulmonary/Chest: Effort normal.  Musculoskeletal: Normal range of motion.  Skin: Skin is warm and dry.  Psychiatric: She has a normal mood and affect. Her behavior is normal.  Vitals reviewed.   RECENT LABS AND TESTS: BMET    Component Value Date/Time   NA 147 (H) 06/25/2018 1039   K 4.2 06/25/2018 1039   CL 108 (H) 06/25/2018 1039   CO2 25 06/25/2018 1039   GLUCOSE 78 06/25/2018 1039   GLUCOSE 86 01/13/2018 1000   BUN 12 06/25/2018 1039   CREATININE 0.89 06/25/2018 1039   CREATININE 0.75 03/08/2015 1501   CALCIUM 9.2 06/25/2018 1039   GFRNONAA 74 06/25/2018 1039   GFRNONAA >89 03/08/2015 1501   GFRAA 86 06/25/2018 1039   GFRAA >89 03/08/2015 1501   Lab Results  Component Value Date   HGBA1C 4.9 06/25/2018   HGBA1C 4.9 12/10/2017   HGBA1C 5.0 09/10/2017   Lab Results  Component Value Date   INSULIN 8.2 06/25/2018   INSULIN 4.4 12/10/2017   INSULIN 6.1 09/10/2017   CBC    Component Value Date/Time   WBC 6.2 01/13/2018 1000   RBC 4.43 01/13/2018 1000   HGB 13.5 01/13/2018 1000   HGB 13.9 09/10/2017 1108   HCT 40.7 01/13/2018 1000   HCT  41.6 09/10/2017 1108   PLT 220 01/13/2018 1000   MCV  91.9 01/13/2018 1000   MCV 90 09/10/2017 1108   MCH 30.5 01/13/2018 1000   MCHC 33.2 01/13/2018 1000   RDW 13.4 01/13/2018 1000   RDW 14.1 09/10/2017 1108   LYMPHSABS 2.3 09/10/2017 1108   MONOABS 0.9 07/28/2015 0455   EOSABS 0.2 09/10/2017 1108   BASOSABS 0.0 09/10/2017 1108   Iron/TIBC/Ferritin/ %Sat No results found for: IRON, TIBC, FERRITIN, IRONPCTSAT Lipid Panel     Component Value Date/Time   CHOL 159 06/25/2018 1039   TRIG 100 06/25/2018 1039   HDL 73 06/25/2018 1039   LDLCALC 66 06/25/2018 1039   Hepatic Function Panel     Component Value Date/Time   PROT 6.2 06/25/2018 1039   ALBUMIN 4.0 06/25/2018 1039   AST 13 06/25/2018 1039   ALT 12 06/25/2018 1039   ALKPHOS 87 06/25/2018 1039   BILITOT 0.4 06/25/2018 1039      Component Value Date/Time   TSH 2.690 09/10/2017 1108  Results for MAMIE, HUNDERTMARK (MRN [id number]) as of 07/22/2018 14:55  Ref. Range 06/25/2018 10:39  Vitamin D, 25-Hydroxy Latest Ref Range: 30.0 - 100.0 ng/mL 60.0    ASSESSMENT AND PLAN: Vitamin D deficiency - Plan: Vitamin D, Ergocalciferol, (DRISDOL) 50000 units CAPS capsule  Other depression - with emotional eating - Plan: buPROPion (WELLBUTRIN SR) 200 MG 12 hr tablet  At risk for osteoporosis  Class 2 severe obesity with serious comorbidity and body mass index (BMI) of 35.0 to 35.9 in adult, unspecified obesity type (HCC)  PLAN:  Vitamin D Deficiency Aitana was informed that low vitamin D levels contributes to fatigue and are associated with obesity, breast, and colon cancer. Rachael Camacho agrees to continue taking prescription Vit D @50 ,000 IU every week #4 and we will refill for 1 month. She will follow up for routine testing of vitamin D, at least 2-3 times per year. She was informed of the risk of over-replacement of vitamin D and agrees to not increase her dose unless she discusses this with [place] first. Rachael Camacho agrees to follow up with our clinic in 2 to 3 weeks.  At risk for osteopenia and  osteoporosis Rachael Camacho is at risk for osteopenia and osteoporsis due to her vitamin D deficiency. She was encouraged to take her vitamin D and follow her higher calcium diet and increase strengthening exercise to help strengthen her bones and decrease her risk of osteopenia and osteoporosis.  Depression with Emotional Eating Behaviors We discussed behavior modification techniques today to help Rachael Camacho deal with her emotional eating and depression. Rachael Camacho agrees to continue taking Wellbutrin SR 200 mg qd #30 and we will refill for 1 month. Rachael Camacho agrees to follow up with our clinic in 2 to 3 weeks.  Obesity Rachael Camacho is currently in the action stage of change. As such, her goal is to continue with weight loss efforts She has agreed to keep a food journal with 1100-1300 calories and 75 grams of protein daily Rachael Camacho has been instructed to work up to a goal of 150 minutes of combined cardio and strengthening exercise per week or physical therapy 2-3 times per week for weight loss and overall health benefits. We discussed the following Behavioral Modification Strategies today: increasing lean protein intake, increasing vegetables, and keep a strict food journal   Rachael Camacho has agreed to follow up with our clinic in 2 to 3 weeks. She was informed of the importance of frequent follow up visits to maximize her  success with intensive lifestyle modifications for her multiple health conditions.   OBESITY BEHAVIORAL INTERVENTION VISIT  Today's visit was # 15 out of 22.  Starting weight: 207 lbs Starting date: 09/10/17 Today's weight : 204 lbs  Today's date: 07/21/2018 Total lbs lost to date: 3    ASK: We discussed the diagnosis of obesity with Rachael Camacho D Dimichele today and Rachael Camacho agreed to give us permission to discuss obesity behavioral modification therapy today.  ASSESS: Rachael Camacho has the diagnosis of obesity and her BMI today is 2035 Rachael Camacho is in the action stage of change   ADVISE: Rachael Camacho was educated on  the multiple health risks of obesity as well as the benefit of weight loss to improve her health. She was advised of the need for long term treatment and the importance of lifestyle modifications.  AGREE: Multiple dietary modification options and treatment options were discussed and  Rachael Camacho agreed to the above obesity treatment plan.  I, Burt KnackSharon Paolo, am acting as transcriptionist for Quillian Quincearen Arayna Illescas, MD  I have reviewed the above documentation for accuracy and completeness, and I agree with the above. -Quillian Quincearen Elyon Zoll, MD

## 2018-07-30 ENCOUNTER — Other Ambulatory Visit (HOSPITAL_COMMUNITY): Payer: Self-pay | Admitting: *Deleted

## 2018-07-30 MED ORDER — MACITENTAN 10 MG PO TABS: 10.0000 mg | ORAL_TABLET | Freq: Every day | ORAL | 3 refills | Status: DC

## 2018-08-07 ENCOUNTER — Other Ambulatory Visit (HOSPITAL_COMMUNITY): Payer: Self-pay | Admitting: Internal Medicine

## 2018-08-11 ENCOUNTER — Ambulatory Visit (INDEPENDENT_AMBULATORY_CARE_PROVIDER_SITE_OTHER): Payer: BLUE CROSS/BLUE SHIELD | Admitting: Family Medicine

## 2018-08-11 VITALS — BP 100/68 | HR 75 | Temp 97.8°F | Ht 64.0 in | Wt 205.0 lb

## 2018-08-11 DIAGNOSIS — Z9189 Other specified personal risk factors, not elsewhere classified: Secondary | ICD-10-CM

## 2018-08-11 DIAGNOSIS — F3289 Other specified depressive episodes: Secondary | ICD-10-CM

## 2018-08-11 DIAGNOSIS — Z6835 Body mass index (BMI) 35.0-35.9, adult: Secondary | ICD-10-CM

## 2018-08-11 DIAGNOSIS — E559 Vitamin D deficiency, unspecified: Secondary | ICD-10-CM | POA: Diagnosis not present

## 2018-08-11 MED ORDER — BUPROPION HCL ER (SR) 200 MG PO TB12: 200.0000 mg | ORAL_TABLET | Freq: Every day | ORAL | 0 refills | Status: DC

## 2018-08-11 MED ORDER — VITAMIN D (ERGOCALCIFEROL) 1.25 MG (50000 UNIT) PO CAPS: 50000.0000 [IU] | ORAL_CAPSULE | ORAL | 0 refills | Status: DC

## 2018-08-12 NOTE — Progress Notes (Signed)
Office: [phone]  /  Fax: [phone]   HPI:   Chief Complaint: OBESITY Rachael Camacho is here to discuss her progress with her obesity treatment plan. She is on the  keep a food journal with 1100 to 1300 calories and 75 grams of protein daily and is following her eating plan approximately 0 % of the time. She states she is exercising 0 minutes 0 times per week. Rachael Camacho has been struggling to stay on track over the last year. We had an in depth discussion about patient's readiness to change and she admits, that she is not in the action stage of change. Her weight is 205 lb (93 kg) today and has not lost weight since her last visit. She has lost 2 lbs since starting treatment with [place].  Vitamin D deficiency Rachael Camacho has a diagnosis of vitamin D deficiency. She is at goal on vitamin D. Fatigue is unchanged and she denies nausea, vomiting or muscle weakness.  Depression with emotional eating behaviors Rachael Camacho is struggling with emotional eating and using food for comfort to the extent that it is negatively impacting her health. She often snacks when she is not hungry. Rachael Camacho sometimes feels she is out of control and then feels guilty that she made poor food choices. She has been working on behavior modification techniques to help reduce her emotional eating and has been somewhat successful. Her mood is stable on Wellbutrin and she shows no sign of suicidal or homicidal ideations.  At risk for cardiovascular disease Rachael Camacho is at a higher than average risk for cardiovascular disease due to obesity. She currently denies any chest pain.  Depression screen Rachael Camacho 2/9 09/10/2017 10/18/2016 07/18/2016 05/02/2016 03/01/2016  Decreased Interest 2 0 0 0 0  Down, Depressed, Hopeless 2 0 0 0 0  PHQ - 2 Score 4 0 0 0 0  Altered sleeping 1 - - - -  Tired, decreased energy 3 - - - -  Change in appetite 1 - - - -  Feeling bad or failure about yourself  3 - - - -  Trouble concentrating 2 - - - -  Moving slowly or  fidgety/restless 2 - - - -  Suicidal thoughts 0 - - - -  PHQ-9 Score 16 - - - -  Difficult doing work/chores Somewhat difficult - - - -      ALLERGIES: Allergies  Allergen Reactions  . Amoxicillin Other (See Comments)    Immediate yeast infection  Has patient had a PCN reaction causing immediate rash, facial/tongue/throat swelling, SOB or lightheadedness with hypotension: No Has patient had a PCN reaction causing severe rash involving mucus membranes or skin necrosis: No Has patient had a PCN reaction that required hospitalization: No Has patient had a PCN reaction occurring within the last 10 years: No If all of the above answers are "NO", then may proceed with Cephalosporin use.   [name] Percocet [Oxycodone-Acetaminophen] Itching    Tolerates acetaminophen alone  . [name] [Nitrofurantoin Monohyd Macro] Rash and Hives  . [name] Hives and Rash    Reported chicken pox-like rash, not hives  . Sulfa Antibiotics Rash and Hives    MEDICATIONS: Current Outpatient Medications on File Prior to Visit  Medication Sig Dispense Refill  . baclofen (LIORESAL) 10 MG tablet Take 1 tablet (10 mg total) by mouth 3 (three) times daily. (Patient taking differently: Take 10 mg by mouth at bedtime. ) 90 each 11  . Biotin 5000 MCG CAPS Take 5,000 mcg by mouth daily.    [name]  Cyanocobalamin (VITAMIN B-12) 5000 MCG SUBL Take 5,000 mcg by mouth daily.    [name] desloratadine (CLARINEX) 5 MG tablet Take 5 mg by mouth at bedtime.   12  . diclofenac sodium (VOLTAREN) 1 % GEL Apply 2-4 g topically 4 (four) times daily as needed (for knee pain.).    [name] fluticasone (FLONASE) 50 MCG/ACT nasal spray Place 1 spray into both nostrils at bedtime. Aller-Flo    . furosemide (LASIX) 20 MG tablet TAKE 1 TABLET (20 MG TOTAL) BY MOUTH DAILY. 90 tablet 3  . gabapentin (NEURONTIN) 100 MG capsule Take 1 capsule (100 mg total) by mouth 3 (three) times daily. (Patient taking differently: Take 100 mg by mouth at bedtime. ) 90 capsule 6   . levothyroxine (SYNTHROID, LEVOTHROID) 50 MCG tablet Take 25 mcg by mouth daily before breakfast.   6  . macitentan (OPSUMIT) 10 MG tablet Take 1 tablet (10 mg total) by mouth daily. NEEDS FOLLOW UP APPOINTMENT 90 tablet 3  . Multiple Vitamin (MULTIVITAMIN WITH MINERALS) TABS tablet Take 1 tablet by mouth daily.    . Probiotic Product (PROBIOTIC PO) Take 1 capsule by mouth daily.     No current facility-administered medications on file prior to visit.     PAST MEDICAL HISTORY: Past Medical History:  Diagnosis Date  . Anemia    as child in first grade  . Anxiety   . Arthritis   . Back pain   . Constipation   . CREST syndrome (HCC)   . Dyspnea   . Family history of adverse reaction to anesthesia    mom has had problems in past - N/V post op on one occasion; difficult time waking up on another occasion  . Gallbladder problem   . GERD (gastroesophageal reflux disease)   . Gestational diabetes mellitus in childbirth, diet controlled 1987  . IBS (irritable bowel syndrome)   . Joint pain   . Leg edema   . Menopause   . Osteoarthritis (arthritis due to wear and tear of joints)   . PAH (pulmonary artery hypertension) (HCC)   . Pulmonary fibrosis (HCC)   . Raynaud disease    hands-toes  . Scleroderma (HCC)   . Shortness of breath dyspnea    pulmonary fibrosis-scleraderma  . Sleep apnea   . Swallowing difficulty   . Thyroid cyst   . Vitamin D deficiency   . Wears glasses     PAST SURGICAL HISTORY: Past Surgical History:  Procedure Laterality Date  . CARDIAC CATHETERIZATION N/A 11/25/2015   Procedure: Right Heart Cath;  Surgeon: Dolores Patty, MD;  Location: [facility] INVASIVE CV LAB;  Service: Cardiovascular;  Laterality: N/A;  . CESAREAN SECTION  87,95  . CHOLECYSTECTOMY  1996   lap choli  . COLONOSCOPY    . ESOPHAGOGASTRODUODENOSCOPY (EGD) WITH PROPOFOL N/A 02/12/2017   Procedure: ESOPHAGOGASTRODUODENOSCOPY (EGD) WITH PROPOFOL;  Surgeon: Vida Rigger, MD;  Location: [facility]  ENDOSCOPY;  Service: Endoscopy;  Laterality: N/A;  . HIATAL HERNIA REPAIR  07/26/2015   Procedure: LAPAROSCOPIC REPAIR OF HIATAL HERNIA;  Surgeon: Luretha Murphy, MD;  Location: [facility] ORS;  Service: General;;  . LAPAROSCOPIC GASTRIC SLEEVE RESECTION N/A 07/26/2015   Procedure: LAPAROSCOPIC GASTRIC SLEEVE RESECTION;  Surgeon: Luretha Murphy, MD;  Location: [facility] ORS;  Service: General;  Laterality: N/A;  . LEFT AND RIGHT HEART CATHETERIZATION WITH CORONARY ANGIOGRAM N/A 03/14/2015   Procedure: LEFT AND RIGHT HEART CATHETERIZATION WITH CORONARY ANGIOGRAM;  Surgeon: Peter M Swaziland, MD;  Location: [facility] CATH LAB;  Service: Cardiovascular;  Laterality: N/A;  . POSTERIOR CERVICAL FUSION/FORAMINOTOMY N/A 01/16/2018   Procedure: Cervical one-two Posterior instrumentation and fusion;  Surgeon: Tressie Stalker, MD;  Location[facility] OR;  Service: Neurosurgery;  Laterality: N/A;  . RADIAL HEAD ARTHROPLASTY Right 07/28/2014   Procedure: RIGHT RADIAL HEAD REPLACEMENT;  Surgeon: Dairl Ponder, MD;  Location: [facility];  Service: Orthopedics;  Laterality: Right;  . RIGHT HEART CATH N/A 03/18/2017   Procedure: Right Heart Cath;  Surgeon: Dolores Patty, MD;  Location: [facility] INVASIVE CV LAB;  Service: Cardiovascular;  Laterality: N/A;  . SAVORY DILATION N/A 02/12/2017   Procedure: SAVORY DILATION;  Surgeon: Vida Rigger, MD;  Location: [facility] ENDOSCOPY;  Service: Endoscopy;  Laterality: N/A;  . TUBAL LIGATION    . UPPER GI ENDOSCOPY  07/26/2015   Procedure: UPPER GI ENDOSCOPY;  Surgeon: Luretha Murphy, MD;  Location: [facility] ORS;  Service: General;;  . VAGINAL HYSTERECTOMY  2010   LA[facility]    SOCIAL HISTORY: Social History   Tobacco Use  . Smoking status: Never Smoker  . Smokeless tobacco: Never Used  Substance Use Topics  . Alcohol use: Yes    Alcohol/week: 0.0 standard drinks    Comment: seldom  . Drug use: No    FAMILY HISTORY: Family History  Problem Relation Age of Onset  . Heart failure Mother        Stent  put in 2014 for blockage  . COPD Mother   . Hypertension Mother   . Hyperlipidemia Mother   . Heart disease Mother   . Thyroid disease Mother   . Cancer Mother   . Depression Mother   . Anxiety disorder Mother   . Heart disease Father   . Heart disease Son        Repair ASD  . Neuropathy Neg Hx     ROS: Review of Systems  Constitutional: Positive for malaise/fatigue. Negative for weight loss.  Cardiovascular: Negative for chest pain.  Gastrointestinal: Negative for nausea and vomiting.  Musculoskeletal:       Negative for muscle weakness  Psychiatric/Behavioral: Positive for depression. Negative for suicidal ideas.    PHYSICAL EXAM: Blood pressure 100/68, pulse 75, temperature 97.8 F (36.6 C), temperature source Oral, height 5\' 4"  (1.626 m), weight 205 lb (93 kg), SpO2 97 %. Body mass index is 35.19 kg/m. Physical Exam  Constitutional: She is oriented to person, place, and time. She appears well-developed and well-nourished.  Cardiovascular: Normal rate.  Pulmonary/Chest: Effort normal.  Musculoskeletal: Normal range of motion.  Neurological: She is oriented to person, place, and time.  Skin: Skin is warm and dry.  Psychiatric: She has a normal mood and affect. Her behavior is normal.  Vitals reviewed.   RECENT LABS AND TESTS: BMET    Component Value Date/Time   NA 147 (H) 06/25/2018 1039   K 4.2 06/25/2018 1039   CL 108 (H) 06/25/2018 1039   CO2 25 06/25/2018 1039   GLUCOSE 78 06/25/2018 1039   GLUCOSE 86 01/13/2018 1000   BUN 12 06/25/2018 1039   CREATININE 0.89 06/25/2018 1039   CREATININE 0.75 03/08/2015 1501   CALCIUM 9.2 06/25/2018 1039   GFRNONAA 74 06/25/2018 1039   GFRNONAA >89 03/08/2015 1501   GFRAA 86 06/25/2018 1039   GFRAA >89 03/08/2015 1501   Lab Results  Component Value Date   HGBA1C 4.9 06/25/2018   HGBA1C 4.9 12/10/2017   HGBA1C 5.0 09/10/2017   Lab Results  Component Value Date   INSULIN 8.2 06/25/2018   INSULIN  4.4 12/10/2017     INSULIN 6.1 09/10/2017   CBC    Component Value Date/Time   WBC 6.2 01/13/2018 1000   RBC 4.43 01/13/2018 1000   HGB 13.5 01/13/2018 1000   HGB 13.9 09/10/2017 1108   HCT 40.7 01/13/2018 1000   HCT 41.6 09/10/2017 1108   PLT 220 01/13/2018 1000   MCV 91.9 01/13/2018 1000   MCV 90 09/10/2017 1108   MCH 30.5 01/13/2018 1000   MCHC 33.2 01/13/2018 1000   RDW 13.4 01/13/2018 1000   RDW 14.1 09/10/2017 1108   LYMPHSABS 2.3 09/10/2017 1108   MONOABS 0.9 07/28/2015 0455   EOSABS 0.2 09/10/2017 1108   BASOSABS 0.0 09/10/2017 1108   Iron/TIBC/Ferritin/ %Sat No results found for: IRON, TIBC, FERRITIN, IRONPCTSAT Lipid Panel     Component Value Date/Time   CHOL 159 06/25/2018 1039   TRIG 100 06/25/2018 1039   HDL 73 06/25/2018 1039   LDLCALC 66 06/25/2018 1039   Hepatic Function Panel     Component Value Date/Time   PROT 6.2 06/25/2018 1039   ALBUMIN 4.0 06/25/2018 1039   AST 13 06/25/2018 1039   ALT 12 06/25/2018 1039   ALKPHOS 87 06/25/2018 1039   BILITOT 0.4 06/25/2018 1039      Component Value Date/Time   TSH 2.690 09/10/2017 1108   Results for Rachael SaverMARTIN, Emaree D (MRN [id number]) as of 08/12/2018 08:53  Ref. Range 06/25/2018 10:39  Vitamin D, 25-Hydroxy Latest Ref Range: 30.0 - 100.0 ng/mL 60.0   ASSESSMENT AND PLAN: Vitamin D deficiency - Plan: Vitamin D, Ergocalciferol, (DRISDOL) 50000 units CAPS capsule  Other depression - with emotional eating - Plan: buPROPion (WELLBUTRIN SR) 200 MG 12 hr tablet  At risk for heart disease  Class 2 severe obesity with serious comorbidity and body mass index (BMI) of 35.0 to 35.9 in adult, unspecified obesity type (HCC)  PLAN:  Vitamin D Deficiency Rachael Camacho was informed that low vitamin D levels contributes to fatigue and are associated with obesity, breast, and colon cancer. She agrees to continue to take prescription Vit D @50 ,000 IU every week #12 with no refills. We will recheck labs in 3 months and will follow up for routine  testing of vitamin D, at least 2-3 times per year. She was informed of the risk of over-replacement of vitamin D and agrees to not increase her dose unless she discusses this with us first. Rachael Camacho agrees to follow up as directed.  Depression with Emotional Eating Behaviors We discussed behavior modification techniques today to help Rachael Camacho deal with her emotional eating and depression. She has agreed to take Wellbutrin SR 200 mg qd #90 and follow up with our clinic in 3 months..  Cardiovascular risk counseling Rachael Camacho was given extended (15 minutes) coronary artery disease prevention counseling today. She is 52 y.o. female and has risk factors for heart disease including obesity. We discussed intensive lifestyle modifications today with an emphasis on specific weight loss instructions and strategies. Pt was also informed of the importance of increasing exercise and decreasing saturated fats to help prevent heart disease.  Obesity Rachael Camacho is not currently in the action stage of change. As such, her goal is to maintain weight for now and we will reassess in 3 months She has agreed to portion control better and make smarter food choices, such as increase vegetables and decrease simple carbohydrates  Rachael Camacho has been instructed to work up to a goal of 150 minutes of combined cardio and strengthening exercise per week for weight  loss and overall health benefits. We discussed the following Behavioral Modification Strategies today: better snacking choices, work on meal planning and easy cooking plans and dealing with family or coworker sabotage  Rachael Camacho has agreed to follow up with our clinic in 3 months. She was informed of the importance of frequent follow up visits to maximize her success with intensive lifestyle modifications for her multiple health conditions.   OBESITY BEHAVIORAL INTERVENTION VISIT  Today's visit was # 16 out of 22.  Starting weight: 207 lbs Starting date: 09/10/17 Today's weight :  205 lbs  Today's date: 08/11/2018 Total lbs lost to date: 2    ASK: We discussed the diagnosis of obesity with Rachael Camacho today and Rachael Camacho agreed to give us permission to discuss obesity behavioral modification therapy today.  ASSESS: Rachael Camacho has the diagnosis of obesity and her BMI today is 35.17 Rachael Camacho is not in the action stage of change   ADVISE: Rachael Camacho was educated on the multiple health risks of obesity as well as the benefit of weight loss to improve her health. She was advised of the need for long term treatment and the importance of lifestyle modifications.  AGREE: Multiple dietary modification options and treatment options were discussed and  Rachael Camacho agreed to the above obesity treatment plan.  I, [place] CraneJoanne Murray, am acting as transcriptionist for Quillian Quincearen Nyela Cortinas, MD  I have reviewed the above documentation for accuracy and completeness, and I agree with the above. -Quillian Quincearen Hosey Burmester, MD

## 2018-09-22 ENCOUNTER — Ambulatory Visit (HOSPITAL_COMMUNITY)
Admission: RE | Admit: 2018-09-22 | Discharge: 2018-09-22 | Disposition: A | Discharge status: Home / Self Care | Payer: BLUE CROSS/BLUE SHIELD | Source: Ambulatory Visit | Attending: Internal Medicine | Admitting: Internal Medicine

## 2018-09-22 VITALS — BP 112/66 | HR 67 | Wt 208.8 lb

## 2018-09-22 DIAGNOSIS — M349 Systemic sclerosis, unspecified: Secondary | ICD-10-CM | POA: Diagnosis not present

## 2018-09-22 DIAGNOSIS — Z8349 Family history of other endocrine, nutritional and metabolic diseases: Secondary | ICD-10-CM | POA: Insufficient documentation

## 2018-09-22 DIAGNOSIS — Z818 Family history of other mental and behavioral disorders: Secondary | ICD-10-CM | POA: Insufficient documentation

## 2018-09-22 DIAGNOSIS — Z8249 Family history of ischemic heart disease and other diseases of the circulatory system: Secondary | ICD-10-CM | POA: Insufficient documentation

## 2018-09-22 DIAGNOSIS — Z809 Family history of malignant neoplasm, unspecified: Secondary | ICD-10-CM | POA: Diagnosis not present

## 2018-09-22 DIAGNOSIS — Z825 Family history of asthma and other chronic lower respiratory diseases: Secondary | ICD-10-CM | POA: Diagnosis not present

## 2018-09-22 DIAGNOSIS — I2721 Secondary pulmonary arterial hypertension: Secondary | ICD-10-CM | POA: Diagnosis not present

## 2018-09-22 DIAGNOSIS — Z9884 Bariatric surgery status: Secondary | ICD-10-CM | POA: Diagnosis not present

## 2018-09-22 DIAGNOSIS — I7300 Raynaud's syndrome without gangrene: Secondary | ICD-10-CM | POA: Diagnosis not present

## 2018-09-22 DIAGNOSIS — G4733 Obstructive sleep apnea (adult) (pediatric): Secondary | ICD-10-CM | POA: Insufficient documentation

## 2018-09-22 DIAGNOSIS — K219 Gastro-esophageal reflux disease without esophagitis: Secondary | ICD-10-CM | POA: Diagnosis not present

## 2018-09-22 DIAGNOSIS — Z79899 Other long term (current) drug therapy: Secondary | ICD-10-CM | POA: Diagnosis not present

## 2018-09-22 DIAGNOSIS — K589 Irritable bowel syndrome without diarrhea: Secondary | ICD-10-CM | POA: Diagnosis not present

## 2018-09-22 DIAGNOSIS — I890 Lymphedema, not elsewhere classified: Secondary | ICD-10-CM | POA: Insufficient documentation

## 2018-09-22 DIAGNOSIS — Z79890 Hormone replacement therapy (postmenopausal): Secondary | ICD-10-CM | POA: Insufficient documentation

## 2018-09-22 DIAGNOSIS — E669 Obesity, unspecified: Secondary | ICD-10-CM | POA: Diagnosis not present

## 2018-09-22 DIAGNOSIS — I272 Pulmonary hypertension, unspecified: Secondary | ICD-10-CM | POA: Diagnosis not present

## 2018-09-22 DIAGNOSIS — I5032 Chronic diastolic (congestive) heart failure: Secondary | ICD-10-CM | POA: Insufficient documentation

## 2018-09-22 DIAGNOSIS — E559 Vitamin D deficiency, unspecified: Secondary | ICD-10-CM | POA: Diagnosis not present

## 2018-09-22 DIAGNOSIS — J8410 Pulmonary fibrosis, unspecified: Secondary | ICD-10-CM | POA: Insufficient documentation

## 2018-09-22 DIAGNOSIS — J9610 Chronic respiratory failure, unspecified whether with hypoxia or hypercapnia: Secondary | ICD-10-CM | POA: Diagnosis not present

## 2018-09-22 NOTE — Progress Notes (Signed)
Advanced Heart Failure Clinic Note   Patient ID: Rachael Camacho, female   DOB: 1966-12-09, 52 y.o.   MRN: [id number] PCP: Primary Cardiologist: Dr Rachael Camacho.  Primary HF: Dr Rachael Camacho Pulmonary: Dr Rachael Camacho.  Rheumatologist: Dr. Kathi Camacho [facility]   HPI: Rachael Camacho is a 52 year old with a history of  OSA, chornic diastolic HF, obesity, scleroderma, and Raynauds disease referred to HF clinic by Dr Rachael Camacho.   A year ago she was exercising without difficulty. She was able to do ballroom dancing and walking on the treadmill a mile or two without difficulty. Last September she noticed dyspnea with steps. Had another function decline in October and had increased dyspnea walking a block.  Symptoms progressed in October and now SOB with minimal activity. She says her weight has been 250 pounds for the last 2 years.  Has been seen by Dr Rachael Camacho and had PFTs as below. Also referred for RHC in 3/16 with mild PH but PVR 2.8 WU. Started 20 mg po lasix in the fall of last year.   She presents today for PAH follow up. We have not seen her since 3/18. Overall doing okay. Remains on macitentan 10 mg daily. SOB is a little worse since this summer. SOB with steps and hills and long distances. No SOB with ADLs. She has not been working out like she used to. Takes lasix 20 mg daily. Has chronic lymphedema. Sleeps sitting up chronically. No CP. Has been having a hard time with balance, but this is not new. No dizziness. Taking all medications. Occasionally skips lasix. No recent scleroderma flares. Weights stable ~205-207 lbs at home. Denies snoring. Had gastric sleeve in 2017. Lost 60-65 pounds but recently gained 10-15 pounds. Gets 6k-10k on her FitBit.   Studies/Tests   01/2015- 93% at rest but non walking she desaturate to 84%   Echo 11/12/14 showed grade 2 diastolic dysfunction, with normal systolic function Echo  01/29/17  EF 60% normal RV no PAH  CT angio 11/2014 was negative for pulmonary embolism, but  showed mosaic pattern suggestive of early interstitial edema >> no response to Lasix CT angio 12/16: No ILD  VQ 7/16: No PE  Home sleep study 02/22/15.  Consistent with mild sleep apnea Desaturation noted.   RHC 02/2017: RA = 7 RV = 62/11 PA = 61/23 (39) PCW = 8 Fick cardiac output/index = 5.8/3.0 PVR = 5.3 WU Ao sat = 98% PA sat = 74%, 73% Mild to moderate PAH in setting of scleroderma.  Plan/Discussion: Start Macitentan 10mg  daily.  PFTs 03/09/15 no obstruction, mild restriction TLC 78%, DLCO 52% predicted  PFTs  11/16 FEV1 2.75L FVC   3.17L DLCO 52%  PFTs 01/2017: FEV1: 2.83 FVC: 3.42 DLCO: 60%  RHC 12/16 RA = 7 RV = 42/2/8 PA = 44/16 (28) PCW = 9 Fick cardiac output/index = 6.9/3.6 PVR = 2.8 WU Ao sat = 95% PA sat = 73% 76%  RHC [facility] 03/14/2015  RA 13/12 mean 10 mm Hg RV 53/11 mm Hg PA 50/19 mean 33 mm Hg  PCWP 18/16 mean 14 mm Hg LV 169/13/22 mm Hg  AO 165/92 mean 119 mm Hg Oxygen saturations: PA 73% AO 95% Cardiac Output (Fick) 6.9 L/min  Cardiac Index (Fick) 3.2 L/min/meter squared PVR = 2.8 WU No significant CAD     Review of systems complete and found to be negative unless listed in HPI.   SH:  Social History   Socioeconomic History  . Marital status: Married  Spouse name: Rachael Camacho  . Number of children: 2  . Years of education: Not on file  . Highest education level: Not on file  Occupational History  . Occupation: Facilities manager    Comment: Office Work  Social Needs  . Financial resource strain: Not on file  . Food insecurity:    Worry: Not on file    Inability: Not on file  . Transportation needs:    Medical: Not on file    Non-medical: Not on file  Tobacco Use  . Smoking status: Never Smoker  . Smokeless tobacco: Never Used  Substance and Sexual Activity  . Alcohol use: Yes    Alcohol/week: 0.0 standard drinks    Comment: seldom  . Drug use: No  . Sexual activity: Not on file    Lifestyle  . Physical activity:    Days per week: Not on file    Minutes per session: Not on file  . Stress: Not on file  Relationships  . Social connections:    Talks on phone: Not on file    Gets together: Not on file    Attends religious service: Not on file    Active member of club or organization: Not on file    Attends meetings of clubs or organizations: Not on file    Relationship status: Not on file  . Intimate partner violence:    Fear of current or ex partner: Not on file    Emotionally abused: Not on file    Physically abused: Not on file    Forced sexual activity: Not on file  Other Topics Concern  . Not on file  Social History Narrative   Lives at home with her husband   Right handed    FH:  Family History  Problem Relation Age of Onset  . Heart failure Mother        Stent put in 2014 for blockage  . COPD Mother   . Hypertension Mother   . Hyperlipidemia Mother   . Heart disease Mother   . Thyroid disease Mother   . Cancer Mother   . Depression Mother   . Anxiety disorder Mother   . Heart disease Father   . Heart disease Son        Repair ASD  . Neuropathy Neg Hx     Past Medical History:  Diagnosis Date  . Anemia    as child in first grade  . Anxiety   . Arthritis   . Back pain   . Constipation   . CREST syndrome (HCC)   . Dyspnea   . Family history of adverse reaction to anesthesia    mom has had problems in past - N/V post op on one occasion; difficult time waking up on another occasion  . Gallbladder problem   . GERD (gastroesophageal reflux disease)   . Gestational diabetes mellitus in childbirth, diet controlled 1987  . IBS (irritable bowel syndrome)   . Joint pain   . Leg edema   . Menopause   . Osteoarthritis (arthritis due to wear and tear of joints)   . PAH (pulmonary artery hypertension) (HCC)   . Pulmonary fibrosis (HCC)   . Raynaud disease    hands-toes  . Scleroderma (HCC)   . Shortness of breath dyspnea    pulmonary  fibrosis-scleraderma  . Sleep apnea   . Swallowing difficulty   . Thyroid cyst   . Vitamin D deficiency   . Wears glasses  Current Outpatient Medications  Medication Sig Dispense Refill  . baclofen (LIORESAL) 10 MG tablet Take 1 tablet (10 mg total) by mouth 3 (three) times daily. (Patient taking differently: Take 10 mg by mouth at bedtime. ) 90 each 11  . Biotin 5000 MCG CAPS Take 5,000 mcg by mouth daily.    [name] buPROPion (WELLBUTRIN SR) 200 MG 12 hr tablet Take 1 tablet (200 mg total) by mouth daily. 90 tablet 0  . Cyanocobalamin (VITAMIN B-12) 5000 MCG SUBL Take 5,000 mcg by mouth daily.    [name] desloratadine (CLARINEX) 5 MG tablet Take 5 mg by mouth at bedtime.   12  . diclofenac sodium (VOLTAREN) 1 % GEL Apply 2-4 g topically 4 (four) times daily as needed (for knee pain.).    [name] fluticasone (FLONASE) 50 MCG/ACT nasal spray Place 1 spray into both nostrils at bedtime. Aller-Flo    . furosemide (LASIX) 20 MG tablet TAKE 1 TABLET (20 MG TOTAL) BY MOUTH DAILY. 90 tablet 3  . gabapentin (NEURONTIN) 100 MG capsule Take 1 capsule (100 mg total) by mouth 3 (three) times daily. (Patient taking differently: Take 100 mg by mouth at bedtime. ) 90 capsule 6  . levothyroxine (SYNTHROID, LEVOTHROID) 50 MCG tablet Take 25 mcg by mouth daily before breakfast.   6  . macitentan (OPSUMIT) 10 MG tablet Take 1 tablet (10 mg total) by mouth daily. NEEDS FOLLOW UP APPOINTMENT 90 tablet 3  . Multiple Vitamin (MULTIVITAMIN WITH MINERALS) TABS tablet Take 1 tablet by mouth daily.    . Probiotic Product (PROBIOTIC PO) Take 1 capsule by mouth daily.    . Vitamin D, Ergocalciferol, (DRISDOL) 50000 units CAPS capsule Take 1 capsule (50,000 Units total) by mouth every 7 (seven) days. 12 capsule 0   No current facility-administered medications for this encounter.     Vitals:   09/22/18 1456  BP: 112/66  Pulse: 67  SpO2: 100%  Weight: 94.7 kg (208 lb 12.8 oz)   Filed Weights   09/22/18 1456  Weight: 94.7 kg  (208 lb 12.8 oz)    PHYSICAL EXAM:  General: Well appearing. No resp difficulty. HEENT: Normal Neck: Supple. JVP not elevated . Carotids 2+ bilat; no bruits. No thyromegaly or nodule noted. Cor: PMI nondisplaced. RRR, No M/G/R noted. Non accentuated P2 Lungs: CTAB, normal effort. Abdomen: Obese Soft, non-tender, non-distended, no HSM. No bruits or masses. +BS  Extremities: No cyanosis, clubbing, or rash. R and LLE trace edema.  Neuro: Alert & orientedx3, cranial nerves grossly intact. moves all 4 extremities w/o difficulty. Affect pleasant   ASSESSMENT & PLAN:  1. Pulmonary HTN - mild PAH in setting of scleroderma. - RHC 02/2017: PA 61/23 (39), PCW 8. - Continue opsimut 10 mg daily 2. Chronic Diastolic HF - Echo 01/2017: EF 40-98%, RV normal - NYHA class II - Volume status stable - Continue lasix 20 mg daily 3. OSA - CPAP per Dr Rachael Camacho. No longer wearing.  4. Scleroderma 5. Chronic respiratory failure with exertional desaturations - Follows with Dr Rachael Camacho - PFTs 01/2017: DLCO 60% 6. Obesity s/p gastric bypass 07/26/15  Repeat echo  Rachael Highland, NP 3:10 PM  Patient seen and examined with the above-signed Advanced Practice Provider and/or Housestaff. I personally reviewed laboratory data, imaging studies and relevant notes. I independently examined the patient and formulated the important aspects of the plan. I have edited the note to reflect any of my changes or salient points. I have personally discussed the plan with the patient and/or  family.  Overall doing fairly well. NYHA II. Volume status looks good Currently on singe therapy for PAH. Will check echo. If pressure normal will continue single therapy. If pressures mild to moderately elevated would add Adcrica or sildenafil. If significantly elevated will consider repeat RHC for further evaluation.   Arvilla Meres, MD  3:45 PM

## 2018-09-22 NOTE — Patient Instructions (Signed)
Your physician has requested that you have an echocardiogram. Echocardiography is a painless test that uses sound waves to create images of your heart. It provides your doctor with information about the size and shape of your heart and how well your heart's chambers and valves are working. This procedure takes approximately one hour. There are no restrictions for this procedure.  AT [facility], THEY WILL CALL YOU TO SCHEDULE, THEY ARE LOCATED AT:  [address], [address] [place], [place] [address] [phone]  We will contact you in 6 months to schedule your next appointment.

## 2018-09-22 NOTE — Addendum Note (Signed)
Encounter addended by: Noralee Space, RN on: 09/22/2018 3:55 PM  Actions taken: Order list changed, Diagnosis association updated

## 2018-09-29 ENCOUNTER — Other Ambulatory Visit: Payer: Self-pay

## 2018-09-29 ENCOUNTER — Ambulatory Visit (HOSPITAL_COMMUNITY): Payer: BLUE CROSS/BLUE SHIELD | Attending: Cardiovascular Disease

## 2018-09-29 DIAGNOSIS — I272 Pulmonary hypertension, unspecified: Secondary | ICD-10-CM | POA: Insufficient documentation

## 2018-09-29 DIAGNOSIS — I5032 Chronic diastolic (congestive) heart failure: Secondary | ICD-10-CM | POA: Insufficient documentation

## 2018-09-29 DIAGNOSIS — M349 Systemic sclerosis, unspecified: Secondary | ICD-10-CM | POA: Diagnosis not present

## 2018-09-29 DIAGNOSIS — G4733 Obstructive sleep apnea (adult) (pediatric): Secondary | ICD-10-CM | POA: Diagnosis not present

## 2018-09-29 DIAGNOSIS — I340 Nonrheumatic mitral (valve) insufficiency: Secondary | ICD-10-CM | POA: Diagnosis not present

## 2018-10-03 LAB — BASIC METABOLIC PANEL
BUN: 15 (ref 4–21)
CREATININE: 0.9 (ref 0.5–1.1)
GLUCOSE: 79
Potassium: 4.1 (ref 3.4–5.3)
Sodium: 142 (ref 137–147)

## 2018-10-03 LAB — HEPATIC FUNCTION PANEL
ALT: 14 (ref 7–35)
AST: 18 (ref 13–35)
BILIRUBIN, TOTAL: 0.5

## 2018-10-03 LAB — VITAMIN D 25 HYDROXY (VIT D DEFICIENCY, FRACTURES): Vit D, 25-Hydroxy: 50.8

## 2018-10-03 LAB — CBC AND DIFFERENTIAL
HEMATOCRIT: 41 (ref 36–46)
Hemoglobin: 13.8 (ref 12.0–16.0)
PLATELETS: 216 (ref 150–399)
WBC: 6

## 2018-10-03 LAB — LIPID PANEL
Cholesterol: 166 (ref 0–200)
HDL: 68 (ref 35–70)
LDL Cholesterol: 74

## 2018-10-03 LAB — TSH: TSH: 2 (ref 0.41–5.90)

## 2018-10-03 LAB — VITAMIN B12

## 2018-10-28 ENCOUNTER — Encounter (INDEPENDENT_AMBULATORY_CARE_PROVIDER_SITE_OTHER): Payer: Self-pay | Admitting: Family Medicine

## 2018-10-28 ENCOUNTER — Encounter (INDEPENDENT_AMBULATORY_CARE_PROVIDER_SITE_OTHER): Payer: Self-pay

## 2018-10-28 ENCOUNTER — Ambulatory Visit (INDEPENDENT_AMBULATORY_CARE_PROVIDER_SITE_OTHER): Payer: BLUE CROSS/BLUE SHIELD | Admitting: Family Medicine

## 2018-10-28 VITALS — BP 109/74 | HR 69 | Temp 97.8°F | Ht 64.0 in | Wt 207.0 lb

## 2018-10-28 DIAGNOSIS — Z6835 Body mass index (BMI) 35.0-35.9, adult: Secondary | ICD-10-CM

## 2018-10-28 DIAGNOSIS — Z9189 Other specified personal risk factors, not elsewhere classified: Secondary | ICD-10-CM | POA: Diagnosis not present

## 2018-10-28 DIAGNOSIS — F3289 Other specified depressive episodes: Secondary | ICD-10-CM | POA: Diagnosis not present

## 2018-10-28 DIAGNOSIS — E559 Vitamin D deficiency, unspecified: Secondary | ICD-10-CM | POA: Diagnosis not present

## 2018-10-28 MED ORDER — VITAMIN D (ERGOCALCIFEROL) 1.25 MG (50000 UNIT) PO CAPS: 50000.0000 [IU] | ORAL_CAPSULE | ORAL | 0 refills | Status: DC

## 2018-10-28 MED ORDER — BUPROPION HCL ER (SR) 200 MG PO TB12: 200.0000 mg | ORAL_TABLET | Freq: Every day | ORAL | 0 refills | Status: DC

## 2018-10-28 NOTE — Progress Notes (Signed)
Office: [phone]  /  Fax: [phone]   HPI:   Chief Complaint: OBESITY Rachael Camacho is here to discuss her progress with her obesity treatment plan. She is on the portion control better and make smarter food choices plan and is following her eating plan approximately 0 % of the time. She states she is getting a lot of steps in for exercise. [name] last visit was three months ago. She has struggled to follow our advise to keep a food journal or follow any of our structured plans. Rachael Camacho is mostly maintaining her weight with a small amount of weight gain over th last three months. Her weight is 207 lb (93.9 kg) today and has had a weight gain of 2 pounds over a period of 3 months since her last visit. She has lost 0 lbs since starting treatment with [place].  Vitamin D deficiency Rachael Camacho has a diagnosis of vitamin D deficiency. She is stable on prescription vit D and her last vitamin D lab was at goal. She denies nausea, vomiting or muscle weakness.  At risk for cardiovascular disease Rachael Camacho is at a higher than average risk for cardiovascular disease due to obesity. She currently denies any chest pain.  Depression with emotional eating behaviors [name] mood is stable on Wellbutrin and she notes a decrease in emotional eating. She struggles with emotional eating and using food for comfort to the extent that it is negatively impacting her health. She often snacks when she is not hungry. Rachael Camacho sometimes feels she is out of control and then feels guilty that she made poor food choices. She has been working on behavior modification techniques to help reduce her emotional eating and has been somewhat successful.  She shows no sign of suicidal or homicidal ideations.  Depression screen [facility] 2/9 09/10/2017 10/18/2016 07/18/2016 05/02/2016 03/01/2016  Decreased Interest 2 0 0 0 0  Down, Depressed, Hopeless 2 0 0 0 0  PHQ - 2 Score 4 0 0 0 0  Altered sleeping 1 - - - -  Tired, decreased energy 3 - - - -    Change in appetite 1 - - - -  Feeling bad or failure about yourself  3 - - - -  Trouble concentrating 2 - - - -  Moving slowly or fidgety/restless 2 - - - -  Suicidal thoughts 0 - - - -  PHQ-9 Score 16 - - - -  Difficult doing work/chores Somewhat difficult - - - -     ALLERGIES: Allergies  Allergen Reactions  . Amoxicillin Other (See Comments)    Immediate yeast infection  Has patient had a PCN reaction causing immediate rash, facial/tongue/throat swelling, SOB or lightheadedness with hypotension: No Has patient had a PCN reaction causing severe rash involving mucus membranes or skin necrosis: No Has patient had a PCN reaction that required hospitalization: No Has patient had a PCN reaction occurring within the last 10 years: No If all of the above answers are "NO", then may proceed with Cephalosporin use.   [name] Percocet [Oxycodone-Acetaminophen] Itching    Tolerates acetaminophen alone  . [name] [Nitrofurantoin Monohyd Macro] Rash and Hives  . [name] Hives and Rash    Reported chicken pox-like rash, not hives  . Sulfa Antibiotics Rash and Hives    MEDICATIONS: Current Outpatient Medications on File Prior to Visit  Medication Sig Dispense Refill  . baclofen (LIORESAL) 10 MG tablet Take 1 tablet (10 mg total) by mouth 3 (three) times daily. (Patient taking differently: Take 10  mg by mouth at bedtime. ) 90 each 11  . Biotin 5000 MCG CAPS Take 5,000 mcg by mouth daily.    . Cyanocobalamin (VITAMIN B-12) 5000 MCG SUBL Take 5,000 mcg by mouth daily.    [name] desloratadine (CLARINEX) 5 MG tablet Take 5 mg by mouth at bedtime.   12  . diclofenac sodium (VOLTAREN) 1 % GEL Apply 2-4 g topically 4 (four) times daily as needed (for knee pain.).    [name] fluticasone (FLONASE) 50 MCG/ACT nasal spray Place 1 spray into both nostrils at bedtime. Aller-Flo    . furosemide (LASIX) 20 MG tablet TAKE 1 TABLET (20 MG TOTAL) BY MOUTH DAILY. 90 tablet 3  . gabapentin (NEURONTIN) 100 MG capsule Take  1 capsule (100 mg total) by mouth 3 (three) times daily. (Patient taking differently: Take 100 mg by mouth at bedtime. ) 90 capsule 6  . levothyroxine (SYNTHROID, LEVOTHROID) 50 MCG tablet Take 25 mcg by mouth daily before breakfast.   6  . macitentan (OPSUMIT) 10 MG tablet Take 1 tablet (10 mg total) by mouth daily. NEEDS FOLLOW UP APPOINTMENT 90 tablet 3  . Multiple Vitamin (MULTIVITAMIN WITH MINERALS) TABS tablet Take 1 tablet by mouth daily.    . Probiotic Product (PROBIOTIC PO) Take 1 capsule by mouth daily.     No current facility-administered medications on file prior to visit.     PAST MEDICAL HISTORY: Past Medical History:  Diagnosis Date  . Anemia    as child in first grade  . Anxiety   . Arthritis   . Back pain   . Constipation   . CREST syndrome (HCC)   . Dyspnea   . Family history of adverse reaction to anesthesia    mom has had problems in past - N/V post op on one occasion; difficult time waking up on another occasion  . Gallbladder problem   . GERD (gastroesophageal reflux disease)   . Gestational diabetes mellitus in childbirth, diet controlled 1987  . IBS (irritable bowel syndrome)   . Joint pain   . Leg edema   . Menopause   . Osteoarthritis (arthritis due to wear and tear of joints)   . PAH (pulmonary artery hypertension) (HCC)   . Pulmonary fibrosis (HCC)   . Raynaud disease    hands-toes  . Scleroderma (HCC)   . Shortness of breath dyspnea    pulmonary fibrosis-scleraderma  . Sleep apnea   . Swallowing difficulty   . Thyroid cyst   . Vitamin D deficiency   . Wears glasses     PAST SURGICAL HISTORY: Past Surgical History:  Procedure Laterality Date  . CARDIAC CATHETERIZATION N/A 11/25/2015   Procedure: Right Heart Cath;  Surgeon: Dolores Patty, MD;  Location: [facility] INVASIVE CV LAB;  Service: Cardiovascular;  Laterality: N/A;  . CESAREAN SECTION  87,95  . CHOLECYSTECTOMY  1996   lap choli  . COLONOSCOPY    . ESOPHAGOGASTRODUODENOSCOPY (EGD)  WITH PROPOFOL N/A 02/12/2017   Procedure: ESOPHAGOGASTRODUODENOSCOPY (EGD) WITH PROPOFOL;  Surgeon: Vida Rigger, MD;  Location: [facility] ENDOSCOPY;  Service: Endoscopy;  Laterality: N/A;  . HIATAL HERNIA REPAIR  07/26/2015   Procedure: LAPAROSCOPIC REPAIR OF HIATAL HERNIA;  Surgeon: Luretha Murphy, MD;  Location: [facility] ORS;  Service: General;;  . LAPAROSCOPIC GASTRIC SLEEVE RESECTION N/A 07/26/2015   Procedure: LAPAROSCOPIC GASTRIC SLEEVE RESECTION;  Surgeon: Luretha Murphy, MD;  Location: [facility] ORS;  Service: General;  Laterality: N/A;  . LEFT AND RIGHT HEART CATHETERIZATION WITH CORONARY ANGIOGRAM N/A 03/14/2015  Procedure: LEFT AND RIGHT HEART CATHETERIZATION WITH CORONARY ANGIOGRAM;  Surgeon: Peter M Swaziland, MD;  Location[facility] C[facility] LAB;  Service: Cardiovascular;  Laterality: N/A;  . POSTERIOR CERVICAL FUSION/FORAMINOTOMY N/A 01/16/2018   Procedure: Cervical one-two Posterior instrumentation and fusion;  Surgeon: Tressie Stalker, MD;  Location: [facility] OR;  Service: Neurosurgery;  Laterality: N/A;  . RADIAL HEAD ARTHROPLASTY Right 07/28/2014   Procedure: RIGHT RADIAL HEAD REPLACEMENT;  Surgeon: Dairl Ponder, MD;  Location: [facility];  Service: Orthopedics;  Laterality: Right;  . RIGHT HEART CATH N/A 03/18/2017   Procedure: Right Heart Cath;  Surgeon: Dolores Patty, MD;  Location: [facility] INVASIVE CV LAB;  Service: Cardiovascular;  Laterality: N/A;  . SAVORY DILATION N/A 02/12/2017   Procedure: SAVORY DILATION;  Surgeon: Vida Rigger, MD;  Location: [facility] ENDOSCOPY;  Service: Endoscopy;  Laterality: N/A;  . TUBAL LIGATION    . UPPER GI ENDOSCOPY  07/26/2015   Procedure: UPPER GI ENDOSCOPY;  Surgeon: Luretha Murphy, MD;  Location: [facility] ORS;  Service: General;;  . VAGINAL HYSTERECTOMY  2010   LAVH    SOCIAL HISTORY: Social History   Tobacco Use  . Smoking status: Never Smoker  . Smokeless tobacco: Never Used  Substance Use Topics  . Alcohol use: Yes    Alcohol/week: 0.0 standard drinks    Comment:  seldom  . Drug use: No    FAMILY HISTORY: Family History  Problem Relation Age of Onset  . Heart failure Mother        Stent put in 2014 for blockage  . COPD Mother   . Hypertension Mother   . Hyperlipidemia Mother   . Heart disease Mother   . Thyroid disease Mother   . Cancer Mother   . Depression Mother   . Anxiety disorder Mother   . Heart disease Father   . Heart disease Son        Repair ASD  . Neuropathy Neg Hx     ROS: Review of Systems  Constitutional: Negative for weight loss.  Cardiovascular: Negative for chest pain.  Gastrointestinal: Negative for nausea and vomiting.  Musculoskeletal:       Negative for muscle weakness  Psychiatric/Behavioral: Positive for depression. Negative for suicidal ideas.    PHYSICAL EXAM: Blood pressure 109/74, pulse 69, temperature 97.8 F (36.6 C), temperature source Oral, height 5\' 4"  (1.626 m), weight 207 lb (93.9 kg), SpO2 91 %. Body mass index is 35.53 kg/m. Physical Exam  Constitutional: She is oriented to person, place, and time. She appears well-developed and well-nourished.  Cardiovascular: Normal rate.  Pulmonary/Chest: Effort normal.  Musculoskeletal: Normal range of motion.  Neurological: She is oriented to person, place, and time.  Skin: Skin is warm and dry.  Psychiatric: She has a normal mood and affect. Her behavior is normal. She expresses no homicidal and no suicidal ideation.  Vitals reviewed.   RECENT LABS AND TESTS: BMET    Component Value Date/Time   NA 147 (H) 06/25/2018 1039   K 4.2 06/25/2018 1039   CL 108 (H) 06/25/2018 1039   CO2 25 06/25/2018 1039   GLUCOSE 78 06/25/2018 1039   GLUCOSE 86 01/13/2018 1000   BUN 12 06/25/2018 1039   CREATININE 0.89 06/25/2018 1039   CREATININE 0.75 03/08/2015 1501   CALCIUM 9.2 06/25/2018 1039   GFRNONAA 74 06/25/2018 1039   GFRNONAA >89 03/08/2015 1501   GFRAA 86 06/25/2018 1039   GFRAA >89 03/08/2015 1501   Lab Results  Component Value Date  HGBA1C 4.9 06/25/2018   HGBA1C 4.9 12/10/2017   HGBA1C 5.0 09/10/2017   Lab Results  Component Value Date   INSULIN 8.2 06/25/2018   INSULIN 4.4 12/10/2017   INSULIN 6.1 09/10/2017   CBC    Component Value Date/Time   WBC 6.2 01/13/2018 1000   RBC 4.43 01/13/2018 1000   HGB 13.5 01/13/2018 1000   HGB 13.9 09/10/2017 1108   HCT 40.7 01/13/2018 1000   HCT 41.6 09/10/2017 1108   PLT 220 01/13/2018 1000   MCV 91.9 01/13/2018 1000   MCV 90 09/10/2017 1108   MCH 30.5 01/13/2018 1000   MCHC 33.2 01/13/2018 1000   RDW 13.4 01/13/2018 1000   RDW 14.1 09/10/2017 1108   LYMPHSABS 2.3 09/10/2017 1108   MONOABS 0.9 07/28/2015 0455   EOSABS 0.2 09/10/2017 1108   BASOSABS 0.0 09/10/2017 1108   Iron/TIBC/Ferritin/ %Sat No results found for: IRON, TIBC, FERRITIN, IRONPCTSAT Lipid Panel     Component Value Date/Time   CHOL 159 06/25/2018 1039   TRIG 100 06/25/2018 1039   HDL 73 06/25/2018 1039   LDLCALC 66 06/25/2018 1039   Hepatic Function Panel     Component Value Date/Time   PROT 6.2 06/25/2018 1039   ALBUMIN 4.0 06/25/2018 1039   AST 13 06/25/2018 1039   ALT 12 06/25/2018 1039   ALKPHOS 87 06/25/2018 1039   BILITOT 0.4 06/25/2018 1039      Component Value Date/Time   TSH 2.690 09/10/2017 1108   Results for FAUSTINA, GEBERT (MRN [id number]) as of 10/28/2018 12:03  Ref. Range 06/25/2018 10:39  Vitamin D, 25-Hydroxy Latest Ref Range: 30.0 - 100.0 ng/mL 60.0   ASSESSMENT AND PLAN: Vitamin D deficiency - Plan: Comprehensive metabolic panel, VITAMIN D 25 Hydroxy (Vit-D Deficiency, Fractures), Vitamin D, Ergocalciferol, (DRISDOL) 50000 units CAPS capsule  Other depression - with emotional eating - Plan: buPROPion (WELLBUTRIN SR) 200 MG 12 hr tablet  At risk for heart disease  Class 2 severe obesity with serious comorbidity and body mass index (BMI) of 35.0 to 35.9 in adult, unspecified obesity type (HCC)  PLAN:  Vitamin D Deficiency Lugene was informed that low vitamin D  levels contributes to fatigue and are associated with obesity, breast, and colon cancer. She agrees to continue to take prescription Vit D @50 ,000 IU every week #12 with no refills and will follow up for routine testing of vitamin D, at least 2-3 times per year. She was informed of the risk of over-replacement of vitamin D and agrees to not increase her dose unless she discusses this with [place] first. Astryd agrees to follow up with our clinic in 3 months.  Cardiovascular risk counseling Yanessa was given extended (15 minutes) coronary artery disease prevention counseling today. She is 52 y.o. female and has risk factors for heart disease including obesity. We discussed intensive lifestyle modifications today with an emphasis on specific weight loss instructions and strategies. Pt was also informed of the importance of increasing exercise and decreasing saturated fats to help prevent heart disease.  Depression with Emotional Eating Behaviors We discussed behavior modification techniques today to help Dasia deal with her emotional eating and depression. She has agreed to take Wellbutrin SR 200 mg qd #90 with no refills and follow up as directed.  Obesity Betha is currently in the action stage of change. As such, her goal is to maintain weight for now She has agreed to portion control better and make smarter food choices, such as increase vegetables and decrease simple  carbohydrates  Preslynn has been instructed to work up to a goal of 150 minutes of combined cardio and strengthening exercise per week for weight loss and overall health benefits. We discussed the following Behavioral Modification Strategies today: increasing lean protein intake, decreasing simple carbohydrates  and work on meal planning and easy cooking plans  Felecity has agreed to follow up with our clinic in 3 months. She was informed of the importance of frequent follow up visits to maximize her success with intensive lifestyle  modifications for her multiple health conditions.   OBESITY BEHAVIORAL INTERVENTION VISIT  Today's visit was # 17   Starting weight: 207 lbs Starting date: 09/10/17 Today's weight : 207 lbs  Today's date: 10/28/2018 Total lbs lost to date: 0    ASK: We discussed the diagnosis of obesity with Burley Saver today and Brendaly agreed to give [place] permission to discuss obesity behavioral modification therapy today.  ASSESS: Britainy has the diagnosis of obesity and her BMI today is 35.51 Safaa is in the action stage of change   ADVISE: Adreanne was educated on the multiple health risks of obesity as well as the benefit of weight loss to improve her health. She was advised of the need for long term treatment and the importance of lifestyle modifications to improve her current health and to decrease her risk of future health problems.  AGREE: Multiple dietary modification options and treatment options were discussed and  Candice agreed to follow the recommendations documented in the above note.  ARRANGE: Pearson was educated on the importance of frequent visits to treat obesity as outlined per CMS and USPSTF guidelines and agreed to schedule her next follow up appointment today.  I, [place] Crane, am acting as transcriptionist for Quillian Quince, MD  I have reviewed the above documentation for accuracy and completeness, and I agree with the above. -Quillian Quince, MD

## 2018-10-29 LAB — COMPREHENSIVE METABOLIC PANEL
A/G RATIO: 1.8 (ref 1.2–2.2)
ALK PHOS: 87 IU/L (ref 39–117)
ALT: 13 IU/L (ref 0–32)
AST: 17 IU/L (ref 0–40)
Albumin: 4.1 g/dL (ref 3.5–5.5)
BILIRUBIN TOTAL: 0.4 mg/dL (ref 0.0–1.2)
BUN/Creatinine Ratio: 15 (ref 9–23)
BUN: 13 mg/dL (ref 6–24)
CHLORIDE: 105 mmol/L (ref 96–106)
CO2: 24 mmol/L (ref 20–29)
Calcium: 9.3 mg/dL (ref 8.7–10.2)
Creatinine, Ser: 0.88 mg/dL (ref 0.57–1.00)
GFR calc Af Amer: 87 mL/min/{1.73_m2} (ref >59)
GFR calc non Af Amer: 75 mL/min/{1.73_m2} (ref >59)
GLUCOSE: 81 mg/dL (ref 65–99)
Globulin, Total: 2.3 g/dL (ref 1.5–4.5)
POTASSIUM: 4 mmol/L (ref 3.5–5.2)
Sodium: 143 mmol/L (ref 134–144)
Total Protein: 6.4 g/dL (ref 6.0–8.5)

## 2018-10-29 LAB — VITAMIN D 25 HYDROXY (VIT D DEFICIENCY, FRACTURES): VIT D 25 HYDROXY: 45.5 ng/mL (ref 30.0–100.0)

## 2018-11-01 IMAGING — US US THYROID
1 series · 13 of 25 positions shown · non-contrast
Comparison: None available

CLINICAL DATA: Left thyroid nodule on exam

EXAM:
THYROID ULTRASOUND
TECHNIQUE: Ultrasound examination of the thyroid gland and adjacent soft
tissues was performed.

[Series 1: us thyroid · 0.06mm/px · 13 of 89 slices shown]
[im 1/89]
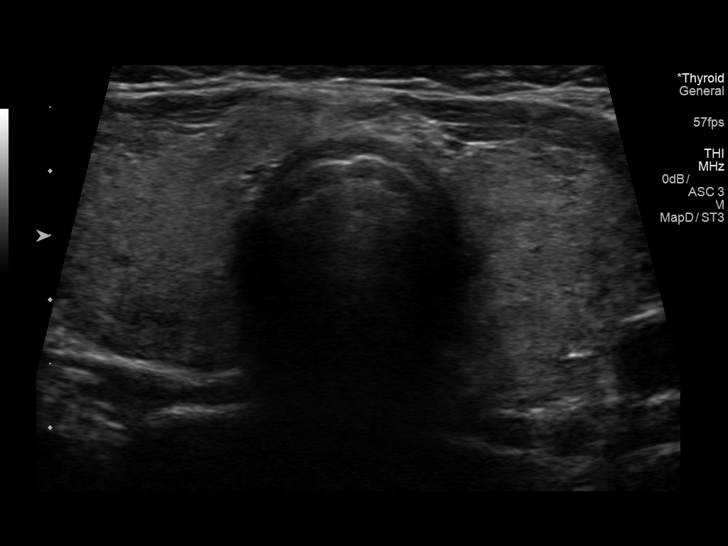
[im 8/89]
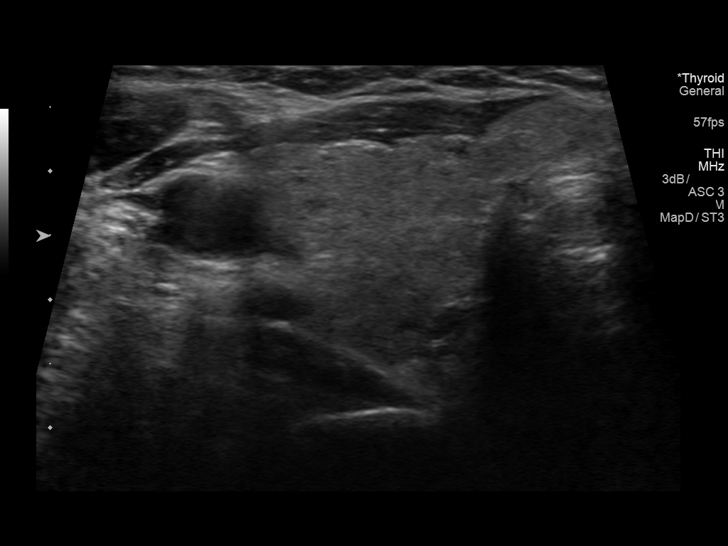
[im 15/89]
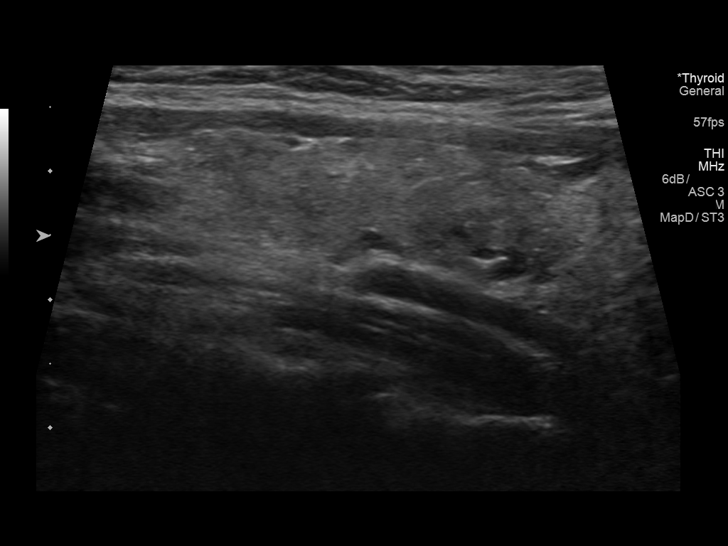
[im 23/89]
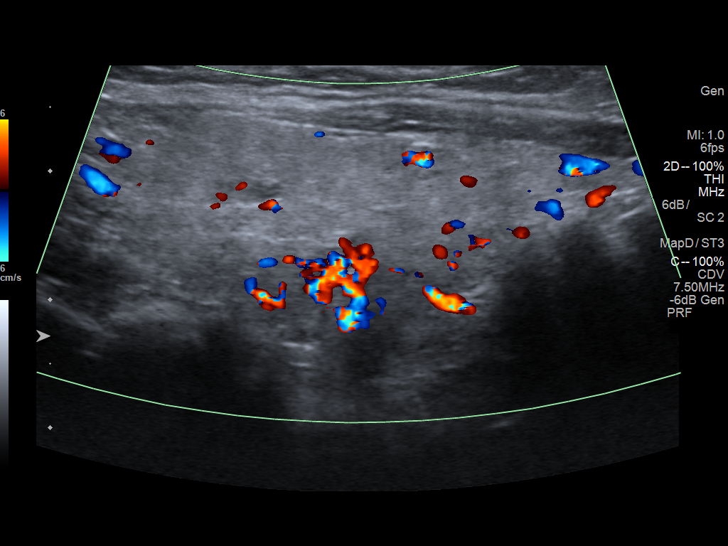
[im 30/89]
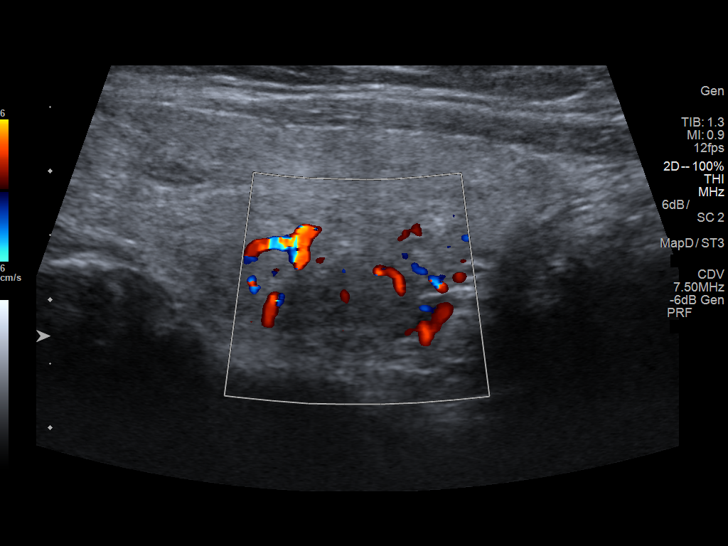
[im 37/89]
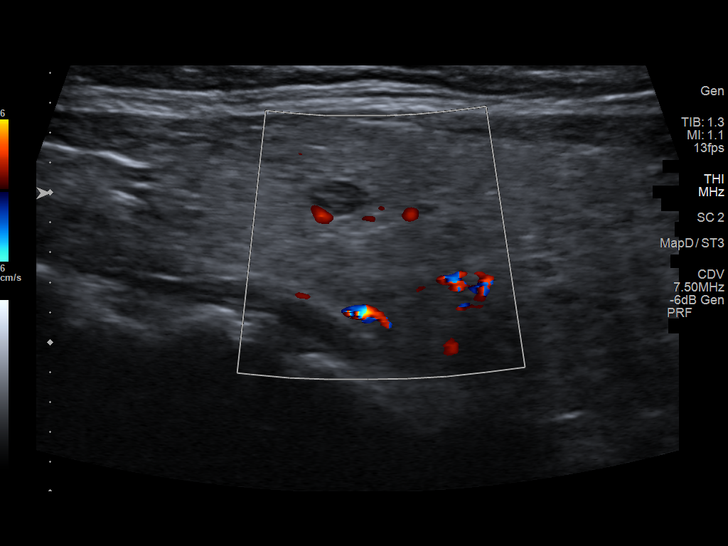
[im 45/89]
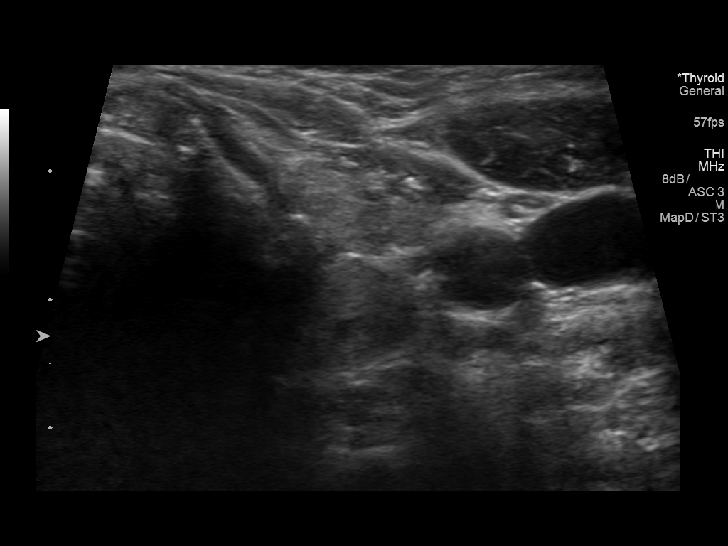
[im 52/89]
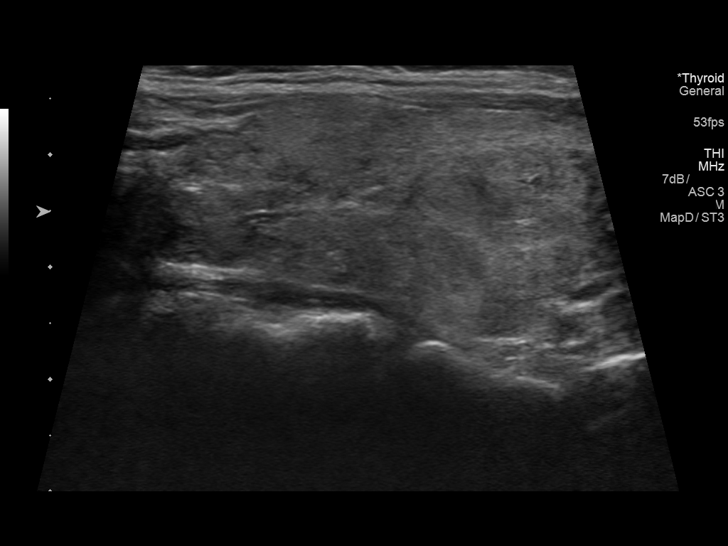
[im 59/89]
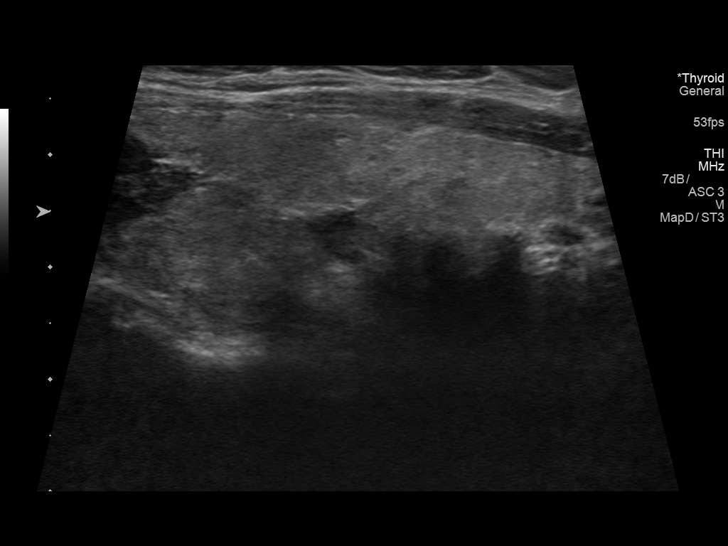
[im 67/89]
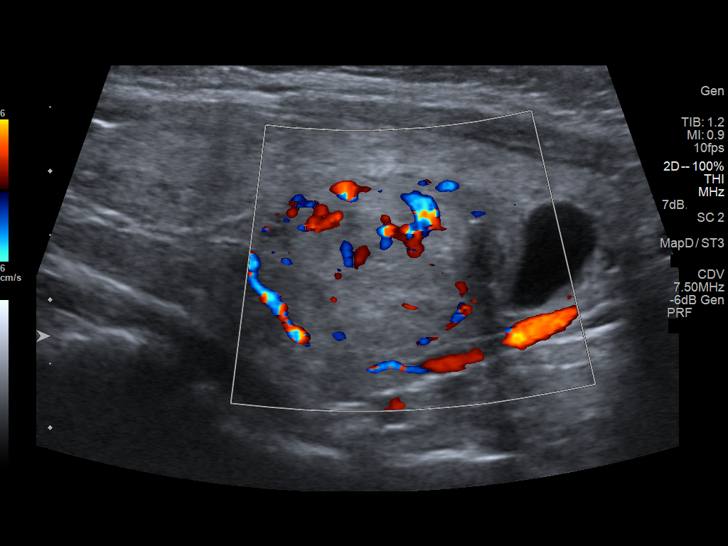
[im 74/89]
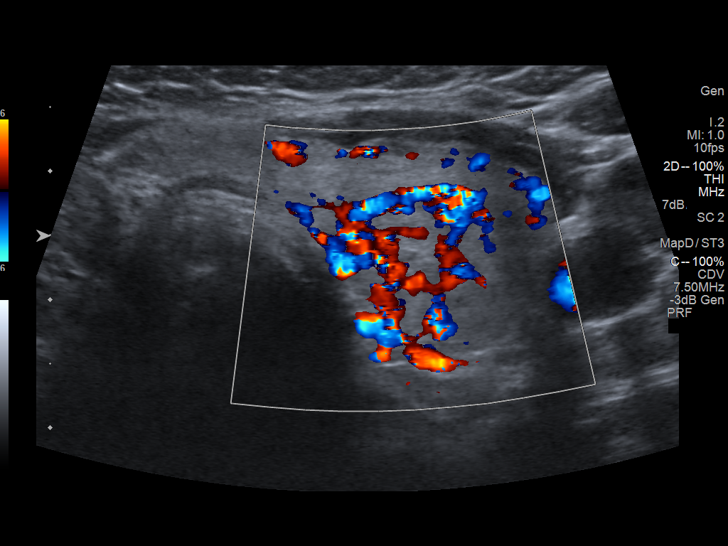
[im 81/89]
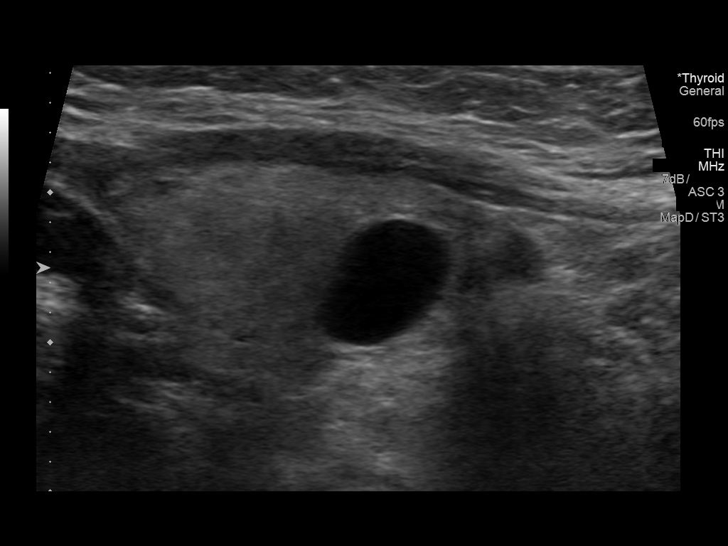
[im 89/89]
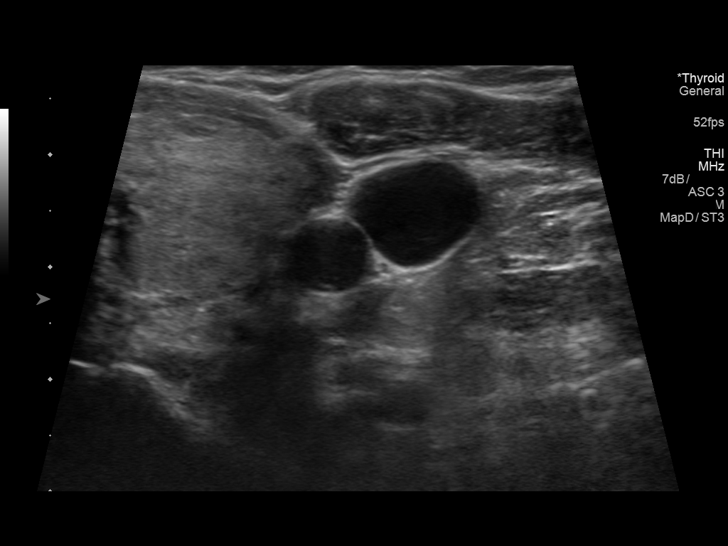

[13 of 25 positions shown; findings below may reference images not displayed]

FINDINGS: Parenchymal Echotexture: Moderately heterogenous

Isthmus: 4 mm

Right lobe: 4.7 x 1.8 x 2.3 cm

Left lobe: 4.7 x 2.1 x 1.9 cm

_________________________________________________________

Estimated total number of nodules >/= 1 cm: 3

Number of spongiform nodules >/=  2 cm not described below (TR1): 0

Number of mixed cystic and solid nodules >/= 1.5 cm not described
below (TR2): 0

_________________________________________________________

Nodule # 1:

Location: Right; Inferior

Maximum size: 1.3 cm; Other 2 dimensions:  0.9 x 0.9 cm

Composition: solid/almost completely solid (2)

Echogenicity: hypoechoic (2)

Shape: not taller-than-wide (0)

Margins: ill-defined (0)

Echogenic foci: none (0)

ACR TI-RADS total points: 4.

ACR TI-RADS risk category: TR4 (4-6 points).

ACR TI-RADS recommendations:

*Given size (>/= 1 - 1.4 cm) and appearance, a follow-up ultrasound
in 1 year should be considered based on TI-RADS criteria.

_________________________________________________________

Nodule # 2:

Location: Left; Mid

Maximum size: 2.0 cm; Other 2 dimensions: 1.5 x 1.6 cm

Composition: solid/almost completely solid (2)

Echogenicity: isoechoic (1)

Shape: not taller-than-wide (0)

Margins: ill-defined (0)

Echogenic foci: macrocalcifications (1)

ACR TI-RADS total points: 4.

ACR TI-RADS risk category: TR4 (4-6 points).

ACR TI-RADS recommendations:

**Given size (>/= 1.5 cm) and appearance, fine needle aspiration of
this moderately suspicious nodule should be considered based on
TI-RADS criteria.

_________________________________________________________

Nodule # 3:

Location: Left; Inferior

Maximum size: 1.0 cm; Other 2 dimensions: 0.7 x 0.8 cm

Composition: cystic/almost completely cystic (0)

Echogenicity: anechoic (0)

Shape: not taller-than-wide (0)

Margins: smooth (0)

Echogenic foci: none (0)

ACR TI-RADS total points: 0.

ACR TI-RADS risk category: TR1 (0-1 points).

ACR TI-RADS recommendations:

This nodule does NOT meet TI-RADS criteria for biopsy or dedicated
follow-up.

_________________________________________________________
IMPRESSION: 2 cm left midpole solid isoechoic nodules (TR 4 nodule). This nodule
meets criteria for biopsy as above.

1.3 cm right inferior solid hypoechoic TR 4 nodule meets criteria
for follow-up in 1 year.

The above is in keeping with the ACR TI-RADS recommendations - [HOSPITAL] 3894;[DATE].

## 2018-11-25 ENCOUNTER — Other Ambulatory Visit: Payer: Self-pay | Admitting: Neurology

## 2018-12-09 ENCOUNTER — Other Ambulatory Visit: Payer: Self-pay | Admitting: Neurology

## 2018-12-31 ENCOUNTER — Other Ambulatory Visit: Payer: Self-pay | Admitting: Neurology

## 2019-01-26 ENCOUNTER — Encounter (INDEPENDENT_AMBULATORY_CARE_PROVIDER_SITE_OTHER): Payer: Self-pay

## 2019-01-27 ENCOUNTER — Ambulatory Visit (INDEPENDENT_AMBULATORY_CARE_PROVIDER_SITE_OTHER): Payer: BLUE CROSS/BLUE SHIELD | Admitting: Family Medicine

## 2019-01-27 ENCOUNTER — Encounter (INDEPENDENT_AMBULATORY_CARE_PROVIDER_SITE_OTHER): Payer: Self-pay | Admitting: Family Medicine

## 2019-01-27 ENCOUNTER — Encounter (INDEPENDENT_AMBULATORY_CARE_PROVIDER_SITE_OTHER): Payer: Self-pay

## 2019-01-27 VITALS — BP 110/73 | HR 75 | Temp 97.7°F | Ht 64.0 in | Wt 211.0 lb

## 2019-01-27 DIAGNOSIS — E559 Vitamin D deficiency, unspecified: Secondary | ICD-10-CM | POA: Diagnosis not present

## 2019-01-27 DIAGNOSIS — F3289 Other specified depressive episodes: Secondary | ICD-10-CM

## 2019-01-27 DIAGNOSIS — Z9189 Other specified personal risk factors, not elsewhere classified: Secondary | ICD-10-CM

## 2019-01-27 DIAGNOSIS — Z6836 Body mass index (BMI) 36.0-36.9, adult: Secondary | ICD-10-CM

## 2019-01-27 MED ORDER — VITAMIN D (ERGOCALCIFEROL) 1.25 MG (50000 UNIT) PO CAPS: 50000.0000 [IU] | ORAL_CAPSULE | ORAL | 0 refills | Status: DC

## 2019-01-27 MED ORDER — BUPROPION HCL ER (SR) 200 MG PO TB12: 200.0000 mg | ORAL_TABLET | Freq: Every day | ORAL | 0 refills | Status: DC

## 2019-01-28 NOTE — Progress Notes (Signed)
Office: [phone]  /  Fax: [phone]   HPI:   Chief Complaint: OBESITY Rachael Camacho is here to discuss her progress with her obesity treatment plan. She is on the portion control better and make smarter food choices, such as increase vegetables and decrease simple carbohydrates and is following her eating plan approximately 0 % of the time. She states she is exercising 0 minutes 0 times per week. Rachael Camacho returned to our clinic after 3-4 months (last visit was 10/28/18). She took a sabbatical secondary to not being in the mind space to commit to a regimented meal plan. Her weight is 211 lb (95.7 kg) today and has gained 4 pounds since her last visit. She has lost 0 lbs since starting treatment with [place].  Vitamin D Deficiency Rachael Camacho has a diagnosis of vitamin D deficiency. She is currently taking prescription Vit D. She notes fatigue and denies nausea, vomiting or muscle weakness.  At risk for osteopenia and osteoporosis Rachael Camacho is at higher risk of osteopenia and osteoporosis due to vitamin D deficiency.   Depression with emotional eating behaviors Rachael Camacho struggles with significant cravings and emotional eating, and using food for comfort to the extent that it is negatively impacting her health. She often snacks when she is not hungry. Rachael Camacho sometimes feels she is out of control and then feels guilty that she made poor food choices. She has been working on behavior modification techniques to help reduce her emotional eating and has been somewhat successful. She shows no sign of suicidal or homicidal ideations.  Depression screen Rachael Camacho 2/9 09/10/2017 10/18/2016 07/18/2016 05/02/2016 03/01/2016  Decreased Interest 2 0 0 0 0  Down, Depressed, Hopeless 2 0 0 0 0  PHQ - 2 Score 4 0 0 0 0  Altered sleeping 1 - - - -  Tired, decreased energy 3 - - - -  Change in appetite 1 - - - -  Feeling bad or failure about yourself  3 - - - -  Trouble concentrating 2 - - - -  Moving slowly or fidgety/restless 2 - -  - -  Suicidal thoughts 0 - - - -  PHQ-9 Score 16 - - - -  Difficult doing work/chores Somewhat difficult - - - -    ASSESSMENT AND PLAN:  Vitamin D deficiency - Plan: Vitamin D, Ergocalciferol, (DRISDOL) 1.25 MG (50000 UT) CAPS capsule  Other depression - with emotional eating  - Plan: buPROPion (WELLBUTRIN SR) 200 MG 12 hr tablet  At risk for osteoporosis  Class 2 severe obesity with serious comorbidity and body mass index (BMI) of 36.0 to 36.9 in adult, unspecified obesity type (HCC)  PLAN:  Vitamin D Deficiency Rachael Camacho was informed that low vitamin D levels contributes to fatigue and are associated with obesity, breast, and colon cancer. Rachael Camacho agrees to continue taking prescription Vit D @50 ,000 IU every week #4 and we will refill for 1 month. She will follow up for routine testing of vitamin D, at least 2-3 times per year. She was informed of the risk of over-replacement of vitamin D and agrees to not increase her dose unless she discusses this with [place] first. Kimley agrees to follow up with our clinic in 2 weeks.  At risk for osteopenia and osteoporosis Rachael Camacho was given extended (15 minutes) osteoporosis prevention counseling today. Rachael Camacho is at risk for osteopenia and osteoporsis due to her vitamin D deficiency. She was encouraged to take her vitamin D and follow her higher calcium diet and increase strengthening exercise to  help strengthen her bones and decrease her risk of osteopenia and osteoporosis.  Depression with Emotional Eating Behaviors We discussed behavior modification techniques today to help Rachael Camacho deal with her emotional eating and depression. Rachael Camacho agrees to continue taking bupropion SR 200 mg PO daily #30 and we will refill for 1 month. Rachael Camacho agrees to follow up with our clinic in 2 weeks.  Obesity Rachael Camacho is currently in the action stage of change. As such, her goal is to continue with weight loss efforts She has agreed to keep a food journal with 1100-1300  calories and 75+ grams of protein daily Rachael Camacho has been instructed to work up to a goal of 150 minutes of combined cardio and strengthening exercise per week for weight loss and overall health benefits. We discussed the following Behavioral Modification Strategies today: increasing lean protein intake, increasing vegetables, work on meal planning and easy cooking plans, emotional eating strategies, better snacking choices, planning for success, and keep a strict food journal Rachael Camacho is to journal 3 out of 7 days. She is going to the store in 3 days.  Rachael Camacho has agreed to follow up with our clinic in 2 weeks. She was informed of the importance of frequent follow up visits to maximize her success with intensive lifestyle modifications for her multiple health conditions.  ALLERGIES: Allergies  Allergen Reactions  . Amoxicillin Other (See Comments)    Immediate yeast infection  Has patient had a PCN reaction causing immediate rash, facial/tongue/throat swelling, SOB or lightheadedness with hypotension: No Has patient had a PCN reaction causing severe rash involving mucus membranes or skin necrosis: No Has patient had a PCN reaction that required hospitalization: No Has patient had a PCN reaction occurring within the last 10 years: No If all of the above answers are "NO", then may proceed with Cephalosporin use.   Rachael Camacho Kitchen Percocet [Oxycodone-Acetaminophen] Itching    Tolerates acetaminophen alone  . [name] [Nitrofurantoin Monohyd Macro] Rash and Hives  . [name] Hives and Rash    Reported chicken pox-like rash, not hives  . Sulfa Antibiotics Rash and Hives    MEDICATIONS: Current Outpatient Medications on File Prior to Visit  Medication Sig Dispense Refill  . baclofen (LIORESAL) 10 MG tablet TAKE 1 TABLET BY MOUTH 3 TIMES DAILY. MUST BE SEEN IN OFFICE FOR REFILLS. CALL [phone] FOR APPOINTMENT. 90 tablet 0  . Biotin 5000 MCG CAPS Take 5,000 mcg by mouth daily.    . Cyanocobalamin  (VITAMIN B-12) 5000 MCG SUBL Take 5,000 mcg by mouth daily.    Rachael Camacho Kitchen desloratadine (CLARINEX) 5 MG tablet Take 5 mg by mouth at bedtime.   12  . diclofenac sodium (VOLTAREN) 1 % GEL Apply 2-4 g topically 4 (four) times daily as needed (for knee pain.).    Rachael Camacho Kitchen fluticasone (FLONASE) 50 MCG/ACT nasal spray Place 1 spray into both nostrils at bedtime. Aller-Flo    . furosemide (LASIX) 20 MG tablet TAKE 1 TABLET (20 MG TOTAL) BY MOUTH DAILY. 90 tablet 3  . gabapentin (NEURONTIN) 100 MG capsule TAKE 1 CAPSULE BY MOUTH THREE TIMES A DAY 90 capsule 2  . levothyroxine (SYNTHROID, LEVOTHROID) 50 MCG tablet Take 25 mcg by mouth daily before breakfast.   6  . macitentan (OPSUMIT) 10 MG tablet Take 1 tablet (10 mg total) by mouth daily. NEEDS FOLLOW UP APPOINTMENT 90 tablet 3  . Multiple Vitamin (MULTIVITAMIN WITH MINERALS) TABS tablet Take 1 tablet by mouth daily.    . Probiotic Product (PROBIOTIC PO) Take 1 capsule by mouth  daily.     No current facility-administered medications on file prior to visit.     PAST MEDICAL HISTORY: Past Medical History:  Diagnosis Date  . Anemia    as child in first grade  . Anxiety   . Arthritis   . Back pain   . Constipation   . CREST syndrome (HCC)   . Dyspnea   . Family history of adverse reaction to anesthesia    mom has had problems in past - N/V post op on one occasion; difficult time waking up on another occasion  . Gallbladder problem   . GERD (gastroesophageal reflux disease)   . Gestational diabetes mellitus in childbirth, diet controlled 1987  . IBS (irritable bowel syndrome)   . Joint pain   . Leg edema   . Menopause   . Osteoarthritis (arthritis due to wear and tear of joints)   . PAH (pulmonary artery hypertension) (HCC)   . Pulmonary fibrosis (HCC)   . Raynaud disease    hands-toes  . Scleroderma (HCC)   . Shortness of breath dyspnea    pulmonary fibrosis-scleraderma  . Sleep apnea   . Swallowing difficulty   . Thyroid cyst   . Vitamin D  deficiency   . Wears glasses     PAST SURGICAL HISTORY: Past Surgical History:  Procedure Laterality Date  . CARDIAC CATHETERIZATION N/A 11/25/2015   Procedure: Right Heart Cath;  Surgeon: Dolores Pattyaniel R Bensimhon, MD;  [facility];  Service: Cardiovascular;  Laterality: N/A;  . CESAREAN SECTION  87,95  . CHOLECYSTECTOMY  1996   lap choli  . COLONOSCOPY    . ESOPHAGOGASTRODUODENOSCOPY (EGD) WITH PROPOFOL N/A 02/12/2017   Procedure: ESOPHAGOGASTRODUODENOSCOPY (EGD) WITH PROPOFOL;  Surgeon: Vida RiggerMarc Magod, MD;  Location: [facility];  Service: Endoscopy;  Laterality: N/A;  . HIATAL HERNIA REPAIR  07/26/2015   Procedure: LAPAROSCOPIC REPAIR OF HIATAL HERNIA;  Surgeon: Luretha MurphyMatthew Krigbaum, MD;  Location: [facility] ORS;  Service: General;;  . LAPAROSCOPIC GASTRIC SLEEVE RESECTION N/A 07/26/2015   Procedure: LAPAROSCOPIC GASTRIC SLEEVE RESECTION;  Surgeon: Luretha MurphyMatthew Lui, MD;  Location: [facility];  Service: General;  Laterality: N/A;  . LEFT AND RIGHT HEART CATHETERIZATION WITH CORONARY ANGIOGRAM N/A 03/14/2015   Procedure: LEFT AND RIGHT HEART CATHETERIZATION WITH CORONARY ANGIOGRAM;  Surgeon: Peter M SwazilandJordan, MD;  [facility];  Service: Cardiovascular;  Laterality: N/A;  . POSTERIOR CERVICAL FUSION/FORAMINOTOMY N/A 01/16/2018   Procedure: Cervical one-two Posterior instrumentation and fusion;  Surgeon: Tressie StalkerJenkins, Jeffrey, MD;  [facility];  Service: Neurosurgery;  Laterality: N/A;  . RADIAL HEAD ARTHROPLASTY Right 07/28/2014   Procedure: RIGHT RADIAL HEAD REPLACEMENT;  Surgeon: Dairl PonderMatthew Weingold, MD;  Location: [facility];  Service: Orthopedics;  Laterality: Right;  . RIGHT HEART CATH N/A 03/18/2017   Procedure: Right Heart Cath;  Surgeon: Dolores Pattyaniel R Bensimhon, MD;  [facility];  Service: Cardiovascular;  Laterality: N/A;  . SAVORY DILATION N/A 02/12/2017   Procedure: SAVORY DILATION;  Surgeon: Vida RiggerMarc Magod, MD;  Location: [facility];  Service: Endoscopy;  Laterality:  N/A;  . TUBAL LIGATION    . UPPER GI ENDOSCOPY  07/26/2015   Procedure: UPPER GI ENDOSCOPY;  Surgeon: Luretha MurphyMatthew Trinidad, MD;  Location: [facility];  Service: General;;  . VAGINAL HYSTERECTOMY  2010   [facility]    SOCIAL HISTORY: Social History   Tobacco Use  . Smoking status: Never Smoker  . Smokeless tobacco: Never Used  Substance Use Topics  . Alcohol use: Yes  Alcohol/week: 0.0 standard drinks    Comment: seldom  . Drug use: No    FAMILY HISTORY: Family History  Problem Relation Age of Onset  . Heart failure Mother        Stent put in 2014 for blockage  . COPD Mother   . Hypertension Mother   . Hyperlipidemia Mother   . Heart disease Mother   . Thyroid disease Mother   . Cancer Mother   . Depression Mother   . Anxiety disorder Mother   . Heart disease Father   . Heart disease Son        Repair ASD  . Neuropathy Neg Hx     ROS: Review of Systems  Constitutional: Positive for malaise/fatigue. Negative for weight loss.  Gastrointestinal: Negative for nausea and vomiting.  Musculoskeletal:       Negative muscle weakness  Psychiatric/Behavioral: Positive for depression. Negative for suicidal ideas.    PHYSICAL EXAM: Blood pressure 110/73, pulse 75, temperature 97.7 F (36.5 C), temperature source Oral, height 5\' 4"  (1.626 m), weight 211 lb (95.7 kg), SpO2 97 %. Body mass index is 36.22 kg/m. Physical Exam Vitals signs reviewed.  Constitutional:      Appearance: Normal appearance. She is obese.  Cardiovascular:     Rate and Rhythm: Normal rate.     Pulses: Normal pulses.  Pulmonary:     Effort: Pulmonary effort is normal.     Breath sounds: Normal breath sounds.  Musculoskeletal: Normal range of motion.  Skin:    General: Skin is warm and dry.  Neurological:     Mental Status: She is alert and oriented to person, place, and time.  Psychiatric:        Mood and Affect: Mood normal.        Behavior: Behavior normal.     RECENT LABS AND TESTS: BMET      Component Value Date/Time   NA 143 10/28/2018 0815   K 4.0 10/28/2018 0815   CL 105 10/28/2018 0815   CO2 24 10/28/2018 0815   GLUCOSE 81 10/28/2018 0815   GLUCOSE 86 01/13/2018 1000   BUN 13 10/28/2018 0815   CREATININE 0.88 10/28/2018 0815   CREATININE 0.75 03/08/2015 1501   CALCIUM 9.3 10/28/2018 0815   GFRNONAA 75 10/28/2018 0815   GFRNONAA >89 03/08/2015 1501   GFRAA 87 10/28/2018 0815   GFRAA >89 03/08/2015 1501   Lab Results  Component Value Date   HGBA1C 4.9 06/25/2018   HGBA1C 4.9 12/10/2017   HGBA1C 5.0 09/10/2017   Lab Results  Component Value Date   INSULIN 8.2 06/25/2018   INSULIN 4.4 12/10/2017   INSULIN 6.1 09/10/2017   CBC    Component Value Date/Time   WBC 6.0 10/03/2018   WBC 6.2 01/13/2018 1000   RBC 4.43 01/13/2018 1000   HGB 13.8 10/03/2018   HGB 13.9 09/10/2017 1108   HCT 41 10/03/2018   HCT 41.6 09/10/2017 1108   PLT 216 10/03/2018   MCV 91.9 01/13/2018 1000   MCV 90 09/10/2017 1108   MCH 30.5 01/13/2018 1000   MCHC 33.2 01/13/2018 1000   RDW 13.4 01/13/2018 1000   RDW 14.1 09/10/2017 1108   LYMPHSABS 2.3 09/10/2017 1108   MONOABS 0.9 07/28/2015 0455   EOSABS 0.2 09/10/2017 1108   BASOSABS 0.0 09/10/2017 1108   Iron/TIBC/Ferritin/ %Sat No results found for: IRON, TIBC, FERRITIN, IRONPCTSAT Lipid Panel     Component Value Date/Time   CHOL 166 10/03/2018   CHOL 159 06/25/2018  1039   TRIG 100 06/25/2018 1039   HDL 68 10/03/2018   HDL 73 06/25/2018 1039   LDLCALC 74 10/03/2018   LDLCALC 66 06/25/2018 1039   Hepatic Function Panel     Component Value Date/Time   PROT 6.4 10/28/2018 0815   ALBUMIN 4.1 10/28/2018 0815   AST 17 10/28/2018 0815   ALT 13 10/28/2018 0815   ALKPHOS 87 10/28/2018 0815   BILITOT 0.4 10/28/2018 0815      Component Value Date/Time   TSH 2.00 10/03/2018   TSH 2.690 09/10/2017 1108      OBESITY BEHAVIORAL INTERVENTION VISIT  Today's visit was # 18   Starting weight: 207 lbs Starting date:  09/10/17 Today's weight : 211 lbs Today's date: 01/27/2019 Total lbs lost to date: 0    ASK: We discussed the diagnosis of obesity with [name] today and Rachael LandAngela agreed to give us permission to discuss obesity behavioral modification therapy today.  ASSESS: Rachael Landngela has the diagnosis of obesity and her BMI today is 36.2 Rachael Landngela is in the action stage of change   ADVISE: Rachael Landngela was educated on the multiple health risks of obesity as well as the benefit of weight loss to improve her health. She was advised of the need for long term treatment and the importance of lifestyle modifications to improve her current health and to decrease her risk of future health problems.  AGREE: Multiple dietary modification options and treatment options were discussed and  Rachael Landngela agreed to follow the recommendations documented in the above note.  ARRANGE: Rachael Landngela was educated on the importance of frequent visits to treat obesity as outlined per CMS and USPSTF guidelines and agreed to schedule her next follow up appointment today.  I, Burt KnackSharon Freid, am acting as transcriptionist for Debbra RidingAlexandria Kadolph, MD  I have reviewed the above documentation for accuracy and completeness, and I agree with the above. - Debbra RidingAlexandria Kadolph, MD

## 2019-02-04 ENCOUNTER — Other Ambulatory Visit: Payer: Self-pay | Admitting: Neurology

## 2019-02-04 MED ORDER — GABAPENTIN 100 MG PO CAPS: ORAL_CAPSULE | ORAL | 0 refills | Status: DC

## 2019-03-17 ENCOUNTER — Telehealth: Payer: Self-pay | Admitting: Neurology

## 2019-03-17 ENCOUNTER — Other Ambulatory Visit: Payer: Self-pay | Admitting: *Deleted

## 2019-03-17 MED ORDER — BACLOFEN 10 MG PO TABS: ORAL_TABLET | ORAL | 0 refills | Status: DC

## 2019-03-17 MED ORDER — GABAPENTIN 100 MG PO CAPS: ORAL_CAPSULE | ORAL | 0 refills | Status: DC

## 2019-03-17 NOTE — Telephone Encounter (Signed)
Pt has called for a refill on her baclofen (LIORESAL) 10 MG tablet gabapentin (NEURONTIN) 100 MG capsule [facility] [phone]

## 2019-03-17 NOTE — Telephone Encounter (Signed)
Refill sent. Pending appt 06/09/2019 @ 10:30 AM.

## 2019-03-20 ENCOUNTER — Telehealth (HOSPITAL_COMMUNITY): Payer: Self-pay

## 2019-03-20 NOTE — Telephone Encounter (Signed)
Prior reauthorization was initiated through [employer] via fax on 03/20/2019.

## 2019-03-30 ENCOUNTER — Other Ambulatory Visit: Payer: Self-pay | Admitting: Otolaryngology

## 2019-03-30 DIAGNOSIS — R590 Localized enlarged lymph nodes: Secondary | ICD-10-CM

## 2019-04-10 ENCOUNTER — Other Ambulatory Visit: Payer: Self-pay

## 2019-04-10 ENCOUNTER — Ambulatory Visit
Admission: RE | Admit: 2019-04-10 | Discharge: 2019-04-10 | Disposition: A | Discharge status: Home / Self Care | Payer: BLUE CROSS/BLUE SHIELD | Source: Ambulatory Visit | Attending: Otolaryngology | Admitting: Otolaryngology

## 2019-04-10 DIAGNOSIS — R590 Localized enlarged lymph nodes: Secondary | ICD-10-CM

## 2019-04-10 MED ORDER — IOPAMIDOL (ISOVUE-300) INJECTION 61%
75.0000 mL | Freq: Once | INTRAVENOUS | Status: AC | PRN
  Administered 2019-04-10: 09:00:00 75 mL via INTRAVENOUS

## 2019-04-16 ENCOUNTER — Encounter (INDEPENDENT_AMBULATORY_CARE_PROVIDER_SITE_OTHER): Payer: Self-pay | Admitting: Family Medicine

## 2019-04-18 IMAGING — US US THYROID
1 series · 13 of 25 positions shown · non-contrast
Comparison: 06/11/2017

CLINICAL DATA: Prior ultrasound follow-up.  Follow-up nodules.

EXAM:
THYROID ULTRASOUND
TECHNIQUE: Ultrasound examination of the thyroid gland and adjacent soft
tissues was performed.

[Series 1: us thyroid · 0.06mm/px · 13 of 59 slices shown]
[im 1/59]
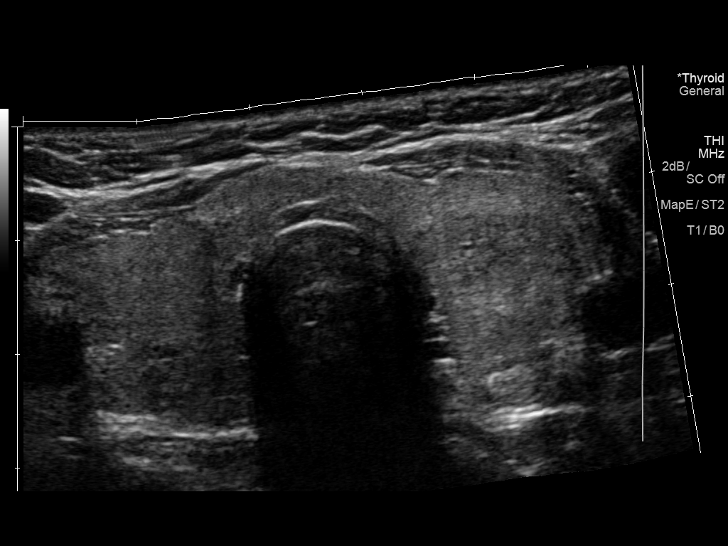
[im 5/59]
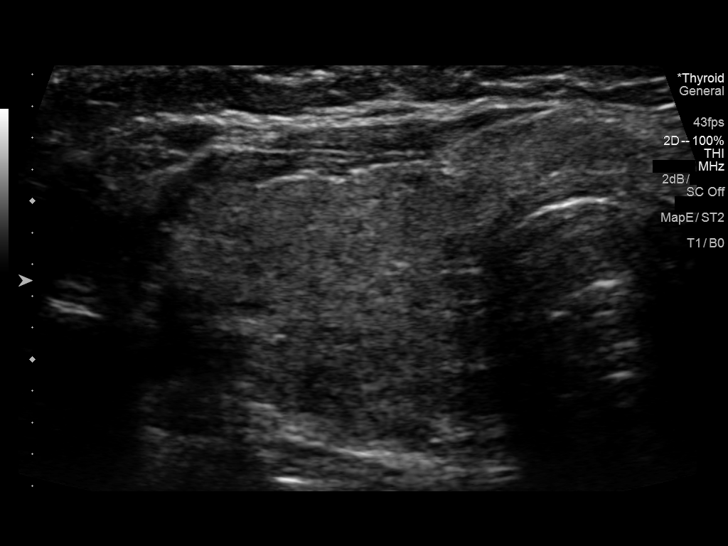
[im 10/59]
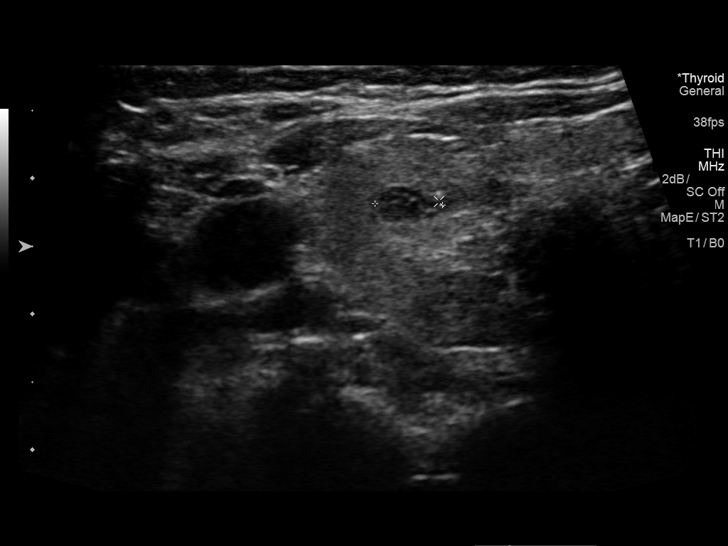
[im 15/59]
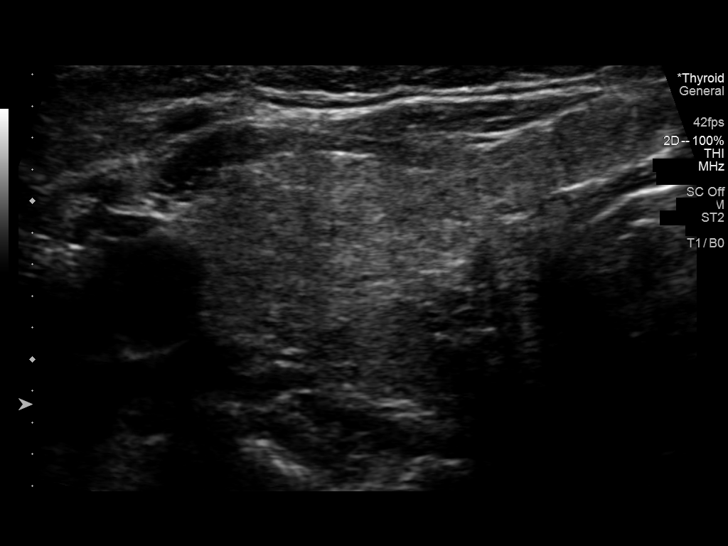
[im 20/59]
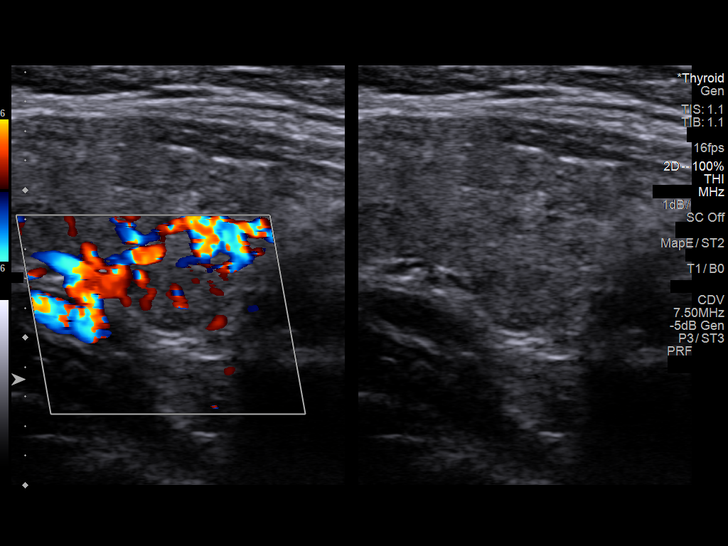
[im 25/59]
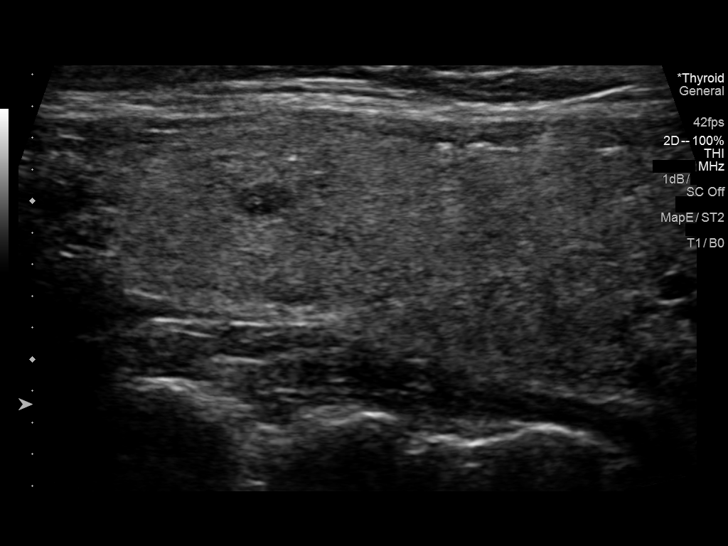
[im 30/59]
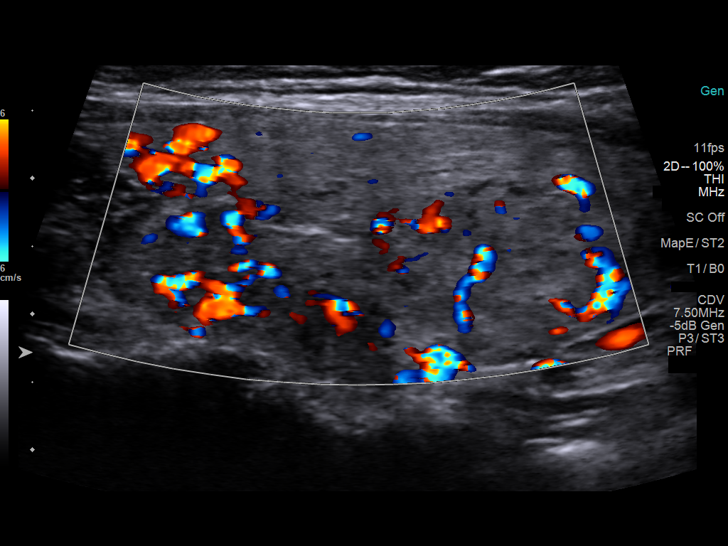
[im 34/59]
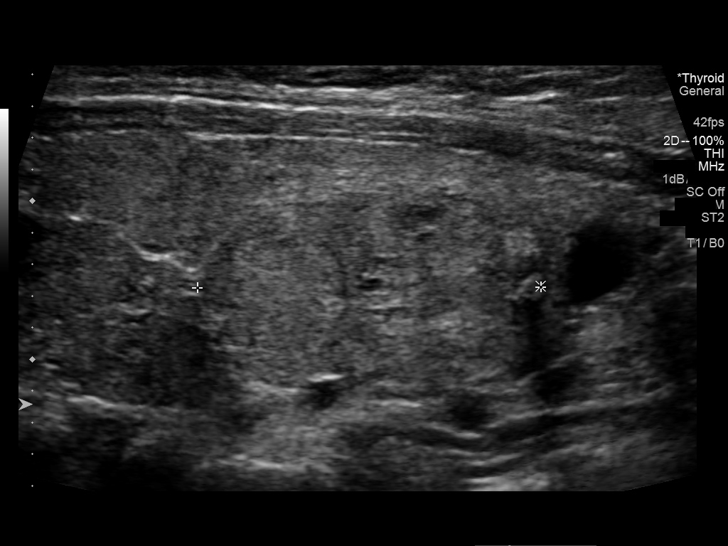
[im 39/59]
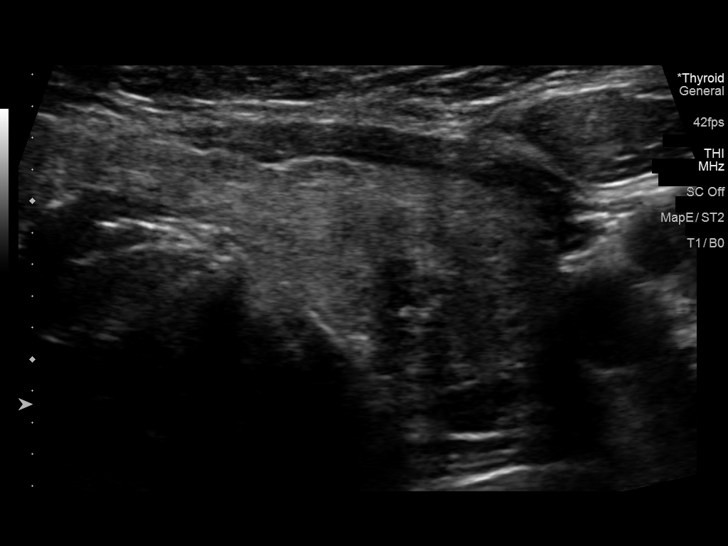
[im 44/59]
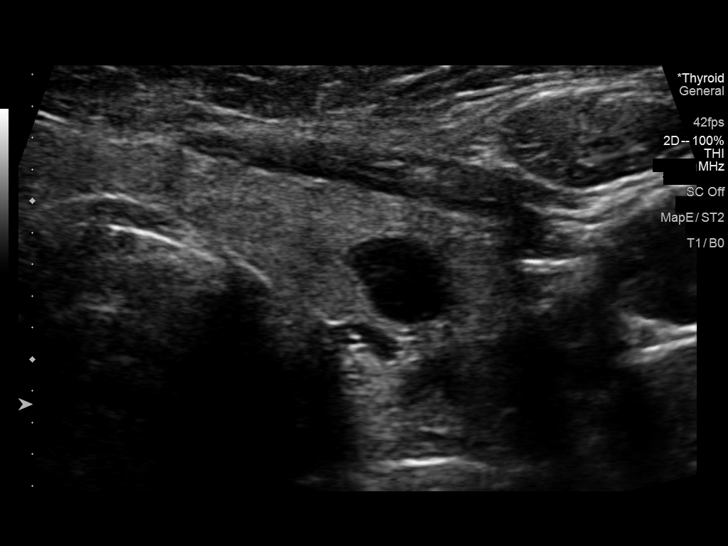
[im 49/59]
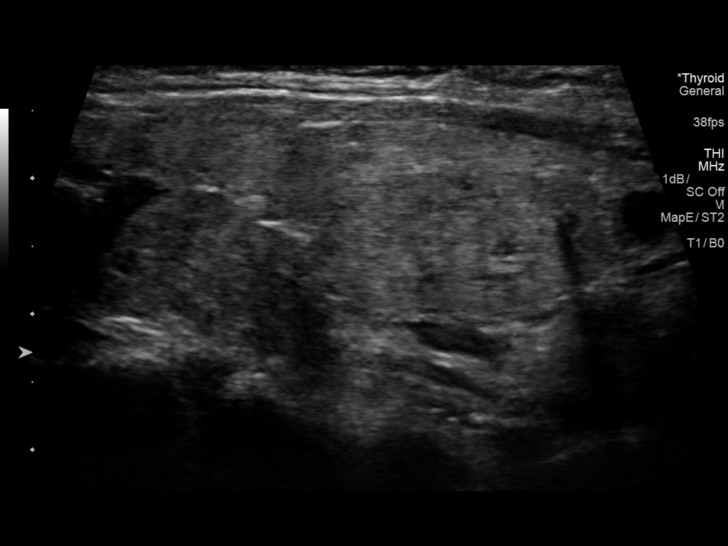
[im 54/59]
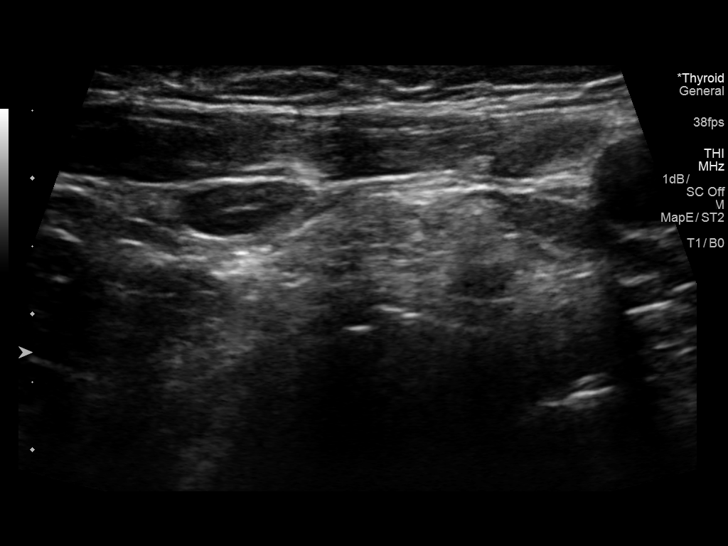
[im 59/59]
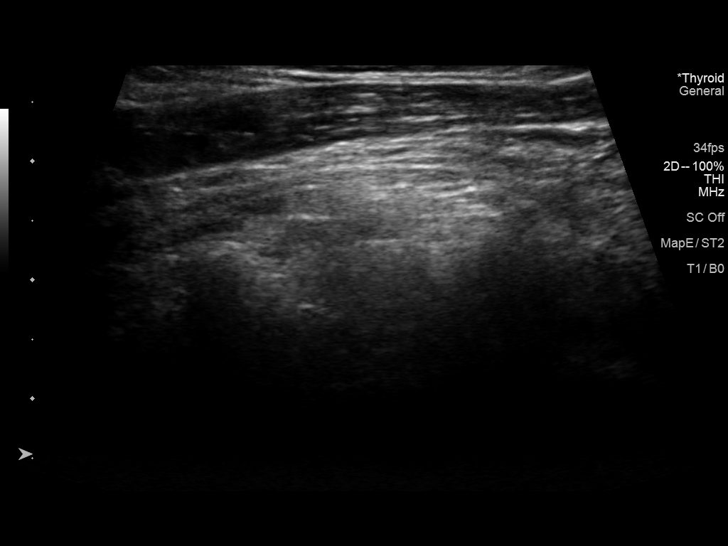

[13 of 25 positions shown; findings below may reference images not displayed]

FINDINGS: Parenchymal Echotexture: Moderately heterogenous

Isthmus: 0.4 cm, previously 0.4 cm

Right lobe: 4.9 x 1.5 x 2.0 cm, previously 4.7 x 1.8 x 2.3 cm

Left lobe: 5.1 x 2.0 x 1.8 cm, previously 4.7 x 2.1 x 1.9 cm

_________________________________________________________

Estimated total number of nodules >/= 1 cm: 2

Number of spongiform nodules >/=  2 cm not described below (TR1): 0

Number of mixed cystic and solid nodules >/= 1.5 cm not described
below (TR2): 0

_________________________________________________________

Nodule # 1:

Prior biopsy: No

Location: Right; Inferior

Maximum size: 1.4 cm; Other 2 dimensions: 0.7 x 0.7 cm, previously,
1.3 x 0.9 x 0.9 cm

Composition: solid/almost completely solid (2)

Echogenicity: hypoechoic (2)

Shape: not taller-than-wide (0)

Margins: smooth (0)

Echogenic foci: none (0)

ACR TI-RADS total points: 4.

ACR TI-RADS risk category:  TR4 (4-6 points).

Significant change in size (>/= 20% in two dimensions and minimal
increase of 2 mm): No

Change in features: No

Change in ACR TI-RADS risk category: No

ACR TI-RADS recommendations:

*Given size (>/= 1 - 1.4 cm) and appearance, a follow-up ultrasound
in 1 year should be considered based on TI-RADS criteria.

_________________________________________________________

Dominant left mid nodule 3 with calcifications measures 2.2 x 1.5 x
1.2 cm and previously measured 2.0 x 1.6 x 1.5 cm. Biopsy was
performed 07/02/2017.

Right lower pole nodule 2 measures 0.7 cm and does not meet criteria
for biopsy or follow-up

Left lower pole cyst measures 0.6 cm and does not meet criteria for
biopsy or follow-up.
IMPRESSION: Right lower pole nodule 1 is not significantly changed and meets
criteria for annual follow-up.

Dominant left mid nodule 3 is not significantly changed and was
previously biopsied. Correlate with prior biopsy results.

Small right nodule and left lobe cyst do not meet criteria for
biopsy nor follow-up.

The above is in keeping with the ACR TI-RADS recommendations - [HOSPITAL] 5189;[DATE].

## 2019-04-21 ENCOUNTER — Encounter (INDEPENDENT_AMBULATORY_CARE_PROVIDER_SITE_OTHER): Payer: Self-pay | Admitting: Family Medicine

## 2019-04-21 ENCOUNTER — Other Ambulatory Visit: Payer: Self-pay

## 2019-04-21 ENCOUNTER — Ambulatory Visit (INDEPENDENT_AMBULATORY_CARE_PROVIDER_SITE_OTHER): Payer: BLUE CROSS/BLUE SHIELD | Admitting: Family Medicine

## 2019-04-21 DIAGNOSIS — E559 Vitamin D deficiency, unspecified: Secondary | ICD-10-CM

## 2019-04-21 DIAGNOSIS — F3289 Other specified depressive episodes: Secondary | ICD-10-CM | POA: Diagnosis not present

## 2019-04-21 DIAGNOSIS — E063 Autoimmune thyroiditis: Secondary | ICD-10-CM | POA: Diagnosis not present

## 2019-04-21 DIAGNOSIS — Z6836 Body mass index (BMI) 36.0-36.9, adult: Secondary | ICD-10-CM

## 2019-04-21 MED ORDER — BUPROPION HCL ER (SR) 200 MG PO TB12: 200.0000 mg | ORAL_TABLET | Freq: Every day | ORAL | 0 refills | Status: DC

## 2019-04-21 MED ORDER — VITAMIN D (ERGOCALCIFEROL) 1.25 MG (50000 UNIT) PO CAPS: 50000.0000 [IU] | ORAL_CAPSULE | ORAL | 0 refills | Status: DC

## 2019-04-22 ENCOUNTER — Encounter (INDEPENDENT_AMBULATORY_CARE_PROVIDER_SITE_OTHER): Payer: Self-pay

## 2019-04-22 NOTE — Progress Notes (Signed)
Office: [phone]  /  Fax: [phone] TeleHealth Visit:  Rachael Camacho has verbally consented to this TeleHealth visit today. The patient is located at home, the provider is located at the [facility] office. The participants in this visit include the listed provider and patient. Rachael Camacho was unable to use Webex today and the telehealth visit was conducted via telephone.  HPI:   Chief Complaint: OBESITY Rachael Camacho is here to discuss her progress with her obesity treatment plan. She is keeping a food journal with 1100 to 1300 calories and 75 + grams of protein and is following her eating plan approximately 0 % of the time. She states she is exercising 0 minutes 0 times per week. Rachael Camacho feels like she has done well maintaining her weight. She is not journaling, but is trying to portion control and make smarter choices.  We were unable to weigh the patient today for this TeleHealth visit. She feels as if she has maintained weight since her last visit. She has lost 0 lbs since starting treatment with us.  Depression with emotional eating behaviors Rachael Camacho had noted increased stress at work previously, but feels this has improved. She is more calm and is not comfort eating as much. She is doing well overall with the COVID19 isolation. She has been working on behavior modification techniques to help reduce her emotional eating and has been somewhat successful. She shows no sign of suicidal or homicidal ideations.  Vitamin D Deficiency Rachael Camacho has a diagnosis of vitamin D deficiency. She has been forgetting to take her vitamin D regularly, so her level has likely dropped. Rachael Camacho denies nausea, vomiting, or muscle weakness.  Hashimoto's Hypothyroid Rachael Camacho has a diagnosis of  Hashimoto's hypothyroidism. She is stable on levothyroxine 50 mcg and she is due for labs in the next 2 months. She denies hot or cold intolerance or palpitations.  ASSESSMENT AND PLAN:  Vitamin D deficiency -  Plan: Comprehensive metabolic panel, VITAMIN D 25 Hydroxy (Vit-D Deficiency, Fractures), Vitamin D, Ergocalciferol, (DRISDOL) 1.25 MG (50000 UT) CAPS capsule  Hashimoto's disease - Plan: T3, T4, free, TSH  Other depression - with emotional eating  - Plan: buPROPion (WELLBUTRIN SR) 200 MG 12 hr tablet  Class 2 severe obesity with serious comorbidity and body mass index (BMI) of 36.0 to 36.9 in adult, unspecified obesity type (HCC)  PLAN:  Vitamin D Deficiency Rachael Camacho was informed that low vitamin D levels contribute to fatigue and are associated with obesity, breast, and colon cancer. Rachael Camacho agrees to continue to take prescription Vit D @50 ,000 IU every week #12 with no refills. She was offered ways to help remember to take her medicine. She will follow up for routine testing of vitamin D, at least 2-3 times per year. She was informed of the risk of over-replacement of vitamin D and agrees to not increase her dose unless she discusses this with us first. We will recheck labs in 2 to 3 months and Rachael Camacho agrees to follow up in 12 weeks as directed.  Hashimoto's Hypothyroid Rachael Camacho was informed of the importance of good thyroid control to help with weight loss efforts. She was also informed that supertherapeutic thyroid levels are dangerous and will not improve weight loss results. We will check labs in the next 2 months and she agrees to continue her current medications for now. Rachael Camacho will follow up at the agreed upon time.  Depression with Emotional Eating Behaviors We discussed behavior modification techniques today to help Rachael Camacho deal with her  emotional eating and depression. She has agreed to take Wellbutrin SR 200 mg qd #90 with no refills and agreed to follow up as directed.  Obesity Andalyn is currently in the action stage of change. As such, her goal is to continue with weight loss efforts. She has agreed to portion control better and make smarter food choices, such as increase vegetables  and decrease simple carbohydrates.  Britni has been instructed to work up to a goal of 150 minutes of combined cardio and strengthening exercise per week for weight loss and overall health benefits. We discussed the following Behavioral Modification Strategies today: work on meal planning and easy cooking plans, keeping healthy foods in the home, and better snacking choices.  Telesa has agreed to follow up with our clinic in 12 weeks. She was informed of the importance of frequent follow up visits to maximize her success with intensive lifestyle modifications for her multiple health conditions.  ALLERGIES: Allergies  Allergen Reactions  . Amoxicillin Other (See Comments)    Immediate yeast infection  Has patient had a PCN reaction causing immediate rash, facial/tongue/throat swelling, SOB or lightheadedness with hypotension: No Has patient had a PCN reaction causing severe rash involving mucus membranes or skin necrosis: No Has patient had a PCN reaction that required hospitalization: No Has patient had a PCN reaction occurring within the last 10 years: No If all of the above answers are "NO", then may proceed with Cephalosporin use.   [name] Percocet [Oxycodone-Acetaminophen] Itching    Tolerates acetaminophen alone  . [name] [Nitrofurantoin Monohyd Macro] Rash and Hives  . [name] Hives and Rash    Reported chicken pox-like rash, not hives  . Sulfa Antibiotics Rash and Hives    MEDICATIONS: Current Outpatient Medications on File Prior to Visit  Medication Sig Dispense Refill  . baclofen (LIORESAL) 10 MG tablet TAKE 1 TABLET BY MOUTH 3 TIMES DAILY. 90 tablet 0  . Biotin 5000 MCG CAPS Take 5,000 mcg by mouth daily.    . Cyanocobalamin (VITAMIN B-12) 5000 MCG SUBL Take 5,000 mcg by mouth daily.    [name] desloratadine (CLARINEX) 5 MG tablet Take 5 mg by mouth at bedtime.   12  . diclofenac sodium (VOLTAREN) 1 % GEL Apply 2-4 g topically 4 (four) times daily as needed (for knee pain.).     [name] fluticasone (FLONASE) 50 MCG/ACT nasal spray Place 1 spray into both nostrils at bedtime. Aller-Flo    . furosemide (LASIX) 20 MG tablet TAKE 1 TABLET (20 MG TOTAL) BY MOUTH DAILY. 90 tablet 3  . gabapentin (NEURONTIN) 100 MG capsule TAKE 1 CAPSULE BY MOUTH THREE TIMES A DAY 90 capsule 0  . levothyroxine (SYNTHROID, LEVOTHROID) 50 MCG tablet Take 25 mcg by mouth daily before breakfast.   6  . macitentan (OPSUMIT) 10 MG tablet Take 1 tablet (10 mg total) by mouth daily. NEEDS FOLLOW UP APPOINTMENT 90 tablet 3  . Multiple Vitamin (MULTIVITAMIN WITH MINERALS) TABS tablet Take 1 tablet by mouth daily.    . Probiotic Product (PROBIOTIC PO) Take 1 capsule by mouth daily.     No current facility-administered medications on file prior to visit.     PAST MEDICAL HISTORY: Past Medical History:  Diagnosis Date  . Anemia    as child in first grade  . Anxiety   . Arthritis   . Back pain   . Constipation   . CREST syndrome (HCC)   . Dyspnea   . Family history of adverse reaction to anesthesia  mom has had problems in past - N/V post op on one occasion; difficult time waking up on another occasion  . Gallbladder problem   . GERD (gastroesophageal reflux disease)   . Gestational diabetes mellitus in childbirth, diet controlled 1987  . IBS (irritable bowel syndrome)   . Joint pain   . Leg edema   . Menopause   . Osteoarthritis (arthritis due to wear and tear of joints)   . PAH (pulmonary artery hypertension) (HCC)   . Pulmonary fibrosis (HCC)   . Raynaud disease    hands-toes  . Scleroderma (HCC)   . Shortness of breath dyspnea    pulmonary fibrosis-scleraderma  . Sleep apnea   . Swallowing difficulty   . Thyroid cyst   . Vitamin D deficiency   . Wears glasses     PAST SURGICAL HISTORY: Past Surgical History:  Procedure Laterality Date  . CARDIAC CATHETERIZATION N/A 11/25/2015   Procedure: Right Heart Cath;  Surgeon: Dolores Patty, MD;  Location: [facility] INVASIVE CV LAB;   Service: Cardiovascular;  Laterality: N/A;  . CESAREAN SECTION  87,95  . CHOLECYSTECTOMY  1996   lap choli  . COLONOSCOPY    . ESOPHAGOGASTRODUODENOSCOPY (EGD) WITH PROPOFOL N/A 02/12/2017   Procedure: ESOPHAGOGASTRODUODENOSCOPY (EGD) WITH PROPOFOL;  Surgeon: Vida Rigger, MD;  Location: [facility] ENDOSCOPY;  Service: Endoscopy;  Laterality: N/A;  . HIATAL HERNIA REPAIR  07/26/2015   Procedure: LAPAROSCOPIC REPAIR OF HIATAL HERNIA;  Surgeon: Luretha Murphy, MD;  Location: [facility] ORS;  Service: General;;  . LAPAROSCOPIC GASTRIC SLEEVE RESECTION N/A 07/26/2015   Procedure: LAPAROSCOPIC GASTRIC SLEEVE RESECTION;  Surgeon: Luretha Murphy, MD;  Location: [facility] ORS;  Service: General;  Laterality: N/A;  . LEFT AND RIGHT HEART CATHETERIZATION WITH CORONARY ANGIOGRAM N/A 03/14/2015   Procedure: LEFT AND RIGHT HEART CATHETERIZATION WITH CORONARY ANGIOGRAM;  Surgeon: Peter M Swaziland, MD;  Location: [facility] CATH LAB;  Service: Cardiovascular;  Laterality: N/A;  . POSTERIOR CERVICAL FUSION/FORAMINOTOMY N/A 01/16/2018   Procedure: Cervical one-two Posterior instrumentation and fusion;  Surgeon: Tressie Stalker, MD;  Location: [facility] OR;  Service: Neurosurgery;  Laterality: N/A;  . RADIAL HEAD ARTHROPLASTY Right 07/28/2014   Procedure: RIGHT RADIAL HEAD REPLACEMENT;  Surgeon: Dairl Ponder, MD;  Location: [facility];  Service: Orthopedics;  Laterality: Right;  . RIGHT HEART CATH N/A 03/18/2017   Procedure: Right Heart Cath;  Surgeon: Dolores Patty, MD;  Location: [facility] INVASIVE CV LAB;  Service: Cardiovascular;  Laterality: N/A;  . SAVORY DILATION N/A 02/12/2017   Procedure: SAVORY DILATION;  Surgeon: Vida Rigger, MD;  Location: [facility] ENDOSCOPY;  Service: Endoscopy;  Laterality: N/A;  . TUBAL LIGATION    . UPPER GI ENDOSCOPY  07/26/2015   Procedure: UPPER GI ENDOSCOPY;  Surgeon: Luretha Murphy, MD;  Location: [facility] ORS;  Service: General;;  . VAGINAL HYSTERECTOMY  2010   LAVH    SOCIAL HISTORY: Social History   Tobacco Use   . Smoking status: Never Smoker  . Smokeless tobacco: Never Used  Substance Use Topics  . Alcohol use: Yes    Alcohol/week: 0.0 standard drinks    Comment: seldom  . Drug use: No    FAMILY HISTORY: Family History  Problem Relation Age of Onset  . Heart failure Mother        Stent put in 2014 for blockage  . COPD Mother   . Hypertension Mother   . Hyperlipidemia Mother   . Heart disease Mother   . Thyroid disease Mother   . Cancer  Mother   . Depression Mother   . Anxiety disorder Mother   . Heart disease Father   . Heart disease Son        Repair ASD  . Neuropathy Neg Hx     ROS: Review of Systems  Cardiovascular: Negative for palpitations.  Gastrointestinal: Negative for nausea and vomiting.  Musculoskeletal:       Negative for muscle weakness.  Endo/Heme/Allergies:       Negative for heat intolerance. Negative for cold intolerance.  Psychiatric/Behavioral: Positive for depression. Negative for suicidal ideas.       Negative for homicidal ideations.    PHYSICAL EXAM: Pt in no acute distress  RECENT LABS AND TESTS: BMET    Component Value Date/Time   NA 143 10/28/2018 0815   K 4.0 10/28/2018 0815   CL 105 10/28/2018 0815   CO2 24 10/28/2018 0815   GLUCOSE 81 10/28/2018 0815   GLUCOSE 86 01/13/2018 1000   BUN 13 10/28/2018 0815   CREATININE 0.88 10/28/2018 0815   CREATININE 0.75 03/08/2015 1501   CALCIUM 9.3 10/28/2018 0815   GFRNONAA 75 10/28/2018 0815   GFRNONAA >89 03/08/2015 1501   GFRAA 87 10/28/2018 0815   GFRAA >89 03/08/2015 1501   Lab Results  Component Value Date   HGBA1C 4.9 06/25/2018   HGBA1C 4.9 12/10/2017   HGBA1C 5.0 09/10/2017   Lab Results  Component Value Date   INSULIN 8.2 06/25/2018   INSULIN 4.4 12/10/2017   INSULIN 6.1 09/10/2017   CBC    Component Value Date/Time   WBC 6.0 10/03/2018   WBC 6.2 01/13/2018 1000   RBC 4.43 01/13/2018 1000   HGB 13.8 10/03/2018   HGB 13.9 09/10/2017 1108   HCT 41 10/03/2018   HCT  41.6 09/10/2017 1108   PLT 216 10/03/2018   MCV 91.9 01/13/2018 1000   MCV 90 09/10/2017 1108   MCH 30.5 01/13/2018 1000   MCHC 33.2 01/13/2018 1000   RDW 13.4 01/13/2018 1000   RDW 14.1 09/10/2017 1108   LYMPHSABS 2.3 09/10/2017 1108   MONOABS 0.9 07/28/2015 0455   EOSABS 0.2 09/10/2017 1108   BASOSABS 0.0 09/10/2017 1108   Iron/TIBC/Ferritin/ %Sat No results found for: IRON, TIBC, FERRITIN, IRONPCTSAT Lipid Panel     Component Value Date/Time   CHOL 166 10/03/2018   CHOL 159 06/25/2018 1039   TRIG 100 06/25/2018 1039   HDL 68 10/03/2018   HDL 73 06/25/2018 1039   LDLCALC 74 10/03/2018   LDLCALC 66 06/25/2018 1039   Hepatic Function Panel     Component Value Date/Time   PROT 6.4 10/28/2018 0815   ALBUMIN 4.1 10/28/2018 0815   AST 17 10/28/2018 0815   ALT 13 10/28/2018 0815   ALKPHOS 87 10/28/2018 0815   BILITOT 0.4 10/28/2018 0815      Component Value Date/Time   TSH 2.00 10/03/2018   TSH 2.690 09/10/2017 1108   Results for KEYLAH, DARWISH (MRN [id number]) as of 04/22/2019 08:48  Ref. Range 10/28/2018 08:15  Vitamin D, 25-Hydroxy Latest Ref Range: 30.0 - 100.0 ng/mL 45.5     I, Kirke Corin, CMA, am acting as transcriptionist for Wilder Glade, MD I have reviewed the above documentation for accuracy and completeness, and I agree with the above. -Quillian Quince, MD

## 2019-05-26 ENCOUNTER — Telehealth (HOSPITAL_COMMUNITY): Payer: Self-pay

## 2019-05-26 ENCOUNTER — Other Ambulatory Visit: Payer: Self-pay | Admitting: Neurology

## 2019-05-26 NOTE — Telephone Encounter (Signed)
Pt left voicemail stating that her job will return her and other employees back to their job site.  She is concerned about her PAH and is asking for a letter to extend her work from home time until 1st week of July. Please advise  Routed to Dr Gala Romney

## 2019-05-27 ENCOUNTER — Other Ambulatory Visit: Payer: Self-pay

## 2019-05-28 ENCOUNTER — Encounter (HOSPITAL_COMMUNITY): Payer: Self-pay

## 2019-05-28 MED ORDER — BACLOFEN 10 MG PO TABS: ORAL_TABLET | ORAL | 0 refills | Status: DC

## 2019-05-28 NOTE — Telephone Encounter (Signed)
Letter written and mailed. Pt made aware of same

## 2019-05-28 NOTE — Telephone Encounter (Signed)
Ok to write a letter saying that she is at high risk for serious morbidity and mortality due to her underlying heart condition and she should be out of work for at least another month until COVID restrictions relaxed further.

## 2019-06-04 ENCOUNTER — Telehealth: Payer: Self-pay | Admitting: *Deleted

## 2019-06-04 ENCOUNTER — Encounter: Payer: Self-pay | Admitting: *Deleted

## 2019-06-04 NOTE — Telephone Encounter (Signed)
Spoke with pt on phone. Pt r/s to Amy NP per provider. Pt appt on Tues 6/16 @ 10:30 AM. Discussed d/t COVID19 our office is doing virtual visits to reduce the risks to our patients and staff. Pt understands that although there may be some limitations with this type of visit, we will take all precautions to reduce any security or privacy concerns.  Pt understands that this will be treated like an in office visit and we will file with pt's insurance, and there may be a patient responsible charge related to this service. Pt will use her smart phone [phone] ([employer]izon). Pt understands she will receive a call at 10:00 AM to check-in and then we ask that she join the meeting around 10:20-10:25. Pt understands to enter her first & last name into the welcome window after clicking the link. Pt verbalized appreciation and understand to let us know if she is unable to locate a link.   Sent link to pt's phone [phone]

## 2019-06-09 ENCOUNTER — Telehealth (INDEPENDENT_AMBULATORY_CARE_PROVIDER_SITE_OTHER): Payer: BC Managed Care – PPO | Admitting: Family Medicine

## 2019-06-09 ENCOUNTER — Encounter: Payer: Self-pay | Admitting: Family Medicine

## 2019-06-09 ENCOUNTER — Ambulatory Visit: Payer: BLUE CROSS/BLUE SHIELD | Admitting: Neurology

## 2019-06-09 DIAGNOSIS — G500 Trigeminal neuralgia: Secondary | ICD-10-CM | POA: Diagnosis not present

## 2019-06-09 MED ORDER — GABAPENTIN 100 MG PO CAPS: ORAL_CAPSULE | ORAL | 0 refills | Status: DC

## 2019-06-09 MED ORDER — BACLOFEN 10 MG PO TABS: ORAL_TABLET | ORAL | 0 refills | Status: DC

## 2019-06-09 NOTE — Progress Notes (Addendum)
PATIENT: Rachael Camacho DOB: 1966/10/05  REASON FOR VISIT: follow up HISTORY FROM: patient  Virtual Visit via Telephone Note  I connected with Rachael SaverAngela D Mickley on 06/09/19 at 10:30 AM EDT by telephone and verified that I am speaking with the correct person using two identifiers.   I discussed the limitations, risks, security and privacy concerns of performing an evaluation and management service by telephone and the availability of in person appointments. I also discussed with the patient that there may be a patient responsible charge related to this service. The patient expressed understanding and agreed to proceed.   History of Present Illness:  06/09/19 Rachael Saverngela D Panella is a 53 y.o. female here today for follow up of trigeminal neuralgia.  She reports that she is doing well.  She does take gabapentin 100 mg as well as baclofen 10 mg every night.  She can tell a significant difference in her pain levels if she misses her medications.  She does not feel that she can tolerate an increased dose due to grogginess.  She is tolerating medications well without obvious adverse effects.  She does continue to have mild numbness and tingling in the right first, second, third and fourth digit.  She noticed this post neck surgery.  Nerve conduction study was normal.  She feels that it is manageable at this time.  History (copied from Dr Trevor MaceAhern's note on 03/17/2018)  Interval history 03/17/2018: Patient her for follow up of trigeminal neuralgia. 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 She feels better, she has improved with the trigeminal pain, she has occasional neuralgia but nothing like before. She continues to have right hand numbness and tingling, she is getting an emg/ncs. Started after surgery this is new and continues. She will be going to PT in a few months after healing. Discussed surgery, physical therapy in the future. Discussed any further treatment of  trigeminal pain, she takes gabapentin and baclofen. Taking them more often made her "loopy". She increased her Gabaoentin to 300mg  which did not help with the tingling at all. Overall improved. Discussed Trigeminal neuralgia was likely due to the c1-c2 lesion as this is where the trigeminal nucleus resides.   HPI:  Rachael Saverngela D Ahola is a 53 y.o. female here as a referral from Dr. Tenny Crawoss for headache. PMHx scleroderma, CREST, Raynaud, pulmonary fibrosis, PAH.  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 Last February she yawned and it popped and she went to bed and had a horrible headache, they didn't see anything on exam, walk-in clinic saw nothing, she saw ENT. She saw Dr. Dorma RussellKraus who discovered she had arthritis, she tried to see a TMJ expert, she saw an oral surgeon and he couldn't do anything. When she yawns it hurts. She has shooting, intense pain or just an ache in the middle of the night. She has a mouth guard but this worsens the pain. Triggers include sudden movements. Even rubbing that side makes it worse. Movements make it worse. Brief. No autonomic symptoms, happens 20x a day.  No weakness of the face or any other symptoms. No other focal neurologic deficits, associated symptoms, inciting events or modifiable factors.  Reviewed notes, labs and imaging from outside physicians, which showed:  CMP/CBC normal  CT Temporal bone 04/2017:  FINDINGS: Right ear: The external auditory canal is normal.The mastoids are well aerated with no fluid or opacification.The middle ear  cavity is normal with no fluid or soft tissue mass.The ossicles are intact without disruption or erosion.The inner ear  structures are normal with no congenital malformation or bony demineralization.The carotid canal and jugular foramen regions are normal.  Left ear: The external auditory canal is normal.The mastoids are well aerated with no fluid or  opacification.The middle ear cavity is normal with no fluid or soft tissue mass.The ossicles are intact without disruption or erosion.The inner ear  structures are normal with no congenital malformation or bony demineralization.The carotid canal and jugular foramen regions are normal.  #Paranasal sinuses: Clear #Nasopharynx: Normal #Intracranial structures: No abnormality seen. #Temporomandibular joints: There is mild irregularity of the articular surface of the right mandibular condyle with a small degenerative cyst in the mandibular condyle. Consistent with mild osteoarthritis. The left temporomandibular joint is normal. #Additional findings: There are elongated styloid processes and calcified stylohyoid ligaments bilaterally. The right styloid process/mylohyoid ligament measures 3.1 cm. The left styloid process/calcified stylohyoid ligament measures at least 3.7 cm  but is incompletely imaged. These findings can be seen with Eagle's syndrome which can cause ear pain.    Observations/Objective:  Generalized: Well developed, in no acute distress  Mentation: Alert oriented to time, place, history taking. Follows all commands speech and language fluent   Assessment and Plan:  53 y.o. year old female  has a past medical history of Anemia, Anxiety, Arthritis, Back pain, Constipation, CREST syndrome (HCC), Dyspnea, Family history of adverse reaction to anesthesia, Gallbladder problem, GERD (gastroesophageal reflux disease), Gestational diabetes mellitus in childbirth, diet controlled (1987), IBS (irritable bowel syndrome), Joint pain, Leg edema, Menopause, Osteoarthritis (arthritis due to wear and tear of joints), PAH (pulmonary artery hypertension) (HCC), Pulmonary fibrosis (HCC), Raynaud disease, Scleroderma (HCC), Shortness of breath dyspnea, Sleep apnea, Swallowing difficulty, Thyroid cyst, Vitamin D deficiency, and Wears glasses. here with    ICD-10-CM   1. Trigeminal  neuralgia of right side of face  G50.0    She feels that her trigeminal neuralgia is well managed on gabapentin 100 mg and baclofen 10 mg once daily at bedtime.  We will continue this therapy.  She was instructed that she may increase dose as tolerated if needed.  We will continue to monitor numbness in fingertips.  She was advised to follow-up with us annually.  She verbalizes understanding and agreement with this plan.  No orders of the defined types were placed in this encounter.   Meds ordered this encounter  Medications   baclofen (LIORESAL) 10 MG tablet    Sig: TAKE 1 TABLET BY MOUTH 3 TIMES DAILY.    Dispense:  90 tablet    Refill:  0    Patient must keep appt 06/09/19 for any future refills    Order Specific Question:   Supervising Provider    Answer:   Anson FretHERN, ANTONIA B [[id number]]   gabapentin (NEURONTIN) 100 MG capsule    Sig: TAKE 1 CAPSULE BY MOUTH THREE TIMES A DAY    Dispense:  90 capsule    Refill:  0    Order Specific Question:   Supervising Provider    Answer:   Anson FretHERN, ANTONIA B [[id number]]     Follow Up Instructions:  I discussed the assessment and treatment plan with the patient. The patient was provided an opportunity to ask questions and all were answered. The patient agreed with the plan and demonstrated an understanding of the instructions.   The patient was advised to call back or seek an in-person evaluation if the  symptoms worsen or if the condition fails to improve as anticipated.  I provided 20 minutes of non-face-to-face time during this encounter. Patient is located at her place of residence during video conference.  Provider is located in the office.  Liane Comber, RN helped to facilitate visit.   Debbora Presto, NP   Made any corrections needed, and agree with history, physical, neuro exam,assessment and plan as stated.    Sarina Ill, MD

## 2019-06-18 ENCOUNTER — Telehealth (HOSPITAL_COMMUNITY): Payer: Self-pay

## 2019-06-18 NOTE — Telephone Encounter (Signed)
Received message from [facility] [place] that PA for [employer]um[employer] was expiring soon.  Called [facility], med does not require PA.

## 2019-06-25 ENCOUNTER — Telehealth (HOSPITAL_COMMUNITY): Payer: Self-pay

## 2019-06-25 NOTE — Telephone Encounter (Signed)
PA submitted for Opsumit via CMM. Awaiting response.

## 2019-07-01 NOTE — Telephone Encounter (Signed)
Received fax from [facility] needing additional information to complete PA.  Called [facility] and gave information to complete PA verbally.  Decision will come in about 24hrs.

## 2019-07-01 NOTE — Telephone Encounter (Signed)
Prior authorization through [employer] was APPROVED for Opsumit and will expire on 06/30/2020.

## 2019-07-27 ENCOUNTER — Other Ambulatory Visit (HOSPITAL_COMMUNITY): Payer: Self-pay | Admitting: Internal Medicine

## 2019-08-13 ENCOUNTER — Other Ambulatory Visit (HOSPITAL_COMMUNITY): Payer: Self-pay | Admitting: Internal Medicine

## 2019-08-17 NOTE — Progress Notes (Addendum)
 @Patient  ID: Rachael SaverAngela D Puchalski, female    DOB: [birth year], 53 y.o.   MRN: [id number]  Chief Complaint  Patient presents with  . Follow-up    Not seen here since 2018 - SOB may be a little worse since then    Referring provider: Daisy Florooss, Charles Alan, MD  HPI: 53 year old female, never smoker. PMH OSA, pulmonary hypertension, CREST syndrome, trigeminal neuralgia, obesity. Patient of Dr. Vassie LollAlva, last seen on 04/23/17. Ok to discontinue oxygen. Referred to pulmonary rehab. Tolerating macintentan, may need to add PDE inhibitor. Encourage patient take lasix three times a week. Follows with rheumatology and cardiology.   08/18/2019 Patient presents today for regular follow-up. She feels well, a little more deconditioned than last year. She did not attend pulmonary rehab. No real significant trouble breathing. She had a normal CXR and echocardiogram with rheumatology recently. She was also started on Norvasc 10mg . She continues Opsumit prescribed by cardiology. She is currently lasix 20mg  daily. Denies chest pain or wheezing.    Significant tests/ events 01/2015 saturation about 93%-on walking she desaturate to 84%  Home sleep study 02/2015>>AHI 10.6, SaO2 low 70%. She spent 329.7 min (84.7% of test time) with SaO2 <90%. Consistent with mild sleep apnea   Right heart cath >>02/2015 -right atrial pressure 10, pulmonary capillary wedge pressure 14 and PA pressure 50/19. PVR calculates to 2.7 WU (normal range). PA pressures are up due to high output and pulmonary vasodilators not indicated.   HST 08/2015 >>low AHI, desatn >stay on 2L O2  RHC 11/2015 PVR 2.8 Wu  PFT 11/2015 DLCO 58%  PFTs 01/2017  showed lung volumes preserved, DLCO stable at 60%  Repeat right heart cath showed PA pressure in the 60s, PVR increased to 5.3 WU  HRCT 2016 showed mil centrilobular groundglass and dilated main pulmonary artery.    Allergies  Allergen Reactions  . Amoxicillin Other (See Comments)    Immediate  yeast infection  Has patient had a PCN reaction causing immediate rash, facial/tongue/throat swelling, SOB or lightheadedness with hypotension: No Has patient had a PCN reaction causing severe rash involving mucus membranes or skin necrosis: No Has patient had a PCN reaction that required hospitalization: No Has patient had a PCN reaction occurring within the last 10 years: No If all of the above answers are "NO", then may proceed with Cephalosporin use.   [name]. Percocet [Oxycodone-Acetaminophen] Itching    Tolerates acetaminophen alone  . [name] [Nitrofurantoin Monohyd Macro] Rash and Hives  . [name] Hives and Rash    Reported chicken pox-like rash, not hives  . Sulfa Antibiotics Rash and Hives    Immunization History  Administered Date(s) Administered  . Influenza Inj Mdck Quad Pf 09/24/2018  . Influenza Split 09/26/2015  . Influenza-Unspecified 10/20/2014    Past Medical History:  Diagnosis Date  . Anemia    as child in first grade  . Anxiety   . Arthritis   . Back pain   . Constipation   . CREST syndrome (HCC)   . Dyspnea   . Family history of adverse reaction to anesthesia    mom has had problems in past - N/V post op on one occasion; difficult time waking up on another occasion  . Gallbladder problem   . GERD (gastroesophageal reflux disease)   . Gestational diabetes mellitus in childbirth, diet controlled 1987  . IBS (irritable bowel syndrome)   . Joint pain   . Leg edema   . Menopause   . Osteoarthritis (arthritis due  to wear and tear of joints)   . PAH (pulmonary artery hypertension) (HCC)   . Pulmonary fibrosis (HCC)   . Raynaud disease    hands-toes  . Scleroderma (HCC)   . Shortness of breath dyspnea    pulmonary fibrosis-scleraderma  . Sleep apnea   . Swallowing difficulty   . Thyroid cyst   . Vitamin D deficiency   . Wears glasses     Tobacco History: Social History   Tobacco Use  Smoking Status Never Smoker  Smokeless Tobacco Never Used    Counseling given: Not Answered   Outpatient Medications Prior to Visit  Medication Sig Dispense Refill  . amLODipine (NORVASC) 5 MG tablet TAKE 1/2 TABLET BY MOUTH X2 WEEKS THEN INCREASE TO 1 TABLET ONCE A DAY    . baclofen (LIORESAL) 10 MG tablet TAKE 1 TABLET BY MOUTH 3 TIMES DAILY. (Patient taking differently: TAKE 1 TABLET BY MOUTH once DAILY at bedtime.) 90 tablet 0  . Biotin 5000 MCG CAPS Take 5,000 mcg by mouth daily.    [name]. buPROPion (WELLBUTRIN SR) 200 MG 12 hr tablet Take 1 tablet (200 mg total) by mouth daily. (Patient taking differently: Take 200 mg by mouth daily. Takes 150 mg) 90 tablet 0  . Cyanocobalamin (VITAMIN B-12) 5000 MCG SUBL Take 5,000 mcg by mouth daily.    [name]. desloratadine (CLARINEX) 5 MG tablet Take 5 mg by mouth at bedtime.   12  . diclofenac sodium (VOLTAREN) 1 % GEL Apply 2-4 g topically 4 (four) times daily as needed (for knee pain.).    [name]. fluticasone (FLONASE) 50 MCG/ACT nasal spray Place 1 spray into both nostrils as needed for allergies or rhinitis. Aller-Flo    . furosemide (LASIX) 20 MG tablet TAKE 1 TABLET (20 MG TOTAL) BY MOUTH DAILY. 90 tablet 3  . gabapentin (NEURONTIN) 100 MG capsule TAKE 1 CAPSULE BY MOUTH THREE TIMES A DAY (Patient taking differently: TAKE 1 CAPSULE once per day) 90 capsule 0  . levothyroxine (SYNTHROID) 50 MCG tablet Take 50 mcg by mouth daily before breakfast.    . Multiple Vitamin (MULTIVITAMIN WITH MINERALS) TABS tablet Take 1 tablet by mouth daily.    . OPSUMIT 10 MG tablet TAKE 1 TABLET BY MOUTH DAILY. DO NOT HANDLE IF PREGNANT. DO NOT SPLIT,CRUSH, OR CHEW. REVIEW MEDICATION GUIDE. CALL [phone] TO REFILL. 30 tablet 5  . Probiotic Product (PROBIOTIC PO) Take 1 capsule by mouth daily.    . Vitamin D, Ergocalciferol, (DRISDOL) 1.25 MG (50000 UT) CAPS capsule Take 1 capsule (50,000 Units total) by mouth every 7 (seven) days. 12 capsule 0   No facility-administered medications prior to visit.    Review of Systems  Review of  Systems  Constitutional: Negative.   HENT: Negative.   Respiratory: Negative for cough, shortness of breath and wheezing.        DOE  Cardiovascular: Negative.     Physical Exam  BP 108/78 (BP Location: Right Arm, Patient Position: Sitting, Cuff Size: Normal)   Pulse 70   Temp 97.8 F (36.6 C)   Ht 5\' 4"  (1.626 m)   Wt 212 lb 3.2 oz (96.3 kg)   SpO2 95%   BMI 36.42 kg/m  Physical Exam Constitutional:      Appearance: Normal appearance.  HENT:     Head: Normocephalic and atraumatic.     Mouth/Throat:     Mouth: Mucous membranes are moist.     Pharynx: Oropharynx is clear.  Cardiovascular:     Rate  and Rhythm: Normal rate and regular rhythm.  Pulmonary:     Effort: Pulmonary effort is normal.     Breath sounds: Normal breath sounds. No wheezing or rhonchi.  Musculoskeletal: Normal range of motion.  Skin:    General: Skin is warm and dry.  Neurological:     General: No focal deficit present.     Mental Status: She is alert and oriented to person, place, and time. Mental status is at baseline.  Psychiatric:        Mood and Affect: Mood normal.        Behavior: Behavior normal.        Thought Content: Thought content normal.        Judgment: Judgment normal.      Lab Results:  CBC    Component Value Date/Time   WBC 6.0 10/03/2018   WBC 6.2 01/13/2018 1000   RBC 4.43 01/13/2018 1000   HGB 13.8 10/03/2018   HGB 13.9 09/10/2017 1108   HCT 41 10/03/2018   HCT 41.6 09/10/2017 1108   PLT 216 10/03/2018   MCV 91.9 01/13/2018 1000   MCV 90 09/10/2017 1108   MCH 30.5 01/13/2018 1000   MCHC 33.2 01/13/2018 1000   RDW 13.4 01/13/2018 1000   RDW 14.1 09/10/2017 1108   LYMPHSABS 2.3 09/10/2017 1108   MONOABS 0.9 07/28/2015 0455   EOSABS 0.2 09/10/2017 1108   BASOSABS 0.0 09/10/2017 1108    BMET    Component Value Date/Time   NA 143 10/28/2018 0815   K 4.0 10/28/2018 0815   CL 105 10/28/2018 0815   CO2 24 10/28/2018 0815   GLUCOSE 81 10/28/2018 0815    GLUCOSE 86 01/13/2018 1000   BUN 13 10/28/2018 0815   CREATININE 0.88 10/28/2018 0815   CREATININE 0.75 03/08/2015 1501   CALCIUM 9.3 10/28/2018 0815   GFRNONAA 75 10/28/2018 0815   GFRNONAA >89 03/08/2015 1501   GFRAA 87 10/28/2018 0815   GFRAA >89 03/08/2015 1501    BNP    Component Value Date/Time   BNP 20.4 11/15/2014 1428    ProBNP No results found for: PROBNP  Imaging: No results found.   Assessment & Plan:   PAH (pulmonary artery hypertension) - Continue Lasix 20mg  daily and Opsumit (ERA) - Needs pulmonary function testing with DLCO in 6 months - Consider repeat HRCT at that visit if DLCO decreased  - She will consider referal to pulmonary rehab at next visit if needed - Encouraged patient to stay active - Continue follow with cardiology  - FU with Dr. Elsworth Soho in 6 months   OSA (obstructive sleep apnea) - Resolved with weight loss in 2016 - No new issues   Scleroderma ([place]) - Following with Rheumatology - Normal CXR and echocardiogram recently (recommend repeat echo in 5 years) - Started on Amlodipine 5 mg daily     Martyn Ehrich, NP 08/18/2019

## 2019-08-18 ENCOUNTER — Encounter: Payer: Self-pay | Admitting: Primary Care

## 2019-08-18 ENCOUNTER — Telehealth: Payer: Self-pay

## 2019-08-18 ENCOUNTER — Other Ambulatory Visit: Payer: Self-pay

## 2019-08-18 ENCOUNTER — Ambulatory Visit: Payer: BLUE CROSS/BLUE SHIELD | Admitting: Primary Care

## 2019-08-18 VITALS — BP 108/78 | HR 70 | Temp 97.8°F | Ht 64.0 in | Wt 212.2 lb

## 2019-08-18 DIAGNOSIS — I272 Pulmonary hypertension, unspecified: Secondary | ICD-10-CM

## 2019-08-18 DIAGNOSIS — I2721 Secondary pulmonary arterial hypertension: Secondary | ICD-10-CM

## 2019-08-18 DIAGNOSIS — M349 Systemic sclerosis, unspecified: Secondary | ICD-10-CM | POA: Insufficient documentation

## 2019-08-18 DIAGNOSIS — G4733 Obstructive sleep apnea (adult) (pediatric): Secondary | ICD-10-CM

## 2019-08-18 DIAGNOSIS — R0602 Shortness of breath: Secondary | ICD-10-CM

## 2019-08-18 NOTE — Telephone Encounter (Signed)
Please schedule PFT in 6 months and f/up with Elsworth Soho. Routed to [employer] for scheduling.  Recall for 6 months with [facility] placed.

## 2019-08-18 NOTE — Assessment & Plan Note (Addendum)
-   Following with Rheumatology - Normal CXR and echocardiogram recently (recommend repeat echo in 5 years) - Started on Amlodipine 5 mg daily

## 2019-08-18 NOTE — Patient Instructions (Addendum)
Needs pulmonary testing with DLCO in 6 months  Follow up in 6 months with Dr. Elsworth Soho  Consider repeat HRCT at that visit and referal to pulmonary rehab  Stay active, encourage daily walking for 10-20 mins  Continue follow with caridology and rhematology

## 2019-08-18 NOTE — Assessment & Plan Note (Signed)
-   Resolved with weight loss in 2016 - No new issues

## 2019-08-18 NOTE — Assessment & Plan Note (Addendum)
-   Continue Lasix 20mg  daily and Opsumit (ERA) - Needs pulmonary function testing with DLCO in 6 months - Consider repeat HRCT at that visit if DLCO decreased  - She will consider referal to pulmonary rehab at next visit if needed - Encouraged patient to stay active - Continue follow with cardiology  - FU with Dr. Elsworth Soho in 6 months

## 2019-08-20 NOTE — Telephone Encounter (Signed)
Recall placed includes pft - we will contact pt when time to schedule -pr

## 2019-08-23 ENCOUNTER — Other Ambulatory Visit (INDEPENDENT_AMBULATORY_CARE_PROVIDER_SITE_OTHER): Payer: Self-pay | Admitting: Family Medicine

## 2019-08-23 DIAGNOSIS — F3289 Other specified depressive episodes: Secondary | ICD-10-CM

## 2019-09-04 ENCOUNTER — Encounter (INDEPENDENT_AMBULATORY_CARE_PROVIDER_SITE_OTHER): Payer: Self-pay | Admitting: Family Medicine

## 2019-09-07 ENCOUNTER — Other Ambulatory Visit (INDEPENDENT_AMBULATORY_CARE_PROVIDER_SITE_OTHER): Payer: Self-pay

## 2019-09-07 DIAGNOSIS — F3289 Other specified depressive episodes: Secondary | ICD-10-CM

## 2019-09-07 MED ORDER — BUPROPION HCL ER (SR) 200 MG PO TB12: 200.0000 mg | ORAL_TABLET | Freq: Every day | ORAL | 0 refills | Status: DC

## 2019-09-14 ENCOUNTER — Encounter (INDEPENDENT_AMBULATORY_CARE_PROVIDER_SITE_OTHER): Payer: Self-pay | Admitting: Family Medicine

## 2019-09-14 ENCOUNTER — Ambulatory Visit (INDEPENDENT_AMBULATORY_CARE_PROVIDER_SITE_OTHER): Payer: BC Managed Care – PPO | Admitting: Family Medicine

## 2019-09-14 ENCOUNTER — Other Ambulatory Visit: Payer: Self-pay

## 2019-09-14 VITALS — BP 116/80 | HR 67 | Temp 97.6°F | Ht 64.0 in | Wt 210.0 lb

## 2019-09-14 DIAGNOSIS — F3289 Other specified depressive episodes: Secondary | ICD-10-CM

## 2019-09-14 DIAGNOSIS — Z9189 Other specified personal risk factors, not elsewhere classified: Secondary | ICD-10-CM

## 2019-09-14 DIAGNOSIS — E559 Vitamin D deficiency, unspecified: Secondary | ICD-10-CM | POA: Diagnosis not present

## 2019-09-14 DIAGNOSIS — Z6836 Body mass index (BMI) 36.0-36.9, adult: Secondary | ICD-10-CM

## 2019-09-15 LAB — VITAMIN D 25 HYDROXY (VIT D DEFICIENCY, FRACTURES): Vit D, 25-Hydroxy: 24.6 ng/mL — ABNORMAL LOW (ref 30.0–100.0)

## 2019-09-16 NOTE — Progress Notes (Signed)
Office: [phone]  /  Fax: [phone]   HPI:   Chief Complaint: OBESITY Rachael Camacho is here to discuss her progress with her obesity treatment plan. She is on the portion control better and make smarter food choices, such as increase vegetables and decrease simple carbohydrates and is following her eating plan approximately 0 % of the time. She states she is exercising 0 minutes 0 times per week. Rachael Camacho's last visit was approximately 5-6 months ago. She has done well maintaining her weight and ha s tried to portion control and make smart choices. She is happy with maintaining her weight and is not currently ready to back to a more structured diet plan.  Her weight is 210 lb (95.3 kg) today and has had a weight loss of 1 pound over a period of 5 months since her last visit. She has lost 0 lbs since starting treatment with [place].  Vitamin D Deficiency [name] has a diagnosis of vitamin D deficiency. She is stable on prescription Vit D and denies nausea, vomiting or muscle weakness. She is due for labs.  At risk for osteopenia and osteoporosis [name] is at higher risk of osteopenia and osteoporosis due to vitamin D deficiency.   Depression with Emotional Eating Behaviors Rachael Camacho has discontinued her Wellbutrin over 2 weeks on her own, and feels her mood is doing well. She still struggles with emotional eating and feels it hasn't worsened. She is using food for comfort to the extent that it is negatively impacting her health. She often snacks when she is not hungry. Rachael Camacho sometimes feels she is out of control and then feels guilty that she made poor food choices. She has been working on behavior modification techniques to help reduce her emotional eating and has been somewhat successful. She shows no sign of suicidal or homicidal ideations.  Depression screen [facility] 2/9 09/10/2017 10/18/2016 07/18/2016 05/02/2016 03/01/2016  Decreased Interest 2 0 0 0 0  Down, Depressed, Hopeless 2 0 0 0 0  PHQ - 2 Score 4  0 0 0 0  Altered sleeping 1 - - - -  Tired, decreased energy 3 - - - -  Change in appetite 1 - - - -  Feeling bad or failure about yourself  3 - - - -  Trouble concentrating 2 - - - -  Moving slowly or fidgety/restless 2 - - - -  Suicidal thoughts 0 - - - -  PHQ-9 Score 16 - - - -  Difficult doing work/chores Somewhat difficult - - - -    ASSESSMENT AND PLAN:  Vitamin D deficiency - Plan: VITAMIN D 25 Hydroxy (Vit-D Deficiency, Fractures)  Other depression  At risk for osteoporosis  Class 2 severe obesity with serious comorbidity and body mass index (BMI) of 36.0 to 36.9 in adult, unspecified obesity type (HCC)  PLAN:  Vitamin D Deficiency Rachael Camacho was informed that low vitamin D levels contributes to fatigue and are associated with obesity, breast, and colon cancer. Rachael Camacho agrees to continue taking prescription Vit D 50,000 IU every week #4 and we will refill for 1 month. She will follow up for routine testing of vitamin D, at least 2-3 times per year. She was informed of the risk of over-replacement of vitamin D and agrees to not increase her dose unless she discusses this with [place] first. We will check labs today. [name] agrees to follow up with our clinic in 3 weeks.  At risk for osteopenia and osteoporosis [name] was given extended (15 minutes)  osteoporosis prevention counseling today. Rachael Camacho is at risk for osteopenia and osteoporsis due to her vitamin D deficiency. She was encouraged to take her vitamin D and follow her higher calcium diet and increase strengthening exercise to help strengthen her bones and decrease her risk of osteopenia and osteoporosis.  Depression with Emotional Eating Behaviors We discussed behavior modification techniques today to help Rachael Camacho deal with her emotional eating and depression. Rachael Camacho agrees to discontinue Wellbutrin and she agrees to follow up with our clinic in 3 weeks.  Obesity Rachael Camacho is currently in the action stage of change. As such, her  goal is to continue with weight loss efforts She has agreed to portion control better and make smarter food choices, such as increase vegetables and decrease simple carbohydrates  Rachael Camacho has been instructed to work up to a goal of 150 minutes of combined cardio and strengthening exercise per week for weight loss and overall health benefits. We discussed the following Behavioral Modification Strategies today: work on meal planning and easy cooking plans, better snacking choices, and ways to avoid boredom eating   Rachael Camacho has agreed to follow up with our clinic in 3 weeks. She was informed of the importance of frequent follow up visits to maximize her success with intensive lifestyle modifications for her multiple health conditions.  ALLERGIES: Allergies  Allergen Reactions   Amoxicillin Other (See Comments)    Immediate yeast infection  Has patient had a PCN reaction causing immediate rash, facial/tongue/throat swelling, SOB or lightheadedness with hypotension: No Has patient had a PCN reaction causing severe rash involving mucus membranes or skin necrosis: No Has patient had a PCN reaction that required hospitalization: No Has patient had a PCN reaction occurring within the last 10 years: No If all of the above answers are "NO", then may proceed with Cephalosporin use.    Percocet [Oxycodone-Acetaminophen] Itching    Tolerates acetaminophen alone   [name] [Nitrofurantoin Monohyd Macro] Rash and Hives   [name] Hives and Rash    Reported chicken pox-like rash, not hives   Sulfa Antibiotics Rash and Hives    MEDICATIONS: Current Outpatient Medications on File Prior to Visit  Medication Sig Dispense Refill   amLODipine (NORVASC) 5 MG tablet TAKE 1/2 TABLET BY MOUTH X2 WEEKS THEN INCREASE TO 1 TABLET ONCE A DAY     baclofen (LIORESAL) 10 MG tablet TAKE 1 TABLET BY MOUTH 3 TIMES DAILY. (Patient taking differently: TAKE 1 TABLET BY MOUTH once DAILY at bedtime.) 90 tablet 0    Biotin 5000 MCG CAPS Take 5,000 mcg by mouth daily.     Cyanocobalamin (VITAMIN B-12) 5000 MCG SUBL Take 5,000 mcg by mouth daily.     desloratadine (CLARINEX) 5 MG tablet Take 5 mg by mouth at bedtime.   12   diclofenac sodium (VOLTAREN) 1 % GEL Apply 2-4 g topically 4 (four) times daily as needed (for knee pain.).     fluticasone (FLONASE) 50 MCG/ACT nasal spray Place 1 spray into both nostrils as needed for allergies or rhinitis. Aller-Flo     furosemide (LASIX) 20 MG tablet TAKE 1 TABLET (20 MG TOTAL) BY MOUTH DAILY. 90 tablet 3   gabapentin (NEURONTIN) 100 MG capsule TAKE 1 CAPSULE BY MOUTH THREE TIMES A DAY (Patient taking differently: TAKE 1 CAPSULE once per day) 90 capsule 0   levothyroxine (SYNTHROID) 50 MCG tablet Take 50 mcg by mouth daily before breakfast.     Multiple Vitamin (MULTIVITAMIN WITH MINERALS) TABS tablet Take 1 tablet by mouth daily.  OPSUMIT 10 MG tablet TAKE 1 TABLET BY MOUTH DAILY. DO NOT HANDLE IF PREGNANT. DO NOT SPLIT,CRUSH, OR CHEW. REVIEW MEDICATION GUIDE. CALL [phone] TO REFILL. 30 tablet 5   Probiotic Product (PROBIOTIC PO) Take 1 capsule by mouth daily.     Vitamin D, Ergocalciferol, (DRISDOL) 1.25 MG (50000 UT) CAPS capsule Take 1 capsule (50,000 Units total) by mouth every 7 (seven) days. 12 capsule 0   No current facility-administered medications on file prior to visit.     PAST MEDICAL HISTORY: Past Medical History:  Diagnosis Date   Anemia    as child in first grade   Anxiety    Arthritis    Back pain    Constipation    CREST syndrome (HCC)    Dyspnea    Family history of adverse reaction to anesthesia    mom has had problems in past - N/V post op on one occasion; difficult time waking up on another occasion   Gallbladder problem    GERD (gastroesophageal reflux disease)    Gestational diabetes mellitus in childbirth, diet controlled 1987   IBS (irritable bowel syndrome)    Joint pain    Leg edema     Menopause    Osteoarthritis (arthritis due to wear and tear of joints)    PAH (pulmonary artery hypertension) (HCC)    Pulmonary fibrosis (HCC)    Raynaud disease    hands-toes   Scleroderma (HCC)    Shortness of breath dyspnea    pulmonary fibrosis-scleraderma   Sleep apnea    Swallowing difficulty    Thyroid cyst    Vitamin D deficiency    Wears glasses     PAST SURGICAL HISTORY: Past Surgical History:  Procedure Laterality Date   CARDIAC CATHETERIZATION N/A 11/25/2015   Procedure: Right Heart Cath;  Surgeon: Dolores Patty, MD;  [facility] INVASIVE CV LAB;  Service: Cardiovascular;  Laterality: N/A;   CESAREAN SECTION  87,95   CHOLECYSTECTOMY  1996   lap choli   COLONOSCOPY     ESOPHAGOGASTRODUODENOSCOPY (EGD) WITH PROPOFOL N/A 02/12/2017   Procedure: ESOPHAGOGASTRODUODENOSCOPY (EGD) WITH PROPOFOL;  Surgeon: Vida Rigger, MD;  Location: [facility] ENDOSCOPY;  Service: Endoscopy;  Laterality: N/A;   HIATAL HERNIA REPAIR  07/26/2015   Procedure: LAPAROSCOPIC REPAIR OF HIATAL HERNIA;  Surgeon: Luretha Murphy, MD;  Location: [facility] ORS;  Service: General;;   LAPAROSCOPIC GASTRIC SLEEVE RESECTION N/A 07/26/2015   Procedure: LAPAROSCOPIC GASTRIC SLEEVE RESECTION;  Surgeon: Luretha Murphy, MD;  Location: [facility] ORS;  Service: General;  Laterality: N/A;   LEFT AND RIGHT HEART CATHETERIZATION WITH CORONARY ANGIOGRAM N/A 03/14/2015   Procedure: LEFT AND RIGHT HEART CATHETERIZATION WITH CORONARY ANGIOGRAM;  Surgeon: Peter M Swaziland, MD;  Location: [facility] CATH LAB;  Service: Cardiovascular;  Laterality: N/A;   POSTERIOR CERVICAL FUSION/FORAMINOTOMY N/A 01/16/2018   Procedure: Cervical one-two Posterior instrumentation and fusion;  Surgeon: Tressie Stalker, MD;  Location: [facility] OR;  Service: Neurosurgery;  Laterality: N/A;   RADIAL HEAD ARTHROPLASTY Right 07/28/2014   Procedure: RIGHT RADIAL HEAD REPLACEMENT;  Surgeon: Dairl Ponder, MD;  Location: [facility];  Service:  Orthopedics;  Laterality: Right;   RIGHT HEART CATH N/A 03/18/2017   Procedure: Right Heart Cath;  Surgeon: Dolores Patty, MD;  Location: [facility] INVASIVE CV LAB;  Service: Cardiovascular;  Laterality: N/A;   SAVORY DILATION N/A 02/12/2017   Procedure: SAVORY DILATION;  Surgeon: Vida Rigger, MD;  Location: [facility];  Service: Endoscopy;  Laterality: N/A;   TUBAL LIGATION  UPPER GI ENDOSCOPY  07/26/2015   Procedure: UPPER GI ENDOSCOPY;  Surgeon: Johnathan Hausen, MD;  Location: [facility] ORS;  Service: General;;   VAGINAL HYSTERECTOMY  2010   [facility]    SOCIAL HISTORY: Social History   Tobacco Use   Smoking status: Never Smoker   Smokeless tobacco: Never Used  Substance Use Topics   Alcohol use: Yes    Alcohol/week: 0.0 standard drinks    Comment: seldom   Drug use: No    FAMILY HISTORY: Family History  Problem Relation Age of Onset   Heart failure Mother        Stent put in 2014 for blockage   COPD Mother    Hypertension Mother    Hyperlipidemia Mother    Heart disease Mother    Thyroid disease Mother    Cancer Mother    Depression Mother    Anxiety disorder Mother    Heart disease Father    Heart disease Son        Repair ASD   Neuropathy Neg Hx     ROS: Review of Systems  Constitutional: Positive for weight loss.  Gastrointestinal: Negative for nausea and vomiting.  Musculoskeletal:       Negative muscle weakness  Psychiatric/Behavioral: Positive for depression. Negative for suicidal ideas.    PHYSICAL EXAM: Blood pressure 116/80, pulse 67, temperature 97.6 F (36.4 C), temperature source Oral, height 5\' 4"  (1.626 m), weight 210 lb (95.3 kg), SpO2 95 %. Body mass index is 36.05 kg/m. Physical Exam Vitals signs reviewed.  Constitutional:      Appearance: Normal appearance. She is obese.  Cardiovascular:     Rate and Rhythm: Normal rate.     Pulses: Normal pulses.  Pulmonary:     Effort: Pulmonary effort is normal.     Breath sounds:  Normal breath sounds.  Musculoskeletal: Normal range of motion.  Skin:    General: Skin is warm and dry.  Neurological:     Mental Status: She is alert and oriented to person, place, and time.  Psychiatric:        Mood and Affect: Mood normal.        Behavior: Behavior normal.     RECENT LABS AND TESTS: BMET    Component Value Date/Time   NA 143 10/28/2018 0815   K 4.0 10/28/2018 0815   CL 105 10/28/2018 0815   CO2 24 10/28/2018 0815   GLUCOSE 81 10/28/2018 0815   GLUCOSE 86 01/13/2018 1000   BUN 13 10/28/2018 0815   CREATININE 0.88 10/28/2018 0815   CREATININE 0.75 03/08/2015 1501   CALCIUM 9.3 10/28/2018 0815   GFRNONAA 75 10/28/2018 0815   GFRNONAA >89 03/08/2015 1501   GFRAA 87 10/28/2018 0815   GFRAA >89 03/08/2015 1501   Lab Results  Component Value Date   HGBA1C 4.9 06/25/2018   HGBA1C 4.9 12/10/2017   HGBA1C 5.0 09/10/2017   Lab Results  Component Value Date   INSULIN 8.2 06/25/2018   INSULIN 4.4 12/10/2017   INSULIN 6.1 09/10/2017   CBC    Component Value Date/Time   WBC 6.0 10/03/2018   WBC 6.2 01/13/2018 1000   RBC 4.43 01/13/2018 1000   HGB 13.8 10/03/2018   HGB 13.9 09/10/2017 1108   HCT 41 10/03/2018   HCT 41.6 09/10/2017 1108   PLT 216 10/03/2018   MCV 91.9 01/13/2018 1000   MCV 90 09/10/2017 1108   MCH 30.5 01/13/2018 1000   MCHC 33.2 01/13/2018 1000   RDW 13.4  01/13/2018 1000   RDW 14.1 09/10/2017 1108   LYMPHSABS 2.3 09/10/2017 1108   MONOABS 0.9 07/28/2015 0455   EOSABS 0.2 09/10/2017 1108   BASOSABS 0.0 09/10/2017 1108   Iron/TIBC/Ferritin/ %Sat No results found for: IRON, TIBC, FERRITIN, IRONPCTSAT Lipid Panel     Component Value Date/Time   CHOL 166 10/03/2018   CHOL 159 06/25/2018 1039   TRIG 100 06/25/2018 1039   HDL 68 10/03/2018   HDL 73 06/25/2018 1039   LDLCALC 74 10/03/2018   LDLCALC 66 06/25/2018 1039   Hepatic Function Panel     Component Value Date/Time   PROT 6.4 10/28/2018 0815   ALBUMIN 4.1 10/28/2018  0815   AST 17 10/28/2018 0815   ALT 13 10/28/2018 0815   ALKPHOS 87 10/28/2018 0815   BILITOT 0.4 10/28/2018 0815      Component Value Date/Time   TSH 2.00 10/03/2018   TSH 2.690 09/10/2017 1108      OBESITY BEHAVIORAL INTERVENTION VISIT  Today's visit was # 20   Starting weight: 207 lbs Starting date: 09/10/17 Today's weight : 210 lbs Today's date: 09/14/2019 Total lbs lost to date: 0    ASK: We discussed the diagnosis of obesity with Burley Saver today and Rodnesha agreed to give [place] permission to discuss obesity behavioral modification therapy today.  ASSESS: Berdine has the diagnosis of obesity and her BMI today is 36.03 Taraji is in the action stage of change   ADVISE: Edee was educated on the multiple health risks of obesity as well as the benefit of weight loss to improve her health. She was advised of the need for long term treatment and the importance of lifestyle modifications to improve her current health and to decrease her risk of future health problems.  AGREE: Multiple dietary modification options and treatment options were discussed and  Ingri agreed to follow the recommendations documented in the above note.  ARRANGE: Lennie was educated on the importance of frequent visits to treat obesity as outlined per CMS and USPSTF guidelines and agreed to schedule her next follow up appointment today.  I, Burt Knack, am acting as transcriptionist for Quillian Quince, MD  I have reviewed the above documentation for accuracy and completeness, and I agree with the above. -Quillian Quince, MD

## 2019-09-17 MED ORDER — VITAMIN D (ERGOCALCIFEROL) 1.25 MG (50000 UNIT) PO CAPS: 50000.0000 [IU] | ORAL_CAPSULE | ORAL | 0 refills | Status: DC

## 2019-10-05 ENCOUNTER — Ambulatory Visit (INDEPENDENT_AMBULATORY_CARE_PROVIDER_SITE_OTHER): Payer: BC Managed Care – PPO | Admitting: Family Medicine

## 2019-10-08 ENCOUNTER — Other Ambulatory Visit: Payer: Self-pay

## 2019-10-08 ENCOUNTER — Ambulatory Visit (INDEPENDENT_AMBULATORY_CARE_PROVIDER_SITE_OTHER): Payer: BC Managed Care – PPO | Admitting: Family Medicine

## 2019-10-08 ENCOUNTER — Encounter (INDEPENDENT_AMBULATORY_CARE_PROVIDER_SITE_OTHER): Payer: Self-pay | Admitting: Family Medicine

## 2019-10-08 VITALS — BP 111/76 | HR 72 | Temp 97.9°F | Ht 64.0 in | Wt 211.0 lb

## 2019-10-08 DIAGNOSIS — Z6836 Body mass index (BMI) 36.0-36.9, adult: Secondary | ICD-10-CM

## 2019-10-08 DIAGNOSIS — K5909 Other constipation: Secondary | ICD-10-CM

## 2019-10-08 DIAGNOSIS — Z9189 Other specified personal risk factors, not elsewhere classified: Secondary | ICD-10-CM | POA: Diagnosis not present

## 2019-10-08 DIAGNOSIS — E559 Vitamin D deficiency, unspecified: Secondary | ICD-10-CM

## 2019-10-08 MED ORDER — VITAMIN D (ERGOCALCIFEROL) 1.25 MG (50000 UNIT) PO CAPS: 50000.0000 [IU] | ORAL_CAPSULE | ORAL | 0 refills | Status: DC

## 2019-10-11 ENCOUNTER — Other Ambulatory Visit (INDEPENDENT_AMBULATORY_CARE_PROVIDER_SITE_OTHER): Payer: Self-pay | Admitting: Family Medicine

## 2019-10-11 DIAGNOSIS — F3289 Other specified depressive episodes: Secondary | ICD-10-CM

## 2019-10-14 NOTE — Progress Notes (Signed)
Office: [phone]  /  Fax: [phone]   HPI:   Chief Complaint: OBESITY Rachael Camacho is here to discuss her progress with her obesity treatment plan. She is on the portion control better and make smarter food choices, such as increase vegetables and decrease simple carbohydrates and is following her eating plan approximately 0 % of the time. She states she is exercising 0 minutes 0 times per week. Rachael Camacho has struggled to stay on track with eating and is frustrated with weight gain. She states she is ready to get back on track.  Her weight is 211 lb (95.7 kg) today and has gained 1 lb since her last visit. She has lost 0 lbs since starting treatment with us.  Vitamin D Deficiency Rachael Camacho has a diagnosis of vitamin D deficiency. She is stable on prescription Vit D and denies nausea, vomiting or muscle weakness.  At risk for osteopenia and osteoporosis Rachael Camacho is at higher risk of osteopenia and osteoporosis due to vitamin D deficiency.   Constipation Rachael Camacho notes increased constipation, abdominal pain, and bloating. She is on a probiotic which she takes at night, but she wonders if taking it in the morning might help. She denies hematochezia or melena. She denies drinking less H20 recently.  ASSESSMENT AND PLAN:  Vitamin D deficiency - Plan: Vitamin D, Ergocalciferol, (DRISDOL) 1.25 MG (50000 UT) CAPS capsule  Other constipation  At risk for osteoporosis  Class 2 severe obesity with serious comorbidity and body mass index (BMI) of 36.0 to 36.9 in adult, unspecified obesity type (HCC)  PLAN:  Vitamin D Deficiency Rachael Camacho was informed that low vitamin D levels contributes to fatigue and are associated with obesity, breast, and colon cancer. Rachael Camacho agrees to continue taking prescription Vit D 50,000 IU every week #4 and will follow up for routine testing of vitamin D, at least 2-3 times per year. She was informed of the risk of over-replacement of vitamin D and agrees to not increase her  dose unless she discusses this with us first. Rachael Camacho agrees to follow up with our clinic 3 weeks.  At risk for osteopenia and osteoporosis Rachael Camacho was given extended (15 minutes) osteoporosis prevention counseling today. Rachael Camacho is at risk for osteopenia and osteoporsis due to her vitamin D deficiency. She was encouraged to take her vitamin D and follow her higher calcium diet and increase strengthening exercise to help strengthen her bones and decrease her risk of osteopenia and osteoporosis.  Constipation Rachael Camacho was informed decrease bowel movement frequency is normal while losing weight, but stools should not be hard or painful. She was advised to increase her H20 intake and work on increasing her fiber intake. High fiber foods were discussed today. Rachael Camacho is to continue OTC miralax as needed, and it is ok for her to change her probiotic to morning. Rachael Camacho agrees to follow up with our clinic in 3 weeks.  Obesity Rachael Camacho is currently in the action stage of change. As such, her goal is to continue with weight loss efforts She has agreed to keep a food journal with 1200 calories and 75+ grams of protein daily Shredded chicken recipes were given.  Rachael Camacho has been instructed to work up to a goal of 150 minutes of combined cardio and strengthening exercise per week for weight loss and overall health benefits. We discussed the following Behavioral Modification Strategies today: increasing lean protein intake, decreasing simple carbohydrates, decreasing sodium intake, increase H20 intake, and emotional eating strategies   Rachael Camacho has agreed to follow up with our  clinic in 3 weeks. She was informed of the importance of frequent follow up visits to maximize her success with intensive lifestyle modifications for her multiple health conditions.  ALLERGIES: Allergies  Allergen Reactions  . Amoxicillin Other (See Comments)    Immediate yeast infection  Has patient had a PCN reaction causing immediate rash,  facial/tongue/throat swelling, SOB or lightheadedness with hypotension: No Has patient had a PCN reaction causing severe rash involving mucus membranes or skin necrosis: No Has patient had a PCN reaction that required hospitalization: No Has patient had a PCN reaction occurring within the last 10 years: No If all of the above answers are "NO", then may proceed with Cephalosporin use.   [name] Percocet [Oxycodone-Acetaminophen] Itching    Tolerates acetaminophen alone  . [name] [Nitrofurantoin Monohyd Macro] Rash and Hives  . [name] Hives and Rash    Reported chicken pox-like rash, not hives  . Sulfa Antibiotics Rash and Hives    MEDICATIONS: Current Outpatient Medications on File Prior to Visit  Medication Sig Dispense Refill  . amLODipine (NORVASC) 5 MG tablet TAKE 1/2 TABLET BY MOUTH X2 WEEKS THEN INCREASE TO 1 TABLET ONCE A DAY    . baclofen (LIORESAL) 10 MG tablet TAKE 1 TABLET BY MOUTH 3 TIMES DAILY. (Patient taking differently: TAKE 1 TABLET BY MOUTH once DAILY at bedtime.) 90 tablet 0  . Biotin 5000 MCG CAPS Take 5,000 mcg by mouth daily.    . Cyanocobalamin (VITAMIN B-12) 5000 MCG SUBL Take 5,000 mcg by mouth daily.    [name] desloratadine (CLARINEX) 5 MG tablet Take 5 mg by mouth at bedtime.   12  . diclofenac sodium (VOLTAREN) 1 % GEL Apply 2-4 g topically 4 (four) times daily as needed (for knee pain.).    [name] fluticasone (FLONASE) 50 MCG/ACT nasal spray Place 1 spray into both nostrils as needed for allergies or rhinitis. Aller-Flo    . furosemide (LASIX) 20 MG tablet TAKE 1 TABLET (20 MG TOTAL) BY MOUTH DAILY. 90 tablet 3  . gabapentin (NEURONTIN) 100 MG capsule TAKE 1 CAPSULE BY MOUTH THREE TIMES A DAY (Patient taking differently: TAKE 1 CAPSULE once per day) 90 capsule 0  . levothyroxine (SYNTHROID) 50 MCG tablet Take 50 mcg by mouth daily before breakfast.    . Multiple Vitamin (MULTIVITAMIN WITH MINERALS) TABS tablet Take 1 tablet by mouth daily.    . OPSUMIT 10 MG tablet TAKE  1 TABLET BY MOUTH DAILY. DO NOT HANDLE IF PREGNANT. DO NOT SPLIT,CRUSH, OR CHEW. REVIEW MEDICATION GUIDE. CALL [phone] TO REFILL. 30 tablet 5  . Probiotic Product (PROBIOTIC PO) Take 1 capsule by mouth daily.     No current facility-administered medications on file prior to visit.     PAST MEDICAL HISTORY: Past Medical History:  Diagnosis Date  . Anemia    as child in first grade  . Anxiety   . Arthritis   . Back pain   . Constipation   . CREST syndrome (HCC)   . Dyspnea   . Family history of adverse reaction to anesthesia    mom has had problems in past - N/V post op on one occasion; difficult time waking up on another occasion  . Gallbladder problem   . GERD (gastroesophageal reflux disease)   . Gestational diabetes mellitus in childbirth, diet controlled 1987  . IBS (irritable bowel syndrome)   . Joint pain   . Leg edema   . Menopause   . Osteoarthritis (arthritis due to wear and tear of  joints)   . PAH (pulmonary artery hypertension) ([place])   . Pulmonary fibrosis ([place])   . Raynaud disease    hands-toes  . Scleroderma ([place])   . Shortness of breath dyspnea    pulmonary fibrosis-scleraderma  . Sleep apnea   . Swallowing difficulty   . Thyroid cyst   . Vitamin D deficiency   . Wears glasses     PAST SURGICAL HISTORY: Past Surgical History:  Procedure Laterality Date  . CARDIAC CATHETERIZATION N/A 11/25/2015   Procedure: Right Heart Cath;  Surgeon: Jolaine Artist, MD;  Location: [facility];  Service: Cardiovascular;  Laterality: N/A;  . CESAREAN SECTION  87,95  . CHOLECYSTECTOMY  1996   lap choli  . COLONOSCOPY    . ESOPHAGOGASTRODUODENOSCOPY (EGD) WITH PROPOFOL N/A 02/12/2017   Procedure: ESOPHAGOGASTRODUODENOSCOPY (EGD) WITH PROPOFOL;  Surgeon: Clarene Essex, MD;  Location: [facility];  Service: Endoscopy;  Laterality: N/A;  . HIATAL HERNIA REPAIR  07/26/2015   Procedure: LAPAROSCOPIC REPAIR OF HIATAL HERNIA;  Surgeon: Johnathan Hausen, MD;  Location: [facility];  Service: General;;  . LAPAROSCOPIC GASTRIC SLEEVE RESECTION N/A 07/26/2015   Procedure: LAPAROSCOPIC GASTRIC SLEEVE RESECTION;  Surgeon: Johnathan Hausen, MD;  Location: [facility];  Service: General;  Laterality: N/A;  . LEFT AND RIGHT HEART CATHETERIZATION WITH CORONARY ANGIOGRAM N/A 03/14/2015   Procedure: LEFT AND RIGHT HEART CATHETERIZATION WITH CORONARY ANGIOGRAM;  Surgeon: Peter M Martinique, MD;  Location: [facility];  Service: Cardiovascular;  Laterality: N/A;  . POSTERIOR CERVICAL FUSION/FORAMINOTOMY N/A 01/16/2018   Procedure: Cervical one-two Posterior instrumentation and fusion;  Surgeon: Newman Pies, MD;  Location: [facility];  Service: Neurosurgery;  Laterality: N/A;  . RADIAL HEAD ARTHROPLASTY Right 07/28/2014   Procedure: RIGHT RADIAL HEAD REPLACEMENT;  Surgeon: Charlotte Crumb, MD;  Location: [facility];  Service: Orthopedics;  Laterality: Right;  . RIGHT HEART CATH N/A 03/18/2017   Procedure: Right Heart Cath;  Surgeon: Jolaine Artist, MD;  Location: [facility];  Service: Cardiovascular;  Laterality: N/A;  . SAVORY DILATION N/A 02/12/2017   Procedure: SAVORY DILATION;  Surgeon: Clarene Essex, MD;  Location: [facility];  Service: Endoscopy;  Laterality: N/A;  . TUBAL LIGATION    . UPPER GI ENDOSCOPY  07/26/2015   Procedure: UPPER GI ENDOSCOPY;  Surgeon: Johnathan Hausen, MD;  Location: [facility];  Service: General;;  . VAGINAL HYSTERECTOMY  2010   [facility]    SOCIAL HISTORY: Social History   Tobacco Use  . Smoking status: Never Smoker  . Smokeless tobacco: Never Used  Substance Use Topics  . Alcohol use: Yes    Alcohol/week: 0.0 standard drinks    Comment: seldom  . Drug use: No    FAMILY HISTORY: Family History  Problem Relation Age of Onset  . Heart failure Mother        Stent put in 2014 for blockage  . COPD Mother   . Hypertension Mother   . Hyperlipidemia Mother   . Heart disease Mother   . Thyroid disease Mother   . Cancer Mother   . Depression  Mother   . Anxiety disorder Mother   . Heart disease Father   . Heart disease Son        Repair ASD  . Neuropathy Neg Hx     ROS: Review of Systems  Constitutional: Negative for weight loss.  Gastrointestinal: Positive for abdominal pain and constipation. Negative for melena, nausea and vomiting.       +  Bloating Negative hematochezia  Musculoskeletal:       Negative muscle weakness    PHYSICAL EXAM: Blood pressure 111/76, pulse 72, temperature 97.9 F (36.6 C), temperature source Oral, height  (1.626 m), weight 211 lb (95.7 kg), SpO2 93 %. Body mass index is 36.22 kg/m. Physical Exam Vitals signs reviewed.  Constitutional:      Appearance: Normal appearance. She is obese.  Cardiovascular:     Rate and Rhythm: Normal rate.     Pulses: Normal pulses.  Pulmonary:     Effort: Pulmonary effort is normal.     Breath sounds: Normal breath sounds.  Musculoskeletal: Normal range of motion.  Skin:    General: Skin is warm and dry.  Neurological:     Mental Status: She is alert and oriented to person, place, and time.  Psychiatric:        Mood and Affect: Mood normal.        Behavior: Behavior normal.     RECENT LABS AND TESTS: BMET    Component Value Date/Time   NA 143 10/28/2018 0815   K 4.0 10/28/2018 0815   CL 105 10/28/2018 0815   CO2 24 10/28/2018 0815   GLUCOSE 81 10/28/2018 0815   GLUCOSE 86 01/13/2018 1000   BUN 13 10/28/2018 0815   CREATININE 0.88 10/28/2018 0815   CREATININE 0.75 03/08/2015 1501   CALCIUM 9.3 10/28/2018 0815   GFRNONAA 75 10/28/2018 0815   GFRNONAA >89 03/08/2015 1501   GFRAA 87 10/28/2018 0815   GFRAA >89 03/08/2015 1501   Lab Results  Component Value Date   HGBA1C 4.9 06/25/2018   HGBA1C 4.9 12/10/2017   HGBA1C 5.0 09/10/2017   Lab Results  Component Value Date   INSULIN 8.2 06/25/2018   INSULIN 4.4 12/10/2017   INSULIN 6.1 09/10/2017   CBC    Component Value Date/Time   WBC 6.0 10/03/2018   WBC 6.2 01/13/2018  1000   RBC 4.43 01/13/2018 1000   HGB 13.8 10/03/2018   HGB 13.9 09/10/2017 1108   HCT 41 10/03/2018   HCT 41.6 09/10/2017 1108   PLT 216 10/03/2018   MCV 91.9 01/13/2018 1000   MCV 90 09/10/2017 1108   MCH 30.5 01/13/2018 1000   MCHC 33.2 01/13/2018 1000   RDW 13.4 01/13/2018 1000   RDW 14.1 09/10/2017 1108   LYMPHSABS 2.3 09/10/2017 1108   MONOABS 0.9 07/28/2015 0455   EOSABS 0.2 09/10/2017 1108   BASOSABS 0.0 09/10/2017 1108   Iron/TIBC/Ferritin/ %Sat No results found for: IRON, TIBC, FERRITIN, IRONPCTSAT Lipid Panel     Component Value Date/Time   CHOL 166 10/03/2018   CHOL 159 06/25/2018 1039   TRIG 100 06/25/2018 1039   HDL 68 10/03/2018   HDL 73 06/25/2018 1039   LDLCALC 74 10/03/2018   LDLCALC 66 06/25/2018 1039   Hepatic Function Panel     Component Value Date/Time   PROT 6.4 10/28/2018 0815   ALBUMIN 4.1 10/28/2018 0815   AST 17 10/28/2018 0815   ALT 13 10/28/2018 0815   ALKPHOS 87 10/28/2018 0815   BILITOT 0.4 10/28/2018 0815      Component Value Date/Time   TSH 2.00 10/03/2018   TSH 2.690 09/10/2017 1108      OBESITY BEHAVIORAL INTERVENTION VISIT  Today's visit was # 21   Starting weight: 207 lbs Starting date: 09/10/17 Today's weight : 211 lbs  Today's date: 10/08/2019 Total lbs lost to date: 0    ASK: We discussed the diagnosis of  obesity with Burley Saver today and Ahonesty agreed to give [place] permission to discuss obesity behavioral modification therapy today.  ASSESS: Renna has the diagnosis of obesity and her BMI today is 36.2 [place] is in the action stage of change   ADVISE: Joslynne was educated on the multiple health risks of obesity as well as the benefit of weight loss to improve her health. She was advised of the need for long term treatment and the importance of lifestyle modifications to improve her current health and to decrease her risk of future health problems.  AGREE: Multiple dietary modification options and  treatment options were discussed and  Akeiba agreed to follow the recommendations documented in the above note.  ARRANGE: Aliyana was educated on the importance of frequent visits to treat obesity as outlined per CMS and USPSTF guidelines and agreed to schedule her next follow up appointment today.  I, Burt Knack, am acting as transcriptionist for Quillian Quince, MD  I have reviewed the above documentation for accuracy and completeness, and I agree with the above. -Quillian Quince, MD

## 2019-10-28 ENCOUNTER — Other Ambulatory Visit: Payer: Self-pay | Admitting: *Deleted

## 2019-10-28 MED ORDER — GABAPENTIN 100 MG PO CAPS: ORAL_CAPSULE | ORAL | 1 refills | Status: DC

## 2019-10-29 ENCOUNTER — Other Ambulatory Visit: Payer: Self-pay

## 2019-10-29 ENCOUNTER — Encounter (INDEPENDENT_AMBULATORY_CARE_PROVIDER_SITE_OTHER): Payer: Self-pay | Admitting: Family Medicine

## 2019-10-29 ENCOUNTER — Ambulatory Visit (INDEPENDENT_AMBULATORY_CARE_PROVIDER_SITE_OTHER): Payer: BC Managed Care – PPO | Admitting: Family Medicine

## 2019-10-29 VITALS — BP 106/72 | HR 77 | Temp 98.0°F | Ht 64.0 in | Wt 211.0 lb

## 2019-10-29 DIAGNOSIS — Z9189 Other specified personal risk factors, not elsewhere classified: Secondary | ICD-10-CM | POA: Diagnosis not present

## 2019-10-29 DIAGNOSIS — R42 Dizziness and giddiness: Secondary | ICD-10-CM

## 2019-10-29 DIAGNOSIS — Z6836 Body mass index (BMI) 36.0-36.9, adult: Secondary | ICD-10-CM

## 2019-10-29 DIAGNOSIS — R5383 Other fatigue: Secondary | ICD-10-CM | POA: Diagnosis not present

## 2019-11-02 NOTE — Progress Notes (Signed)
Office: [phone]  /  Fax: [phone]   HPI:   Chief Complaint: OBESITY Rachael Camacho is here to discuss her progress with her obesity treatment plan. She is on the keep a food journal with 1200 calories and 75+ grams of protein daily and is following her eating plan approximately 5 % of the time. She states she is more mindful of her steps. Rachael Camacho continues to do well maintaining her weight. She is mostly trying to portion control and is no journaling so much. she states she has been very tired and not meal planning as well.  Her weight is 211 lb (95.7 kg) today and has not lost weight since her last visit. She has lost 0 lbs since starting treatment with [place].  Fatigue Rachael Camacho notes increased fatigue in the last 2-3 weeks. She denies recent illness. She is falling asleep easily and is still sleeping 7-8 hours per night. Rachael Camacho denies daytime somnolence  Vertigo Rachael Camacho notes feeling a little dizzy and unsteady on her feet 2-3 times in the last week. Her allergies have worsened and she is taking Clarinex.  At risk for cardiovascular disease Rachael Camacho is at a higher than average risk for cardiovascular disease due to obesity. She currently denies any chest pain.  ASSESSMENT AND PLAN:  Other fatigue - Plan: Comprehensive Metabolic Panel (CMET), CBC w/Diff/Platelet, HgB A1c, Insulin, random, Lipid Panel With LDL/HDL Ratio, Vitamin D (25 hydroxy), B12, Folate, T3, T4, free, TSH  Vertigo  At risk for heart disease  Class 2 severe obesity with serious comorbidity and body mass index (BMI) of 36.0 to 36.9 in adult, unspecified obesity type (HCC)  PLAN:  Fatigue Rachael Camacho was informed that her fatigue may be related to obesity, depression or many other causes. We will recheck labs, and in the meanwhile [name] has agreed to work on diet, exercise and weight loss to help with fatigue. Proper sleep hygiene was discussed including the need for 7-8 hours of quality sleep each night. [name] agrees to  follow up with our clinic in 2 to 3 weeks.  Vertigo Rachael Camacho agrees to start Flonase and continue Clarinex. We will recheck labs and [name] agrees to follow up with our clinic in 2 to 3 weeks.  Cardiovascular risk counseling Rachael Camacho was given extended (15 minutes) coronary artery disease prevention counseling today. She is 53 y.o. female and has risk factors for heart disease including obesity. We discussed intensive lifestyle modifications today with an emphasis on specific weight loss instructions and strategies. Pt was also informed of the importance of increasing exercise and decreasing saturated fats to help prevent heart disease.  Obesity Rachael Camacho is currently in the action stage of change. As such, her goal is to maintain weight for now She has agreed to portion control better and make smarter food choices, such as increase vegetables and decrease simple carbohydrates  Rachael Camacho has been instructed to work up to a goal of 150 minutes of combined cardio and strengthening exercise per week for weight loss and overall health benefits. We discussed the following Behavioral Modification Strategies today: increasing lean protein intake and no skipping meals   Rachael Camacho has agreed to follow up with our clinic in 2 to 3 weeks. She was informed of the importance of frequent follow up visits to maximize her success with intensive lifestyle modifications for her multiple health conditions.  ALLERGIES: Allergies  Allergen Reactions  . Amoxicillin Other (See Comments)    Immediate yeast infection  Has patient had a PCN reaction causing immediate rash, facial/tongue/throat  swelling, SOB or lightheadedness with hypotension: No Has patient had a PCN reaction causing severe rash involving mucus membranes or skin necrosis: No Has patient had a PCN reaction that required hospitalization: No Has patient had a PCN reaction occurring within the last 10 years: No If all of the above answers are "NO", then may  proceed with Cephalosporin use.   Rachael Camacho Kitchen Percocet [Oxycodone-Acetaminophen] Itching    Tolerates acetaminophen alone  . [name] [Nitrofurantoin Monohyd Macro] Rash and Hives  . [name] Hives and Rash    Reported chicken pox-like rash, not hives  . Sulfa Antibiotics Rash and Hives    MEDICATIONS: Current Outpatient Medications on File Prior to Visit  Medication Sig Dispense Refill  . amLODipine (NORVASC) 5 MG tablet TAKE 1/2 TABLET BY MOUTH X2 WEEKS THEN INCREASE TO 1 TABLET ONCE A DAY    . baclofen (LIORESAL) 10 MG tablet TAKE 1 TABLET BY MOUTH 3 TIMES DAILY. (Patient taking differently: TAKE 1 TABLET BY MOUTH once DAILY at bedtime.) 90 tablet 0  . Biotin 5000 MCG CAPS Take 5,000 mcg by mouth daily.    . Cyanocobalamin (VITAMIN B-12) 5000 MCG SUBL Take 5,000 mcg by mouth daily.    Rachael Camacho Kitchen desloratadine (CLARINEX) 5 MG tablet Take 5 mg by mouth at bedtime.   12  . diclofenac sodium (VOLTAREN) 1 % GEL Apply 2-4 g topically 4 (four) times daily as needed (for knee pain.).    Rachael Camacho Kitchen fluticasone (FLONASE) 50 MCG/ACT nasal spray Place 1 spray into both nostrils as needed for allergies or rhinitis. Aller-Flo    . furosemide (LASIX) 20 MG tablet TAKE 1 TABLET (20 MG TOTAL) BY MOUTH DAILY. 90 tablet 3  . gabapentin (NEURONTIN) 100 MG capsule TAKE 1 CAPSULE BY MOUTH THREE TIMES A DAY 270 capsule 1  . levothyroxine (SYNTHROID) 50 MCG tablet Take 50 mcg by mouth daily before breakfast.    . Multiple Vitamin (MULTIVITAMIN WITH MINERALS) TABS tablet Take 1 tablet by mouth daily.    . OPSUMIT 10 MG tablet TAKE 1 TABLET BY MOUTH DAILY. DO NOT HANDLE IF PREGNANT. DO NOT SPLIT,CRUSH, OR CHEW. REVIEW MEDICATION GUIDE. CALL [phone] TO REFILL. 30 tablet 5  . Probiotic Product (PROBIOTIC PO) Take 1 capsule by mouth daily.    . Vitamin D, Ergocalciferol, (DRISDOL) 1.25 MG (50000 UT) CAPS capsule Take 1 capsule (50,000 Units total) by mouth every 7 (seven) days. 4 capsule 0   No current facility-administered  medications on file prior to visit.     PAST MEDICAL HISTORY: Past Medical History:  Diagnosis Date  . Anemia    as child in first grade  . Anxiety   . Arthritis   . Back pain   . Constipation   . CREST syndrome ([place])   . Dyspnea   . Family history of adverse reaction to anesthesia    mom has had problems in past - N/V post op on one occasion; difficult time waking up on another occasion  . Gallbladder problem   . GERD (gastroesophageal reflux disease)   . Gestational diabetes mellitus in childbirth, diet controlled 1987  . IBS (irritable bowel syndrome)   . Joint pain   . Leg edema   . Menopause   . Osteoarthritis (arthritis due to wear and tear of joints)   . PAH (pulmonary artery hypertension) ([place])   . Pulmonary fibrosis ([place])   . Raynaud disease    hands-toes  . Scleroderma ([place])   . Shortness of breath dyspnea    pulmonary  fibrosis-scleraderma  . Sleep apnea   . Swallowing difficulty   . Thyroid cyst   . Vitamin D deficiency   . Wears glasses     PAST SURGICAL HISTORY: Past Surgical History:  Procedure Laterality Date  . CARDIAC CATHETERIZATION N/A 11/25/2015   Procedure: Right Heart Cath;  Surgeon: Dolores Patty, MD;  Location: [facility] INVASIVE CV LAB;  Service: Cardiovascular;  Laterality: N/A;  . CESAREAN SECTION  87,95  . CHOLECYSTECTOMY  1996   lap choli  . COLONOSCOPY    . ESOPHAGOGASTRODUODENOSCOPY (EGD) WITH PROPOFOL N/A 02/12/2017   Procedure: ESOPHAGOGASTRODUODENOSCOPY (EGD) WITH PROPOFOL;  Surgeon: Vida Rigger, MD;  Location: [facility];  Service: Endoscopy;  Laterality: N/A;  . HIATAL HERNIA REPAIR  07/26/2015   Procedure: LAPAROSCOPIC REPAIR OF HIATAL HERNIA;  Surgeon: Luretha Murphy, MD;  Location: [facility] ORS;  Service: General;;  . LAPAROSCOPIC GASTRIC SLEEVE RESECTION N/A 07/26/2015   Procedure: LAPAROSCOPIC GASTRIC SLEEVE RESECTION;  Surgeon: Luretha Murphy, MD;  Location: [facility];  Service: General;  Laterality: N/A;  . LEFT AND RIGHT HEART  CATHETERIZATION WITH CORONARY ANGIOGRAM N/A 03/14/2015   Procedure: LEFT AND RIGHT HEART CATHETERIZATION WITH CORONARY ANGIOGRAM;  Surgeon: Peter M [place]and, MD;  Location: [facility] CATH LAB;  Service: Cardiovascular;  Laterality: N/A;  . POSTERIOR CERVICAL FUSION/FORAMINOTOMY N/A 01/16/2018   Procedure: Cervical one-two Posterior instrumentation and fusion;  Surgeon: Tressie Stalker, MD;  Location: [facility] OR;  Service: Neurosurgery;  Laterality: N/A;  . RADIAL HEAD ARTHROPLASTY Right 07/28/2014   Procedure: RIGHT RADIAL HEAD REPLACEMENT;  Surgeon: Dairl Ponder, MD;  Location: [facility];  Service: Orthopedics;  Laterality: Right;  . RIGHT HEART CATH N/A 03/18/2017   Procedure: Right Heart Cath;  Surgeon: Dolores Patty, MD;  Location: [facility] INVASIVE CV LAB;  Service: Cardiovascular;  Laterality: N/A;  . SAVORY DILATION N/A 02/12/2017   Procedure: SAVORY DILATION;  Surgeon: Vida Rigger, MD;  Location: [facility];  Service: Endoscopy;  Laterality: N/A;  . TUBAL LIGATION    . UPPER GI ENDOSCOPY  07/26/2015   Procedure: UPPER GI ENDOSCOPY;  Surgeon: Luretha Murphy, MD;  Location: [facility];  Service: General;;  . VAGINAL HYSTERECTOMY  2010   LAVH    SOCIAL HISTORY: Social History   Tobacco Use  . Smoking status: Never Smoker  . Smokeless tobacco: Never Used  Substance Use Topics  . Alcohol use: Yes    Alcohol/week: 0.0 standard drinks    Comment: seldom  . Drug use: No    FAMILY HISTORY: Family History  Problem Relation Age of Onset  . Heart failure Mother        Stent put in 2014 for blockage  . COPD Mother   . Hypertension Mother   . Hyperlipidemia Mother   . Heart disease Mother   . Thyroid disease Mother   . Cancer Mother   . Depression Mother   . Anxiety disorder Mother   . Heart disease Father   . Heart disease Son        Repair ASD  . Neuropathy Neg Hx     ROS: Review of Systems  Constitutional: Positive for malaise/fatigue. Negative for weight loss.   Cardiovascular: Negative for chest pain.  Neurological: Positive for dizziness.    PHYSICAL EXAM: Blood pressure 106/72, pulse 77, temperature 98 F (36.7 C), temperature source Oral, height  (1.626 m), weight 211 lb (95.7 kg), SpO2 96 %. Body mass index is 36.22 kg/m. Physical Exam Vitals signs reviewed.  Constitutional:  Appearance: Normal appearance. She is obese.  Cardiovascular:     Rate and Rhythm: Normal rate.     Pulses: Normal pulses.  Pulmonary:     Effort: Pulmonary effort is normal.     Breath sounds: Normal breath sounds.  Musculoskeletal: Normal range of motion.  Skin:    General: Skin is warm and dry.  Neurological:     Mental Status: She is alert and oriented to person, place, and time.  Psychiatric:        Mood and Affect: Mood normal.        Behavior: Behavior normal.     RECENT LABS AND TESTS: BMET    Component Value Date/Time   NA 143 10/28/2018 0815   K 4.0 10/28/2018 0815   CL 105 10/28/2018 0815   CO2 24 10/28/2018 0815   GLUCOSE 81 10/28/2018 0815   GLUCOSE 86 01/13/2018 1000   BUN 13 10/28/2018 0815   CREATININE 0.88 10/28/2018 0815   CREATININE 0.75 03/08/2015 1501   CALCIUM 9.3 10/28/2018 0815   GFRNONAA 75 10/28/2018 0815   GFRNONAA >89 03/08/2015 1501   GFRAA 87 10/28/2018 0815   GFRAA >89 03/08/2015 1501   Lab Results  Component Value Date   HGBA1C 4.9 06/25/2018   HGBA1C 4.9 12/10/2017   HGBA1C 5.0 09/10/2017   Lab Results  Component Value Date   INSULIN 8.2 06/25/2018   INSULIN 4.4 12/10/2017   INSULIN 6.1 09/10/2017   CBC    Component Value Date/Time   WBC 6.0 10/03/2018   WBC 6.2 01/13/2018 1000   RBC 4.43 01/13/2018 1000   HGB 13.8 10/03/2018   HGB 13.9 09/10/2017 1108   HCT 41 10/03/2018   HCT 41.6 09/10/2017 1108   PLT 216 10/03/2018   MCV 91.9 01/13/2018 1000   MCV 90 09/10/2017 1108   MCH 30.5 01/13/2018 1000   MCHC 33.2 01/13/2018 1000   RDW 13.4 01/13/2018 1000   RDW 14.1 09/10/2017 1108    LYMPHSABS 2.3 09/10/2017 1108   MONOABS 0.9 07/28/2015 0455   EOSABS 0.2 09/10/2017 1108   BASOSABS 0.0 09/10/2017 1108   Iron/TIBC/Ferritin/ %Sat No results found for: IRON, TIBC, FERRITIN, IRONPCTSAT Lipid Panel     Component Value Date/Time   CHOL 166 10/03/2018   CHOL 159 06/25/2018 1039   TRIG 100 06/25/2018 1039   HDL 68 10/03/2018   HDL 73 06/25/2018 1039   LDLCALC 74 10/03/2018   LDLCALC 66 06/25/2018 1039   Hepatic Function Panel     Component Value Date/Time   PROT 6.4 10/28/2018 0815   ALBUMIN 4.1 10/28/2018 0815   AST 17 10/28/2018 0815   ALT 13 10/28/2018 0815   ALKPHOS 87 10/28/2018 0815   BILITOT 0.4 10/28/2018 0815      Component Value Date/Time   TSH 2.00 10/03/2018   TSH 2.690 09/10/2017 1108      OBESITY BEHAVIORAL INTERVENTION VISIT  Today's visit was # 22   Starting weight: 207 lbs Starting date: 09/10/17 Today's weight : 211 lbs Today's date: 10/29/2019 Total lbs lost to date: 0    ASK: We discussed the diagnosis of obesity with Burley Saver today and Rachael Land agreed to give [place] permission to discuss obesity behavioral modification therapy today.  ASSESS: Melyna has the diagnosis of obesity and her BMI today is 36.2 Wendi is in the action stage of change   ADVISE: Telicia was educated on the multiple health risks of obesity as well as the benefit of weight loss to  improve her health. She was advised of the need for long term treatment and the importance of lifestyle modifications to improve her current health and to decrease her risk of future health problems.  AGREE: Multiple dietary modification options and treatment options were discussed and  Rachael Camacho agreed to follow the recommendations documented in the above note.  ARRANGE: Rachael Camacho was educated on the importance of frequent visits to treat obesity as outlined per CMS and USPSTF guidelines and agreed to schedule her next follow up appointment today.  I, Burt KnackSharon Lohr, am acting as  transcriptionist for Quillian Quincearen , MD  I have reviewed the above documentation for accuracy and completeness, and I agree with the above. -Quillian Quincearen , MD

## 2019-11-11 LAB — COMPREHENSIVE METABOLIC PANEL
ALT: 12 IU/L (ref 0–32)
AST: 18 IU/L (ref 0–40)
Albumin/Globulin Ratio: 1.7 (ref 1.2–2.2)
Albumin: 3.7 g/dL — ABNORMAL LOW (ref 3.8–4.9)
Alkaline Phosphatase: 92 IU/L (ref 39–117)
BUN/Creatinine Ratio: 16 (ref 9–23)
BUN: 14 mg/dL (ref 6–24)
Bilirubin Total: 0.5 mg/dL (ref 0.0–1.2)
CO2: 25 mmol/L (ref 20–29)
Calcium: 9.3 mg/dL (ref 8.7–10.2)
Chloride: 108 mmol/L — ABNORMAL HIGH (ref 96–106)
Creatinine, Ser: 0.85 mg/dL (ref 0.57–1.00)
GFR calc Af Amer: 90 mL/min/{1.73_m2} (ref >59)
GFR calc non Af Amer: 78 mL/min/{1.73_m2} (ref >59)
Globulin, Total: 2.2 g/dL (ref 1.5–4.5)
Glucose: 87 mg/dL (ref 65–99)
Potassium: 4.3 mmol/L (ref 3.5–5.2)
Sodium: 145 mmol/L — ABNORMAL HIGH (ref 134–144)
Total Protein: 5.9 g/dL — ABNORMAL LOW (ref 6.0–8.5)

## 2019-11-11 LAB — CBC WITH DIFFERENTIAL/PLATELET
Basophils Absolute: 0.1 10*3/uL (ref 0.0–0.2)
Basos: 1 %
EOS (ABSOLUTE): 0.1 10*3/uL (ref 0.0–0.4)
Eos: 3 %
Hematocrit: 39 % (ref 34.0–46.6)
Hemoglobin: 13 g/dL (ref 11.1–15.9)
Immature Grans (Abs): 0 10*3/uL (ref 0.0–0.1)
Immature Granulocytes: 0 %
Lymphocytes Absolute: 1.8 10*3/uL (ref 0.7–3.1)
Lymphs: 38 %
MCH: 30.4 pg (ref 26.6–33.0)
MCHC: 33.3 g/dL (ref 31.5–35.7)
MCV: 91 fL (ref 79–97)
Monocytes Absolute: 0.3 10*3/uL (ref 0.1–0.9)
Monocytes: 7 %
Neutrophils Absolute: 2.4 10*3/uL (ref 1.4–7.0)
Neutrophils: 51 %
Platelets: 227 10*3/uL (ref 150–450)
RBC: 4.28 x10E6/uL (ref 3.77–5.28)
RDW: 12.3 % (ref 11.7–15.4)
WBC: 4.8 10*3/uL (ref 3.4–10.8)

## 2019-11-11 LAB — LIPID PANEL WITH LDL/HDL RATIO
Cholesterol, Total: 159 mg/dL (ref 100–199)
HDL: 71 mg/dL (ref >39)
LDL Chol Calc (NIH): 71 mg/dL (ref 0–99)
LDL/HDL Ratio: 1 ratio (ref 0.0–3.2)
Triglycerides: 96 mg/dL (ref 0–149)
VLDL Cholesterol Cal: 17 mg/dL (ref 5–40)

## 2019-11-11 LAB — HEMOGLOBIN A1C
Est. average glucose Bld gHb Est-mCnc: 97 mg/dL
Hgb A1c MFr Bld: 5 % (ref 4.8–5.6)

## 2019-11-11 LAB — T4, FREE: Free T4: 1.48 ng/dL (ref 0.82–1.77)

## 2019-11-11 LAB — T3: T3, Total: 106 ng/dL (ref 71–180)

## 2019-11-11 LAB — VITAMIN D 25 HYDROXY (VIT D DEFICIENCY, FRACTURES): Vit D, 25-Hydroxy: 33.9 ng/mL (ref 30.0–100.0)

## 2019-11-11 LAB — INSULIN, RANDOM: INSULIN: 9.9 u[IU]/mL (ref 2.6–24.9)

## 2019-11-11 LAB — TSH: TSH: 2.86 u[IU]/mL (ref 0.450–4.500)

## 2019-11-11 LAB — VITAMIN B12: Vitamin B-12: 433 pg/mL (ref 232–1245)

## 2019-11-11 LAB — FOLATE: Folate: 4 ng/mL (ref >3.0)

## 2019-11-13 IMAGING — US US THYROID
1 series · 14 of 25 positions shown · non-contrast
Comparison: 11/26/2017

CLINICAL DATA: Goiter.  Multinodular goiter.  Previous biopsy.

EXAM:
THYROID ULTRASOUND
TECHNIQUE: Ultrasound examination of the thyroid gland and adjacent soft
tissues was performed.

[Series 1: us thyroid · 0.05mm/px · 14 of 64 slices shown]
[im 1/64]
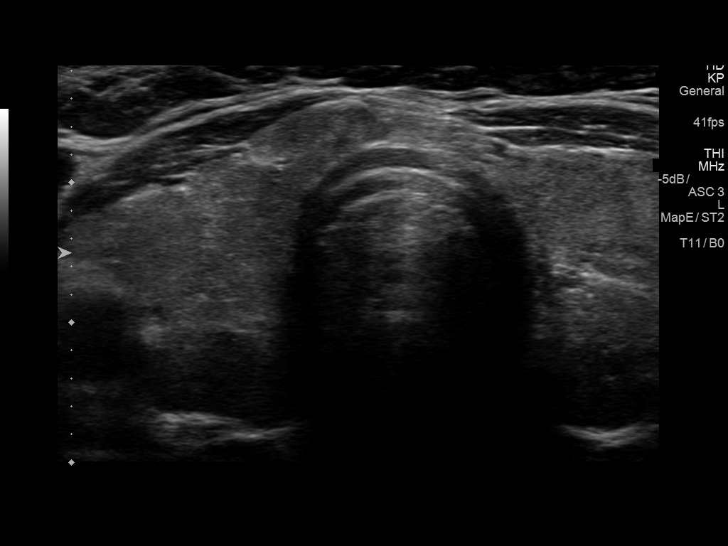
[im 6/64]
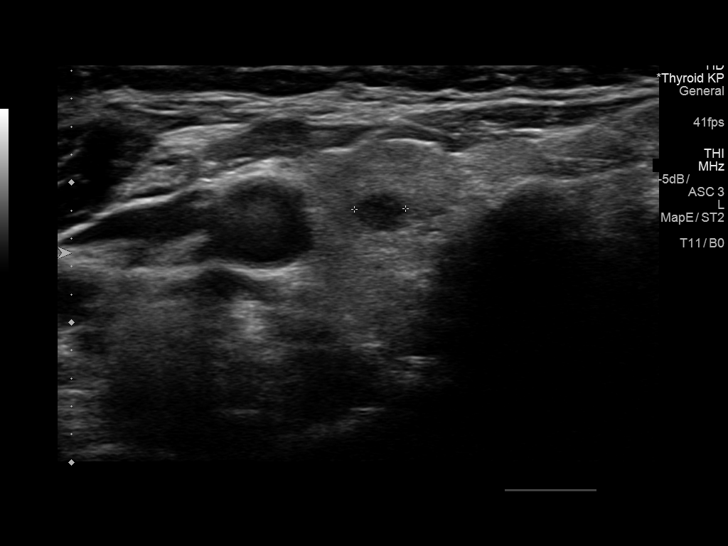
[im 11/64]
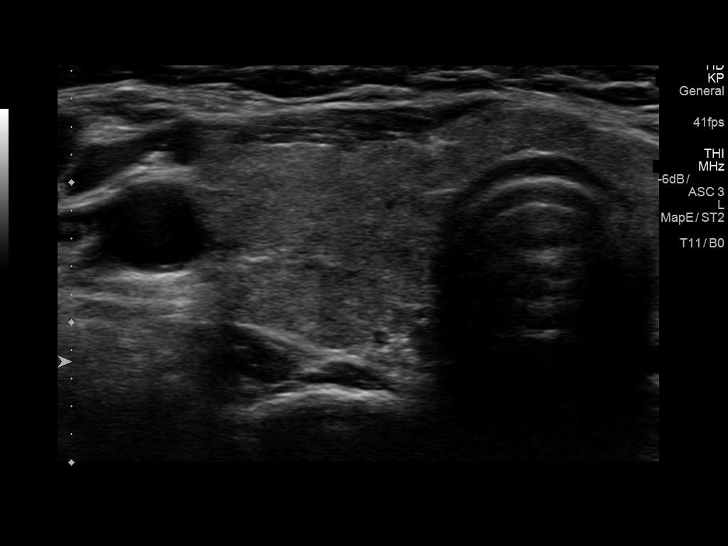
[im 16/64]
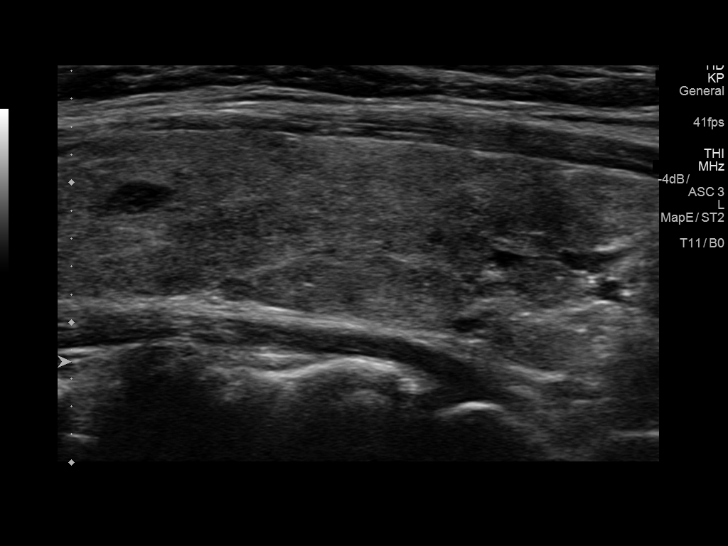
[im 22/64]
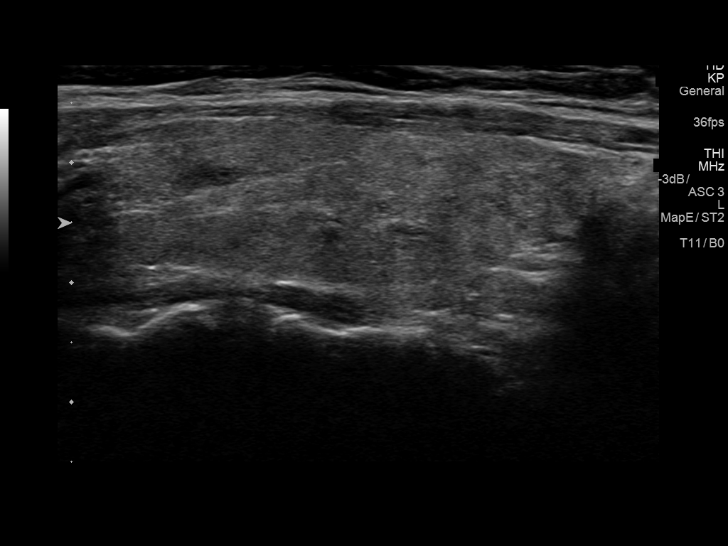
[im 24/64]
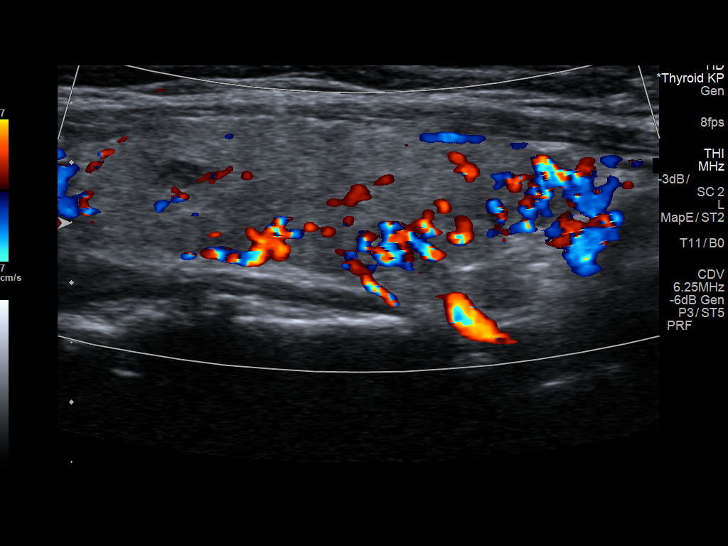
[im 29/64]
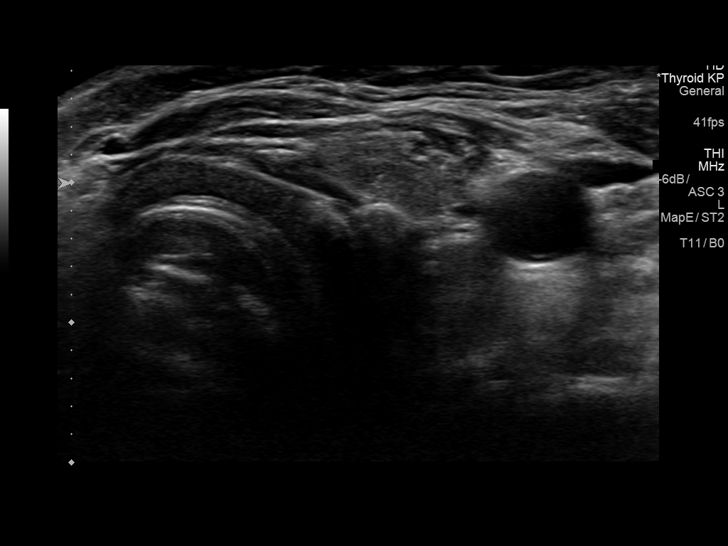
[im 35/64]
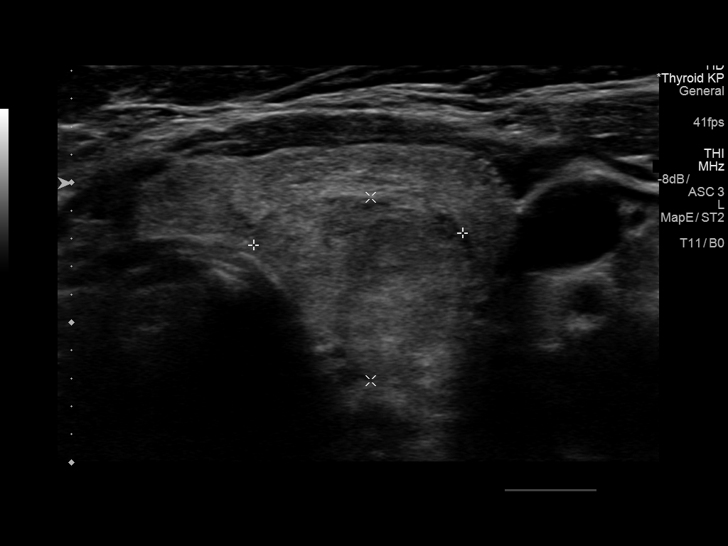
[im 40/64]
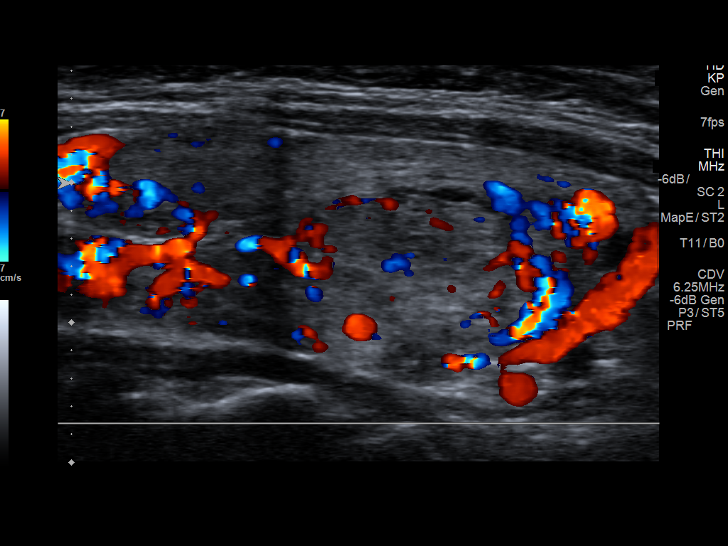
[im 43/64]
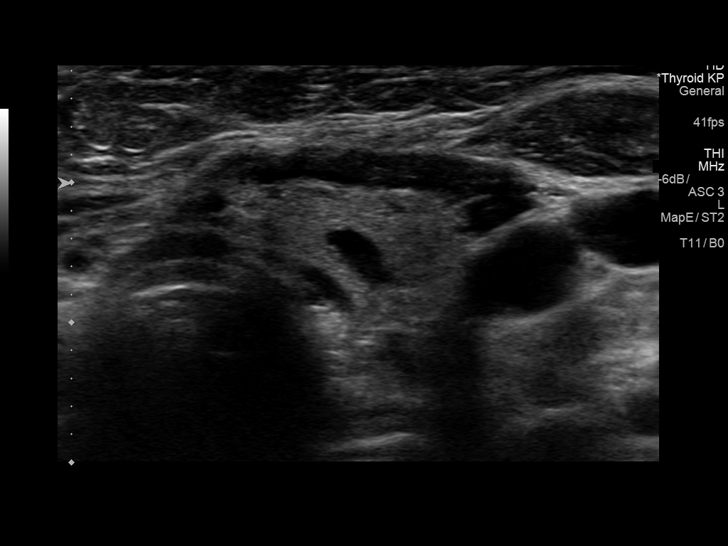
[im 48/64]
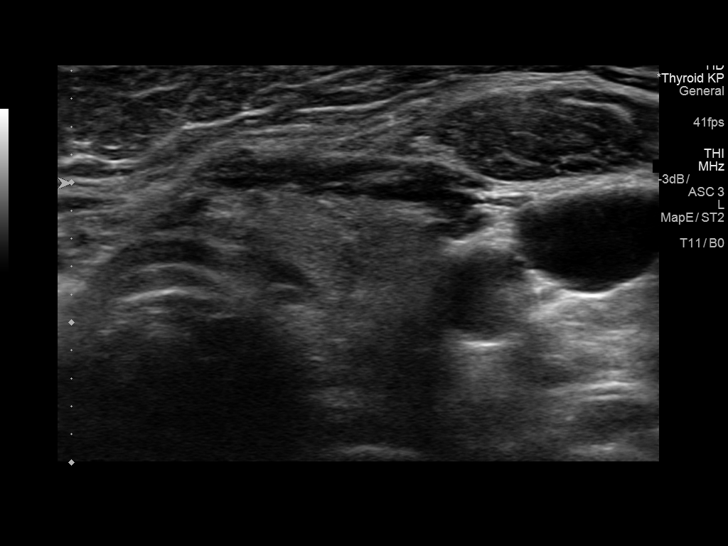
[im 53/64]
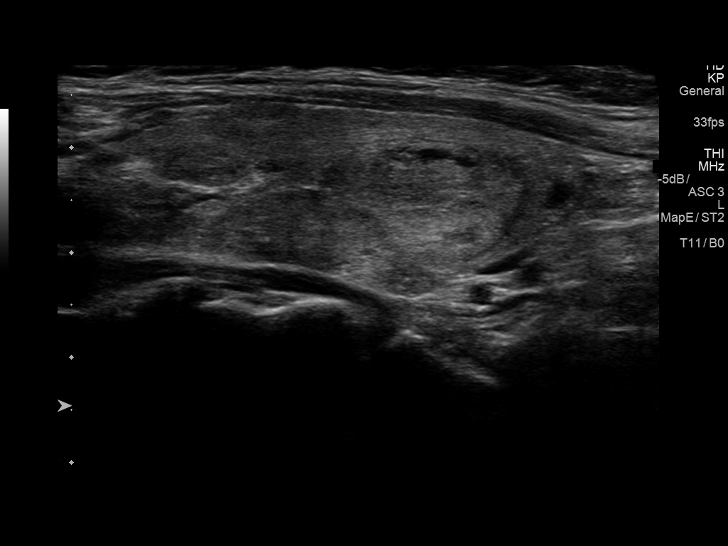
[im 58/64]
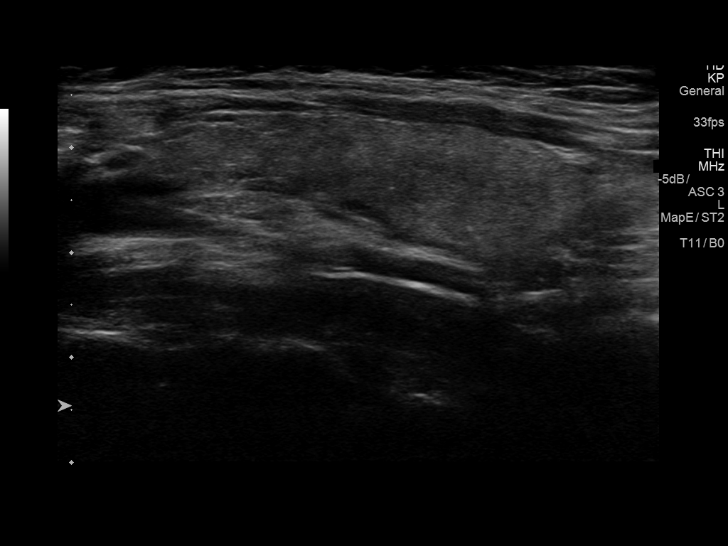
[im 64/64]
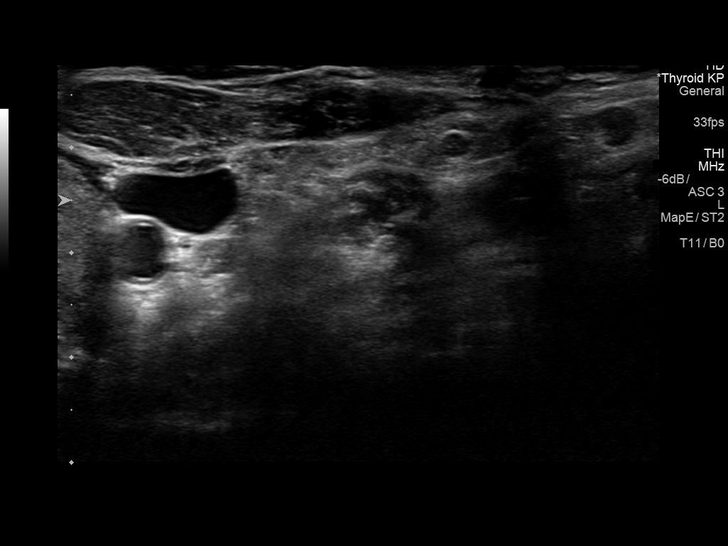

[14 of 25 positions shown; findings below may reference images not displayed]

FINDINGS: Parenchymal Echotexture: Moderately heterogenous

Isthmus: 0.4 cm, previously 0.4 cm

Right lobe: 4.6 x 1.5 x 1.6 cm, previously 4.9 x 1.5 x 2.0 cm

Left lobe: 4.9 x 1.8 x 1.9 cm, previously 5.1 x 2.0 x 1.8 cm

_________________________________________________________

Estimated total number of nodules >/= 1 cm: 1

Number of spongiform nodules >/=  2 cm not described below (TR1): 0

Number of mixed cystic and solid nodules >/= 1.5 cm not described
below (TR2): 0

_________________________________________________________

Left lower pole nodule 1 measures 2.0 x 1.3 x 1.5 cm and previously
measured 2.2 x 1.2 x 1.5 cm. Biopsy was performed previously on
07/02/2017.

0.6 cm simple cyst in the lower pole is stable.
IMPRESSION: Stable left lower pole nodule 1 which previously underwent biopsy
07/02/2017.

The above is in keeping with the ACR TI-RADS recommendations - [HOSPITAL] 6867;[DATE].

## 2019-11-16 ENCOUNTER — Encounter (INDEPENDENT_AMBULATORY_CARE_PROVIDER_SITE_OTHER): Payer: Self-pay

## 2019-11-16 ENCOUNTER — Other Ambulatory Visit: Payer: Self-pay

## 2019-11-16 ENCOUNTER — Encounter (INDEPENDENT_AMBULATORY_CARE_PROVIDER_SITE_OTHER): Payer: Self-pay | Admitting: Family Medicine

## 2019-11-16 ENCOUNTER — Ambulatory Visit (INDEPENDENT_AMBULATORY_CARE_PROVIDER_SITE_OTHER): Payer: BC Managed Care – PPO | Admitting: Family Medicine

## 2019-11-16 VITALS — BP 103/71 | HR 58 | Temp 97.9°F | Ht 64.0 in | Wt 214.0 lb

## 2019-11-16 DIAGNOSIS — Z9189 Other specified personal risk factors, not elsewhere classified: Secondary | ICD-10-CM

## 2019-11-16 DIAGNOSIS — E559 Vitamin D deficiency, unspecified: Secondary | ICD-10-CM | POA: Diagnosis not present

## 2019-11-16 DIAGNOSIS — F3289 Other specified depressive episodes: Secondary | ICD-10-CM

## 2019-11-16 DIAGNOSIS — Z6836 Body mass index (BMI) 36.0-36.9, adult: Secondary | ICD-10-CM

## 2019-11-16 MED ORDER — TOPIRAMATE 50 MG PO TABS: 50.0000 mg | ORAL_TABLET | Freq: Every day | ORAL | 0 refills | Status: DC

## 2019-11-17 NOTE — Progress Notes (Signed)
Office: [phone]  /  Fax: [phone]   HPI:   Chief Complaint: OBESITY Rachael Camacho is here to discuss her progress with her obesity treatment plan. She is keeping a food journal and is following her eating plan approximately 0 % of the time. Rachael Camacho was advised to portion control and make smarter choices. She states she is exercising 0 minutes 0 times per week. Rachael Camacho continues to gain weight and states that she is not following a structured plan. She has not been in the active stage of change and recognizes that she needs to get back on track.  Her weight is 214 lb (97.1 kg) today and has had a weight gain of 3 pounds over a period of 3 weeks since her last visit. She has lost 0 lbs since starting treatment with us.  Vitamin D Deficiency Rachael Camacho has a diagnosis of vitamin D deficiency. She is currently on vit D. [place]'s vitamin D level had worsened, but her labs discussed today, are starting to improve again.  At risk for osteopenia and osteoporosis Rachael Camacho is at higher risk of osteopenia and osteoporosis due to vitamin D deficiency.   Depression with emotional eating behaviors Rachael Camacho had been on Wellbutrin in the past, but is not any longer. She is still struggling with emotional eating and using food for comfort to the extent that it is negatively impacting her health. She often snacks when she is not hungry. Rachael Camacho sometimes feels she is out of control and then feels guilty that she made poor food choices. She has been working on behavior modification techniques to help reduce her emotional eating and has been somewhat successful.   ASSESSMENT AND PLAN:  Other depression, with emotional eating  - Plan: topiramate (TOPAMAX) 50 MG tablet  Vitamin D deficiency  At risk for osteoporosis  Class 2 severe obesity with serious comorbidity and body mass index (BMI) of 36.0 to 36.9 in adult, unspecified obesity type (HCC)  PLAN:  Vitamin D Deficiency Rachael Camacho was informed that low vitamin  D levels contribute to fatigue and are associated with obesity, breast, and colon cancer. Rachael Camacho agrees to continue to take prescription Vit D @50 ,000 IU every week and will follow up for routine testing of vitamin D, at least 2-3 times per year. She was informed of the risk of over-replacement of vitamin D and agrees to not increase her dose unless she discusses this with us first. We will recheck labs again in 3 months. Rachael Camacho agrees to follow up in 2 to 3 weeks as directed.  At risk for osteopenia and osteoporosis Rachael Camacho was given extended (15 minutes) osteoporosis prevention counseling today. Rachael Camacho is at risk for osteopenia and osteoporosis due to her vitamin D deficiency. She was encouraged to take her vitamin D and follow her higher calcium diet and increase strengthening exercise to help strengthen her bones and decrease her risk of osteopenia and osteoporosis.  Depression with Emotional Eating Behaviors We discussed behavior modification techniques today to help Rachael Camacho deal with her emotional eating and depression. She has agreed to start Topamax 50 mg qhs #30 with no refills and agreed to follow up as directed.  Obesity Rachael Camacho is not currently in the action stage of change. As such, her goal is to maintain weight for now over Thanksgiving and then start back to the Category 2 plan. She has agreed to change to follow the Category 2 plan. Rachael Camacho has been instructed to work up to a goal of 150 minutes of combined cardio and strengthening  exercise per week for weight loss and overall health benefits. We discussed the following Behavioral Modification Strategies today: holiday eating strategies .  Marisue has agreed to follow up with our clinic in 2 to 3 weeks. She was informed of the importance of frequent follow up visits to maximize her success with intensive lifestyle modifications for her multiple health conditions.  ALLERGIES: Allergies  Allergen Reactions  . Amoxicillin Other (See  Comments)    Immediate yeast infection  Has patient had a PCN reaction causing immediate rash, facial/tongue/throat swelling, SOB or lightheadedness with hypotension: No Has patient had a PCN reaction causing severe rash involving mucus membranes or skin necrosis: No Has patient had a PCN reaction that required hospitalization: No Has patient had a PCN reaction occurring within the last 10 years: No If all of the above answers are "NO", then may proceed with Cephalosporin use.   [name] Percocet [Oxycodone-Acetaminophen] Itching    Tolerates acetaminophen alone  . [name] [Nitrofurantoin Monohyd Macro] Rash and Hives  . [name] Hives and Rash    Reported chicken pox-like rash, not hives  . Sulfa Antibiotics Rash and Hives    MEDICATIONS: Current Outpatient Medications on File Prior to Visit  Medication Sig Dispense Refill  . amLODipine (NORVASC) 5 MG tablet TAKE 1/2 TABLET BY MOUTH X2 WEEKS THEN INCREASE TO 1 TABLET ONCE A DAY    . baclofen (LIORESAL) 10 MG tablet TAKE 1 TABLET BY MOUTH 3 TIMES DAILY. (Patient taking differently: TAKE 1 TABLET BY MOUTH once DAILY at bedtime.) 90 tablet 0  . Biotin 5000 MCG CAPS Take 5,000 mcg by mouth daily.    . Cyanocobalamin (VITAMIN B-12) 5000 MCG SUBL Take 5,000 mcg by mouth daily.    [name] desloratadine (CLARINEX) 5 MG tablet Take 5 mg by mouth at bedtime.   12  . diclofenac sodium (VOLTAREN) 1 % GEL Apply 2-4 g topically 4 (four) times daily as needed (for knee pain.).    [name] fluticasone (FLONASE) 50 MCG/ACT nasal spray Place 1 spray into both nostrils as needed for allergies or rhinitis. Aller-Flo    . furosemide (LASIX) 20 MG tablet TAKE 1 TABLET (20 MG TOTAL) BY MOUTH DAILY. 90 tablet 3  . gabapentin (NEURONTIN) 100 MG capsule TAKE 1 CAPSULE BY MOUTH THREE TIMES A DAY 270 capsule 1  . levothyroxine (SYNTHROID) 50 MCG tablet Take 50 mcg by mouth daily before breakfast.    . Multiple Vitamin (MULTIVITAMIN WITH MINERALS) TABS tablet Take 1 tablet by  mouth daily.    . OPSUMIT 10 MG tablet TAKE 1 TABLET BY MOUTH DAILY. DO NOT HANDLE IF PREGNANT. DO NOT SPLIT,CRUSH, OR CHEW. REVIEW MEDICATION GUIDE. CALL [phone] TO REFILL. 30 tablet 5  . Probiotic Product (PROBIOTIC PO) Take 1 capsule by mouth daily.    . Vitamin D, Ergocalciferol, (DRISDOL) 1.25 MG (50000 UT) CAPS capsule Take 1 capsule (50,000 Units total) by mouth every 7 (seven) days. 4 capsule 0   No current facility-administered medications on file prior to visit.     PAST MEDICAL HISTORY: Past Medical History:  Diagnosis Date  . Anemia    as child in first grade  . Anxiety   . Arthritis   . Back pain   . Constipation   . CREST syndrome (HCC)   . Dyspnea   . Family history of adverse reaction to anesthesia    mom has had problems in past - N/V post op on one occasion; difficult time waking up on another occasion  .  Gallbladder problem   . GERD (gastroesophageal reflux disease)   . Gestational diabetes mellitus in childbirth, diet controlled 1987  . IBS (irritable bowel syndrome)   . Joint pain   . Leg edema   . Menopause   . Osteoarthritis (arthritis due to wear and tear of joints)   . PAH (pulmonary artery hypertension) (HCC)   . Pulmonary fibrosis (HCC)   . Raynaud disease    hands-toes  . Scleroderma (HCC)   . Shortness of breath dyspnea    pulmonary fibrosis-scleraderma  . Sleep apnea   . Swallowing difficulty   . Thyroid cyst   . Vitamin D deficiency   . Wears glasses     PAST SURGICAL HISTORY: Past Surgical History:  Procedure Laterality Date  . CARDIAC CATHETERIZATION N/A 11/25/2015   Procedure: Right Heart Cath;  Surgeon: Dolores Patty, MD;  Location: [facility] INVASIVE CV LAB;  Service: Cardiovascular;  Laterality: N/A;  . CESAREAN SECTION  87,95  . CHOLECYSTECTOMY  1996   lap choli  . COLONOSCOPY    . ESOPHAGOGASTRODUODENOSCOPY (EGD) WITH PROPOFOL N/A 02/12/2017   Procedure: ESOPHAGOGASTRODUODENOSCOPY (EGD) WITH PROPOFOL;  Surgeon: Vida Rigger,  MD;  Location: [facility] ENDOSCOPY;  Service: Endoscopy;  Laterality: N/A;  . HIATAL HERNIA REPAIR  07/26/2015   Procedure: LAPAROSCOPIC REPAIR OF HIATAL HERNIA;  Surgeon: Luretha Murphy, MD;  Location: [facility] ORS;  Service: General;;  . LAPAROSCOPIC GASTRIC SLEEVE RESECTION N/A 07/26/2015   Procedure: LAPAROSCOPIC GASTRIC SLEEVE RESECTION;  Surgeon: Luretha Murphy, MD;  Location: [facility] ORS;  Service: General;  Laterality: N/A;  . LEFT AND RIGHT HEART CATHETERIZATION WITH CORONARY ANGIOGRAM N/A 03/14/2015   Procedure: LEFT AND RIGHT HEART CATHETERIZATION WITH CORONARY ANGIOGRAM;  Surgeon: Peter M Swaziland, MD;  Location: [facility] CATH LAB;  Service: Cardiovascular;  Laterality: N/A;  . POSTERIOR CERVICAL FUSION/FORAMINOTOMY N/A 01/16/2018   Procedure: Cervical one-two Posterior instrumentation and fusion;  Surgeon: Tressie Stalker, MD;  Location: [facility] OR;  Service: Neurosurgery;  Laterality: N/A;  . RADIAL HEAD ARTHROPLASTY Right 07/28/2014   Procedure: RIGHT RADIAL HEAD REPLACEMENT;  Surgeon: Dairl Ponder, MD;  Location: [facility];  Service: Orthopedics;  Laterality: Right;  . RIGHT HEART CATH N/A 03/18/2017   Procedure: Right Heart Cath;  Surgeon: Dolores Patty, MD;  Location: [facility] INVASIVE CV LAB;  Service: Cardiovascular;  Laterality: N/A;  . SAVORY DILATION N/A 02/12/2017   Procedure: SAVORY DILATION;  Surgeon: Vida Rigger, MD;  Location: [facility] ENDOSCOPY;  Service: Endoscopy;  Laterality: N/A;  . TUBAL LIGATION    . UPPER GI ENDOSCOPY  07/26/2015   Procedure: UPPER GI ENDOSCOPY;  Surgeon: Luretha Murphy, MD;  Location: [facility] ORS;  Service: General;;  . VAGINAL HYSTERECTOMY  2010   LAVH    SOCIAL HISTORY: Social History   Tobacco Use  . Smoking status: Never Smoker  . Smokeless tobacco: Never Used  Substance Use Topics  . Alcohol use: Yes    Alcohol/week: 0.0 standard drinks    Comment: seldom  . Drug use: No    FAMILY HISTORY: Family History  Problem Relation Age of Onset  . Heart failure  Mother        Stent put in 2014 for blockage  . COPD Mother   . Hypertension Mother   . Hyperlipidemia Mother   . Heart disease Mother   . Thyroid disease Mother   . Cancer Mother   . Depression Mother   . Anxiety disorder Mother   . Heart disease Father   . Heart  disease Son        Repair ASD  . Neuropathy Neg Hx     ROS: Review of Systems  Constitutional: Negative for weight loss.  Psychiatric/Behavioral: Positive for depression.    PHYSICAL EXAM: Blood pressure 103/71, pulse (!) 58, temperature 97.9 F (36.6 C), temperature source Oral, height 5\' 4"  (1.626 m), weight 214 lb (97.1 kg), SpO2 100 %. Body mass index is 36.73 kg/m. Physical Exam Vitals signs reviewed.  Constitutional:      Appearance: Normal appearance. She is obese.  Cardiovascular:     Rate and Rhythm: Normal rate.  Pulmonary:     Effort: Pulmonary effort is normal.  Musculoskeletal: Normal range of motion.  Skin:    General: Skin is warm and dry.  Neurological:     Mental Status: She is alert and oriented to person, place, and time.  Psychiatric:        Mood and Affect: Mood normal.        Behavior: Behavior normal.     RECENT LABS AND TESTS: BMET    Component Value Date/Time   NA 145 (H) 11/10/2019 0805   K 4.3 11/10/2019 0805   CL 108 (H) 11/10/2019 0805   CO2 25 11/10/2019 0805   GLUCOSE 87 11/10/2019 0805   GLUCOSE 86 01/13/2018 1000   BUN 14 11/10/2019 0805   CREATININE 0.85 11/10/2019 0805   CREATININE 0.75 03/08/2015 1501   CALCIUM 9.3 11/10/2019 0805   GFRNONAA 78 11/10/2019 0805   GFRNONAA >89 03/08/2015 1501   GFRAA 90 11/10/2019 0805   GFRAA >89 03/08/2015 1501   Lab Results  Component Value Date   HGBA1C 5.0 11/10/2019   HGBA1C 4.9 06/25/2018   HGBA1C 4.9 12/10/2017   HGBA1C 5.0 09/10/2017   Lab Results  Component Value Date   INSULIN 9.9 11/10/2019   INSULIN 8.2 06/25/2018   INSULIN 4.4 12/10/2017   INSULIN 6.1 09/10/2017   CBC    Component Value Date/Time    WBC 4.8 11/10/2019 0805   WBC 6.2 01/13/2018 1000   RBC 4.28 11/10/2019 0805   RBC 4.43 01/13/2018 1000   HGB 13.0 11/10/2019 0805   HCT 39.0 11/10/2019 0805   PLT 227 11/10/2019 0805   MCV 91 11/10/2019 0805   MCH 30.4 11/10/2019 0805   MCH 30.5 01/13/2018 1000   MCHC 33.3 11/10/2019 0805   MCHC 33.2 01/13/2018 1000   RDW 12.3 11/10/2019 0805   LYMPHSABS 1.8 11/10/2019 0805   MONOABS 0.9 07/28/2015 0455   EOSABS 0.1 11/10/2019 0805   BASOSABS 0.1 11/10/2019 0805   Iron/TIBC/Ferritin/ %Sat No results found for: IRON, TIBC, FERRITIN, IRONPCTSAT Lipid Panel     Component Value Date/Time   CHOL 159 11/10/2019 0805   TRIG 96 11/10/2019 0805   HDL 71 11/10/2019 0805   LDLCALC 71 11/10/2019 0805   Hepatic Function Panel     Component Value Date/Time   PROT 5.9 (L) 11/10/2019 0805   ALBUMIN 3.7 (L) 11/10/2019 0805   AST 18 11/10/2019 0805   ALT 12 11/10/2019 0805   ALKPHOS 92 11/10/2019 0805   BILITOT 0.5 11/10/2019 0805      Component Value Date/Time   TSH 2.860 11/10/2019 0805   TSH 2.00 10/03/2018   TSH 2.690 09/10/2017 1108   Results for JANNICE, BEITZEL (MRN [id number]) as of 11/17/2019 15:48  Ref. Range 11/10/2019 08:05  Vitamin D, 25-Hydroxy Latest Ref Range: 30.0 - 100.0 ng/mL 33.9   OBESITY BEHAVIORAL INTERVENTION VISIT  Today's visit  was # 23   Starting weight: 207 lbs Starting date: 09/10/2017 Today's weight : Weight: 214 lb (97.1 kg)  Today's date: 11/17/2019 Total lbs lost to date: 0    11/16/2019  Height  (1.626 m)  Weight 214 lb (97.1 kg)  BMI (Calculated) 36.72  BLOOD PRESSURE - SYSTOLIC 103  BLOOD PRESSURE - DIASTOLIC 71   Body Fat % 51.1 %   ASK: We discussed the diagnosis of obesity with Burley Saver today and Kharisma agreed to give [place] permission to discuss obesity behavioral modification therapy today.  ASSESS: Seda has the diagnosis of obesity and her BMI today is 36.72.  Augustina is in the action stage of change.    ADVISE: Rasa was educated on the multiple health risks of obesity as well as the benefit of weight loss to improve her health. She was advised of the need for long term treatment and the importance of lifestyle modifications to improve her current health and to decrease her risk of future health problems.  AGREE: Multiple dietary modification options and treatment options were discussed and Astella agreed to follow the recommendations documented in the above note.  ARRANGE: Savonna was educated on the importance of frequent visits to treat obesity as outlined per CMS and USPSTF guidelines and agreed to schedule her next follow up appointment today.  IKirke Corin, CMA, am acting as transcriptionist for Wilder Glade, MD  I have reviewed the above documentation for accuracy and completeness, and I agree with the above. -Quillian Quince, MD

## 2019-11-23 ENCOUNTER — Ambulatory Visit (INDEPENDENT_AMBULATORY_CARE_PROVIDER_SITE_OTHER): Payer: BC Managed Care – PPO | Admitting: Family Medicine

## 2019-11-26 ENCOUNTER — Other Ambulatory Visit: Payer: Self-pay | Admitting: *Deleted

## 2019-11-26 MED ORDER — BACLOFEN 10 MG PO TABS: ORAL_TABLET | ORAL | 1 refills | Status: DC

## 2019-11-30 ENCOUNTER — Other Ambulatory Visit: Payer: Self-pay

## 2019-11-30 DIAGNOSIS — Z20822 Contact with and (suspected) exposure to covid-19: Secondary | ICD-10-CM

## 2019-12-01 LAB — NOVEL CORONAVIRUS, NAA: SARS-CoV-2, NAA: NOT DETECTED

## 2019-12-07 ENCOUNTER — Encounter (INDEPENDENT_AMBULATORY_CARE_PROVIDER_SITE_OTHER): Payer: Self-pay | Admitting: Family Medicine

## 2019-12-07 ENCOUNTER — Ambulatory Visit (INDEPENDENT_AMBULATORY_CARE_PROVIDER_SITE_OTHER): Payer: BC Managed Care – PPO | Admitting: Family Medicine

## 2019-12-07 ENCOUNTER — Other Ambulatory Visit: Payer: Self-pay

## 2019-12-07 VITALS — BP 114/73 | HR 64 | Temp 97.8°F | Ht 64.0 in | Wt 213.0 lb

## 2019-12-07 DIAGNOSIS — Z6836 Body mass index (BMI) 36.0-36.9, adult: Secondary | ICD-10-CM

## 2019-12-07 DIAGNOSIS — E559 Vitamin D deficiency, unspecified: Secondary | ICD-10-CM | POA: Diagnosis not present

## 2019-12-07 DIAGNOSIS — F3289 Other specified depressive episodes: Secondary | ICD-10-CM | POA: Diagnosis not present

## 2019-12-08 ENCOUNTER — Other Ambulatory Visit (INDEPENDENT_AMBULATORY_CARE_PROVIDER_SITE_OTHER): Payer: Self-pay | Admitting: Family Medicine

## 2019-12-08 DIAGNOSIS — F3289 Other specified depressive episodes: Secondary | ICD-10-CM

## 2019-12-08 NOTE — Progress Notes (Signed)
Office: [phone]  /  Fax: [phone]   HPI:  Chief Complaint: OBESITY Rachael Camacho is here to discuss her progress with her obesity treatment plan. She is on the Category 2 plan and states she is following her eating plan approximately 10% of the time. She states she is exercising 0 minutes 0 times per week.  [name] has not followed her plan closely this month, but has done well with portion control and making smarter choices even with some celebration eating. Hunger is controlled.  Today's visit was #24 Starting weight: 207 lbs Starting date: 09/10/2017 Today's weight: 213 lbs  Today's date: 12/07/2019 Total lbs lost to date: 0 Total lbs lost since last in-office visit: 1  Vitamin D deficiency [name] has a diagnosis of Vitamin D deficiency, which is not yet at goal. Last Vitamin D 33.9 on 11/10/2019. There was a misunderstanding and she stopped her Vitamin D. She has a month prescription at home.  Emotional Eating [name] started Topamax at 25 mg QHS and felt it was starting to help, but it made her tired and she had a lot of driving to do so she stopped taking it.  ASSESSMENT AND PLAN:  Vitamin D deficiency  Other depression, with emotional eating  Class 2 severe obesity with serious comorbidity and body mass index (BMI) of 36.0 to 36.9 in adult, unspecified obesity type (HCC)  PLAN:  Vitamin D Deficiency [name] was informed that low Vitamin D levels contributes to fatigue and are associated with obesity, breast, and colon cancer. She agrees to restart Vitamin D weekly (no refill needed) and will follow-up for routine testing of Vitamin D in 2-3 months. She was informed of the risk of over-replacement of Vitamin D and agrees to not increase her dose unless she discusses this with [place] first. Meka agrees to follow-up with our clinic in 4 weeks.  Emotional Eating  Behavior modification techniques were discussed today to help [name] deal with her emotional/non-hunger eating  behaviors. Sheng was advised it was okay to hold Topamax for now. She may try it again in the future when she has less driving to do.  I spent > than 50% of the 21 minute visit on counseling as documented in the note.    Obesity [name] is currently in the action stage of change. As such, her goal is to continue with weight loss efforts. She has agreed to follow the Category 2 plan. [name] has been instructed to work up to a goal of 150 minutes of combined cardio and strengthening exercise per week for weight loss and overall health benefits. We discussed the following Behavioral Modification Strategies today: holiday eating strategies.  [name] has agreed to follow-up with our clinic in 4 weeks. She was informed of the importance of frequent follow-up visits to maximize her success with intensive lifestyle modifications for her multiple health conditions.  ALLERGIES: Allergies  Allergen Reactions  . Amoxicillin Other (See Comments)    Immediate yeast infection  Has patient had a PCN reaction causing immediate rash, facial/tongue/throat swelling, SOB or lightheadedness with hypotension: No Has patient had a PCN reaction causing severe rash involving mucus membranes or skin necrosis: No Has patient had a PCN reaction that required hospitalization: No Has patient had a PCN reaction occurring within the last 10 years: No If all of the above answers are "NO", then may proceed with Cephalosporin use.   [name] Percocet [Oxycodone-Acetaminophen] Itching    Tolerates acetaminophen alone  . [name] [Nitrofurantoin Monohyd Macro] Rash and Hives  .  [name] Hives and Rash    Reported chicken pox-like rash, not hives  . Sulfa Antibiotics Rash and Hives    MEDICATIONS: Current Outpatient Medications on File Prior to Visit  Medication Sig Dispense Refill  . amLODipine (NORVASC) 5 MG tablet TAKE 1/2 TABLET BY MOUTH X2 WEEKS THEN INCREASE TO 1 TABLET ONCE A DAY    . baclofen (LIORESAL) 10 MG  tablet TAKE 1 TABLET BY MOUTH DAILY. 90 tablet 1  . Biotin 5000 MCG CAPS Take 5,000 mcg by mouth daily.    . Cyanocobalamin (VITAMIN B-12) 5000 MCG SUBL Take 5,000 mcg by mouth daily.    [name] desloratadine (CLARINEX) 5 MG tablet Take 5 mg by mouth at bedtime.   12  . diclofenac sodium (VOLTAREN) 1 % GEL Apply 2-4 g topically 4 (four) times daily as needed (for knee pain.).    [name] fluticasone (FLONASE) 50 MCG/ACT nasal spray Place 1 spray into both nostrils as needed for allergies or rhinitis. Aller-Flo    . furosemide (LASIX) 20 MG tablet TAKE 1 TABLET (20 MG TOTAL) BY MOUTH DAILY. 90 tablet 3  . gabapentin (NEURONTIN) 100 MG capsule TAKE 1 CAPSULE BY MOUTH THREE TIMES A DAY 270 capsule 1  . levothyroxine (SYNTHROID) 50 MCG tablet Take 50 mcg by mouth daily before breakfast.    . Multiple Vitamin (MULTIVITAMIN WITH MINERALS) TABS tablet Take 1 tablet by mouth daily.    . OPSUMIT 10 MG tablet TAKE 1 TABLET BY MOUTH DAILY. DO NOT HANDLE IF PREGNANT. DO NOT SPLIT,CRUSH, OR CHEW. REVIEW MEDICATION GUIDE. CALL [phone] TO REFILL. 30 tablet 5  . Probiotic Product (PROBIOTIC PO) Take 1 capsule by mouth daily.    . Vitamin D, Ergocalciferol, (DRISDOL) 1.25 MG (50000 UT) CAPS capsule Take 1 capsule (50,000 Units total) by mouth every 7 (seven) days. 4 capsule 0   No current facility-administered medications on file prior to visit.    PAST MEDICAL HISTORY: Past Medical History:  Diagnosis Date  . Anemia    as child in first grade  . Anxiety   . Arthritis   . Back pain   . Constipation   . CREST syndrome (HCC)   . Dyspnea   . Family history of adverse reaction to anesthesia    mom has had problems in past - N/V post op on one occasion; difficult time waking up on another occasion  . Gallbladder problem   . GERD (gastroesophageal reflux disease)   . Gestational diabetes mellitus in childbirth, diet controlled 1987  . IBS (irritable bowel syndrome)   . Joint pain   . Leg edema   . Menopause     . Osteoarthritis (arthritis due to wear and tear of joints)   . PAH (pulmonary artery hypertension) (HCC)   . Pulmonary fibrosis (HCC)   . Raynaud disease    hands-toes  . Scleroderma (HCC)   . Shortness of breath dyspnea    pulmonary fibrosis-scleraderma  . Sleep apnea   . Swallowing difficulty   . Thyroid cyst   . Vitamin D deficiency   . Wears glasses     PAST SURGICAL HISTORY: Past Surgical History:  Procedure Laterality Date  . CARDIAC CATHETERIZATION N/A 11/25/2015   Procedure: Right Heart Cath;  Surgeon: Dolores Patty, MD;  Location: [facility] INVASIVE CV LAB;  Service: Cardiovascular;  Laterality: N/A;  . CESAREAN SECTION  87,95  . CHOLECYSTECTOMY  1996   lap choli  . COLONOSCOPY    . ESOPHAGOGASTRODUODENOSCOPY (EGD) WITH PROPOFOL N/A  02/12/2017   Procedure: ESOPHAGOGASTRODUODENOSCOPY (EGD) WITH PROPOFOL;  Surgeon: Vida RiggerMarc Magod, MD;  Location: [facility];  Service: Endoscopy;  Laterality: N/A;  . HIATAL HERNIA REPAIR  07/26/2015   Procedure: LAPAROSCOPIC REPAIR OF HIATAL HERNIA;  Surgeon: Luretha MurphyMatthew Camuso, MD;  Location: [facility] ORS;  Service: General;;  . LAPAROSCOPIC GASTRIC SLEEVE RESECTION N/A 07/26/2015   Procedure: LAPAROSCOPIC GASTRIC SLEEVE RESECTION;  Surgeon: Luretha MurphyMatthew Stockman, MD;  Location: [facility]S;  Service: General;  Laterality: N/A;  . LEFT AND RIGHT HEART CATHETERIZATION WITH CORONARY ANGIOGRAM N/A 03/14/2015   Procedure: LEFT AND RIGHT HEART CATHETERIZATION WITH CORONARY ANGIOGRAM;  Surgeon: Peter M SwazilandJordan, MD;  [facility];  Service: Cardiovascular;  Laterality: N/A;  . POSTERIOR CERVICAL FUSION/FORAMINOTOMY N/A 01/16/2018   Procedure: Cervical one-two Posterior instrumentation and fusion;  Surgeon: Tressie StalkerJenkins, Jeffrey, MD;  [facility];  Service: Neurosurgery;  Laterality: N/A;  . RADIAL HEAD ARTHROPLASTY Right 07/28/2014   Procedure: RIGHT RADIAL HEAD REPLACEMENT;  Surgeon: Dairl PonderMatthew Weingold, MD;  Location: [facility];  Service: Orthopedics;   Laterality: Right;  . RIGHT HEART CATH N/A 03/18/2017   Procedure: Right Heart Cath;  Surgeon: Dolores Pattyaniel R Bensimhon, MD;  [facility];  Service: Cardiovascular;  Laterality: N/A;  . SAVORY DILATION N/A 02/12/2017   Procedure: SAVORY DILATION;  Surgeon: Vida RiggerMarc Magod, MD;  Location: [facility];  Service: Endoscopy;  Laterality: N/A;  . TUBAL LIGATION    . UPPER GI ENDOSCOPY  07/26/2015   Procedure: UPPER GI ENDOSCOPY;  Surgeon: Luretha MurphyMatthew Burges, MD;  Location: [facility]S;  Service: General;;  . VAGINAL HYSTERECTOMY  2010   LAVH    SOCIAL HISTORY: Social History   Tobacco Use  . Smoking status: Never Smoker  . Smokeless tobacco: Never Used  Substance Use Topics  . Alcohol use: Yes    Alcohol/week: 0.0 standard drinks    Comment: seldom  . Drug use: No    FAMILY HISTORY: Family History  Problem Relation Age of Onset  . Heart failure Mother        Stent put in 2014 for blockage  . COPD Mother   . Hypertension Mother   . Hyperlipidemia Mother   . Heart disease Mother   . Thyroid disease Mother   . Cancer Mother   . Depression Mother   . Anxiety disorder Mother   . Heart disease Father   . Heart disease Son        Repair ASD  . Neuropathy Neg Hx    ROS: ROS none noted.  PHYSICAL EXAM: Blood pressure 114/73, pulse 64, temperature 97.8 F (36.6 C), temperature source Oral, height 5\' 4"  (1.626 m), weight 213 lb (96.6 kg), SpO2 96 %. Body mass index is 36.56 kg/m. Physical Exam Vitals reviewed.  Constitutional:      Appearance: Normal appearance. She is obese.  Cardiovascular:     Rate and Rhythm: Normal rate.     Pulses: Normal pulses.  Pulmonary:     Effort: Pulmonary effort is normal.     Breath sounds: Normal breath sounds.  Musculoskeletal:        General: Normal range of motion.  Skin:    General: Skin is warm and dry.  Neurological:     Mental Status: She is alert and oriented to person, place, and time.  Psychiatric:        Behavior: Behavior  normal.   RECENT LABS AND TESTS: BMET    Component Value Date/Time   NA 145 (H) 11/10/2019  0805   K 4.3 11/10/2019 0805   CL 108 (H) 11/10/2019 0805   CO2 25 11/10/2019 0805   GLUCOSE 87 11/10/2019 0805   GLUCOSE 86 01/13/2018 1000   BUN 14 11/10/2019 0805   CREATININE 0.85 11/10/2019 0805   CREATININE 0.75 03/08/2015 1501   CALCIUM 9.3 11/10/2019 0805   GFRNONAA 78 11/10/2019 0805   GFRNONAA >89 03/08/2015 1501   GFRAA 90 11/10/2019 0805   GFRAA >89 03/08/2015 1501   Lab Results  Component Value Date   HGBA1C 5.0 11/10/2019   HGBA1C 4.9 06/25/2018   HGBA1C 4.9 12/10/2017   HGBA1C 5.0 09/10/2017   Lab Results  Component Value Date   INSULIN 9.9 11/10/2019   INSULIN 8.2 06/25/2018   INSULIN 4.4 12/10/2017   INSULIN 6.1 09/10/2017   CBC    Component Value Date/Time   WBC 4.8 11/10/2019 0805   WBC 6.2 01/13/2018 1000   RBC 4.28 11/10/2019 0805   RBC 4.43 01/13/2018 1000   HGB 13.0 11/10/2019 0805   HCT 39.0 11/10/2019 0805   PLT 227 11/10/2019 0805   MCV 91 11/10/2019 0805   MCH 30.4 11/10/2019 0805   MCH 30.5 01/13/2018 1000   MCHC 33.3 11/10/2019 0805   MCHC 33.2 01/13/2018 1000   RDW 12.3 11/10/2019 0805   LYMPHSABS 1.8 11/10/2019 0805   MONOABS 0.9 07/28/2015 0455   EOSABS 0.1 11/10/2019 0805   BASOSABS 0.1 11/10/2019 0805   Iron/TIBC/Ferritin/ %Sat No results found for: IRON, TIBC, FERRITIN, IRONPCTSAT Lipid Panel     Component Value Date/Time   CHOL 159 11/10/2019 0805   TRIG 96 11/10/2019 0805   HDL 71 11/10/2019 0805   LDLCALC 71 11/10/2019 0805   Hepatic Function Panel     Component Value Date/Time   PROT 5.9 (L) 11/10/2019 0805   ALBUMIN 3.7 (L) 11/10/2019 0805   AST 18 11/10/2019 0805   ALT 12 11/10/2019 0805   ALKPHOS 92 11/10/2019 0805   BILITOT 0.5 11/10/2019 0805      Component Value Date/Time   TSH 2.860 11/10/2019 0805   TSH 2.00 10/03/2018 0000   TSH 2.690 09/10/2017 1108      I, Michaelene Song, am acting as  Location manager for Dennard Nip, MD I have reviewed the above documentation for accuracy and completeness, and I agree with the above. -Dennard Nip, MD

## 2020-01-04 ENCOUNTER — Encounter (INDEPENDENT_AMBULATORY_CARE_PROVIDER_SITE_OTHER): Payer: Self-pay | Admitting: Family Medicine

## 2020-01-04 ENCOUNTER — Ambulatory Visit (INDEPENDENT_AMBULATORY_CARE_PROVIDER_SITE_OTHER): Payer: BC Managed Care – PPO | Admitting: Family Medicine

## 2020-01-04 ENCOUNTER — Other Ambulatory Visit: Payer: Self-pay

## 2020-01-04 VITALS — BP 127/82 | HR 58 | Temp 98.1°F | Ht 64.0 in | Wt 210.0 lb

## 2020-01-04 DIAGNOSIS — E559 Vitamin D deficiency, unspecified: Secondary | ICD-10-CM

## 2020-01-04 DIAGNOSIS — Z6836 Body mass index (BMI) 36.0-36.9, adult: Secondary | ICD-10-CM

## 2020-01-04 DIAGNOSIS — Z9189 Other specified personal risk factors, not elsewhere classified: Secondary | ICD-10-CM | POA: Diagnosis not present

## 2020-01-04 MED ORDER — VITAMIN D (ERGOCALCIFEROL) 1.25 MG (50000 UNIT) PO CAPS: 50000.0000 [IU] | ORAL_CAPSULE | ORAL | 0 refills | Status: DC

## 2020-01-06 NOTE — Progress Notes (Signed)
Chief Complaint:   OBESITY Rachael Camacho is here to discuss her progress with her obesity treatment plan along with follow-up of her obesity related diagnoses. Rachael Camacho is on the Category 2 Plan and states she is following her eating plan approximately 10% of the time. Rachael Camacho states she is doing 0 minutes 0 times per week.  Today's visit was #: 25 Starting weight: 207 lbs Starting date: 09/10/17 Today's weight: 210 lbs Today's date: 01/04/2020 Total lbs lost to date: 0 Total lbs lost since last in-office visit: 3  Interim History: Amandy has done well with her weight loss efforts over the holidays. She has not been following her Category 2 plan closely, but she is ready to get back on track.  Subjective:   1. Vitamin D deficiency Rachael Camacho is stable on Vit D, and she denies nausea, vomiting, or muscle weakness.  2. At risk for heart disease Rachael Camacho is at a higher than average risk for cardiovascular disease due to obesity. Reviewed: no chest pain on exertion, no dyspnea on exertion, and no swelling of ankles.  Assessment/Plan:   1. Vitamin D deficiency Low Vitamin D level contributes to fatigue and are associated with obesity, breast, and colon cancer. We will refill prescription Vit D for 1 month. She will follow-up for routine testing of vitamin D, at least 2-3 times per year to avoid over-replacement. We will recheck labs in 6 weeks.  - Vitamin D, Ergocalciferol, (DRISDOL) 1.25 MG (50000 UNIT) CAPS capsule; Take 1 capsule (50,000 Units total) by mouth every 7 (seven) days.  Dispense: 4 capsule; Refill: 0  2. At risk for heart disease Rachael Camacho was given approximately 15 minutes of coronary artery disease prevention counseling today. She is 54 y.o. female and has risk factors for heart disease including obesity. We discussed intensive lifestyle modifications today with an emphasis on specific weight loss instructions and strategies.   3. Class 2 severe obesity with serious  comorbidity and body mass index (BMI) of 36.0 to 36.9 in adult, unspecified obesity type (HCC) Rachael Camacho is currently in the action stage of change. As such, her goal is to continue with weight loss efforts. She has agreed to on the Category 2 Plan.   We discussed the following exercise goals today: For substantial health benefits, adults should do at least 150 minutes (2 hours and 30 minutes) a week of moderate-intensity, or 75 minutes (1 hour and 15 minutes) a week of vigorous-intensity aerobic physical activity, or an equivalent combination of moderate- and vigorous-intensity aerobic activity. Aerobic activity should be performed in episodes of at least 10 minutes, and preferably, it should be spread throughout the week. Adults should also include muscle-strengthening activities that involve all major muscle groups on 2 or more days a week.  We discussed the following behavioral modification strategies today: meal planning and cooking strategies.  Rachael Camacho has agreed to follow-up with our clinic in 6 weeks. She was informed of the importance of frequent follow-up visits to maximize her success with intensive lifestyle modifications for her multiple health conditions.   Objective:   Blood pressure 127/82, pulse (!) 58, temperature 98.1 F (36.7 C), temperature source Oral, height 5\' 4"  (1.626 m), weight 210 lb (95.3 kg), SpO2 96 %. Body mass index is 36.05 kg/m.  General: Cooperative, alert, well developed, in no acute distress. HEENT: Conjunctivae and lids unremarkable. Neck: No thyromegaly.  Cardiovascular: Regular rhythm.  Lungs: Normal work of breathing. Extremities: No edema.  Neurologic: No focal deficits.  Lab Results  Component Value Date   CREATININE 0.85 11/10/2019   BUN 14 11/10/2019   NA 145 (H) 11/10/2019   K 4.3 11/10/2019   CL 108 (H) 11/10/2019   CO2 25 11/10/2019   Lab Results  Component Value Date   ALT 12 11/10/2019   AST 18 11/10/2019   ALKPHOS 92  11/10/2019   BILITOT 0.5 11/10/2019   Lab Results  Component Value Date   HGBA1C 5.0 11/10/2019   HGBA1C 4.9 06/25/2018   HGBA1C 4.9 12/10/2017   HGBA1C 5.0 09/10/2017   Lab Results  Component Value Date   INSULIN 9.9 11/10/2019   INSULIN 8.2 06/25/2018   INSULIN 4.4 12/10/2017   INSULIN 6.1 09/10/2017   Lab Results  Component Value Date   TSH 2.860 11/10/2019   Lab Results  Component Value Date   CHOL 159 11/10/2019   HDL 71 11/10/2019   LDLCALC 71 11/10/2019   TRIG 96 11/10/2019   Lab Results  Component Value Date   WBC 4.8 11/10/2019   HGB 13.0 11/10/2019   HCT 39.0 11/10/2019   MCV 91 11/10/2019   PLT 227 11/10/2019   No results found for: IRON, TIBC, FERRITIN  Attestation Statements:   Reviewed by clinician on day of visit: allergies, medications, problem list, medical history, surgical history, family history, social history, and previous encounter notes.   I, Kaley Jutras, am acting as transcriptionist for Dennard Nip, MD.  I have reviewed the above documentation for accuracy and completeness, and I agree with the above. -  Dennard Nip, MD

## 2020-02-15 ENCOUNTER — Ambulatory Visit (INDEPENDENT_AMBULATORY_CARE_PROVIDER_SITE_OTHER): Payer: BC Managed Care – PPO | Admitting: Family Medicine

## 2020-03-04 ENCOUNTER — Other Ambulatory Visit (HOSPITAL_COMMUNITY): Payer: Self-pay

## 2020-03-04 MED ORDER — OPSUMIT 10 MG PO TABS: 10.0000 mg | ORAL_TABLET | Freq: Every day | ORAL | 1 refills | Status: DC

## 2020-03-18 ENCOUNTER — Other Ambulatory Visit (HOSPITAL_COMMUNITY): Payer: Self-pay | Admitting: Internal Medicine

## 2020-03-23 ENCOUNTER — Other Ambulatory Visit: Payer: Self-pay

## 2020-03-23 ENCOUNTER — Encounter (INDEPENDENT_AMBULATORY_CARE_PROVIDER_SITE_OTHER): Payer: Self-pay | Admitting: Family Medicine

## 2020-03-23 ENCOUNTER — Ambulatory Visit (INDEPENDENT_AMBULATORY_CARE_PROVIDER_SITE_OTHER): Payer: BC Managed Care – PPO | Admitting: Family Medicine

## 2020-03-23 VITALS — BP 108/77 | HR 78 | Temp 98.1°F | Ht 64.0 in | Wt 206.0 lb

## 2020-03-23 DIAGNOSIS — E559 Vitamin D deficiency, unspecified: Secondary | ICD-10-CM | POA: Diagnosis not present

## 2020-03-23 DIAGNOSIS — I10 Essential (primary) hypertension: Secondary | ICD-10-CM

## 2020-03-23 DIAGNOSIS — Z6835 Body mass index (BMI) 35.0-35.9, adult: Secondary | ICD-10-CM

## 2020-03-23 DIAGNOSIS — Z9189 Other specified personal risk factors, not elsewhere classified: Secondary | ICD-10-CM

## 2020-03-23 MED ORDER — VITAMIN D (ERGOCALCIFEROL) 1.25 MG (50000 UNIT) PO CAPS: 50000.0000 [IU] | ORAL_CAPSULE | ORAL | 0 refills | Status: DC

## 2020-03-23 NOTE — Progress Notes (Signed)
Chief Complaint:   OBESITY Rachael Camacho is here to discuss her progress with her obesity treatment plan along with follow-up of her obesity related diagnoses. Rachael Camacho is on the Category 2 Plan and states she is following her eating plan approximately 0% of the time. Rachael Camacho states she is doing 0 minutes 0 times per week.  Today's visit was #: 26 Starting weight: 207 lbs Starting date: 09/10/2017 Today's weight: 206 lbs Today's date: 03/23/2020 Total lbs lost to date: 1 Total lbs lost since last in-office visit: 4  Interim History: Rachael Camacho has not been following the meal plan. She feels that her depression has increased and her therapist recommended that she speak with her primary care physician about a prescription. Her primary car physician placed her on a trial of Cymbalta, and she experienced severe fatigue. She is now on Trentellix 5 mg q daily for the last week, and she denies excessive fatigue and feels that her mood is stable. She is seeing her therapist monthly.  Subjective:   1. Vitamin D deficiency Rachael Camacho's Vit D level on 11/10/2019 was 33.9. She is on prescription strength Vit D supplementation.  2. Essential hypertension Rachael Camacho is currently on amlodipine 5 mg q daily and Lasix 20 mg. She reports intermittent dizziness with position changes. I recommended increasing water intake.  3. At risk for dehydration Rachael Camacho is at risk for dehydration due to Lasix.  Assessment/Plan:   1. Vitamin D deficiency Low Vitamin D level contributes to fatigue and are associated with obesity, breast, and colon cancer. We will refill prescription Vitamin D for 2 months. Rachael Camacho will follow-up for routine testing of Vitamin D, at least 2-3 times per year to avoid over-replacement. We will check labs today.  - VITAMIN D 25 Hydroxy (Vit-D Deficiency, Fractures)  2. Essential hypertension Rachael Camacho is working on healthy weight loss and exercise to improve blood pressure control. We will watch for signs  of hypotension as she continues her lifestyle modifications. Rachael Camacho is to check her blood pressure at home, and take her readings to her follow up with prescribing provider. We will check labs today.  - Comprehensive metabolic panel - CBC with Differential/Platelet - Hemoglobin A1c - Insulin, random - Lipid Panel With LDL/HDL Ratio - T3 - T4, free - TSH  3. At risk for dehydration Rachael Camacho was given approximately 15 minutes dehydration prevention counseling today. Rachael Camacho is at risk for dehydration due to Lasix, recommended to increase plain water intake. She was encouraged to hydrate and monitor fluid status to avoid dehydration as well as weight loss plateaus.   4. Class 2 severe obesity with serious comorbidity and body mass index (BMI) of 35.0 to 35.9 in adult, unspecified obesity type (HCC) Rachael Camacho is currently in the action stage of change. As such, her goal is to continue with weight loss efforts. She has agreed to the Category 2 Plan.   Exercise goals: No exercise has been prescribed at this time.  Behavioral modification strategies: increasing water intake, no skipping meals and meal planning and cooking strategies.  Rachael Camacho has agreed to follow-up with our clinic in 6 weeks. She was informed of the importance of frequent follow-up visits to maximize her success with intensive lifestyle modifications for her multiple health conditions.   Rachael Camacho was informed we would discuss her lab results at her next visit unless there is a critical issue that needs to be addressed sooner. Rachael Camacho agreed to keep her next visit at the agreed upon time to discuss these results.  Objective:   Blood pressure 108/77, pulse 78, temperature 98.1 F (36.7 C), temperature source Oral, height 5\' 4"  (1.626 m), weight 206 lb (93.4 kg), SpO2 93 %. Body mass index is 35.36 kg/m.  General: Cooperative, alert, well developed, in no acute distress. HEENT: Conjunctivae and lids unremarkable. Cardiovascular:  Regular rhythm.  Lungs: Normal work of breathing. Neurologic: No focal deficits.   Lab Results  Component Value Date   CREATININE 0.85 11/10/2019   BUN 14 11/10/2019   NA 145 (H) 11/10/2019   K 4.3 11/10/2019   CL 108 (H) 11/10/2019   CO2 25 11/10/2019   Lab Results  Component Value Date   ALT 12 11/10/2019   AST 18 11/10/2019   ALKPHOS 92 11/10/2019   BILITOT 0.5 11/10/2019   Lab Results  Component Value Date   HGBA1C 5.0 11/10/2019   HGBA1C 4.9 06/25/2018   HGBA1C 4.9 12/10/2017   HGBA1C 5.0 09/10/2017   Lab Results  Component Value Date   INSULIN 9.9 11/10/2019   INSULIN 8.2 06/25/2018   INSULIN 4.4 12/10/2017   INSULIN 6.1 09/10/2017   Lab Results  Component Value Date   TSH 2.860 11/10/2019   Lab Results  Component Value Date   CHOL 159 11/10/2019   HDL 71 11/10/2019   LDLCALC 71 11/10/2019   TRIG 96 11/10/2019   Lab Results  Component Value Date   WBC 4.8 11/10/2019   HGB 13.0 11/10/2019   HCT 39.0 11/10/2019   MCV 91 11/10/2019   PLT 227 11/10/2019   No results found for: IRON, TIBC, FERRITIN  Attestation Statements:   Reviewed by clinician on day of visit: allergies, medications, problem list, medical history, surgical history, family history, social history, and previous encounter notes.   I, Tonnie Stillman, am acting as transcriptionist for Dennard Nip, MD.  I have reviewed the above documentation for accuracy and completeness, and I agree with the above. -  Dennard Nip, MD

## 2020-04-01 LAB — CBC WITH DIFFERENTIAL/PLATELET
Basophils Absolute: 0.1 10*3/uL (ref 0.0–0.2)
Basos: 1 %
EOS (ABSOLUTE): 0.2 10*3/uL (ref 0.0–0.4)
Eos: 2 %
Hematocrit: 41.1 % (ref 34.0–46.6)
Hemoglobin: 13.3 g/dL (ref 11.1–15.9)
Immature Grans (Abs): 0 10*3/uL (ref 0.0–0.1)
Immature Granulocytes: 0 %
Lymphocytes Absolute: 1.9 10*3/uL (ref 0.7–3.1)
Lymphs: 20 %
MCH: 28.8 pg (ref 26.6–33.0)
MCHC: 32.4 g/dL (ref 31.5–35.7)
MCV: 89 fL (ref 79–97)
Monocytes Absolute: 0.6 10*3/uL (ref 0.1–0.9)
Monocytes: 7 %
Neutrophils Absolute: 6.4 10*3/uL (ref 1.4–7.0)
Neutrophils: 70 %
Platelets: 264 10*3/uL (ref 150–450)
RBC: 4.62 x10E6/uL (ref 3.77–5.28)
RDW: 12.3 % (ref 11.7–15.4)
WBC: 9.2 10*3/uL (ref 3.4–10.8)

## 2020-04-01 LAB — COMPREHENSIVE METABOLIC PANEL

## 2020-04-01 LAB — INSULIN, RANDOM

## 2020-04-01 LAB — VITAMIN D 25 HYDROXY (VIT D DEFICIENCY, FRACTURES)

## 2020-04-01 LAB — LIPID PANEL WITH LDL/HDL RATIO

## 2020-04-01 LAB — HEMOGLOBIN A1C
Est. average glucose Bld gHb Est-mCnc: 91 mg/dL
Hgb A1c MFr Bld: 4.8 % (ref 4.8–5.6)

## 2020-04-01 LAB — T4, FREE

## 2020-04-01 LAB — TSH

## 2020-04-01 LAB — T3

## 2020-04-11 NOTE — Progress Notes (Signed)
Was this addressed?

## 2020-04-12 NOTE — Progress Notes (Signed)
This was one of the labs that was mishandled by [facility]. The patient has been contacted to have redrawn.   April, CMA

## 2020-04-26 ENCOUNTER — Other Ambulatory Visit (HOSPITAL_COMMUNITY)
Admission: RE | Admit: 2020-04-26 | Discharge: 2020-04-26 | Disposition: A | Discharge status: Home / Self Care | Payer: BC Managed Care – PPO | Source: Ambulatory Visit | Attending: Pulmonary Disease | Admitting: Pulmonary Disease

## 2020-04-26 DIAGNOSIS — Z01812 Encounter for preprocedural laboratory examination: Secondary | ICD-10-CM | POA: Insufficient documentation

## 2020-04-26 DIAGNOSIS — Z20822 Contact with and (suspected) exposure to covid-19: Secondary | ICD-10-CM | POA: Insufficient documentation

## 2020-04-26 LAB — SARS CORONAVIRUS 2 (TAT 6-24 HRS): SARS Coronavirus 2: NEGATIVE

## 2020-04-29 ENCOUNTER — Ambulatory Visit (INDEPENDENT_AMBULATORY_CARE_PROVIDER_SITE_OTHER): Payer: BC Managed Care – PPO | Admitting: Pulmonary Disease

## 2020-04-29 ENCOUNTER — Other Ambulatory Visit: Payer: Self-pay

## 2020-04-29 DIAGNOSIS — I272 Pulmonary hypertension, unspecified: Secondary | ICD-10-CM | POA: Diagnosis not present

## 2020-04-29 DIAGNOSIS — R0602 Shortness of breath: Secondary | ICD-10-CM

## 2020-04-29 LAB — PULMONARY FUNCTION TEST
DL/VA % pred: 56 %
DL/VA: 2.43 ml/min/mmHg/L
DLCO cor % pred: 53 %
DLCO cor: 10.7 ml/min/mmHg
DLCO unc % pred: 53 %
DLCO unc: 10.7 ml/min/mmHg
FEF 25-75 Post: 3.14 L/sec
FEF 25-75 Pre: 2.77 L/sec
FEF2575-%Change-Post: 13 %
FEF2575-%Pred-Post: 125 %
FEF2575-%Pred-Pre: 110 %
FEV1-%Change-Post: 1 %
FEV1-%Pred-Post: 100 %
FEV1-%Pred-Pre: 99 %
FEV1-Post: 2.62 L
FEV1-Pre: 2.59 L
FEV1FVC-%Change-Post: 5 %
FEV1FVC-%Pred-Pre: 103 %
FEV6-%Change-Post: -4 %
FEV6-%Pred-Post: 94 %
FEV6-%Pred-Pre: 98 %
FEV6-Post: 3.03 L
FEV6-Pre: 3.17 L
FEV6FVC-%Pred-Post: 103 %
FEV6FVC-%Pred-Pre: 103 %
FVC-%Change-Post: -4 %
FVC-%Pred-Post: 91 %
FVC-%Pred-Pre: 95 %
FVC-Post: 3.03 L
FVC-Pre: 3.17 L
Post FEV1/FVC ratio: 86 %
Post FEV6/FVC ratio: 100 %
Pre FEV1/FVC ratio: 82 %
Pre FEV6/FVC Ratio: 100 %
RV % pred: 86 %
RV: 1.6 L
TLC % pred: 94 %
TLC: 4.62 L

## 2020-04-29 NOTE — Progress Notes (Signed)
PFT done today. 

## 2020-05-03 ENCOUNTER — Ambulatory Visit (INDEPENDENT_AMBULATORY_CARE_PROVIDER_SITE_OTHER): Payer: BC Managed Care – PPO | Admitting: Family Medicine

## 2020-05-12 ENCOUNTER — Other Ambulatory Visit (INDEPENDENT_AMBULATORY_CARE_PROVIDER_SITE_OTHER): Payer: Self-pay | Admitting: Family Medicine

## 2020-05-13 LAB — COMPREHENSIVE METABOLIC PANEL
ALT: 17 IU/L (ref 0–32)
AST: 21 IU/L (ref 0–40)
Albumin/Globulin Ratio: 1.7 (ref 1.2–2.2)
Albumin: 4.1 g/dL (ref 3.8–4.9)
Alkaline Phosphatase: 97 IU/L (ref 48–121)
BUN/Creatinine Ratio: 14 (ref 9–23)
BUN: 15 mg/dL (ref 6–24)
Bilirubin Total: 0.6 mg/dL (ref 0.0–1.2)
CO2: 26 mmol/L (ref 20–29)
Calcium: 9.5 mg/dL (ref 8.7–10.2)
Chloride: 104 mmol/L (ref 96–106)
Creatinine, Ser: 1.04 mg/dL — ABNORMAL HIGH (ref 0.57–1.00)
GFR calc Af Amer: 70 mL/min/{1.73_m2} (ref >59)
GFR calc non Af Amer: 61 mL/min/{1.73_m2} (ref >59)
Globulin, Total: 2.4 g/dL (ref 1.5–4.5)
Glucose: 73 mg/dL (ref 65–99)
Potassium: 4.6 mmol/L (ref 3.5–5.2)
Sodium: 142 mmol/L (ref 134–144)
Total Protein: 6.5 g/dL (ref 6.0–8.5)

## 2020-05-13 LAB — TSH: TSH: 1.08 u[IU]/mL (ref 0.450–4.500)

## 2020-05-13 LAB — LIPID PANEL WITH LDL/HDL RATIO
Cholesterol, Total: 169 mg/dL (ref 100–199)
HDL: 71 mg/dL (ref >39)
LDL Chol Calc (NIH): 82 mg/dL (ref 0–99)
LDL/HDL Ratio: 1.2 ratio (ref 0.0–3.2)
Triglycerides: 85 mg/dL (ref 0–149)
VLDL Cholesterol Cal: 16 mg/dL (ref 5–40)

## 2020-05-13 LAB — INSULIN, RANDOM: INSULIN: 6.7 u[IU]/mL (ref 2.6–24.9)

## 2020-05-13 LAB — VITAMIN D 25 HYDROXY (VIT D DEFICIENCY, FRACTURES): Vit D, 25-Hydroxy: 37.4 ng/mL (ref 30.0–100.0)

## 2020-05-13 LAB — T4, FREE: Free T4: 1.83 ng/dL — ABNORMAL HIGH (ref 0.82–1.77)

## 2020-05-13 LAB — T3: T3, Total: 111 ng/dL (ref 71–180)

## 2020-05-25 ENCOUNTER — Ambulatory Visit (INDEPENDENT_AMBULATORY_CARE_PROVIDER_SITE_OTHER): Payer: BC Managed Care – PPO | Admitting: Family Medicine

## 2020-05-25 ENCOUNTER — Encounter (INDEPENDENT_AMBULATORY_CARE_PROVIDER_SITE_OTHER): Payer: Self-pay | Admitting: Family Medicine

## 2020-05-25 ENCOUNTER — Other Ambulatory Visit: Payer: Self-pay

## 2020-05-25 VITALS — BP 131/85 | HR 72 | Temp 97.7°F | Ht 64.0 in | Wt 203.0 lb

## 2020-05-25 DIAGNOSIS — Z6834 Body mass index (BMI) 34.0-34.9, adult: Secondary | ICD-10-CM

## 2020-05-25 DIAGNOSIS — Z9189 Other specified personal risk factors, not elsewhere classified: Secondary | ICD-10-CM

## 2020-05-25 DIAGNOSIS — I2721 Secondary pulmonary arterial hypertension: Secondary | ICD-10-CM

## 2020-05-25 DIAGNOSIS — E559 Vitamin D deficiency, unspecified: Secondary | ICD-10-CM

## 2020-05-25 DIAGNOSIS — F3289 Other specified depressive episodes: Secondary | ICD-10-CM | POA: Diagnosis not present

## 2020-05-25 DIAGNOSIS — E669 Obesity, unspecified: Secondary | ICD-10-CM

## 2020-05-25 MED ORDER — VITAMIN D (ERGOCALCIFEROL) 1.25 MG (50000 UNIT) PO CAPS: 50000.0000 [IU] | ORAL_CAPSULE | ORAL | 1 refills | Status: DC

## 2020-05-25 NOTE — Progress Notes (Signed)
Chief Complaint:   OBESITY Rachael Camacho is here to discuss her progress with her obesity treatment plan along with follow-up of her obesity related diagnoses. Rachael Camacho is on the Category 2 Plan and states she is following her eating plan approximately 0% of the time. Rachael Camacho states she is more mindful of her steps.  Today's visit was #: 80 Starting weight: 207 lbs Starting date: 09/10/2017 Today's weight: 203 lbs Today's date: 05/25/2020 Total lbs lost to date: 4 Total lbs lost since last in-office visit: 3  Interim History: Rachael Camacho has not been following any plan or guidelines. Her primary care physician added on bupropion to Trinellix and she feels that has really helped reduce overall depression levels.  Subjective:   1. Vitamin D deficiency Rachael Camacho's vit D level on 05/12/2020 was 37.4. She is on prescription strength Vit D supplementation, and is tolerating it well. I discussed labs with the patient today.  2. Other depression with emotional eating Rachael Camacho's primary care physician added bupropion to Trintellix, and she reports reduction in depression. She denies suicidal ideas or homicidal ideas.  3. PAH (pulmonary artery hypertension) ([place]) Rachael Camacho's PAH is controlled with furosemide 20 mg q daily. Her CMP on 05/12/2020 was stable. I discussed labs with the patient today.  4. At risk for dehydration Rachael Camacho is at risk for dehydration due to daily furosemide to treat pulmonary artery hypertension.  Assessment/Plan:   1. Vitamin D deficiency Low Vitamin D level contributes to fatigue and are associated with obesity, breast, and colon cancer. We will refill prescription Vitamin D for 2 months. Cortni will follow-up for routine testing of Vitamin D, at least 2-3 times per year to avoid over-replacement. We will recheck labs in 3 months.  - Vitamin D, Ergocalciferol, (DRISDOL) 1.25 MG (50000 UNIT) CAPS capsule; Take 1 capsule (50,000 Units total) by mouth every 7 (seven) days.  Dispense: 4  capsule; Refill: 1  2. Other depression with emotional eating Behavior modification techniques were discussed today to help [name] deal with her emotional/non-hunger eating behaviors. [name] will continue her current anti-depressant regimen as directed. Orders and follow up as documented in patient record.   3. PAH (pulmonary artery hypertension) ([place]) [name] will continue to follow up with Pulmonology as directed, and will continue her diuretic as directed.  4. At risk for dehydration Rachael Camacho was given approximately 15 minutes dehydration prevention counseling today. Rachael Camacho is at risk for dehydration due to weight loss and current medication(s). She was encouraged to hydrate and monitor fluid status to avoid dehydration as well as weight loss plateaus.   5. Class 1 obesity with serious comorbidity and body mass index (BMI) of 34.0 to 34.9 in adult, unspecified obesity type Rachael Camacho is currently in the action stage of change. As such, her goal is to continue with weight loss efforts. She has agreed to the Category 2 Plan and keeping a food journal and adhering to recommended goals of 400-500 calories and 35+ grams of protein at supper daily.   Handout provided today: Additional Breakfast Options.  Exercise goals: As is.  Behavioral modification strategies: increasing lean protein intake, decreasing simple carbohydrates, no skipping meals, meal planning and cooking strategies and keeping a strict food journal.  Rachael Camacho has agreed to follow-up with our clinic in 6 weeks. She was informed of the importance of frequent follow-up visits to maximize her success with intensive lifestyle modifications for her multiple health conditions.   Objective:   Blood pressure 131/85, pulse 72, temperature 97.7 F (36.5 C), temperature  source Oral, height 5\' 4"  (1.626 m), weight 203 lb (92.1 kg), SpO2 91 %. Body mass index is 34.84 kg/m.  General: Cooperative, alert, well developed, in no acute  distress. HEENT: Conjunctivae and lids unremarkable. Cardiovascular: Regular rhythm.  Lungs: Normal work of breathing. Neurologic: No focal deficits.   Lab Results  Component Value Date   CREATININE 1.04 (H) 05/12/2020   BUN 15 05/12/2020   NA 142 05/12/2020   K 4.6 05/12/2020   CL 104 05/12/2020   CO2 26 05/12/2020   Lab Results  Component Value Date   ALT 17 05/12/2020   AST 21 05/12/2020   ALKPHOS 97 05/12/2020   BILITOT 0.6 05/12/2020   Lab Results  Component Value Date   HGBA1C 4.8 03/23/2020   HGBA1C 5.0 11/10/2019   HGBA1C 4.9 06/25/2018   HGBA1C 4.9 12/10/2017   HGBA1C 5.0 09/10/2017   Lab Results  Component Value Date   INSULIN 6.7 05/12/2020   INSULIN CANCELED 03/23/2020   INSULIN 9.9 11/10/2019   INSULIN 8.2 06/25/2018   INSULIN 4.4 12/10/2017   Lab Results  Component Value Date   TSH 1.080 05/12/2020   Lab Results  Component Value Date   CHOL 169 05/12/2020   HDL 71 05/12/2020   LDLCALC 82 05/12/2020   TRIG 85 05/12/2020   Lab Results  Component Value Date   WBC 9.2 03/23/2020   HGB 13.3 03/23/2020   HCT 41.1 03/23/2020   MCV 89 03/23/2020   PLT 264 03/23/2020   No results found for: IRON, TIBC, FERRITIN  Attestation Statements:   Reviewed by clinician on day of visit: allergies, medications, problem list, medical history, surgical history, family history, social history, and previous encounter notes.   I, Nykerria Macconnell, am acting as transcriptionist for Burt Knack, MD.  I have reviewed the above documentation for accuracy and completeness, and I agree with the above. -  Quillian Quince, MD

## 2020-06-03 ENCOUNTER — Telehealth: Payer: Self-pay | Admitting: Family Medicine

## 2020-06-03 NOTE — Telephone Encounter (Signed)
Pt is needing a refill on her baclofen (LIORESAL) 10 MG tablet but is running low so she is needing a refill sent in soon and would like to know [facility] and [facility] Please advise.

## 2020-06-06 MED ORDER — BACLOFEN 10 MG PO TABS: ORAL_TABLET | ORAL | 0 refills | Status: DC

## 2020-06-07 ENCOUNTER — Telehealth (HOSPITAL_COMMUNITY): Payer: Self-pay | Admitting: Pharmacist

## 2020-06-07 NOTE — Telephone Encounter (Signed)
Patient Advocate Encounter °  °Received notification from [facility] that prior authorization for Opsumit is required. °  °PA submitted via fax °Status is pending °  °Will continue to follow. ° °Angi Goodell, PharmD, BCPS, BCCP, CPP °Heart Failure Clinic Pharmacist °[phone] ° °

## 2020-06-08 ENCOUNTER — Other Ambulatory Visit: Payer: Self-pay

## 2020-06-08 ENCOUNTER — Encounter: Payer: Self-pay | Admitting: Pulmonary Disease

## 2020-06-08 ENCOUNTER — Ambulatory Visit (INDEPENDENT_AMBULATORY_CARE_PROVIDER_SITE_OTHER): Payer: BC Managed Care – PPO | Admitting: Pulmonary Disease

## 2020-06-08 VITALS — BP 122/78 | HR 74 | Temp 98.6°F | Ht 64.5 in | Wt 208.8 lb

## 2020-06-08 DIAGNOSIS — I2721 Secondary pulmonary arterial hypertension: Secondary | ICD-10-CM

## 2020-06-08 DIAGNOSIS — M341 CR(E)ST syndrome: Secondary | ICD-10-CM | POA: Diagnosis not present

## 2020-06-08 NOTE — Patient Instructions (Signed)
  Schedule echocardiogram to estimate right heart pressure. Schedule nocturnal oximetry on room air.  Please ask PCP to get BNP test with blood draw

## 2020-06-08 NOTE — Assessment & Plan Note (Signed)
NYHA class II -estimates slight worsening DLCO has dropped slightly 5% compared to 2018 Schedule echocardiogram to estimate right heart pressure. Schedule nocturnal oximetry on room air. Needs BNP   Based on above may need repeat right heart cath to estimate

## 2020-06-08 NOTE — Assessment & Plan Note (Signed)
Overall appears stable -no worsening of Raynaud's or dysphagia or skin or joint symptoms

## 2020-06-08 NOTE — Progress Notes (Signed)
   Subjective:    Patient ID: CELES DEDIC, female    DOB: [birth year], 54 y.o.   MRN: [id number]  HPI   54 yo never smoker for follow-up of pulmonary hypertension, nocturnal hypoxia  . PMH OSA - CPAP was dc'd 08/2015 after HST,CREST syndrome, trigeminal neuralgia, obesity s/p gastric sleeve 2016  She has received both Covid vaccines.  Dyspnea may be slightly worse, unusual exertion. Complains of depression -now on bupropion and Trintellix. Underwent C1/C2 fusion -she does not sleep well if she does not take her baclofen and gabapentin  We reviewed PFTs today  No new skin or joint symptoms, swallowing is normal.  Raynaud's is controlled and she has been working from home she can control the ambient temperature   Significant tests/ events reviewed  01/2015 saturation about 93%-on walking she desaturate to 84%  Home sleep study 02/2015>>AHI 10.6, SaO2 low 70%. She spent 329.7 min (84.7% of test time) with SaO2 <90%. Consistent with mild sleep apnea   Right heart cath >>02/2015 -right atrial pressure 10, pulmonary capillary wedge pressure 14 and PA pressure 50/19. PVR calculates to 2.7 WU (normal range). PA pressures are up due to high output and pulmonary vasodilators not indicated.   HST 08/2015 >>low AHI, desatn >stay on 2L O2  RHC 11/2015 PVR 2.8 Wu  PFT 11/2015 DLCO 58%  PFTs2/2018showed lung volumes preserved,DLCO stable at 60% PFTs 04/2020 lung volumes maintained, DLCO decreased from 13.7/60% to 10 0.70/53%  Repeat right heart cath 02/2017  showed PA pressure in the 60s, PVR increased to 5.3WU  HRCT 2016 showed mil centrilobular groundglass and dilated main pulmonary artery.     Review of Systems Patient denies significant cough, hemoptysis,  chest pain, palpitations, pedal edema, orthopnea, paroxysmal nocturnal dyspnea, lightheadedness, nausea, vomiting, abdominal or  leg pains      Objective:   Physical Exam   Gen. Pleasant, obese, in no  distress ENT - no lesions, no post nasal drip Neck: No JVD, no thyromegaly, no carotid bruits Lungs: no use of accessory muscles, no dullness to percussion, decreased without rales or rhonchi  Cardiovascular: Rhythm regular, heart sounds  normal, no murmurs or gallops, no peripheral edema Musculoskeletal: No deformities, no cyanosis or clubbing , no tremors        Assessment & Plan:

## 2020-06-23 ENCOUNTER — Encounter: Payer: Self-pay | Admitting: Pulmonary Disease

## 2020-06-29 ENCOUNTER — Ambulatory Visit (HOSPITAL_COMMUNITY): Payer: BC Managed Care – PPO | Attending: Cardiology

## 2020-06-29 ENCOUNTER — Other Ambulatory Visit: Payer: Self-pay

## 2020-06-29 DIAGNOSIS — I2721 Secondary pulmonary arterial hypertension: Secondary | ICD-10-CM

## 2020-06-29 NOTE — Telephone Encounter (Signed)
Advanced Heart Failure Patient Advocate Encounter  Prior Authorization for Opsumit has been approved.    Effective dates: 06/13/20 through 06/13/21  Karle Plumber, PharmD, BCPS, BCCP, CPP Heart Failure Clinic Pharmacist [phone]

## 2020-06-30 ENCOUNTER — Telehealth: Payer: Self-pay | Admitting: Pulmonary Disease

## 2020-06-30 DIAGNOSIS — G4734 Idiopathic sleep related nonobstructive alveolar hypoventilation: Secondary | ICD-10-CM

## 2020-06-30 NOTE — Telephone Encounter (Signed)
ONO/RA showed desaturation for 1.5 hours during sleep. Continue oxygen Echo shows stable pulmonary artery pressures

## 2020-07-01 NOTE — Telephone Encounter (Signed)
ATC pt, no answer. Left message for pt to call back.  

## 2020-07-07 ENCOUNTER — Ambulatory Visit (INDEPENDENT_AMBULATORY_CARE_PROVIDER_SITE_OTHER): Payer: BC Managed Care – PPO | Admitting: Family Medicine

## 2020-07-07 ENCOUNTER — Encounter (INDEPENDENT_AMBULATORY_CARE_PROVIDER_SITE_OTHER): Payer: Self-pay | Admitting: Family Medicine

## 2020-07-07 ENCOUNTER — Other Ambulatory Visit: Payer: Self-pay

## 2020-07-07 VITALS — BP 103/72 | HR 77 | Temp 97.9°F | Ht 64.0 in | Wt 203.0 lb

## 2020-07-07 DIAGNOSIS — Z9189 Other specified personal risk factors, not elsewhere classified: Secondary | ICD-10-CM

## 2020-07-07 DIAGNOSIS — E559 Vitamin D deficiency, unspecified: Secondary | ICD-10-CM

## 2020-07-07 DIAGNOSIS — Z6835 Body mass index (BMI) 35.0-35.9, adult: Secondary | ICD-10-CM | POA: Diagnosis not present

## 2020-07-07 MED ORDER — VITAMIN D (ERGOCALCIFEROL) 1.25 MG (50000 UNIT) PO CAPS: 50000.0000 [IU] | ORAL_CAPSULE | ORAL | 0 refills | Status: DC

## 2020-07-07 NOTE — Telephone Encounter (Signed)
Called pt and advised message from the provider. Pt understood and verbalized understanding. RA how much oxygen does she need at night? SHe is not currently on oxygen so we must order it.

## 2020-07-07 NOTE — Telephone Encounter (Signed)
Order sent to [facility] for this.

## 2020-07-07 NOTE — Telephone Encounter (Signed)
2 L during sleep

## 2020-07-08 LAB — VITAMIN D 25 HYDROXY (VIT D DEFICIENCY, FRACTURES): Vit D, 25-Hydroxy: 41.2 ng/mL (ref 30.0–100.0)

## 2020-07-11 NOTE — Progress Notes (Signed)
Chief Complaint:   OBESITY Rachael Camacho is here to discuss her progress with her obesity treatment plan along with follow-up of her obesity related diagnoses. Rachael Camacho is on the Category 2 Plan and keeping a food journal and adhering to recommended goals of 400-500 calories and 35+ grams of protein at supper daily and states she is following her eating plan approximately 0% of the time. Theo states she is doing 0 minutes 0 times per week.  Today's visit was #: 28 Starting weight: 207 lbs Starting date: 09/10/2017 Today's weight: 203 lbs Today's date: 07/07/2020 Total lbs lost to date: 4 Total lbs lost since last in-office visit: 0  Interim History: Rachael Camacho has done well with maintaining her weight. 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  Subjective:   1. Vitamin D deficiency Rachael Camacho is stable on Vit D, but her level is not yet at goal.  2. At risk for osteoporosis Rachael Camacho is at higher risk of osteopenia and osteoporosis due to Vitamin D deficiency.   Assessment/Plan:   1. Vitamin D deficiency Low Vitamin D level contributes to fatigue and are associated with obesity, breast, and colon cancer. We will check labs today, and we will refill prescription Vitamin D for 90 days with no refill. Rachael Camacho will follow-up for routine testing of Vitamin D, at least 2-3 times per year to avoid over-replacement.  - VITAMIN D 25 Hydroxy (Vit-D Deficiency, Fractures)  - Vitamin D, Ergocalciferol, (DRISDOL) 1.25 MG (50000 UNIT) CAPS capsule; Take 1 capsule (50,000 Units total) by mouth every 7 (seven) days.  Dispense: 12 capsule; Refill: 0  2. At risk for osteoporosis Rachael Camacho was given approximately 15 minutes of osteoporosis prevention counseling today. Rachael Camacho is at risk for osteopenia and osteoporosis due to her Vitamin D deficiency. She was encouraged to take her Vitamin D and  follow her higher calcium diet and increase strengthening exercise to help strengthen her bones and decrease her risk of osteopenia and osteoporosis.  Repetitive spaced learning was employed today to elicit superior memory formation and behavioral change.  3. Class 2 severe obesity with serious comorbidity and body mass index (BMI) of 35.0 to 35.9 in adult, unspecified obesity type ([facility]) Rachael Camacho is currently in the action stage of change. As such, her goal is to maintain weight for now. She has agreed to the Category 2 Plan.   Rachael Camacho's goal is to maintain her weight during our 3 month "break". She is ok to schedule a follow up earlier if needed.  Behavioral modification strategies: increasing lean protein intake.  Rachael Camacho has agreed to follow-up with our clinic in 12 weeks. She was informed of the importance of frequent follow-up visits to maximize her success with intensive lifestyle modifications for her multiple health conditions.   Rachael Camacho was informed we would discuss her lab results at her next visit unless there is a critical issue that needs to be addressed sooner. Rachael Camacho agreed to keep her next visit at the agreed upon time to discuss these results.  Objective:   Blood pressure 103/72, pulse 77, temperature 97.9 F (36.6 C), temperature source Oral, height 5\' 4"  (1.626 m), weight 203 lb (92.1 kg), SpO2 95 %. Body mass index is 34.84 kg/m.  General: Cooperative, alert, well developed, in no acute distress. HEENT: Conjunctivae and lids unremarkable. Cardiovascular: Regular rhythm.  Lungs: Normal work of breathing.  Neurologic: No focal deficits.   Lab Results  Component Value Date   CREATININE 1.04 (H) 05/12/2020   BUN 15 05/12/2020   NA 142 05/12/2020   K 4.6 05/12/2020   CL 104 05/12/2020   CO2 26 05/12/2020   Lab Results  Component Value Date   ALT 17 05/12/2020   AST 21 05/12/2020   ALKPHOS 97 05/12/2020   BILITOT 0.6 05/12/2020   Lab Results  Component Value Date     HGBA1C 4.8 03/23/2020   HGBA1C 5.0 11/10/2019   HGBA1C 4.9 06/25/2018   HGBA1C 4.9 12/10/2017   HGBA1C 5.0 09/10/2017   Lab Results  Component Value Date   INSULIN 6.7 05/12/2020   INSULIN CANCELED 03/23/2020   INSULIN 9.9 11/10/2019   INSULIN 8.2 06/25/2018   INSULIN 4.4 12/10/2017   Lab Results  Component Value Date   TSH 1.080 05/12/2020   Lab Results  Component Value Date   CHOL 169 05/12/2020   HDL 71 05/12/2020   LDLCALC 82 05/12/2020   TRIG 85 05/12/2020   Lab Results  Component Value Date   WBC 9.2 03/23/2020   HGB 13.3 03/23/2020   HCT 41.1 03/23/2020   MCV 89 03/23/2020   PLT 264 03/23/2020   No results found for: IRON, TIBC, FERRITIN  Attestation Statements:   Reviewed by clinician on day of visit: allergies, medications, problem list, medical history, surgical history, family history, social history, and previous encounter notes.   I, Amyrie Illingworth, am acting as transcriptionist for Quillian Quince, MD.  I have reviewed the above documentation for accuracy and completeness, and I agree with the above. -  Quillian Quince, MD

## 2020-07-13 ENCOUNTER — Other Ambulatory Visit (HOSPITAL_COMMUNITY): Payer: Self-pay | Admitting: Internal Medicine

## 2020-07-14 ENCOUNTER — Telehealth (HOSPITAL_COMMUNITY): Payer: Self-pay | Admitting: Pharmacist

## 2020-07-14 MED ORDER — OPSUMIT 10 MG PO TABS: 10.0000 mg | ORAL_TABLET | Freq: Every day | ORAL | 11 refills | Status: DC

## 2020-07-14 NOTE — Telephone Encounter (Signed)
Ops[facility]it prescription refills [facility]. Previous script sent did not have any refills.   Karle Plumber, PharmD, BCPS, BCCP, CPP Heart Failure Clinic Pharmacist [phone]

## 2020-08-01 ENCOUNTER — Other Ambulatory Visit: Payer: Self-pay | Admitting: Endocrinology

## 2020-08-01 DIAGNOSIS — E042 Nontoxic multinodular goiter: Secondary | ICD-10-CM

## 2020-08-05 ENCOUNTER — Other Ambulatory Visit: Payer: BC Managed Care – PPO

## 2020-08-11 ENCOUNTER — Ambulatory Visit
Admission: RE | Admit: 2020-08-11 | Discharge: 2020-08-11 | Disposition: A | Discharge status: Home / Self Care | Payer: BC Managed Care – PPO | Source: Ambulatory Visit | Attending: Endocrinology | Admitting: Endocrinology

## 2020-08-11 DIAGNOSIS — E042 Nontoxic multinodular goiter: Secondary | ICD-10-CM

## 2020-08-25 ENCOUNTER — Encounter: Payer: Self-pay | Admitting: Adult Health

## 2020-08-25 ENCOUNTER — Ambulatory Visit: Payer: BC Managed Care – PPO | Admitting: Adult Health

## 2020-08-25 ENCOUNTER — Other Ambulatory Visit: Payer: Self-pay

## 2020-08-25 VITALS — BP 123/81 | HR 66 | Ht 64.0 in | Wt 211.2 lb

## 2020-08-25 DIAGNOSIS — G500 Trigeminal neuralgia: Secondary | ICD-10-CM

## 2020-08-25 MED ORDER — GABAPENTIN 100 MG PO CAPS: 100.0000 mg | ORAL_CAPSULE | Freq: Every day | ORAL | 3 refills | Status: DC

## 2020-08-25 MED ORDER — BACLOFEN 10 MG PO TABS
10.0000 mg | ORAL_TABLET | Freq: Every day | ORAL | 3 refills | Status: DC
Start: 2020-08-25 — End: 2020-10-20

## 2020-08-25 MED ORDER — BACLOFEN 10 MG PO TABS: 10.0000 mg | ORAL_TABLET | Freq: Every day | ORAL | 0 refills | Status: DC

## 2020-08-25 NOTE — Progress Notes (Signed)
PATIENT: Rachael Camacho DOB: 1966-10-07  REASON FOR VISIT: follow up HISTORY FROM: patient  HISTORY OF PRESENT ILLNESS: Today 08/25/20:  Rachael Camacho is a 54 year old female with a history of trigeminal neuralgia.  She reports that as long as she takes baclofen and gabapentin she has been doing well.  Reports that she has been out of her baclofen for several months.  She reports that discomfort in the right side of the face is slowly coming back.  She reports that on occasion if she yawns she feels pain.  She states there are times she feels that she is getting an earache or if she bites down on food she will have an electric-like pain.  She denies any new symptoms.  Returns today for an evaluation.  HISTORY 06/09/19 Rachael Camacho is a 54 y.o. female here today for follow up of trigeminal neuralgia.  She reports that she is doing well.  She does take gabapentin 100 mg as well as baclofen 10 mg every night.  She can tell a significant difference in her pain levels if she misses her medications.  She does not feel that she can tolerate an increased dose due to grogginess.  She is tolerating medications well without obvious adverse effects.  She does continue to have mild numbness and tingling in the right first, second, third and fourth digit.  She noticed this post neck surgery.  Nerve conduction study was normal.  She feels that it is manageable at this time.   REVIEW OF SYSTEMS: Out of a complete 14 system review of symptoms, the patient complains only of the following symptoms, and all other reviewed systems are negative.  See HPI  ALLERGIES: Allergies  Allergen Reactions  . Amoxicillin Other (See Comments)    Immediate yeast infection  Has patient had a PCN reaction causing immediate rash, facial/tongue/throat swelling, SOB or lightheadedness with hypotension: No Has patient had a PCN reaction causing severe rash involving mucus membranes or skin necrosis: No Has patient had a PCN  reaction that required hospitalization: No Has patient had a PCN reaction occurring within the last 10 years: No If all of the above answers are "NO", then may proceed with Cephalosporin use.   [name] Percocet [Oxycodone-Acetaminophen] Itching    Tolerates acetaminophen alone  . [name] [Nitrofurantoin Monohyd Macro] Rash and Hives  . [name] Hives and Rash    Reported chicken pox-like rash, not hives  . Sulfa Antibiotics Rash and Hives    HOME MEDICATIONS: Outpatient Medications Prior to Visit  Medication Sig Dispense Refill  . buPROPion (WELLBUTRIN XL) 150 MG 24 hr tablet Take 150 mg by mouth daily.    . diclofenac sodium (VOLTAREN) 1 % GEL Apply 2-4 g topically 4 (four) times daily as needed (for knee pain.).    [name] fluticasone (FLONASE) 50 MCG/ACT nasal spray Place 1 spray into both nostrils as needed for allergies or rhinitis. Aller-Flo    . furosemide (LASIX) 20 MG tablet TAKE 1 TABLET (20 MG TOTAL) BY MOUTH DAILY. 90 tablet 3  . gabapentin (NEURONTIN) 100 MG capsule TAKE 1 CAPSULE BY MOUTH THREE TIMES A DAY 270 capsule 1  . levothyroxine (SYNTHROID) 50 MCG tablet Take 50 mcg by mouth daily before breakfast.    . macitentan (OPSUMIT) 10 MG tablet Take 1 tablet (10 mg total) by mouth daily. 30 tablet 11  . Probiotic Product (PROBIOTIC PO) Take 1 capsule by mouth daily.    . TRINTELLIX 5 MG TABS tablet  Take 5 mg by mouth daily.    . Vitamin D, Ergocalciferol, (DRISDOL) 1.25 MG (50000 UNIT) CAPS capsule Take 1 capsule (50,000 Units total) by mouth every 7 (seven) days. 12 capsule 0  . Biotin 5000 MCG CAPS Take 5,000 mcg by mouth daily.    . Cyanocobalamin (VITAMIN B-12) 5000 MCG SUBL Take 5,000 mcg by mouth daily.    . fexofenadine-pseudoephedrine (ALLEGRA-D 24) 180-240 MG 24 hr tablet Take 1 tablet by mouth daily.    . Multiple Vitamin (MULTIVITAMIN WITH MINERALS) TABS tablet Take 1 tablet by mouth daily.     No facility-administered medications prior to visit.    PAST MEDICAL  HISTORY: Past Medical History:  Diagnosis Date  . Anemia    as child in first grade  . Anxiety   . Arthritis   . Back pain   . Constipation   . CREST syndrome (HCC)   . Dyspnea   . Family history of adverse reaction to anesthesia    mom has had problems in past - N/V post op on one occasion; difficult time waking up on another occasion  . Gallbladder problem   . GERD (gastroesophageal reflux disease)   . Gestational diabetes mellitus in childbirth, diet controlled 1987  . IBS (irritable bowel syndrome)   . Joint pain   . Leg edema   . Menopause   . Osteoarthritis (arthritis due to wear and tear of joints)   . PAH (pulmonary artery hypertension) (HCC)   . Pulmonary fibrosis (HCC)   . Raynaud disease    hands-toes  . Scleroderma (HCC)   . Shortness of breath dyspnea    pulmonary fibrosis-scleraderma  . Sleep apnea   . Swallowing difficulty   . Thyroid cyst   . Vitamin D deficiency   . Wears glasses     PAST SURGICAL HISTORY: Past Surgical History:  Procedure Laterality Date  . CARDIAC CATHETERIZATION N/A 11/25/2015   Procedure: Right Heart Cath;  Surgeon: Dolores Pattyaniel R Bensimhon, MD;  [facility];  Service: Cardiovascular;  Laterality: N/A;  . CESAREAN SECTION  87,95  . CHOLECYSTECTOMY  1996   lap choli  . COLONOSCOPY    . ESOPHAGOGASTRODUODENOSCOPY (EGD) WITH PROPOFOL N/A 02/12/2017   Procedure: ESOPHAGOGASTRODUODENOSCOPY (EGD) WITH PROPOFOL;  Surgeon: Vida RiggerMarc Magod, MD;  Location: [facility];  Service: Endoscopy;  Laterality: N/A;  . HIATAL HERNIA REPAIR  07/26/2015   Procedure: LAPAROSCOPIC REPAIR OF HIATAL HERNIA;  Surgeon: Luretha MurphyMatthew Doan, MD;  Location: [facility]S;  Service: General;;  . LAPAROSCOPIC GASTRIC SLEEVE RESECTION N/A 07/26/2015   Procedure: LAPAROSCOPIC GASTRIC SLEEVE RESECTION;  Surgeon: Luretha MurphyMatthew Helder, MD;  Location: [facility];  Service: General;  Laterality: N/A;  . LEFT AND RIGHT HEART CATHETERIZATION WITH CORONARY ANGIOGRAM N/A 03/14/2015    Procedure: LEFT AND RIGHT HEART CATHETERIZATION WITH CORONARY ANGIOGRAM;  Surgeon: Peter M SwazilandJordan, MD;  [facility];  Service: Cardiovascular;  Laterality: N/A;  . POSTERIOR CERVICAL FUSION/FORAMINOTOMY N/A 01/16/2018   Procedure: Cervical one-two Posterior instrumentation and fusion;  Surgeon: Tressie StalkerJenkins, Jeffrey, MD;  [facility];  Service: Neurosurgery;  Laterality: N/A;  . RADIAL HEAD ARTHROPLASTY Right 07/28/2014   Procedure: RIGHT RADIAL HEAD REPLACEMENT;  Surgeon: Dairl PonderMatthew Weingold, MD;  Location: [facility];  Service: Orthopedics;  Laterality: Right;  . RIGHT HEART CATH N/A 03/18/2017   Procedure: Right Heart Cath;  Surgeon: Dolores Pattyaniel R Bensimhon, MD;  [facility];  Service: Cardiovascular;  Laterality: N/A;  . SAVORY DILATION N/A 02/12/2017  Procedure: SAVORY DILATION;  Surgeon: Vida Rigger, MD;  Location: [facility];  Service: Endoscopy;  Laterality: N/A;  . TUBAL LIGATION    . UPPER GI ENDOSCOPY  07/26/2015   Procedure: UPPER GI ENDOSCOPY;  Surgeon: Luretha Murphy, MD;  Location: [facility];  Service: General;;  . VAGINAL HYSTERECTOMY  2010   LAVH    FAMILY HISTORY: Family History  Problem Relation Age of Onset  . Heart failure Mother        Stent put in 2014 for blockage  . COPD Mother   . Hypertension Mother   . Hyperlipidemia Mother   . Heart disease Mother   . Thyroid disease Mother   . Cancer Mother   . Depression Mother   . Anxiety disorder Mother   . Heart disease Father   . Heart disease Son        Repair ASD  . Neuropathy Neg Hx     SOCIAL HISTORY: Social History   Socioeconomic History  . Marital status: Married    Spouse name: Letisia Schwalb  . Number of children: 2  . Years of education: Not on file  . Highest education level: Not on file  Occupational History  . Occupation: Facilities manager    Comment: Office Work  Tobacco Use  . Smoking status: Never Smoker  . Smokeless tobacco: Never Used    Vaping Use  . Vaping Use: Never used  Substance and Sexual Activity  . Alcohol use: Yes    Alcohol/week: 0.0 standard drinks    Comment: seldom  . Drug use: No  . Sexual activity: Not on file  Other Topics Concern  . Not on file  Social History Narrative   Lives at home with her husband   Right handed   Social Determinants of Health   Financial Resource Strain:   . Difficulty of Paying Living Expenses: Not on file  Food Insecurity:   . Worried About Programme researcher, broadcasting/film/video in the Last Year: Not on file  . Ran Out of Food in the Last Year: Not on file  Transportation Needs:   . Lack of Transportation (Medical): Not on file  . Lack of Transportation (Non-Medical): Not on file  Physical Activity:   . Days of Exercise per Week: Not on file  . Minutes of Exercise per Session: Not on file  Stress:   . Feeling of Stress : Not on file  Social Connections:   . Frequency of Communication with Friends and Family: Not on file  . Frequency of Social Gatherings with Friends and Family: Not on file  . Attends Religious Services: Not on file  . Active Member of Clubs or Organizations: Not on file  . Attends Banker Meetings: Not on file  . Marital Status: Not on file  Intimate Partner Violence:   . Fear of Current or Ex-Partner: Not on file  . Emotionally Abused: Not on file  . Physically Abused: Not on file  . Sexually Abused: Not on file      PHYSICAL EXAM  Vitals:   08/25/20 1025  BP: 123/81  Pulse: 66  Weight: 211 lb 3.2 oz (95.8 kg)  Height: 5\' 4"  (1.626 m)   Body mass index is 36.25 kg/m.  Generalized: Well developed, in no acute distress   Neurological examination  Mentation: Alert oriented to time, place, history taking. Follows all commands speech and language fluent Cranial nerve II-XII: Pupils were equal round reactive to light. Extraocular movements were  full, visual field were full on confrontational test. . Head turning and shoulder shrug  were  normal and symmetric. Motor: The motor testing reveals 5 over 5 strength of all 4 extremities. Good symmetric motor tone is noted throughout.  Sensory: Sensory testing is intact to soft touch on all 4 extremities. No evidence of extinction is noted.  Coordination: Cerebellar testing reveals good finger-nose-finger and heel-to-shin bilaterally.  Gait and station: Gait is normal.    DIAGNOSTIC DATA (LABS, IMAGING, TESTING) - I reviewed patient records, labs, notes, testing and imaging myself where available.  Lab Results  Component Value Date   WBC 9.2 03/23/2020   HGB 13.3 03/23/2020   HCT 41.1 03/23/2020   MCV 89 03/23/2020   PLT 264 03/23/2020      Component Value Date/Time   NA 142 05/12/2020 1221   K 4.6 05/12/2020 1221   CL 104 05/12/2020 1221   CO2 26 05/12/2020 1221   GLUCOSE 73 05/12/2020 1221   GLUCOSE 86 01/13/2018 1000   BUN 15 05/12/2020 1221   CREATININE 1.04 (H) 05/12/2020 1221   CREATININE 0.75 03/08/2015 1501   CALCIUM 9.5 05/12/2020 1221   PROT 6.5 05/12/2020 1221   ALBUMIN 4.1 05/12/2020 1221   AST 21 05/12/2020 1221   ALT 17 05/12/2020 1221   ALKPHOS 97 05/12/2020 1221   BILITOT 0.6 05/12/2020 1221   GFRNONAA 61 05/12/2020 1221   GFRNONAA >89 03/08/2015 1501   GFRAA 70 05/12/2020 1221   GFRAA >89 03/08/2015 1501   Lab Results  Component Value Date   CHOL 169 05/12/2020   HDL 71 05/12/2020   LDLCALC 82 05/12/2020   TRIG 85 05/12/2020   Lab Results  Component Value Date   HGBA1C 4.8 03/23/2020   Lab Results  Component Value Date   VITAMINB12 433 11/10/2019   Lab Results  Component Value Date   TSH 1.080 05/12/2020      ASSESSMENT AND PLAN 54 y.o. year old female  has a past medical history of Anemia, Anxiety, Arthritis, Back pain, Constipation, CREST syndrome (HCC), Dyspnea, Family history of adverse reaction to anesthesia, Gallbladder problem, GERD (gastroesophageal reflux disease), Gestational diabetes mellitus in childbirth, diet  controlled (1987), IBS (irritable bowel syndrome), Joint pain, Leg edema, Menopause, Osteoarthritis (arthritis due to wear and tear of joints), PAH (pulmonary artery hypertension) (HCC), Pulmonary fibrosis (HCC), Raynaud disease, Scleroderma (HCC), Shortness of breath dyspnea, Sleep apnea, Swallowing difficulty, Thyroid cyst, Vitamin D deficiency, and Wears glasses. here with:  Trigeminal neuralgia   Continue gabapentin and baclofen  Advised if symptoms worsen or she develops new symptoms she should let us know  Follow-up in 6 months or sooner if needed   I spent 25 minutes of face-to-face and non-face-to-face time with patient.  This included previsit chart review, lab review, study review, order entry, electronic health record documentation, patient education.  Butch Penny, MSN, NP-C 08/25/2020, 10:34 AM [facility] [address], [address] [place], [place] [address] [phone]

## 2020-08-25 NOTE — Patient Instructions (Signed)
Your Plan:  Continue baclofen and gabapentin If your symptoms worsen or you develop new symptoms please let us know.   Thank you for coming to see [place] at [facility]. I hope we have been able to provide you high quality care today.  You may receive a patient satisfaction survey over the next few weeks. We would appreciate your feedback and comments so that we may continue to improve ourselves and the health of our patients.

## 2020-08-25 NOTE — Progress Notes (Signed)
I have read the note, and I agree with the clinical assessment and plan.  Raahim Shartzer K Kyndra Condron   

## 2020-09-22 ENCOUNTER — Other Ambulatory Visit: Payer: Self-pay

## 2020-09-22 ENCOUNTER — Ambulatory Visit (HOSPITAL_COMMUNITY)
Admission: RE | Admit: 2020-09-22 | Discharge: 2020-09-22 | Disposition: A | Discharge status: Home / Self Care | Payer: BC Managed Care – PPO | Source: Ambulatory Visit | Attending: Internal Medicine | Admitting: Internal Medicine

## 2020-09-22 ENCOUNTER — Encounter (HOSPITAL_COMMUNITY): Payer: Self-pay | Admitting: Internal Medicine

## 2020-09-22 ENCOUNTER — Other Ambulatory Visit (HOSPITAL_COMMUNITY): Payer: Self-pay | Admitting: *Deleted

## 2020-09-22 VITALS — BP 130/88 | HR 74 | Ht 64.0 in | Wt 208.6 lb

## 2020-09-22 DIAGNOSIS — G4733 Obstructive sleep apnea (adult) (pediatric): Secondary | ICD-10-CM | POA: Diagnosis not present

## 2020-09-22 DIAGNOSIS — Z8249 Family history of ischemic heart disease and other diseases of the circulatory system: Secondary | ICD-10-CM | POA: Insufficient documentation

## 2020-09-22 DIAGNOSIS — Z9884 Bariatric surgery status: Secondary | ICD-10-CM | POA: Insufficient documentation

## 2020-09-22 DIAGNOSIS — M349 Systemic sclerosis, unspecified: Secondary | ICD-10-CM | POA: Insufficient documentation

## 2020-09-22 DIAGNOSIS — I272 Pulmonary hypertension, unspecified: Secondary | ICD-10-CM | POA: Diagnosis not present

## 2020-09-22 DIAGNOSIS — I2721 Secondary pulmonary arterial hypertension: Secondary | ICD-10-CM | POA: Insufficient documentation

## 2020-09-22 DIAGNOSIS — F419 Anxiety disorder, unspecified: Secondary | ICD-10-CM | POA: Insufficient documentation

## 2020-09-22 DIAGNOSIS — K219 Gastro-esophageal reflux disease without esophagitis: Secondary | ICD-10-CM | POA: Diagnosis not present

## 2020-09-22 DIAGNOSIS — I7300 Raynaud's syndrome without gangrene: Secondary | ICD-10-CM | POA: Insufficient documentation

## 2020-09-22 DIAGNOSIS — J9610 Chronic respiratory failure, unspecified whether with hypoxia or hypercapnia: Secondary | ICD-10-CM | POA: Diagnosis not present

## 2020-09-22 DIAGNOSIS — Z79899 Other long term (current) drug therapy: Secondary | ICD-10-CM | POA: Insufficient documentation

## 2020-09-22 DIAGNOSIS — I5032 Chronic diastolic (congestive) heart failure: Secondary | ICD-10-CM | POA: Diagnosis not present

## 2020-09-22 DIAGNOSIS — J8410 Pulmonary fibrosis, unspecified: Secondary | ICD-10-CM | POA: Diagnosis not present

## 2020-09-22 DIAGNOSIS — K589 Irritable bowel syndrome without diarrhea: Secondary | ICD-10-CM | POA: Diagnosis not present

## 2020-09-22 DIAGNOSIS — M1990 Unspecified osteoarthritis, unspecified site: Secondary | ICD-10-CM | POA: Diagnosis not present

## 2020-09-22 DIAGNOSIS — R0683 Snoring: Secondary | ICD-10-CM | POA: Diagnosis not present

## 2020-09-22 DIAGNOSIS — E669 Obesity, unspecified: Secondary | ICD-10-CM | POA: Diagnosis not present

## 2020-09-22 LAB — CBC
HCT: 41.9 % (ref 36.0–46.0)
Hemoglobin: 13.2 g/dL (ref 12.0–15.0)
MCH: 29.5 pg (ref 26.0–34.0)
MCHC: 31.5 g/dL (ref 30.0–36.0)
MCV: 93.5 fL (ref 80.0–100.0)
Platelets: 272 10*3/uL (ref 150–400)
RBC: 4.48 MIL/uL (ref 3.87–5.11)
RDW: 13.1 % (ref 11.5–15.5)
WBC: 7 10*3/uL (ref 4.0–10.5)
nRBC: 0 % (ref 0.0–0.2)

## 2020-09-22 LAB — BASIC METABOLIC PANEL
Anion gap: 7 (ref 5–15)
BUN: 15 mg/dL (ref 6–20)
CO2: 28 mmol/L (ref 22–32)
Calcium: 9.7 mg/dL (ref 8.9–10.3)
Chloride: 106 mmol/L (ref 98–111)
Creatinine, Ser: 0.97 mg/dL (ref 0.44–1.00)
GFR calc Af Amer: >60 mL/min (ref >60)
GFR calc non Af Amer: >60 mL/min (ref >60)
Glucose, Bld: 94 mg/dL (ref 70–99)
Potassium: 4.7 mmol/L (ref 3.5–5.1)
Sodium: 141 mmol/L (ref 135–145)

## 2020-09-22 NOTE — Patient Instructions (Signed)
Heart Catheterization on Tue 10/5, see instructions below  Home Sleep Study is needed, once approved by your insurance company we will call you to schedule a time you can pick up the device  Your physician recommends that you schedule a follow-up appointment in: 4-6 months  If you have any questions or concerns before your next appointment please send [place] a message through [place] or call our office at [phone].    TO LEAVE A MESSAGE FOR THE NURSE SELECT OPTION 2, PLEASE LEAVE A MESSAGE INCLUDING: . YOUR NAME . DATE OF BIRTH . CALL BACK NUMBER . REASON FOR CALL**this is important as we prioritize the call backs  YOU WILL RECEIVE A CALL BACK THE SAME DAY AS LONG AS YOU CALL BEFORE 4:00 PM      CATHETERIZATION INSTRUCTIONS:  You are scheduled for a Cardiac Catheterization on Tuesday, October 5 with Dr. Arvilla Meres.  1. Please arrive at the [facility] (Main Entrance A) at [facility]: [address] [place], [place] [address] at 11:30 AM (This time is two hours before your procedure to ensure your preparation). Free valet parking service is available.   Special note: Every effort is made to have your procedure done on time. Please understand that emergencies sometimes delay scheduled procedures.  2. Diet: Do not eat solid foods after midnight.  The patient may have clear liquids until 5am upon the day of the procedure.  3. COVID TEST: Saturday 09/24/20 AT 9:25 AM, this is a drive-thru service located at:  [employer], [place]  4. Medication instructions in preparation for your procedure:   Tuesday 10/5 AM DO NOT TAKE: Furosemide   On the morning of your procedure, take any morning medicines NOT listed above.  You may use sips of water.  5. Plan for one night stay--bring personal belongings. 6. Bring a current list of your medications and current insurance cards. 7. You MUST have a responsible person to drive you home. 8. Someone MUST be with you the  first 24 hours after you arrive home or your discharge will be delayed. 9. Please wear clothes that are easy to get on and off and wear slip-on shoes.  Thank you for allowing [place] to care for you!   -- [facility]

## 2020-09-22 NOTE — Progress Notes (Signed)
Advanced Heart Failure Clinic Note   Patient ID: Rachael Camacho, female   DOB: [birth year], 54 y.o.   MRN: [id number] PCP: Primary Cardiologist: Dr Jens Som.  Primary HF: Dr Gala Romney Pulmonary: Dr Vassie Loll.  Rheumatologist: Dr. Kathi Ludwig [facility]   HPI: Rachael Camacho is a 54 year old with a history of  OSA, chornic diastolic HF, obesity, scleroderma, and Raynauds disease referred to HF clinic by Dr Vassie Loll.   Echo 7/21 EF 60-65% RV normal. TR jet inadequate to assess PA pressures. Personally reviewed  Started on macitenan 10 mg daily.   She presents today for PAH follow up. We have not seen her since 2019. Over past few months has had increased exertional dyspnea and more fatigue. Excessive daytime fatigue. Falls asleep during the day. Taking 5hr energy drinks. Now fe Saw Dr. Vassie Loll and had overnight oximetry. O2 started. Was on CPAP prior to weight loss surgery but off since 2016. Mild lower extremity edema. No recent flares of CTD.   PFTs 5/21 FEV1 2.59 (99%) FVC 3.17 (95%) DLCO 53%  ONOX: 7/21 1hr 28 min with sats < 88% (lowest 76%)   Studies/Tests   01/2015- 93% at rest but non walking she desaturate to 84%   Echo 11/12/14 showed grade 2 diastolic dysfunction, with normal systolic function Echo  01/29/17  EF 60% normal RV no PAH  CT angio 11/2014 was negative for pulmonary embolism, but showed mosaic pattern suggestive of early interstitial edema >> no response to Lasix CT angio 12/16: No ILD  VQ 7/16: No PE  Home sleep study 02/22/15.  Consistent with mild sleep apnea Desaturation noted.   RHC 02/2017: RA = 7 RV = 62/11 PA = 61/23 (39) PCW = 8 Fick cardiac output/index = 5.8/3.0 PVR = 5.3 WU Ao sat = 98% PA sat = 74%, 73% Mild to moderate PAH in setting of scleroderma.  Plan/Discussion: Start Macitentan 10mg  daily.  PFTs 03/09/15 no obstruction, mild restriction TLC 78%, DLCO 52% predicted  PFTs  11/16 FEV1 2.75L FVC   3.17L DLCO 52%  PFTs  01/2017: FEV1: 2.83 FVC: 3.42 DLCO: 60%  RHC 12/16 RA = 7 RV = 42/2/8 PA = 44/16 (28) PCW = 9 Fick cardiac output/index = 6.9/3.6 PVR = 2.8 WU Ao sat = 95% PA sat = 73% 76%  RHC [facility] 03/14/2015  RA 13/12 mean 10 mm Hg RV 53/11 mm Hg PA 50/19 mean 33 mm Hg  PCWP 18/16 mean 14 mm Hg LV 169/13/22 mm Hg  AO 165/92 mean 119 mm Hg Oxygen saturations: PA 73% AO 95% Cardiac Output (Fick) 6.9 L/min  Cardiac Index (Fick) 3.2 L/min/meter squared PVR = 2.8 WU No significant CAD     Review of systems complete and found to be negative unless listed in HPI.   SH:  Social History   Socioeconomic History  . Marital status: Married    Spouse name: Rachael Camacho  . Number of children: 2  . Years of education: Not on file  . Highest education level: Not on file  Occupational History  . Occupation: Rachael Camacho    Comment: Office Work  Tobacco Use  . Smoking status: Never Smoker  . Smokeless tobacco: Never Used  Vaping Use  . Vaping Use: Never used  Substance and Sexual Activity  . Alcohol use: Yes    Alcohol/week: 0.0 standard drinks    Comment: seldom  . Drug use: No  . Sexual activity: Not on file  Other Topics Concern  .  Not on file  Social History Narrative   Lives at home with her husband   Right handed   Social Determinants of Health   Financial Resource Strain:   . Difficulty of Paying Living Expenses: Not on file  Food Insecurity:   . Worried About Programme researcher, broadcasting/film/video in the Last Year: Not on file  . Ran Out of Food in the Last Year: Not on file  Transportation Needs:   . Lack of Transportation (Medical): Not on file  . Lack of Transportation (Non-Medical): Not on file  Physical Activity:   . Days of Exercise per Week: Not on file  . Minutes of Exercise per Session: Not on file  Stress:   . Feeling of Stress : Not on file  Social Connections:   . Frequency of Communication with Friends and Family: Not on file  .  Frequency of Social Gatherings with Friends and Family: Not on file  . Attends Religious Services: Not on file  . Active Member of Clubs or Organizations: Not on file  . Attends Banker Meetings: Not on file  . Marital Status: Not on file  Intimate Partner Violence:   . Fear of Current or Ex-Partner: Not on file  . Emotionally Abused: Not on file  . Physically Abused: Not on file  . Sexually Abused: Not on file    FH:  Family History  Problem Relation Age of Onset  . Heart failure Mother        Stent put in 2014 for blockage  . COPD Mother   . Hypertension Mother   . Hyperlipidemia Mother   . Heart disease Mother   . Thyroid disease Mother   . Cancer Mother   . Depression Mother   . Anxiety disorder Mother   . Heart disease Father   . Heart disease Son        Repair ASD  . Neuropathy Neg Hx     Past Medical History:  Diagnosis Date  . Anemia    as child in first grade  . Anxiety   . Arthritis   . Back pain   . Constipation   . CREST syndrome (HCC)   . Dyspnea   . Family history of adverse reaction to anesthesia    mom has had problems in past - N/V post op on one occasion; difficult time waking up on another occasion  . Gallbladder problem   . GERD (gastroesophageal reflux disease)   . Gestational diabetes mellitus in childbirth, diet controlled 1987  . IBS (irritable bowel syndrome)   . Joint pain   . Leg edema   . Menopause   . Osteoarthritis (arthritis due to wear and tear of joints)   . PAH (pulmonary artery hypertension) (HCC)   . Pulmonary fibrosis (HCC)   . Raynaud disease    hands-toes  . Scleroderma (HCC)   . Shortness of breath dyspnea    pulmonary fibrosis-scleraderma  . Sleep apnea   . Swallowing difficulty   . Thyroid cyst   . Vitamin D deficiency   . Wears glasses     Current Outpatient Medications  Medication Sig Dispense Refill  . baclofen (LIORESAL) 10 MG tablet Take 1 tablet (10 mg total) by mouth at bedtime. 90 each 3   . buPROPion (WELLBUTRIN XL) 150 MG 24 hr tablet Take 150 mg by mouth daily.    [name] desloratadine (CLARINEX) 5 MG tablet Take 5 mg by mouth daily.    . diclofenac sodium (  VOLTAREN) 1 % GEL Apply 2-4 g topically 4 (four) times daily as needed (for knee pain.).    [name] fluticasone (FLONASE) 50 MCG/ACT nasal spray Place 1 spray into both nostrils as needed for allergies or rhinitis. Aller-Flo    . furosemide (LASIX) 20 MG tablet TAKE 1 TABLET (20 MG TOTAL) BY MOUTH DAILY. 90 tablet 3  . gabapentin (NEURONTIN) 100 MG capsule Take 1 capsule (100 mg total) by mouth at bedtime. TAKE 1 CAPSULE BY MOUTH THREE TIMES A DAY 90 capsule 3  . levothyroxine (SYNTHROID) 50 MCG tablet Take 50 mcg by mouth daily before breakfast.    . macitentan (OPSUMIT) 10 MG tablet Take 1 tablet (10 mg total) by mouth daily. 30 tablet 11  . Probiotic Product (PROBIOTIC PO) Take 1 capsule by mouth daily.    . TRINTELLIX 5 MG TABS tablet Take 5 mg by mouth daily.    . Vitamin D, Ergocalciferol, (DRISDOL) 1.25 MG (50000 UNIT) CAPS capsule Take 1 capsule (50,000 Units total) by mouth every 7 (seven) days. 12 capsule 0   No current facility-administered medications for this encounter.    Vitals:   09/22/20 1405  BP: 130/88  Pulse: 74  SpO2: 100%  Weight: 94.6 kg (208 lb 9.6 oz)  Height: 5\' 4"  (1.626 m)   Filed Weights   09/22/20 1405  Weight: 94.6 kg (208 lb 9.6 oz)   Wt Readings from Last 3 Encounters:  09/22/20 94.6 kg (208 lb 9.6 oz)  08/25/20 95.8 kg (211 lb 3.2 oz)  07/07/20 92.1 kg (203 lb)    PHYSICAL EXAM:  General:  Well appearing. No resp difficulty HEENT: normal Neck: supple. no JVD. Carotids 2+ bilat; no bruits. No lymphadenopathy or thryomegaly appreciated. Cor: PMI nondisplaced. Regular rate & rhythm. No rubs, gallops or murmurs. Lungs: clear Abdomen: obese soft, nontender, nondistended. No hepatosplenomegaly. No bruits or masses. Good bowel sounds. Extremities: no cyanosis, clubbing, rash,  edema Neuro: alert & orientedx3, cranial nerves grossly intact. moves all 4 extremities w/o difficulty. Affect pleasant    ASSESSMENT & PLAN:  1. Pulmonary HTN - mild PAH in setting of scleroderma. - RHC 02/2017: PA 61/23 (39), PCW 8. - Now with progressive NYHA III symptoms - Will need repeat RHC to assess need for combination therapy - Continue opsimut 10 mg daily 2. Chronic Diastolic HF - Echo 01/2017: EF 02/2017, RV normal - NYHA class III (worse) - Volume status stable - Continue lasix 20 mg daily - RHC as above 3. OSA - No longer wearing.CPAP - snoring and excessive daytime fatigue very concerning for OSA - set up sleep study - follow with Dr. 16-10%  4. Scleroderma - stable No recent flares 5. Chronic respiratory failure with exertional desaturations - Follows with Dr Vassie Loll - now with nighttime O2 - PFTs 01/2017: DLCO 60% 6. 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 I personally reviewed laboratory data, imaging studies and relevant notes. I independently examined the patient and formulated the important aspects of the plan. I have edited the note to reflect any of my changes or salient points. I have personally discussed the plan with the patient and/or family.  Overall doing fairly well. NYHA II. Volume status looks good Currently on singe therapy for PAH. Will check echo. If pressure normal will continue single therapy. If pressures mild to moderately elevated would add Adcrica or  sildenafil. If significantly elevated will consider repeat RHC for further evaluation.   Shanessa Hodak, MD  2:46 PM     

## 2020-09-22 NOTE — H&P (View-Only) (Signed)
Advanced Heart Failure Clinic Note   Patient ID: Rachael Camacho, female   DOB: [birth year], 54 y.o.   MRN: [id number] PCP: Primary Cardiologist: Dr Jens Som.  Primary HF: Dr Gala Romney Pulmonary: Dr Vassie Loll.  Rheumatologist: Dr. Kathi Ludwig [facility]   HPI: Rachael Camacho is a 54 year old with a history of  OSA, chornic diastolic HF, obesity, scleroderma, and Raynauds disease referred to HF clinic by Dr Vassie Loll.   Echo 7/21 EF 60-65% RV normal. TR jet inadequate to assess PA pressures. Personally reviewed  Started on macitenan 10 mg daily.   She presents today for PAH follow up. We have not seen her since 2019. Over past few months has had increased exertional dyspnea and more fatigue. Excessive daytime fatigue. Falls asleep during the day. Taking 5hr energy drinks. Now fe Saw Dr. Vassie Loll and had overnight oximetry. O2 started. Was on CPAP prior to weight loss surgery but off since 2016. Mild lower extremity edema. No recent flares of CTD.   PFTs 5/21 FEV1 2.59 (99%) FVC 3.17 (95%) DLCO 53%  ONOX: 7/21 1hr 28 min with sats < 88% (lowest 76%)   Studies/Tests   01/2015- 93% at rest but non walking she desaturate to 84%   Echo 11/12/14 showed grade 2 diastolic dysfunction, with normal systolic function Echo  01/29/17  EF 60% normal RV no PAH  CT angio 11/2014 was negative for pulmonary embolism, but showed mosaic pattern suggestive of early interstitial edema >> no response to Lasix CT angio 12/16: No ILD  VQ 7/16: No PE  Home sleep study 02/22/15.  Consistent with mild sleep apnea Desaturation noted.   RHC 02/2017: RA = 7 RV = 62/11 PA = 61/23 (39) PCW = 8 Fick cardiac output/index = 5.8/3.0 PVR = 5.3 WU Ao sat = 98% PA sat = 74%, 73% Mild to moderate PAH in setting of scleroderma.  Plan/Discussion: Start Macitentan 10mg  daily.  PFTs 03/09/15 no obstruction, mild restriction TLC 78%, DLCO 52% predicted  PFTs  11/16 FEV1 2.75L FVC   3.17L DLCO 52%  PFTs  01/2017: FEV1: 2.83 FVC: 3.42 DLCO: 60%  RHC 12/16 RA = 7 RV = 42/2/8 PA = 44/16 (28) PCW = 9 Fick cardiac output/index = 6.9/3.6 PVR = 2.8 WU Ao sat = 95% PA sat = 73% 76%  RHC [facility] 03/14/2015  RA 13/12 mean 10 mm Hg RV 53/11 mm Hg PA 50/19 mean 33 mm Hg  PCWP 18/16 mean 14 mm Hg LV 169/13/22 mm Hg  AO 165/92 mean 119 mm Hg Oxygen saturations: PA 73% AO 95% Cardiac Output (Fick) 6.9 L/min  Cardiac Index (Fick) 3.2 L/min/meter squared PVR = 2.8 WU No significant CAD     Review of systems complete and found to be negative unless listed in HPI.   SH:  Social History   Socioeconomic History  . Marital status: Married    Spouse name: Rachael Camacho  . Number of children: 2  . Years of education: Not on file  . Highest education level: Not on file  Occupational History  . Occupation: Rachael Camacho    Comment: Office Work  Tobacco Use  . Smoking status: Never Smoker  . Smokeless tobacco: Never Used  Vaping Use  . Vaping Use: Never used  Substance and Sexual Activity  . Alcohol use: Yes    Alcohol/week: 0.0 standard drinks    Comment: seldom  . Drug use: No  . Sexual activity: Not on file  Other Topics Concern  .  Not on file  Social History Narrative   Lives at home with her husband   Right handed   Social Determinants of Health   Financial Resource Strain:   . Difficulty of Paying Living Expenses: Not on file  Food Insecurity:   . Worried About Programme researcher, broadcasting/film/video in the Last Year: Not on file  . Ran Out of Food in the Last Year: Not on file  Transportation Needs:   . Lack of Transportation (Medical): Not on file  . Lack of Transportation (Non-Medical): Not on file  Physical Activity:   . Days of Exercise per Week: Not on file  . Minutes of Exercise per Session: Not on file  Stress:   . Feeling of Stress : Not on file  Social Connections:   . Frequency of Communication with Friends and Family: Not on file  .  Frequency of Social Gatherings with Friends and Family: Not on file  . Attends Religious Services: Not on file  . Active Member of Clubs or Organizations: Not on file  . Attends Banker Meetings: Not on file  . Marital Status: Not on file  Intimate Partner Violence:   . Fear of Current or Ex-Partner: Not on file  . Emotionally Abused: Not on file  . Physically Abused: Not on file  . Sexually Abused: Not on file    FH:  Family History  Problem Relation Age of Onset  . Heart failure Mother        Stent put in 2014 for blockage  . COPD Mother   . Hypertension Mother   . Hyperlipidemia Mother   . Heart disease Mother   . Thyroid disease Mother   . Cancer Mother   . Depression Mother   . Anxiety disorder Mother   . Heart disease Father   . Heart disease Son        Repair ASD  . Neuropathy Neg Hx     Past Medical History:  Diagnosis Date  . Anemia    as child in first grade  . Anxiety   . Arthritis   . Back pain   . Constipation   . CREST syndrome (HCC)   . Dyspnea   . Family history of adverse reaction to anesthesia    mom has had problems in past - N/V post op on one occasion; difficult time waking up on another occasion  . Gallbladder problem   . GERD (gastroesophageal reflux disease)   . Gestational diabetes mellitus in childbirth, diet controlled 1987  . IBS (irritable bowel syndrome)   . Joint pain   . Leg edema   . Menopause   . Osteoarthritis (arthritis due to wear and tear of joints)   . PAH (pulmonary artery hypertension) (HCC)   . Pulmonary fibrosis (HCC)   . Raynaud disease    hands-toes  . Scleroderma (HCC)   . Shortness of breath dyspnea    pulmonary fibrosis-scleraderma  . Sleep apnea   . Swallowing difficulty   . Thyroid cyst   . Vitamin D deficiency   . Wears glasses     Current Outpatient Medications  Medication Sig Dispense Refill  . baclofen (LIORESAL) 10 MG tablet Take 1 tablet (10 mg total) by mouth at bedtime. 90 each 3   . buPROPion (WELLBUTRIN XL) 150 MG 24 hr tablet Take 150 mg by mouth daily.    [name] desloratadine (CLARINEX) 5 MG tablet Take 5 mg by mouth daily.    . diclofenac sodium (  VOLTAREN) 1 % GEL Apply 2-4 g topically 4 (four) times daily as needed (for knee pain.).    [name] fluticasone (FLONASE) 50 MCG/ACT nasal spray Place 1 spray into both nostrils as needed for allergies or rhinitis. Aller-Flo    . furosemide (LASIX) 20 MG tablet TAKE 1 TABLET (20 MG TOTAL) BY MOUTH DAILY. 90 tablet 3  . gabapentin (NEURONTIN) 100 MG capsule Take 1 capsule (100 mg total) by mouth at bedtime. TAKE 1 CAPSULE BY MOUTH THREE TIMES A DAY 90 capsule 3  . levothyroxine (SYNTHROID) 50 MCG tablet Take 50 mcg by mouth daily before breakfast.    . macitentan (OPSUMIT) 10 MG tablet Take 1 tablet (10 mg total) by mouth daily. 30 tablet 11  . Probiotic Product (PROBIOTIC PO) Take 1 capsule by mouth daily.    . TRINTELLIX 5 MG TABS tablet Take 5 mg by mouth daily.    . Vitamin D, Ergocalciferol, (DRISDOL) 1.25 MG (50000 UNIT) CAPS capsule Take 1 capsule (50,000 Units total) by mouth every 7 (seven) days. 12 capsule 0   No current facility-administered medications for this encounter.    Vitals:   09/22/20 1405  BP: 130/88  Pulse: 74  SpO2: 100%  Weight: 94.6 kg (208 lb 9.6 oz)  Height: 5\' 4"  (1.626 m)   Filed Weights   09/22/20 1405  Weight: 94.6 kg (208 lb 9.6 oz)   Wt Readings from Last 3 Encounters:  09/22/20 94.6 kg (208 lb 9.6 oz)  08/25/20 95.8 kg (211 lb 3.2 oz)  07/07/20 92.1 kg (203 lb)    PHYSICAL EXAM:  General:  Well appearing. No resp difficulty HEENT: normal Neck: supple. no JVD. Carotids 2+ bilat; no bruits. No lymphadenopathy or thryomegaly appreciated. Cor: PMI nondisplaced. Regular rate & rhythm. No rubs, gallops or murmurs. Lungs: clear Abdomen: obese soft, nontender, nondistended. No hepatosplenomegaly. No bruits or masses. Good bowel sounds. Extremities: no cyanosis, clubbing, rash,  edema Neuro: alert & orientedx3, cranial nerves grossly intact. moves all 4 extremities w/o difficulty. Affect pleasant    ASSESSMENT & PLAN:  1. Pulmonary HTN - mild PAH in setting of scleroderma. - RHC 02/2017: PA 61/23 (39), PCW 8. - Now with progressive NYHA III symptoms - Will need repeat RHC to assess need for combination therapy - Continue opsimut 10 mg daily 2. Chronic Diastolic HF - Echo 01/2017: EF 02/2017, RV normal - NYHA class III (worse) - Volume status stable - Continue lasix 20 mg daily - RHC as above 3. OSA - No longer wearing.CPAP - snoring and excessive daytime fatigue very concerning for OSA - set up sleep study - follow with Dr. 16-10%  4. Scleroderma - stable No recent flares 5. Chronic respiratory failure with exertional desaturations - Follows with Dr Vassie Loll - now with nighttime O2 - PFTs 01/2017: DLCO 60% 6. 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 I personally reviewed laboratory data, imaging studies and relevant notes. I independently examined the patient and formulated the important aspects of the plan. I have edited the note to reflect any of my changes or salient points. I have personally discussed the plan with the patient and/or family.  Overall doing fairly well. NYHA II. Volume status looks good Currently on singe therapy for PAH. Will check echo. If pressure normal will continue single therapy. If pressures mild to moderately elevated would add Adcrica or  sildenafil. If significantly elevated will consider repeat RHC for further evaluation.   Arvilla Meresaniel Sina Sumpter, MD  2:46 PM

## 2020-09-24 ENCOUNTER — Other Ambulatory Visit (HOSPITAL_COMMUNITY)
Admission: RE | Admit: 2020-09-24 | Discharge: 2020-09-24 | Disposition: A | Discharge status: Home / Self Care | Payer: BC Managed Care – PPO | Source: Ambulatory Visit | Attending: Internal Medicine | Admitting: Internal Medicine

## 2020-09-24 DIAGNOSIS — Z20822 Contact with and (suspected) exposure to covid-19: Secondary | ICD-10-CM | POA: Diagnosis not present

## 2020-09-24 DIAGNOSIS — Z01812 Encounter for preprocedural laboratory examination: Secondary | ICD-10-CM | POA: Diagnosis not present

## 2020-09-24 LAB — SARS CORONAVIRUS 2 (TAT 6-24 HRS): SARS Coronavirus 2: NEGATIVE

## 2020-09-27 ENCOUNTER — Ambulatory Visit (HOSPITAL_COMMUNITY)
Admission: RE | Admit: 2020-09-27 | Discharge: 2020-09-27 | Disposition: A | Discharge status: Home / Self Care | Payer: BC Managed Care – PPO | Attending: Internal Medicine | Admitting: Internal Medicine

## 2020-09-27 ENCOUNTER — Other Ambulatory Visit: Payer: Self-pay

## 2020-09-27 ENCOUNTER — Encounter (HOSPITAL_COMMUNITY)
Admission: RE | Disposition: A | Discharge status: Home / Self Care | Payer: Self-pay | Source: Home / Self Care | Attending: Internal Medicine

## 2020-09-27 DIAGNOSIS — Z791 Long term (current) use of non-steroidal anti-inflammatories (NSAID): Secondary | ICD-10-CM | POA: Insufficient documentation

## 2020-09-27 DIAGNOSIS — G4733 Obstructive sleep apnea (adult) (pediatric): Secondary | ICD-10-CM | POA: Insufficient documentation

## 2020-09-27 DIAGNOSIS — K219 Gastro-esophageal reflux disease without esophagitis: Secondary | ICD-10-CM | POA: Insufficient documentation

## 2020-09-27 DIAGNOSIS — I2721 Secondary pulmonary arterial hypertension: Secondary | ICD-10-CM | POA: Diagnosis present

## 2020-09-27 DIAGNOSIS — E559 Vitamin D deficiency, unspecified: Secondary | ICD-10-CM | POA: Diagnosis not present

## 2020-09-27 DIAGNOSIS — J8410 Pulmonary fibrosis, unspecified: Secondary | ICD-10-CM | POA: Insufficient documentation

## 2020-09-27 DIAGNOSIS — Z79890 Hormone replacement therapy (postmenopausal): Secondary | ICD-10-CM | POA: Insufficient documentation

## 2020-09-27 DIAGNOSIS — J9610 Chronic respiratory failure, unspecified whether with hypoxia or hypercapnia: Secondary | ICD-10-CM | POA: Insufficient documentation

## 2020-09-27 DIAGNOSIS — Z9884 Bariatric surgery status: Secondary | ICD-10-CM | POA: Insufficient documentation

## 2020-09-27 DIAGNOSIS — M349 Systemic sclerosis, unspecified: Secondary | ICD-10-CM | POA: Insufficient documentation

## 2020-09-27 DIAGNOSIS — I5032 Chronic diastolic (congestive) heart failure: Secondary | ICD-10-CM | POA: Insufficient documentation

## 2020-09-27 DIAGNOSIS — F419 Anxiety disorder, unspecified: Secondary | ICD-10-CM | POA: Diagnosis not present

## 2020-09-27 DIAGNOSIS — Z6835 Body mass index (BMI) 35.0-35.9, adult: Secondary | ICD-10-CM | POA: Diagnosis not present

## 2020-09-27 DIAGNOSIS — K589 Irritable bowel syndrome without diarrhea: Secondary | ICD-10-CM | POA: Insufficient documentation

## 2020-09-27 DIAGNOSIS — E669 Obesity, unspecified: Secondary | ICD-10-CM | POA: Diagnosis not present

## 2020-09-27 DIAGNOSIS — I7300 Raynaud's syndrome without gangrene: Secondary | ICD-10-CM | POA: Diagnosis not present

## 2020-09-27 DIAGNOSIS — Z79899 Other long term (current) drug therapy: Secondary | ICD-10-CM | POA: Diagnosis not present

## 2020-09-27 DIAGNOSIS — Z8249 Family history of ischemic heart disease and other diseases of the circulatory system: Secondary | ICD-10-CM | POA: Diagnosis not present

## 2020-09-27 DIAGNOSIS — I272 Pulmonary hypertension, unspecified: Secondary | ICD-10-CM

## 2020-09-27 HISTORY — PX: RIGHT HEART CATH: CATH118263

## 2020-09-27 LAB — POCT I-STAT EG7
Acid-Base Excess: 0 mmol/L (ref 0.0–2.0)
Acid-base deficit: 1 mmol/L (ref 0.0–2.0)
Acid-base deficit: 1 mmol/L (ref 0.0–2.0)
Bicarbonate: 24.1 mmol/L (ref 20.0–28.0)
Bicarbonate: 25.4 mmol/L (ref 20.0–28.0)
Bicarbonate: 24.5 mmol/L (ref 20.0–28.0)
Calcium, Ion: 1.07 mmol/L — ABNORMAL LOW (ref 1.15–1.40)
Calcium, Ion: 1.18 mmol/L (ref 1.15–1.40)
Calcium, Ion: 1.04 mmol/L — ABNORMAL LOW (ref 1.15–1.40)
HCT: 33 % — ABNORMAL LOW (ref 36.0–46.0)
HCT: 36 % (ref 36.0–46.0)
HCT: 34 % — ABNORMAL LOW (ref 36.0–46.0)
Hemoglobin: 11.2 g/dL — ABNORMAL LOW (ref 12.0–15.0)
Hemoglobin: 12.2 g/dL (ref 12.0–15.0)
Hemoglobin: 11.6 g/dL — ABNORMAL LOW (ref 12.0–15.0)
O2 Saturation: 75 %
O2 Saturation: 77 %
O2 Saturation: 69 %
Potassium: 3.2 mmol/L — ABNORMAL LOW (ref 3.5–5.1)
Potassium: 3.5 mmol/L (ref 3.5–5.1)
Potassium: 3.3 mmol/L — ABNORMAL LOW (ref 3.5–5.1)
Sodium: 145 mmol/L (ref 135–145)
Sodium: 147 mmol/L — ABNORMAL HIGH (ref 135–145)
Sodium: 147 mmol/L — ABNORMAL HIGH (ref 135–145)
TCO2: 25 mmol/L (ref 22–32)
TCO2: 27 mmol/L (ref 22–32)
TCO2: 26 mmol/L (ref 22–32)
pCO2, Ven: 40.1 mmHg — ABNORMAL LOW (ref 44.0–60.0)
pCO2, Ven: 42 mmHg — ABNORMAL LOW (ref 44.0–60.0)
pCO2, Ven: 42.8 mmHg — ABNORMAL LOW (ref 44.0–60.0)
pH, Ven: 7.386 (ref 7.250–7.430)
pH, Ven: 7.39 (ref 7.250–7.430)
pH, Ven: 7.367 (ref 7.250–7.430)
pO2, Ven: 41 mmHg (ref 32.0–45.0)
pO2, Ven: 42 mmHg (ref 32.0–45.0)
pO2, Ven: 37 mmHg (ref 32.0–45.0)

## 2020-09-27 LAB — POCT I-STAT 7, (LYTES, BLD GAS, ICA,H+H)
Acid-Base Excess: 0 mmol/L (ref 0.0–2.0)
Bicarbonate: 24.5 mmol/L (ref 20.0–28.0)
Calcium, Ion: 1.23 mmol/L (ref 1.15–1.40)
HCT: 36 % (ref 36.0–46.0)
Hemoglobin: 12.2 g/dL (ref 12.0–15.0)
O2 Saturation: 99 %
Potassium: 3.7 mmol/L (ref 3.5–5.1)
Sodium: 143 mmol/L (ref 135–145)
TCO2: 26 mmol/L (ref 22–32)
pCO2 arterial: 36.9 mmHg (ref 32.0–48.0)
pH, Arterial: 7.43 (ref 7.350–7.450)
pO2, Arterial: 120 mmHg — ABNORMAL HIGH (ref 83.0–108.0)

## 2020-09-27 SURGERY — RIGHT HEART CATH
Anesthesia: LOCAL

## 2020-09-27 MED ORDER — ASPIRIN 81 MG PO CHEW: 81.0000 mg | CHEWABLE_TABLET | ORAL | Status: DC

## 2020-09-27 MED ORDER — SODIUM CHLORIDE 0.9 % IV SOLN: INTRAVENOUS | Status: DC

## 2020-09-27 MED ORDER — LIDOCAINE HCL (PF) 1 % IJ SOLN
INTRAMUSCULAR | Status: AC
  Filled 2020-09-27: qty 30

## 2020-09-27 MED ORDER — SODIUM CHLORIDE 0.9% FLUSH: 3.0000 mL | INTRAVENOUS | Status: DC | PRN

## 2020-09-27 MED ORDER — HEPARIN (PORCINE) IN NACL 1000-0.9 UT/500ML-% IV SOLN
INTRAVENOUS | Status: AC
  Filled 2020-09-27: qty 500

## 2020-09-27 MED ORDER — SODIUM CHLORIDE 0.9 % IV SOLN: 250.0000 mL | INTRAVENOUS | Status: DC | PRN

## 2020-09-27 MED ORDER — ACETAMINOPHEN 325 MG PO TABS: 650.0000 mg | ORAL_TABLET | ORAL | Status: DC | PRN

## 2020-09-27 MED ORDER — LIDOCAINE HCL (PF) 1 % IJ SOLN
INTRAMUSCULAR | Status: DC | PRN
  Administered 2020-09-27: 2 mL

## 2020-09-27 MED ORDER — HEPARIN (PORCINE) IN NACL 1000-0.9 UT/500ML-% IV SOLN
INTRAVENOUS | Status: DC | PRN
  Administered 2020-09-27: 500 mL

## 2020-09-27 MED ORDER — HYDRALAZINE HCL 20 MG/ML IJ SOLN: 10.0000 mg | INTRAMUSCULAR | Status: DC | PRN

## 2020-09-27 MED ORDER — SODIUM CHLORIDE 0.9% FLUSH: 3.0000 mL | Freq: Two times a day (BID) | INTRAVENOUS | Status: DC

## 2020-09-27 MED ORDER — LABETALOL HCL 5 MG/ML IV SOLN: 10.0000 mg | INTRAVENOUS | Status: DC | PRN

## 2020-09-27 MED ORDER — ONDANSETRON HCL 4 MG/2ML IJ SOLN: 4.0000 mg | Freq: Four times a day (QID) | INTRAMUSCULAR | Status: DC | PRN

## 2020-09-27 SURGICAL SUPPLY — 5 items
CATH BALLN WEDGE 5F 110CM (CATHETERS) ×1 IMPLANT
PACK CARDIAC CATHETERIZATION (CUSTOM PROCEDURE TRAY) ×2 IMPLANT
SHEATH GLIDE SLENDER 4/5FR (SHEATH) ×1 IMPLANT
SHEATH PROBE COVER 6X72 (BAG) ×1 IMPLANT
TRANSDUCER W/STOPCOCK (MISCELLANEOUS) ×2 IMPLANT

## 2020-09-27 NOTE — Interval H&P Note (Signed)
History and Physical Interval Note:  09/27/2020 2:14 PM  Rachael Camacho  has presented today for surgery, with the diagnosis of hp.  The various methods of treatment have been discussed with the patient and family. After consideration of risks, benefits and other options for treatment, the patient has consented to  Procedure(s): RIGHT HEART CATH (N/A) as a surgical intervention.  The patient's history has been reviewed, patient examined, no change in status, stable for surgery.  I have reviewed the patient's chart and labs.  Questions were answered to the patient's satisfaction.     Jannah Guardiola

## 2020-09-27 NOTE — Discharge Instructions (Signed)
Call Dr Bensimhon's office if any problems, questions, or concerns; call if any redness,drainage,fever,pain, swelling or redness at left arm site

## 2020-09-27 NOTE — Research (Signed)
PHDEV Informed Consent   Subject Name: Rachael Camacho  Subject met inclusion and exclusion criteria.  The informed consent form, study requirements and expectations were reviewed with the subject and questions and concerns were addressed prior to the signing of the consent form.  The subject verbalized understanding of the trail requirements.  The subject agreed to participate in the PHDEV trial and signed the informed consent.  The informed consent was obtained prior to performance of any protocol-specific procedures for the subject.  A copy of the signed informed consent was given to the subject and a copy was placed in the subject's medical record.  McChesney, Marilyn K. 09/27/2020, 12:25 pm  

## 2020-09-28 ENCOUNTER — Other Ambulatory Visit (INDEPENDENT_AMBULATORY_CARE_PROVIDER_SITE_OTHER): Payer: Self-pay | Admitting: Family Medicine

## 2020-09-28 ENCOUNTER — Encounter (HOSPITAL_COMMUNITY): Payer: Self-pay | Admitting: Internal Medicine

## 2020-09-28 DIAGNOSIS — E559 Vitamin D deficiency, unspecified: Secondary | ICD-10-CM

## 2020-10-20 ENCOUNTER — Encounter (INDEPENDENT_AMBULATORY_CARE_PROVIDER_SITE_OTHER): Payer: Self-pay | Admitting: Family Medicine

## 2020-10-20 ENCOUNTER — Other Ambulatory Visit: Payer: Self-pay

## 2020-10-20 ENCOUNTER — Ambulatory Visit (INDEPENDENT_AMBULATORY_CARE_PROVIDER_SITE_OTHER): Payer: BC Managed Care – PPO | Admitting: Family Medicine

## 2020-10-20 VITALS — BP 115/79 | HR 88 | Temp 98.0°F | Ht 64.0 in | Wt 204.0 lb

## 2020-10-20 DIAGNOSIS — Z9189 Other specified personal risk factors, not elsewhere classified: Secondary | ICD-10-CM | POA: Diagnosis not present

## 2020-10-20 DIAGNOSIS — G4733 Obstructive sleep apnea (adult) (pediatric): Secondary | ICD-10-CM

## 2020-10-20 DIAGNOSIS — E559 Vitamin D deficiency, unspecified: Secondary | ICD-10-CM | POA: Diagnosis not present

## 2020-10-20 DIAGNOSIS — I2721 Secondary pulmonary arterial hypertension: Secondary | ICD-10-CM | POA: Diagnosis not present

## 2020-10-20 DIAGNOSIS — Z6835 Body mass index (BMI) 35.0-35.9, adult: Secondary | ICD-10-CM

## 2020-10-20 MED ORDER — VITAMIN D (ERGOCALCIFEROL) 1.25 MG (50000 UNIT) PO CAPS: 50000.0000 [IU] | ORAL_CAPSULE | ORAL | 0 refills | Status: DC

## 2020-10-24 NOTE — Progress Notes (Signed)
Chief Complaint:   OBESITY Rachael Camacho is here to discuss her progress with her obesity treatment plan along with follow-up of her obesity related diagnoses. Rachael Camacho is on the Category 2 Plan and states she is following her eating plan approximately 0% of the time. Rachael Camacho states she is doing 0 minutes 0 times per week.  Today's visit was #: 29 Starting weight: 207 lbs Starting date: 09/10/2017 Today's weight: 204 lbs Today's date: 10/20/2020 Total lbs lost to date: 3 Total lbs lost since last in-office visit: 0  Interim History: Rachael Camacho's last visit was approximately 3 months ago. She has done well maintaining her weight loss. She is getting ready to put her house up for sale and is working on cleaning and packing.  Subjective:   1. Vitamin D deficiency Rachael Camacho is stable on Vit D, and she denies nausea or vomiting. She requests a refill today.  2. PAH (pulmonary artery hypertension) (HCC) I reviewed Rachael Camacho's recent labs and tests, as well as Cardiology notes. Her PAH is mild and appears to be controlled on her medications.  3. OSA (obstructive sleep apnea) Rachael Camacho has a history of sleep apnea, improved after weight loss surgery but she notes increased fatigue. She is planning on having a sleep study.  4. At risk for complication associated with hypotension The patient is at a higher than average risk of hypotension due to low blood pressure.  Assessment/Plan:   1. Vitamin D deficiency Low Vitamin D level contributes to fatigue and are associated with obesity, breast, and colon cancer. We will refill prescription Vitamin D for 90 days, with no refill. We will plan to recheck labs in 3 months. [place] will follow-up for routine testing of Vitamin D, at least 2-3 times per year to avoid over-replacement.  - Vitamin D, Ergocalciferol, (DRISDOL) 1.25 MG (50000 UNIT) CAPS capsule; Take 1 capsule (50,000 Units total) by mouth every 7 (seven) days.  Dispense: 12 capsule; Refill: 0  2. PAH  (pulmonary artery hypertension) (HCC) Rachael Camacho is to continue to work on weight loss, and follow up with her new Cardiologist.  3. OSA (obstructive sleep apnea) Intensive lifestyle modifications are the first line treatment for this issue. We discussed several lifestyle modifications today. Rachael Camacho is to continue to work on weight loss, and continue to follow up with her primary care physician. We will continue to monitor. Orders and follow up as documented in patient record.   4. At risk for complication associated with hypotension Rachael Camacho was given approximately 15 minutes of education and counseling today to help avoid hypotension. We discussed risks of hypotension with weight loss and signs of hypotension such as feeling lightheaded or unsteady.  Repetitive spaced learning was employed today to elicit superior memory formation and behavioral change.  5. Class 2 severe obesity with serious comorbidity and body mass index (BMI) of 35.0 to 35.9 in adult, unspecified obesity type (HCC) Rachael Camacho is currently in the action stage of change. As such, her goal is to maintain weight for now. She has agreed to practicing portion control and making smarter food choices, such as increasing vegetables and decreasing simple carbohydrates.   Rachael Camacho's goal is to continue to work on maintaining her weight, and avoid weight gain. She may return to the clinic if she would like when needed.  Behavioral modification strategies: better snacking choices.  Rachael Camacho has agreed to follow-up with our clinic in as needed. She was informed of the importance of frequent follow-up visits to maximize her success with intensive  lifestyle modifications for her multiple health conditions.   Objective:   Blood pressure 115/79, pulse 88, temperature 98 F (36.7 C), height 5\' 4"  (1.626 m), weight 204 lb (92.5 kg), SpO2 95 %. Body mass index is 35.02 kg/m.  General: Cooperative, alert, well developed, in no acute distress. HEENT:  Conjunctivae and lids unremarkable. Cardiovascular: Regular rhythm.  Lungs: Normal work of breathing. Neurologic: No focal deficits.   Lab Results  Component Value Date   CREATININE 0.97 09/22/2020   BUN 15 09/22/2020   NA 147 (H) 09/27/2020   K 3.3 (L) 09/27/2020   CL 106 09/22/2020   CO2 28 09/22/2020   Lab Results  Component Value Date   ALT 17 05/12/2020   AST 21 05/12/2020   ALKPHOS 97 05/12/2020   BILITOT 0.6 05/12/2020   Lab Results  Component Value Date   HGBA1C 4.8 03/23/2020   HGBA1C 5.0 11/10/2019   HGBA1C 4.9 06/25/2018   HGBA1C 4.9 12/10/2017   HGBA1C 5.0 09/10/2017   Lab Results  Component Value Date   INSULIN 6.7 05/12/2020   INSULIN CANCELED 03/23/2020   INSULIN 9.9 11/10/2019   INSULIN 8.2 06/25/2018   INSULIN 4.4 12/10/2017   Lab Results  Component Value Date   TSH 1.080 05/12/2020   Lab Results  Component Value Date   CHOL 169 05/12/2020   HDL 71 05/12/2020   LDLCALC 82 05/12/2020   TRIG 85 05/12/2020   Lab Results  Component Value Date   WBC 7.0 09/22/2020   HGB 11.6 (L) 09/27/2020   HCT 34.0 (L) 09/27/2020   MCV 93.5 09/22/2020   PLT 272 09/22/2020   No results found for: IRON, TIBC, FERRITIN  Attestation Statements:   Reviewed by clinician on day of visit: allergies, medications, problem list, medical history, surgical history, family history, social history, and previous encounter notes.   I, Curtisha Bendix, am acting as transcriptionist for Burt Knack, MD.  I have reviewed the above documentation for accuracy and completeness, and I agree with the above. -  Quillian Quince, MD

## 2021-01-14 ENCOUNTER — Other Ambulatory Visit (INDEPENDENT_AMBULATORY_CARE_PROVIDER_SITE_OTHER): Payer: Self-pay | Admitting: Family Medicine

## 2021-01-14 DIAGNOSIS — E559 Vitamin D deficiency, unspecified: Secondary | ICD-10-CM

## 2021-01-16 NOTE — Telephone Encounter (Signed)
Ok to rf x 1 but vit D needs to be checked before it gets refilled again.

## 2021-01-16 NOTE — Telephone Encounter (Signed)
Noted  

## 2021-01-16 NOTE — Telephone Encounter (Signed)
Dr.Beasley 

## 2021-01-16 NOTE — Telephone Encounter (Signed)
Would you like to refill? Last office visit instructed pt to return PRN.

## 2021-02-05 NOTE — Progress Notes (Signed)
[facility] Note   Patient ID: Rachael Camacho, female   DOB: 04/26/1966, 55 y.o.   MRN: [id number] PCP: Primary Cardiologist: Dr Jens Som.  Primary HF: Dr Gala Romney Pulmonary: Dr Vassie Loll.  Rheumatologist: Dr. Kathi Ludwig [facility]   HPI: Rachael Camacho is a 55 year old with a history of  OSA, chornic diastolic HF, obesity, scleroderma, and Raynauds disease referred to HF clinic by Dr Vassie Loll.   Echo 7/21 EF 60-65% RV normal. TR jet inadequate to assess PA pressures. Personally reviewed  Remains on macitenan 10 mg daily.   She is here for f/u. At last visit she had worsening symptoms. Underwent repeat RHC in 10/21 as below. Says she remains very tired. Has not had sleep study yet. Gets fatigued with any activity. Sleeping more. Falls asleep easily. + SOB with exertion.  No edema, orthopnea or PND. No syncope. No dizziness.  Was on CPAP prior to weight loss surgery but off since 2016. Had ONOX with Dr. Vassie Loll and started on nighttime O2 - but no longer wearing O2. She has been told that she snores.   RA = 6 RV = 40/5 PA = 39/14 (25) PCW = 10 Fick cardiac output/index = 6.4/3.2 PVR = 2.2 WU FA sat = 99% PA sat = 76%, 77% SVG sat = 69%   Studies/Tests   PFTs 5/21 FEV1 2.59 (99%) FVC 3.17 (95%) DLCO 53%  ONOX: 7/21 1hr 28 min with sats < 88% (lowest 76%)   01/2015- 93% at rest but non walking she desaturate to 84%   Echo 11/12/14 showed grade 2 diastolic dysfunction, with normal systolic function Echo  01/29/17  EF 60% normal RV no PAH  CT angio 11/2014 was negative for pulmonary embolism, but showed mosaic pattern suggestive of early interstitial edema >> no response to Lasix CT angio 12/16: No ILD  VQ 7/16: No PE  Home sleep study 02/22/15.  Consistent with mild sleep apnea Desaturation noted.   RHC 02/2017: RA = 7 RV = 62/11 PA = 61/23 (39) PCW = 8 Fick cardiac output/index = 5.8/3.0 PVR = 5.3 WU Ao sat = 98% PA sat = 74%, 73% Mild to moderate  PAH in setting of scleroderma.  Plan/Discussion: Start Macitentan 10mg  daily.  PFTs 03/09/15 no obstruction, mild restriction TLC 78%, DLCO 52% predicted  PFTs  11/16 FEV1 2.75L FVC   3.17L DLCO 52%  PFTs 01/2017: FEV1: 2.83 FVC: 3.42 DLCO: 60%  PFTs 04/2020: FEV1: 2.59 FVC: 3.17 DLCO: 53%  RHC 12/16 RA = 7 RV = 42/2/8 PA = 44/16 (28) PCW = 9 Fick cardiac output/index = 6.9/3.6 PVR = 2.8 WU Ao sat = 95% PA sat = 73% 76%  RHC [facility] 03/14/2015  RA 13/12 mean 10 mm Hg RV 53/11 mm Hg PA 50/19 mean 33 mm Hg  PCWP 18/16 mean 14 mm Hg LV 169/13/22 mm Hg  AO 165/92 mean 119 mm Hg Oxygen saturations: PA 73% AO 95% Cardiac Output (Fick) 6.9 L/min  Cardiac Index (Fick) 3.2 L/min/meter squared PVR = 2.8 WU No significant CAD     Review of systems complete and found to be negative unless listed in HPI.   SH:  Social History   Socioeconomic History  . Marital status: Married    Spouse name: Rachael Camacho  . Number of children: 2  . Years of education: Not on file  . Highest education level: Not on file  Occupational History  . 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 Smoking status: Never Smoker  . Smokeless tobacco: Never Used  Vaping Use  . Vaping Use: Never used  Substance and Sexual Activity  . Alcohol use: Yes    Alcohol/week: 0.0 standard drinks    Comment: seldom  . Drug use: No  . Sexual activity: Not on file  Other Topics Concern  . Not on file  Social History Narrative   Lives at home with her husband   Right handed   Social Determinants of Health   Financial Resource Strain: Not on file  Food Insecurity: Not on file  Transportation Needs: Not on file  Physical Activity: Not on file  Stress: Not on file  Social Connections: Not on file  Intimate Partner Violence: Not on file    FH:  Family History  Problem Relation Age of Onset  . Heart failure Mother        Stent put in  2014 for blockage  . COPD Mother   . Hypertension Mother   . Hyperlipidemia Mother   . Heart disease Mother   . Thyroid disease Mother   . Cancer Mother   . Depression Mother   . Anxiety disorder Mother   . Heart disease Father   . Heart disease Son        Repair ASD  . Neuropathy Neg Hx     Past Medical History:  Diagnosis Date  . Anemia    as child in first grade  . Anxiety   . Arthritis   . Back pain   . Constipation   . CREST syndrome (HCC)   . Dyspnea   . Family history of adverse reaction to anesthesia    mom has had problems in past - N/V post op on one occasion; difficult time waking up on another occasion  . Gallbladder problem   . GERD (gastroesophageal reflux disease)   . Gestational diabetes mellitus in childbirth, diet controlled 1987  . IBS (irritable bowel syndrome)   . Joint pain   . Leg edema   . Menopause   . Osteoarthritis (arthritis due to wear and tear of joints)   . PAH (pulmonary artery hypertension) (HCC)   . Pulmonary fibrosis (HCC)   . Raynaud disease    hands-toes  . Scleroderma (HCC)   . Shortness of breath dyspnea    pulmonary fibrosis-scleraderma  . Sleep apnea   . Swallowing difficulty   . Thyroid cyst   . Vitamin D deficiency   . Wears glasses     Current Outpatient Medications  Medication Sig Dispense Refill  . baclofen (LIORESAL) 10 MG tablet Take 5 mg by mouth at bedtime. Take 1/2 tablet (5mg  total) by mouth once daily at bedtime.    buPROPion (WELLBUTRIN XL) 150 MG 24 hr tablet Take 150 mg by mouth daily.    [name] desloratadine (CLARINEX) 5 MG tablet Take 5 mg by mouth at bedtime.     . diclofenac sodium (VOLTAREN) 1 % GEL Apply 2-4 g topically 4 (four) times daily as needed (for knee pain.).    [name] fluticasone (FLONASE) 50 MCG/ACT nasal spray Place 1 spray into both nostrils daily as needed for allergies or rhinitis.     . furosemide (LASIX) 20 MG tablet TAKE 1 TABLET (20 MG TOTAL) BY MOUTH DAILY. (Patient taking differently:  Take 40 mg by mouth daily. ) 90 tablet 3  . gabapentin (NEURONTIN) 100 MG capsule Take 1 capsule (100 mg total) by mouth  at bedtime. TAKE 1 CAPSULE BY MOUTH THREE TIMES A DAY (Patient taking differently: Take 100 mg by mouth at bedtime. ) 90 capsule 3  . levothyroxine (SYNTHROID) 50 MCG tablet Take 50 mcg by mouth daily before breakfast.    . macitentan (OPSUMIT) 10 MG tablet Take 1 tablet (10 mg total) by mouth daily. 30 tablet 11  . NONFORMULARY OR COMPOUNDED ITEM Apply 1 application topically daily.    . Probiotic Product (PROBIOTIC PO) Take 1 capsule by mouth in the morning and at bedtime.     . TRINTELLIX 5 MG TABS tablet Take 5 mg by mouth daily.    . Vitamin D, Ergocalciferol, (DRISDOL) 1.25 MG (50000 UNIT) CAPS capsule Take 1 capsule by mouth once weekly. **Needs labs prior to additional refills.** 12 capsule 0   No current facility-administered medications for this encounter.    Vitals:   02/06/21 1454  BP: 130/90  Pulse: 76  SpO2: 97%  Weight: 93.6 kg (206 lb 6.4 oz)    Wt Readings from Last 3 Encounters:  10/20/20 92.5 kg (204 lb)  09/27/20 94.3 kg (208 lb)  09/22/20 94.6 kg (208 lb 9.6 oz)    PHYSICAL EXAM:  General:  Well appearing. No resp difficulty HEENT: normal Neck: supple. no JVD. Carotids 2+ bilat; no bruits. No lymphadenopathy or thryomegaly appreciated. Cor: PMI nondisplaced. Regular rate & rhythm. No rubs, gallops or murmurs. Lungs: clear Abdomen: obese soft, nontender, nondistended. No hepatosplenomegaly. No bruits or masses. Good bowel sounds. Extremities: no cyanosis, clubbing, rash, edema + sclerosctyly Neuro: alert & orientedx3, cranial nerves grossly intact. moves all 4 extremities w/o difficulty. Affect pleasant   ASSESSMENT & PLAN:  1. Pulmonary HTN - mild PAH in setting of scleroderma. - RHC 02/2017: PA 61/23 (39), PCW 8. - RHC 10/21: PA = 39/14 (25) PCW 10  PVR 2.2  - Echo 7/21 EF 60-65% Diastolic function felt to be normal. RV normal  -  NYHA III - RHC likely doesn't warrant a second agent at this time  - Continue opsimut 10 mg daily - Repeat echo in 6 months   2. Chronic Diastolic HF - Echo 01/2017: EF 43-32%, RV normal - Echo 7/21 EF 60-65% Diastolic function felt to be normal. RV normal  - Remains NYHA III - Volume status stable - Continue lasix 20 mg daily - RHC as above  3. OSA - No longer wearing.CPAP - snoring and excessive daytime fatigue very concerning for OSA - will set up home sleep study today with [facility] - follow with Dr. Vassie Loll   4. Scleroderma - stable No recent flares  5. Chronic respiratory failure with exertional desaturations - Follows with Dr Vassie Loll - now with nighttime O2 but not wearing it  - PFTs stable   6. 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

## 2021-02-06 ENCOUNTER — Ambulatory Visit (HOSPITAL_COMMUNITY)
Admission: RE | Admit: 2021-02-06 | Discharge: 2021-02-06 | Disposition: A | Discharge status: Home / Self Care | Payer: BC Managed Care – PPO | Source: Ambulatory Visit | Attending: Internal Medicine | Admitting: Internal Medicine

## 2021-02-06 ENCOUNTER — Other Ambulatory Visit: Payer: Self-pay

## 2021-02-06 ENCOUNTER — Encounter (HOSPITAL_COMMUNITY): Payer: Self-pay | Admitting: Internal Medicine

## 2021-02-06 VITALS — BP 130/90 | HR 76 | Wt 206.4 lb

## 2021-02-06 DIAGNOSIS — M349 Systemic sclerosis, unspecified: Secondary | ICD-10-CM | POA: Diagnosis not present

## 2021-02-06 DIAGNOSIS — Z79899 Other long term (current) drug therapy: Secondary | ICD-10-CM | POA: Diagnosis not present

## 2021-02-06 DIAGNOSIS — R0683 Snoring: Secondary | ICD-10-CM

## 2021-02-06 DIAGNOSIS — G4733 Obstructive sleep apnea (adult) (pediatric): Secondary | ICD-10-CM

## 2021-02-06 DIAGNOSIS — R400 Somnolence: Secondary | ICD-10-CM | POA: Diagnosis not present

## 2021-02-06 DIAGNOSIS — I7300 Raynaud's syndrome without gangrene: Secondary | ICD-10-CM | POA: Insufficient documentation

## 2021-02-06 DIAGNOSIS — I272 Pulmonary hypertension, unspecified: Secondary | ICD-10-CM

## 2021-02-06 DIAGNOSIS — R0600 Dyspnea, unspecified: Secondary | ICD-10-CM

## 2021-02-06 DIAGNOSIS — I5032 Chronic diastolic (congestive) heart failure: Secondary | ICD-10-CM

## 2021-02-06 DIAGNOSIS — I2721 Secondary pulmonary arterial hypertension: Secondary | ICD-10-CM | POA: Insufficient documentation

## 2021-02-06 DIAGNOSIS — E669 Obesity, unspecified: Secondary | ICD-10-CM | POA: Insufficient documentation

## 2021-02-06 DIAGNOSIS — Z8249 Family history of ischemic heart disease and other diseases of the circulatory system: Secondary | ICD-10-CM | POA: Diagnosis not present

## 2021-02-06 DIAGNOSIS — J9610 Chronic respiratory failure, unspecified whether with hypoxia or hypercapnia: Secondary | ICD-10-CM | POA: Diagnosis not present

## 2021-02-06 NOTE — Addendum Note (Signed)
Encounter addended by: Chinita Pester, CMA on: 02/06/2021 3:37 PM  Actions taken: Clinical Note Signed

## 2021-02-06 NOTE — Patient Instructions (Addendum)
Labs done today. We will contact you only if your labs are abnormal.  No medication changes were made. Please continue all current medications as prescribed.  Your physician recommends that you schedule a follow-up appointment in: 6 months with an echo prior to your appointment. Please contact our office in July to schedule an August appointment.  Your physician has requested that you have an echocardiogram. Echocardiography is a painless test that uses sound waves to create images of your heart. It provides your doctor with information about the size and shape of your heart and how well your heart's chambers and valves are working. This procedure takes approximately one hour. There are no restrictions for this procedure.  Your provider has recommended that you have a home sleep study.  We have provided you with the equipment in our office today. Please download the app and follow the instructions. YOUR PIN NUMBER IS: [id number]. Once you have completed the test you just dispose of the equipment, the information is automatically uploaded to [place] via blue-tooth technology. If your test is positive for sleep apnea and you need a home CPAP machine you will be contacted by Dr Norris Cross office [facility]) to set this up.    If you have any questions or concerns before your next appointment please send [place] a message through [place] or call our office at [phone].    TO LEAVE A MESSAGE FOR THE NURSE SELECT OPTION 2, PLEASE LEAVE A MESSAGE INCLUDING: . YOUR NAME . DATE OF BIRTH . CALL BACK NUMBER . REASON FOR CALL**this is important as we prioritize the call backs  YOU WILL RECEIVE A CALL BACK THE SAME DAY AS LONG AS YOU CALL BEFORE 4:00 PM   Do the following things EVERYDAY: 1) Weigh yourself in the morning before breakfast. Write it down and keep it in a log. 2) Take your medicines as prescribed 3) Eat low salt foods--Limit salt (sodium) to 2000 mg per day.  4) Stay as active as you can  everyday 5) Limit all fluids for the day to less than 2 liters   At the [facility], you and your health needs are our priority. As part of our continuing mission to provide you with exceptional heart care, we have created designated Provider Care Teams. These Care Teams include your primary Cardiologist (physician) and Advanced Practice Providers (APPs- Physician Assistants and Nurse Practitioners) who all work together to provide you with the care you need, when you need it.   You may see any of the following providers on your designated Care Team at your next follow up: [name] Dr Arvilla Meres . Dr Marca Ancona . Tonye Becket, NP . Robbie Lis, PA . Karle Plumber, PharmD   Please be sure to bring in all your medications bottles to every appointment.

## 2021-02-06 NOTE — Addendum Note (Signed)
Encounter addended by: Chinita Pester, CMA on: 02/06/2021 3:26 PM  Actions taken: Visit diagnoses modified, Order list changed, Diagnosis association updated, Follow-up modified, Clinical Note Signed

## 2021-02-06 NOTE — Progress Notes (Signed)
Patient Name: Rachael Camacho        DOB: 10/17/1966      Height: 5'4    Weight:206.4lbs  Office Name: [facility]         Referring Provider: Arvilla Meres, MD  Today's Date:02/06/21  Date:02/06/21   STOP BANG RISK ASSESSMENT S (snore) Have you been told that you snore?     YES   T (tired) Are you often tired, fatigued, or sleepy during the day?   YES  O (obstruction) Do you stop breathing, choke, or gasp during sleep? NO   P (pressure) Do you have or are you being treated for high blood pressure? NO   B (BMI) Is your body index greater than 35 kg/m? YES   A (age) Are you 97 years old or older? YES   N (neck) Do you have a neck circumference greater than 16 inches?   YES/NO   G (gender) Are you a female? NO   TOTAL STOP/BANG "YES" ANSWERS 4                                                                       For Office Use Only              Procedure Order Form    YES to 3+ Stop Bang questions OR two clinical symptoms - patient qualifies for [facility] (CPT 95800)     Submit: This Form + Patient Face Sheet + Clinical Note via [employer] or Fax: [phone]         Clinical Notes: Will consult Sleep Specialist and refer for management of therapy due to patient increased risk of Sleep Apnea. Ordering a sleep study due to the following two clinical symptoms: Excessive daytime sleepiness G47.10 / Loud snoring R06.83  I understand that I am proceeding with a home sleep apnea test as ordered by my treating physician. I understand that untreated sleep apnea is a serious cardiovascular risk factor and it is my responsibility to perform the test and seek management for sleep apnea. I will be contacted with the results and be managed for sleep apnea by a local sleep physician. I will be receiving equipment and further instructions from [facility]. I shall promptly ship back the equipment via the included mailing label. I understand my insurance will be billed for  the test and as the patient I am responsible for any insurance related out-of-pocket costs incurred. I have been provided with written instructions and can call for additional video or telephonic instruction, with 24-hour availability of qualified personnel to answer any questions: Patient Help Desk [phone].  Patient Signature ______________________________________________________   Date______________________ Patient Telemedicine Verbal Consent

## 2021-02-06 NOTE — Addendum Note (Signed)
Encounter addended by: Chinita Pester, CMA on: 02/06/2021 3:27 PM  Actions taken: Clinical Note Signed

## 2021-02-09 ENCOUNTER — Telehealth (HOSPITAL_COMMUNITY): Payer: Self-pay | Admitting: *Deleted

## 2021-02-09 NOTE — Telephone Encounter (Signed)
No pre cert reqd.   [place] sleep study  Routed to [facility]

## 2021-02-23 NOTE — Telephone Encounter (Signed)
Per note from [facility] no precert for [id number] required.  Call to patient went thru instructions she verbalized understanding and plans to test tonight or tomorrow night.

## 2021-02-26 ENCOUNTER — Encounter (INDEPENDENT_AMBULATORY_CARE_PROVIDER_SITE_OTHER): Payer: BC Managed Care – PPO | Admitting: Cardiology

## 2021-02-26 DIAGNOSIS — G4733 Obstructive sleep apnea (adult) (pediatric): Secondary | ICD-10-CM

## 2021-02-28 ENCOUNTER — Ambulatory Visit: Payer: BC Managed Care – PPO

## 2021-02-28 DIAGNOSIS — I272 Pulmonary hypertension, unspecified: Secondary | ICD-10-CM

## 2021-02-28 DIAGNOSIS — R0600 Dyspnea, unspecified: Secondary | ICD-10-CM

## 2021-02-28 DIAGNOSIS — G4733 Obstructive sleep apnea (adult) (pediatric): Secondary | ICD-10-CM

## 2021-02-28 NOTE — Procedures (Signed)
  Sleep Study Report  Patient Information Name: Rachael Camacho  ID: [id number] Birth Date: March 23, 1966  Age: 55  Gender: Female Sleep Study Information Study Date:02/26/2021 Referring Physician: Arvilla Meres, MD  TEST DESCRIPTION: Home sleep apnea testing was completed using the WatchPat, a Type 1 device, utilizing  peripheral arterial tonometry (PAT), chest movement, actigraphy, pulse oximetry, pulse rate, body position and snore.  AHI was calculated with apnea and hypopnea using valid sleep time as the denominator. RDI includes apneas,  hypopneas, and RERAs. The data acquired and the scoring of sleep and all associated events were performed in  accordance with the recommended standards and specifications as outlined in the AASM Manual for the Scoring of  Sleep and Associated Events 2.2.0 (2015).  FINDINGS: 1. Severe Obstructive Sleep Apnea with AHI 43.9/hr with most events occurring in the supine position.  2. Minimal Central Sleep Apnea with pAHIc 1.6/hr. 3. Oxygen desaturations as low as 81%. 4. Mild to moderate snoring was present. O2 sats were < 88% for 43.8 min. 5. Total sleep time was 7 hrs and 19 min. 6. 25.1% of total sleep time was spent in REM sleep.  7. Short sleep onset latency at 5 min.  8. Normal REM sleep onset latency at 80 min.  9. Total awakenings were 18.   DIAGNOSIS:  Severe Obstructive Sleep Apnea (G47.33)  RECOMMENDATIONS: 1. Clinical correlation of these findings is necessary. The decision to treat obstructive sleep apnea (OSA) is usually  based on the presence of apnea symptoms or the presence of associated medical conditions such as Hypertension,  Congestive Heart Failure, Atrial Fibrillation or Obesity. The most common symptoms of OSA are snoring, gasping for  breath while sleeping, daytime sleepiness and fatigue.   2. Initiating apnea therapy is recommended given the presence of symptoms and/or associated conditions.  Recommend proceeding with  one of the following:   a. Auto-CPAP therapy with a pressure range of 5-20cm H2O.   b. An oral appliance (OA) that can be obtained from certain dentists with expertise in sleep medicine. These are  primarily of use in non-obese patients with mild and moderate disease.   c. An ENT consultation which may be useful to look for specific causes of obstruction and possible treatment  Options.   d. If patient is intolerant to PAP therapy, consider referral to ENT for evaluation for hypoglossal nerve stimulator.   3. Close follow-up is necessary to ensure success with CPAP or oral appliance therapy for maximum benefit .  4. A follow-up oximetry study on CPAP is recommended to assess the adequacy of therapy and determine the need  for supplemental oxygen or the potential need for Bi-level therapy. An arterial blood gas to determine the adequacy of  baseline ventilation and oxygenation should also be considered.  5. Healthy sleep recommendations include: adequate nightly sleep (normal 7-9 hrs/night), avoidance of caffeine after  noon and alcohol near bedtime, and maintaining a sleep environment that is cool, dark and quiet. 6. Weight loss for overweight patients is recommended. Even modest amounts of weight loss can significantly  improve the severity of sleep apnea.  7. Snoring recommendations include: weight loss where appropriate, side sleeping, and avoidance of alcohol before  Bed.  8. Operation of motor vehicle or dangerous equipment must be avoided when feeling drowsy, excessively sleepy, or  mentally fatigued.  Report prepared by: Signature: Armanda Magic, MD [facility], Diplomat American Board of Sleep Medicine  Electronically Signed: Feb 28, 2021

## 2021-03-02 ENCOUNTER — Telehealth: Payer: Self-pay | Admitting: *Deleted

## 2021-03-02 NOTE — Telephone Encounter (Signed)
Patient notified of sleep study results and recommendations. All questions were answered satisfactorily. Patient states that she actually has a CPAP machine that is about 38-56 years old. She will take [facility] to [facility] to see if it can be linked before she purchases a new one. I told her that it may be too old. I informed her that she qualifies for a new machine after 5 years. She states that if her machine is too old to link she will go ahead and purchase the new one. Ordered by Dr Mayford Knife. CPAP orders sent to [facility].

## 2021-03-02 NOTE — Telephone Encounter (Addendum)
Informed patient of sleep study results and patient understanding was verbalized. Patient understands her sleep study showed they have sleep apnea. Order ResMed auto CPAP from 4 to 15cm H2O with heated humidity and mask of [facility]. Followup with me in 6 weeks.     Upon patient request DME selection is [facility]. Patient understands she/he will be contacted by [facility] to set up her/he cpap. Patient understands to call if [facility] does not contact her/he with new setup in a timely manner. Patient understands they will be called once confirmation has been received from [facility] that they have received their new machine to schedule 10 week follow up appointment.   [facility] notified of new cpap order  Please add to airview Patient was grateful for the call and thanked me.

## 2021-03-02 NOTE — Telephone Encounter (Signed)
-----   Message from Quintella Reichert, MD sent at 02/28/2021 12:43 PM EST ----- Please let patient know that they have sleep apnea.  Order ResMed auto CPAP from 4 to 18cm H2O with heated humidity and mask of [facility]. Followup with me in 6 weeks.

## 2021-03-29 NOTE — Telephone Encounter (Signed)
Patient was calling back with a follow up on the status of her cpap machine that was suppose to be order. Please advise

## 2021-04-03 NOTE — Telephone Encounter (Signed)
Reached out to the patient and explained about the backlog of orders all the dme's have and supplied her with the number to call  [facility] and get her status of where she stands in the line to get set up.Pt is aware and agreeable to treatment.

## 2021-05-26 ENCOUNTER — Telehealth (HOSPITAL_COMMUNITY): Payer: Self-pay | Admitting: *Deleted

## 2021-05-26 NOTE — Telephone Encounter (Signed)
Pt's Ops[employer]it needs a PA, form completed, signed by Dr Gala Romney and faxed to [facility] at [phone], will await approval

## 2021-06-27 ENCOUNTER — Other Ambulatory Visit (HOSPITAL_COMMUNITY): Payer: Self-pay | Admitting: Internal Medicine

## 2021-07-14 ENCOUNTER — Encounter (HOSPITAL_COMMUNITY): Payer: Self-pay | Admitting: *Deleted

## 2021-07-20 ENCOUNTER — Telehealth: Payer: Self-pay | Admitting: Cardiology

## 2021-07-20 NOTE — Telephone Encounter (Signed)
Gave the patient the number to call [facility] and get her status of where she stands in the line to get set up.Pt is aware and agreeable to treatment.

## 2021-07-20 NOTE — Telephone Encounter (Signed)
Patient states she is following up regarding a new CPAP order. Please return call to discuss.

## 2021-07-25 NOTE — Telephone Encounter (Signed)
Advanced Heart Failure Patient Advocate Encounter  Prior Authorization for [employer] has been approved.    Effective dates: 05/29/21 through 05/29/22  Archer Asa, CPhT

## 2021-09-03 ENCOUNTER — Other Ambulatory Visit: Payer: Self-pay | Admitting: Adult Health

## 2021-09-05 ENCOUNTER — Telehealth (INDEPENDENT_AMBULATORY_CARE_PROVIDER_SITE_OTHER): Payer: BC Managed Care – PPO | Admitting: Adult Health

## 2021-09-05 DIAGNOSIS — G500 Trigeminal neuralgia: Secondary | ICD-10-CM | POA: Diagnosis not present

## 2021-09-05 NOTE — Progress Notes (Signed)
PATIENT: Rachael Camacho DOB: January 24, 1966  REASON FOR VISIT: follow up HISTORY FROM: patient  Virtual Visit via Video Note  I connected with Burley Saver on 09/05/21 at  3:15 PM EDT by a video enabled telemedicine application located remotely at [facility] and verified that I am speaking with the correct person using two identifiers who was located at their own home.   I discussed the limitations of evaluation and management by telemedicine and the availability of in person appointments. The patient expressed understanding and agreed to proceed.   PATIENT: Rachael Camacho DOB: 28-Jun-1966  REASON FOR VISIT: follow up HISTORY FROM: patient  HISTORY OF PRESENT ILLNESS: Today 09/05/21:  Rachael Camacho is a 55 year old female with a history of trigeminal neuralgia on the right side.  She returns today.  She continues on 5 mg of baclofen and 100 mg of gabapentin at bedtime.  She reports that this has been controlling her symptoms.  She states that typical triggers are when she yawns or if she bites on something hard.  Overall she feels her symptoms are well controlled.  She joins me today for virtual visit  HISTORY 08/25/20:   Rachael Camacho is a 55 year old female with a history of trigeminal neuralgia.  She reports that as long as she takes baclofen and gabapentin she has been doing well.  Reports that she has been out of her baclofen for several months.  She reports that discomfort in the right side of the face is slowly coming back.  She reports that on occasion if she yawns she feels pain.  She states there are times she feels that she is getting an earache or if she bites down on food she will have an electric-like pain.  She denies any new symptoms.  Returns today for an evaluation.   REVIEW OF SYSTEMS: Out of a complete 14 system review of symptoms, the patient complains only of the following symptoms, and all other reviewed systems are  negative.  ALLERGIES: Allergies  Allergen Reactions   Amoxicillin Other (See Comments)    Immediate yeast infection  Has patient had a PCN reaction causing immediate rash, facial/tongue/throat swelling, SOB or lightheadedness with hypotension: No Has patient had a PCN reaction causing severe rash involving mucus membranes or skin necrosis: No Has patient had a PCN reaction that required hospitalization: No Has patient had a PCN reaction occurring within the last 10 years: No If all of the above answers are "NO", then may proceed with Cephalosporin use.    Percocet [Oxycodone-Acetaminophen] Itching    Tolerates acetaminophen alone   [name] [Nitrofurantoin Monohyd Macro] Rash and Hives   [name] Hives and Rash    Reported chicken pox-like rash, not hives   Sulfa Antibiotics Rash and Hives    HOME MEDICATIONS: Outpatient Medications Prior to Visit  Medication Sig Dispense Refill   baclofen (LIORESAL) 10 MG tablet Take 5 mg by mouth at bedtime. Take 1/2 tablet (5mg  total) by mouth once daily at bedtime.     buPROPion (WELLBUTRIN XL) 150 MG 24 hr tablet Take 150 mg by mouth daily.     desloratadine (CLARINEX) 5 MG tablet Take 5 mg by mouth at bedtime.      diclofenac sodium (VOLTAREN) 1 % GEL Apply 2-4 g topically 4 (four) times daily as needed (for knee pain.).     fluticasone (FLONASE) 50 MCG/ACT nasal spray Place 1 spray into both nostrils daily as needed for allergies or rhinitis.  furosemide (LASIX) 40 MG tablet Take 40 mg by mouth daily.     gabapentin (NEURONTIN) 100 MG capsule Take 100 mg by mouth at bedtime.     levothyroxine (SYNTHROID) 50 MCG tablet Take 50 mcg by mouth daily before breakfast.     OPSUMIT 10 MG tablet TAKE 1 TABLET BY MOUTH DAILY. DO NOT HANDLE IF PREGNANT. DO NOT SPLIT, CRUSH, OR CHEW. REVIEW MEDICATION GUIDE. CALL [phone] FOR REFILLS. 30 tablet 10   Probiotic Product (PROBIOTIC PO) Take 1 capsule by mouth in the morning and at bedtime.       TRINTELLIX 5 MG TABS tablet Take 5 mg by mouth daily.     No facility-administered medications prior to visit.    PAST MEDICAL HISTORY: Past Medical History:  Diagnosis Date   Anemia    as child in first grade   Anxiety    Arthritis    Back pain    Constipation    CREST syndrome (HCC)    Dyspnea    Family history of adverse reaction to anesthesia    mom has had problems in past - N/V post op on one occasion; difficult time waking up on another occasion   Gallbladder problem    GERD (gastroesophageal reflux disease)    Gestational diabetes mellitus in childbirth, diet controlled 1987   IBS (irritable bowel syndrome)    Joint pain    Leg edema    Menopause    Osteoarthritis (arthritis due to wear and tear of joints)    PAH (pulmonary artery hypertension) (HCC)    Pulmonary fibrosis (HCC)    Raynaud disease    hands-toes   Scleroderma (HCC)    Shortness of breath dyspnea    pulmonary fibrosis-scleraderma   Sleep apnea    Swallowing difficulty    Thyroid cyst    Vitamin D deficiency    Wears glasses     PAST SURGICAL HISTORY: Past Surgical History:  Procedure Laterality Date   CARDIAC CATHETERIZATION N/A 11/25/2015   Procedure: Right Heart Cath;  Surgeon: Dolores Patty, MD;  Location: [facility] INVASIVE CV LAB;  Service: Cardiovascular;  Laterality: N/A;   CESAREAN SECTION  87,95   CHOLECYSTECTOMY  1996   lap choli   COLONOSCOPY     ESOPHAGOGASTRODUODENOSCOPY (EGD) WITH PROPOFOL N/A 02/12/2017   Procedure: ESOPHAGOGASTRODUODENOSCOPY (EGD) WITH PROPOFOL;  Surgeon: Vida Rigger, MD;  Location: [facility] ENDOSCOPY;  Service: Endoscopy;  Laterality: N/A;   HIATAL HERNIA REPAIR  07/26/2015   Procedure: LAPAROSCOPIC REPAIR OF HIATAL HERNIA;  Surgeon: Luretha Murphy, MD;  Location: [facility] ORS;  Service: General;;   LAPAROSCOPIC GASTRIC SLEEVE RESECTION N/A 07/26/2015   Procedure: LAPAROSCOPIC GASTRIC SLEEVE RESECTION;  Surgeon: Luretha Murphy, MD;  Location: [facility] ORS;  Service: General;   Laterality: N/A;   LEFT AND RIGHT HEART CATHETERIZATION WITH CORONARY ANGIOGRAM N/A 03/14/2015   Procedure: LEFT AND RIGHT HEART CATHETERIZATION WITH CORONARY ANGIOGRAM;  Surgeon: Peter M Swaziland, MD;  Location: [facility] CATH LAB;  Service: Cardiovascular;  Laterality: N/A;   POSTERIOR CERVICAL FUSION/FORAMINOTOMY N/A 01/16/2018   Procedure: Cervical one-two Posterior instrumentation and fusion;  Surgeon: Tressie Stalker, MD;  Location[facility] OR;  Service: Neurosurgery;  Laterality: N/A;   RADIAL HEAD ARTHROPLASTY Right 07/28/2014   Procedure: RIGHT RADIAL HEAD REPLACEMENT;  Surgeon: Dairl Ponder, MD;  Location:  SURGERY CENTER;  Service: Orthopedics;  Laterality: Right;   RIGHT HEART CATH N/A 03/18/2017   Procedure: Right Heart Cath;  Surgeon: Dolores Patty, MD;  Location: [facility] INVASIVE  CV LAB;  Service: Cardiovascular;  Laterality: N/A;   RIGHT HEART CATH N/A 09/27/2020   Procedure: RIGHT HEART CATH;  Surgeon: Dolores Patty, MD;  Location: [facility] INVASIVE CV LAB;  Service: Cardiovascular;  Laterality: N/A;   SAVORY DILATION N/A 02/12/2017   Procedure: SAVORY DILATION;  Surgeon: Vida Rigger, MD;  Location: [facility] ENDOSCOPY;  Service: Endoscopy;  Laterality: N/A;   TUBAL LIGATION     UPPER GI ENDOSCOPY  07/26/2015   Procedure: UPPER GI ENDOSCOPY;  Surgeon: Luretha Murphy, MD;  Location: [facility] ORS;  Service: General;;   VAGINAL HYSTERECTOMY  2010   LAVH    FAMILY HISTORY: Family History  Problem Relation Age of Onset   Heart failure Mother        Stent put in 2014 for blockage   COPD Mother    Hypertension Mother    Hyperlipidemia Mother    Heart disease Mother    Thyroid disease Mother    Cancer Mother    Depression Mother    Anxiety disorder Mother    Heart disease Father    Heart disease Son        Repair ASD   Neuropathy Neg Hx     SOCIAL HISTORY: Social History   Socioeconomic History   Marital status: Married    Spouse name: W Katurah Karapetian   Number of children: 2   Years of  education: Not on file   Highest education level: Not on file  Occupational History   Occupation: Facilities manager    Comment: Office Work  Tobacco Use   Smoking status: Never   Smokeless tobacco: Never  Vaping Use   Vaping Use: Never used  Substance and Sexual Activity   Alcohol use: Yes    Alcohol/week: 0.0 standard drinks    Comment: seldom   Drug use: No   Sexual activity: Not on file  Other Topics Concern   Not on file  Social History Narrative   Lives at home with her husband   Right handed   Social Determinants of Health   Financial Resource Strain: Not on file  Food Insecurity: Not on file  Transportation Needs: Not on file  Physical Activity: Not on file  Stress: Not on file  Social Connections: Not on file  Intimate Partner Violence: Not on file      PHYSICAL EXAM Generalized: Well developed, in no acute distress   Neurological examination  Mentation: Alert oriented to time, place, history taking. Follows all commands speech and language fluent Cranial nerve II-XII:Extraocular movements were full. Facial symmetry noted. uvula tongue midline. Head turning and shoulder shrug  were normal and symmetric. Motor: Good strength throughout subjectively per patient Sensory: Sensory testing is intact to soft touch on all 4 extremities subjectively per patient Coordination: Cerebellar testing reveals good finger-nose-finger  Gait and station: Patient is able to stand from a seated position.  Reflexes: UTA  DIAGNOSTIC DATA (LABS, IMAGING, TESTING) - I reviewed patient records, labs, notes, testing and imaging myself where available.  Lab Results  Component Value Date   WBC 7.0 09/22/2020   HGB 11.6 (L) 09/27/2020   HCT 34.0 (L) 09/27/2020   MCV 93.5 09/22/2020   PLT 272 09/22/2020      Component Value Date/Time   NA 147 (H) 09/27/2020 1431   NA 142 05/12/2020 1221   K 3.3 (L) 09/27/2020 1431   CL 106 09/22/2020 1522   CO2 28 09/22/2020  1522   GLUCOSE 94 09/22/2020 1522  BUN 15 09/22/2020 1522   BUN 15 05/12/2020 1221   CREATININE 0.97 09/22/2020 1522   CREATININE 0.75 03/08/2015 1501   CALCIUM 9.7 09/22/2020 1522   PROT 6.5 05/12/2020 1221   ALBUMIN 4.1 05/12/2020 1221   AST 21 05/12/2020 1221   ALT 17 05/12/2020 1221   ALKPHOS 97 05/12/2020 1221   BILITOT 0.6 05/12/2020 1221   GFRNONAA >60 09/22/2020 1522   GFRNONAA >89 03/08/2015 1501   GFRAA >60 09/22/2020 1522   GFRAA >89 03/08/2015 1501   Lab Results  Component Value Date   CHOL 169 05/12/2020   HDL 71 05/12/2020   LDLCALC 82 05/12/2020   TRIG 85 05/12/2020   Lab Results  Component Value Date   HGBA1C 4.8 03/23/2020   Lab Results  Component Value Date   VITAMINB12 433 11/10/2019   Lab Results  Component Value Date   TSH 1.080 05/12/2020      ASSESSMENT AND PLAN 55 y.o. year old female  has a past medical history of Anemia, Anxiety, Arthritis, Back pain, Constipation, CREST syndrome (HCC), Dyspnea, Family history of adverse reaction to anesthesia, Gallbladder problem, GERD (gastroesophageal reflux disease), Gestational diabetes mellitus in childbirth, diet controlled (1987), IBS (irritable bowel syndrome), Joint pain, Leg edema, Menopause, Osteoarthritis (arthritis due to wear and tear of joints), PAH (pulmonary artery hypertension) (HCC), Pulmonary fibrosis (HCC), Raynaud disease, Scleroderma (HCC), Shortness of breath dyspnea, Sleep apnea, Swallowing difficulty, Thyroid cyst, Vitamin D deficiency, and Wears glasses. here with:  Trigeminal neuralgia on the right  Continue baclofen 5 mg at bedtime Continue gabapentin 100 mg at bedtime Follow-up in 1 year or sooner if needed    Butch Penny, MSN, NP-C 09/05/2021, 3:19 PM [facility] [address] [place], [place] [address] [phone]

## 2021-11-04 ENCOUNTER — Other Ambulatory Visit: Payer: Self-pay | Admitting: Adult Health

## 2022-01-18 DIAGNOSIS — G4733 Obstructive sleep apnea (adult) (pediatric): Secondary | ICD-10-CM | POA: Diagnosis not present

## 2022-02-13 DIAGNOSIS — U071 COVID-19: Secondary | ICD-10-CM | POA: Diagnosis not present

## 2022-04-04 DIAGNOSIS — M19049 Primary osteoarthritis, unspecified hand: Secondary | ICD-10-CM | POA: Diagnosis not present

## 2022-04-04 DIAGNOSIS — M1990 Unspecified osteoarthritis, unspecified site: Secondary | ICD-10-CM | POA: Diagnosis not present

## 2022-04-04 DIAGNOSIS — M349 Systemic sclerosis, unspecified: Secondary | ICD-10-CM | POA: Diagnosis not present

## 2022-04-04 DIAGNOSIS — M341 CR(E)ST syndrome: Secondary | ICD-10-CM | POA: Diagnosis not present

## 2022-04-13 DIAGNOSIS — Z713 Dietary counseling and surveillance: Secondary | ICD-10-CM | POA: Diagnosis not present

## 2022-04-13 DIAGNOSIS — Z6830 Body mass index (BMI) 30.0-30.9, adult: Secondary | ICD-10-CM | POA: Diagnosis not present

## 2022-05-11 DIAGNOSIS — D225 Melanocytic nevi of trunk: Secondary | ICD-10-CM | POA: Diagnosis not present

## 2022-05-11 DIAGNOSIS — L814 Other melanin hyperpigmentation: Secondary | ICD-10-CM | POA: Diagnosis not present

## 2022-05-11 DIAGNOSIS — L820 Inflamed seborrheic keratosis: Secondary | ICD-10-CM | POA: Diagnosis not present

## 2022-05-11 DIAGNOSIS — D2261 Melanocytic nevi of right upper limb, including shoulder: Secondary | ICD-10-CM | POA: Diagnosis not present

## 2022-05-11 DIAGNOSIS — C44519 Basal cell carcinoma of skin of other part of trunk: Secondary | ICD-10-CM | POA: Diagnosis not present

## 2022-05-17 ENCOUNTER — Other Ambulatory Visit (HOSPITAL_COMMUNITY): Payer: Self-pay

## 2022-05-17 ENCOUNTER — Telehealth (HOSPITAL_COMMUNITY): Payer: Self-pay | Admitting: Pharmacist

## 2022-05-17 NOTE — Telephone Encounter (Signed)
Patient Advocate Encounter   Received notification from [facility] that prior authorization for O[employer] is required.   PA submitted on CoverMyMeds Key [id number] Status is pending   Will continue to follow.  Karle Plumber, PharmD, BCPS, BCCP, CPP Heart Failure Clinic Pharmacist [phone]

## 2022-05-18 NOTE — Telephone Encounter (Signed)
Advanced Heart Failure Patient Advocate Encounter  Prior Authorization for [employer] has been approved.    Effective dates: 05/17/22 through 05/18/23  Karle Plumber, PharmD, BCPS, BCCP, CPP Heart Failure Clinic Pharmacist [phone]

## 2022-06-12 ENCOUNTER — Other Ambulatory Visit: Payer: Self-pay | Admitting: Endocrinology

## 2022-06-12 DIAGNOSIS — E042 Nontoxic multinodular goiter: Secondary | ICD-10-CM

## 2022-06-19 ENCOUNTER — Ambulatory Visit
Admission: RE | Admit: 2022-06-19 | Discharge: 2022-06-19 | Disposition: A | Discharge status: Home / Self Care | Payer: BC Managed Care – PPO | Source: Ambulatory Visit | Attending: Endocrinology | Admitting: Endocrinology

## 2022-06-19 DIAGNOSIS — E042 Nontoxic multinodular goiter: Secondary | ICD-10-CM

## 2022-06-20 ENCOUNTER — Other Ambulatory Visit (HOSPITAL_COMMUNITY): Payer: Self-pay

## 2022-06-21 ENCOUNTER — Other Ambulatory Visit (HOSPITAL_COMMUNITY): Payer: Self-pay

## 2022-06-21 MED ORDER — OPSUMIT 10 MG PO TABS: ORAL_TABLET | ORAL | 11 refills | Status: DC

## 2022-06-25 DIAGNOSIS — G4733 Obstructive sleep apnea (adult) (pediatric): Secondary | ICD-10-CM | POA: Diagnosis not present

## 2022-06-28 DIAGNOSIS — E042 Nontoxic multinodular goiter: Secondary | ICD-10-CM | POA: Diagnosis not present

## 2022-06-28 DIAGNOSIS — E039 Hypothyroidism, unspecified: Secondary | ICD-10-CM | POA: Diagnosis not present

## 2022-07-12 DIAGNOSIS — E8881 Metabolic syndrome: Secondary | ICD-10-CM | POA: Diagnosis not present

## 2022-07-12 DIAGNOSIS — M349 Systemic sclerosis, unspecified: Secondary | ICD-10-CM | POA: Diagnosis not present

## 2022-07-12 DIAGNOSIS — Z79899 Other long term (current) drug therapy: Secondary | ICD-10-CM | POA: Diagnosis not present

## 2022-08-01 ENCOUNTER — Encounter (INDEPENDENT_AMBULATORY_CARE_PROVIDER_SITE_OTHER): Payer: Self-pay

## 2022-08-06 ENCOUNTER — Other Ambulatory Visit (HOSPITAL_COMMUNITY): Payer: Self-pay | Admitting: *Deleted

## 2022-08-06 DIAGNOSIS — I272 Pulmonary hypertension, unspecified: Secondary | ICD-10-CM

## 2022-08-08 ENCOUNTER — Ambulatory Visit (HOSPITAL_COMMUNITY)
Admission: RE | Admit: 2022-08-08 | Discharge: 2022-08-08 | Disposition: A | Discharge status: Home / Self Care | Payer: BC Managed Care – PPO | Source: Ambulatory Visit | Attending: Internal Medicine | Admitting: Internal Medicine

## 2022-08-08 ENCOUNTER — Other Ambulatory Visit (HOSPITAL_COMMUNITY): Payer: Self-pay | Admitting: *Deleted

## 2022-08-08 ENCOUNTER — Encounter (HOSPITAL_COMMUNITY): Payer: Self-pay | Admitting: Internal Medicine

## 2022-08-08 ENCOUNTER — Ambulatory Visit (HOSPITAL_BASED_OUTPATIENT_CLINIC_OR_DEPARTMENT_OTHER)
Admission: RE | Admit: 2022-08-08 | Discharge: 2022-08-08 | Disposition: A | Discharge status: Home / Self Care | Payer: BC Managed Care – PPO | Source: Ambulatory Visit | Attending: Family Medicine | Admitting: Family Medicine

## 2022-08-08 VITALS — BP 120/72 | HR 58 | Wt 215.0 lb

## 2022-08-08 DIAGNOSIS — Z9884 Bariatric surgery status: Secondary | ICD-10-CM | POA: Diagnosis not present

## 2022-08-08 DIAGNOSIS — Z79899 Other long term (current) drug therapy: Secondary | ICD-10-CM | POA: Diagnosis not present

## 2022-08-08 DIAGNOSIS — I272 Pulmonary hypertension, unspecified: Secondary | ICD-10-CM

## 2022-08-08 DIAGNOSIS — E669 Obesity, unspecified: Secondary | ICD-10-CM | POA: Diagnosis not present

## 2022-08-08 DIAGNOSIS — I371 Nonrheumatic pulmonary valve insufficiency: Secondary | ICD-10-CM | POA: Diagnosis not present

## 2022-08-08 DIAGNOSIS — I5032 Chronic diastolic (congestive) heart failure: Secondary | ICD-10-CM | POA: Insufficient documentation

## 2022-08-08 DIAGNOSIS — M349 Systemic sclerosis, unspecified: Secondary | ICD-10-CM | POA: Insufficient documentation

## 2022-08-08 DIAGNOSIS — J9610 Chronic respiratory failure, unspecified whether with hypoxia or hypercapnia: Secondary | ICD-10-CM | POA: Diagnosis not present

## 2022-08-08 DIAGNOSIS — I071 Rheumatic tricuspid insufficiency: Secondary | ICD-10-CM | POA: Diagnosis not present

## 2022-08-08 DIAGNOSIS — I7300 Raynaud's syndrome without gangrene: Secondary | ICD-10-CM | POA: Insufficient documentation

## 2022-08-08 DIAGNOSIS — I2721 Secondary pulmonary arterial hypertension: Secondary | ICD-10-CM | POA: Diagnosis not present

## 2022-08-08 DIAGNOSIS — G4733 Obstructive sleep apnea (adult) (pediatric): Secondary | ICD-10-CM | POA: Diagnosis not present

## 2022-08-08 LAB — CBC
HCT: 37.7 % (ref 36.0–46.0)
Hemoglobin: 12.3 g/dL (ref 12.0–15.0)
MCH: 30.3 pg (ref 26.0–34.0)
MCHC: 32.6 g/dL (ref 30.0–36.0)
MCV: 92.9 fL (ref 80.0–100.0)
Platelets: 227 10*3/uL (ref 150–400)
RBC: 4.06 MIL/uL (ref 3.87–5.11)
RDW: 13.2 % (ref 11.5–15.5)
WBC: 6.4 10*3/uL (ref 4.0–10.5)
nRBC: 0 % (ref 0.0–0.2)

## 2022-08-08 LAB — COMPREHENSIVE METABOLIC PANEL
ALT: 16 U/L (ref 0–44)
AST: 22 U/L (ref 15–41)
Albumin: 3.7 g/dL (ref 3.5–5.0)
Alkaline Phosphatase: 86 U/L (ref 38–126)
Anion gap: 7 (ref 5–15)
BUN: 10 mg/dL (ref 6–20)
CO2: 23 mmol/L (ref 22–32)
Calcium: 9.1 mg/dL (ref 8.9–10.3)
Chloride: 110 mmol/L (ref 98–111)
Creatinine, Ser: 0.95 mg/dL (ref 0.44–1.00)
GFR, Estimated: >60 mL/min (ref >60)
Glucose, Bld: 82 mg/dL (ref 70–99)
Potassium: 4.1 mmol/L (ref 3.5–5.1)
Sodium: 140 mmol/L (ref 135–145)
Total Bilirubin: 0.2 mg/dL — ABNORMAL LOW (ref 0.3–1.2)
Total Protein: 6.3 g/dL — ABNORMAL LOW (ref 6.5–8.1)

## 2022-08-08 LAB — ECHOCARDIOGRAM COMPLETE
AR max vel: 3.02 cm2
AV Area VTI: 3.11 cm2
AV Area mean vel: 2.96 cm2
AV Mean grad: 3 mmHg
AV Peak grad: 6.2 mmHg
Ao pk vel: 1.24 m/s
Area-P 1/2: 3.13 cm2
S' Lateral: 2.8 cm

## 2022-08-08 LAB — BRAIN NATRIURETIC PEPTIDE: B Natriuretic Peptide: 128 pg/mL — ABNORMAL HIGH (ref 0.0–100.0)

## 2022-08-08 NOTE — Progress Notes (Signed)
*  PRELIMINARY RESULTS* Echocardiogram 2D Echocardiogram has been performed.  Joanette Gula Hermena Swint 08/08/2022, 3:02 PM

## 2022-08-08 NOTE — Addendum Note (Signed)
Encounter addended by: Linda Hedges, RN on: 08/08/2022 4:26 PM  Actions taken: Charge Capture section accepted, Pend clinical note, Clinical Note Signed

## 2022-08-08 NOTE — Progress Notes (Addendum)
Advanced Heart Failure Clinic Note   Patient ID: Rachael Camacho, female   DOB: 1966-03-02, 56 y.o.   MRN: [id number] PCP: Primary Cardiologist: Dr Jens Som.  Primary HF: Dr Gala Romney Pulmonary: Dr Vassie Loll.  Rheumatologist: Dr. Kathi Ludwig [facility]   HPI: Rachael Camacho is a 56 year old with a history of  OSA, chornic diastolic HF, obesity, scleroderma, and Raynauds disease referred to HF clinic by Dr Vassie Loll.   Echo 7/21 EF 60-65% RV normal. TR jet inadequate to assess PA pressures.   Remains on macitenan 10 mg daily.   She returns for PAH/HF follow up. She hs not been seen since 2022. Overall feeling fine. Gets short of breath walking outside. Denies PND/Orthopnea. Occasionally dizzy especially when she bends down. Says it doesn't last long. Having some heart burn.  Appetite ok. No fever or chills. She has not been weighing daily. Using CPAP nightly. She has not had lasix over the 3 days due to travel. Taking all other medications. Working full time.    Studies/Tests  2021 RHC  RA = 6 RV = 40/5 PA = 39/14 (25) PCW = 10 Fick cardiac output/index = 6.4/3.2 PVR = 2.2 WU FA sat = 99% PA sat = 76%, 77% SVG sat = 69%  PFTs 5/21 FEV1 2.59 (99%) FVC 3.17 (95%) DLCO 53%  ONOX: 7/21 1hr 28 min with sats < 88% (lowest 76%)   01/2015- 93% at rest but non walking she desaturate to 84%   Echo 11/12/14 showed grade 2 diastolic dysfunction, with normal systolic function Echo  01/29/17  EF 60% normal RV no PAH Echo 2021  EF 60-65% RV normal. TR jet inadequate to assess PA pressures. Personally reviewed  CT angio 11/2014 was negative for pulmonary embolism, but showed mosaic pattern suggestive of early interstitial edema >> no response to Lasix CT angio 12/16: No ILD  VQ 7/16: No PE  Home sleep study 02/22/15.   Consistent with mild sleep apnea Desaturation noted.   RHC 02/2017: RA = 7 RV = 62/11 PA = 61/23 (39) PCW = 8 Fick cardiac output/index = 5.8/3.0 PVR = 5.3 WU Ao sat =  98% PA sat = 74%, 73% Mild to moderate PAH in setting of scleroderma.  Plan/Discussion: Start Macitentan 10mg  daily.  PFTs 03/09/15 no obstruction, mild restriction TLC 78%, DLCO 52% predicted  PFTs  11/16 FEV1 2.75L FVC   3.17L DLCO 52%  PFTs 01/2017: FEV1: 2.83 FVC: 3.42 DLCO: 60%  PFTs 04/2020: FEV1: 2.59 FVC: 3.17 DLCO: 53%  RHC 12/16 RA = 7 RV = 42/2/8 PA = 44/16 (28) PCW = 9 Fick cardiac output/index = 6.9/3.6 PVR = 2.8 WU Ao sat = 95% PA sat = 73% 76%  RHC [facility] 03/14/2015  RA 13/12 mean 10 mm Hg RV 53/11 mm Hg PA 50/19 mean 33 mm Hg  PCWP 18/16 mean 14 mm Hg LV 169/13/22 mm Hg  AO 165/92 mean 119 mm Hg Oxygen saturations: PA 73% AO 95% Cardiac Output (Fick) 6.9 L/min  Cardiac Index (Fick) 3.2 L/min/meter squared PVR = 2.8 WU            No significant CAD     Review of systems complete and found to be negative unless listed in HPI.   SH:  Social History   Socioeconomic History   Marital status: Married    Spouse name: W Javae Braaten   Number of children: 2   Years of education: Not on file   Highest education level: Not  on file  Occupational History   Occupation: Facilities manager    Comment: Office Work  Tobacco Use   Smoking status: Never   Smokeless tobacco: Never  Vaping Use   Vaping Use: Never used  Substance and Sexual Activity   Alcohol use: Yes    Alcohol/week: 0.0 standard drinks of alcohol    Comment: seldom   Drug use: No   Sexual activity: Not on file  Other Topics Concern   Not on file  Social History Narrative   Lives at home with her husband   Right handed   Social Determinants of Health   Financial Resource Strain: Not on file  Food Insecurity: Not on file  Transportation Needs: Not on file  Physical Activity: Not on file  Stress: Not on file  Social Connections: Not on file  Intimate Partner Violence: Not on file    FH:  Family History  Problem Relation Age of Onset   Heart failure  Mother        Stent put in 2014 for blockage   COPD Mother    Hypertension Mother    Hyperlipidemia Mother    Heart disease Mother    Thyroid disease Mother    Cancer Mother    Depression Mother    Anxiety disorder Mother    Heart disease Father    Heart disease Son        Repair ASD   Neuropathy Neg Hx     Past Medical History:  Diagnosis Date   Anemia    as child in first grade   Anxiety    Arthritis    Back pain    Constipation    CREST syndrome (HCC)    Dyspnea    Family history of adverse reaction to anesthesia    mom has had problems in past - N/V post op on one occasion; difficult time waking up on another occasion   Gallbladder problem    GERD (gastroesophageal reflux disease)    Gestational diabetes mellitus in childbirth, diet controlled 1987   IBS (irritable bowel syndrome)    Joint pain    Leg edema    Menopause    Osteoarthritis (arthritis due to wear and tear of joints)    PAH (pulmonary artery hypertension) (HCC)    Pulmonary fibrosis (HCC)    Raynaud disease    hands-toes   Scleroderma (HCC)    Shortness of breath dyspnea    pulmonary fibrosis-scleraderma   Sleep apnea    Swallowing difficulty    Thyroid cyst    Vitamin D deficiency    Wears glasses     Current Outpatient Medications  Medication Sig Dispense Refill   baclofen (LIORESAL) 10 MG tablet TAKE 1 TABLET AT BEDTIME 90 tablet 3   buPROPion (WELLBUTRIN XL) 150 MG 24 hr tablet Take 150 mg by mouth daily.     desloratadine (CLARINEX) 5 MG tablet Take 5 mg by mouth at bedtime.      diclofenac sodium (VOLTAREN) 1 % GEL Apply 2-4 g topically 4 (four) times daily as needed (for knee pain.).     fluticasone (FLONASE) 50 MCG/ACT nasal spray Place 1 spray into both nostrils daily as needed for allergies or rhinitis.      furosemide (LASIX) 40 MG tablet Take 40 mg by mouth daily.     gabapentin (NEURONTIN) 100 MG capsule TAKE 1 CAPSULE AT BEDTIME 90 capsule 3   levothyroxine (SYNTHROID) 50 MCG  tablet Take 50 mcg by  mouth daily before breakfast.     macitentan (OPSUMIT) 10 MG tablet TAKE 1 TABLET BY MOUTH DAILY. DO NOT HANDLE IF PREGNANT. DO NOT SPLIT, CRUSH, OR CHEW. REVIEW MEDICATION GUIDE. CALL [phone] FOR REFILLS. 30 tablet 11   tirzepatide (MOUNJARO) 2.5 MG/0.5ML Pen Inject 2.5 mg into the skin once a week.     TRINTELLIX 5 MG TABS tablet Take 5 mg by mouth daily.     No current facility-administered medications for this encounter.    Vitals:   08/08/22 1508  BP: 120/72  Pulse: (!) 58  SpO2: 95%  Weight: 97.5 kg (215 lb)     Wt Readings from Last 3 Encounters:  08/08/22 97.5 kg (215 lb)  02/06/21 93.6 kg (206 lb 6.4 oz)  10/20/20 92.5 kg (204 lb)    PHYSICAL EXAM: General:  Well appearing. No resp difficulty HEENT: normal Neck: supple. JVP 9-10  Carotids 2+ bilat; no bruits. No lymphadenopathy or thryomegaly appreciated. Cor: PMI nondisplaced. Regular rate & rhythm. No rubs, gallops or murmurs. Lungs: clear Abdomen: obese, soft, nontender, nondistended. No hepatosplenomegaly. No bruits or masses. Good bowel sounds. Extremities: no cyanosis, clubbing, rash, R and LLE trace edema Neuro: alert & orientedx3, cranial nerves grossly intact. moves all 4 extremities w/o difficulty. Affect pleasant  EKG: SR 57 bpm    ASSESSMENT & PLAN:  1. Pulmonary HTN - mild PAH in setting of scleroderma. - RHC 02/2017: PA 61/23 (39), PCW 8. - RHC 2021: PA = 39/14 (25) PCW 10  PVR 2.2  - Echo 2021  EF 60-65% Diastolic function felt to be normal. RV normal  - Had RHC 2021 39/14 (25) PVR 2.2  - NYHA III - Echo today with RVSP 50-60 - Continue opsimut 10 mg daily -Echo today discussed and reviewed by Dr Daegon Deiss   2. Chronic Diastolic HF - Echo 2018: EF 60-65%, RV normal - Echo 2021  EF 60-65% Diastolic function felt to be normal. RV normal  - Echo today EF 60-65%. RV normal. RVSP 50s. Discussed and reviewed by Dr Nettie Wyffels.  - NYHA III. Volume status trending up. Needs  to take lasix today.  - Continue lasix 20 mg daily  3. OSA  - Using CPAP nightly.  - follow with Dr. Alva   4. Scleroderma - stable No recent flares  5. Chronic respiratory failure with exertional desaturations - Follows with Dr Alva - now with nighttime O2 but not wearing it  - PFTs stable   6. 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 I personally reviewed laboratory data, imaging studies and relevant notes. I independently examined the patient and formulated the important aspects of the plan. I have edited the note to reflect any of my changes or salient points. I have personally discussed the plan with the patient and/or family.  Here for f/u of PAH. She is on macitentan 10mg. She has been struggling with progressive DOE. Husband often tells her that her lips are blue. Mild edema. Continues to wear CPAP  Echo today 55-60% RV normal. Mild TR RVSP ~60mmHG  General:  Obese woman. No resp difficulty HEENT: normal + telengectacias  Neck: supple. no JVD. Carotids 2+ bilat; no bruits. No lymphadenopathy or thryomegaly appreciated. Cor: PMI nondisplaced. Regular rate & rhythm. No rubs, gallops or murmurs. Lungs: clear Abdomen: obese soft, nontender,   nondistended. No hepatosplenomegaly. No bruits or masses. Good bowel sounds. Extremities: no cyanosis, clubbing, rash, edema Neuro: alert & orientedx3, cranial nerves grossly intact. moves all 4 extremities w/o difficulty. Affect pleasant  Progressive NYHA III symptoms. Echo with normal RV function but evidence of worsening PH. Will need repeat RHC.  Check 6MW to look for exertional desaturations.   Pranika Finks, MD  4:08 PM     

## 2022-08-08 NOTE — Addendum Note (Signed)
Encounter addended by: Linda Hedges, RN on: 08/08/2022 4:17 PM  Actions taken: Order list changed, Diagnosis association updated

## 2022-08-08 NOTE — Patient Instructions (Signed)
There has been no changes to your medications.  Labs done today, your results will be available in MyChart, we will contact you for abnormal readings.  You are scheduled for a Cardiac Catheterization on Monday, August 21 with Dr. Arvilla Meres.  1. Please arrive at the Main Entrance A at [facility]: [address] [name], Rachael Camacho at 5:30 AM (This time is two hours before your procedure to ensure your preparation). Free valet parking service is available.   Special note: Every effort is made to have your procedure done on time. Please understand that emergencies sometimes delay scheduled procedures.  2. Diet: Do not eat solid foods after midnight.  You may have clear liquids until 5 AM upon the day of the procedure.  4. Medication instructions in preparation for your procedure:   Contrast Allergy: No  Stop taking, Lasix (Furosemide)  Monday, August 21,  On the morning of your procedure, take any morning medicines NOT listed above.  You may use sips of water.  5. Plan to go home the same day, you will only stay overnight if medically necessary. 6. You MUST have a responsible adult to drive you home. 7. An adult MUST be with you the first 24 hours after you arrive home. 8. Bring a current list of your medications, and the last time and date medication taken. 9. Bring ID and current insurance cards. 10.Please wear clothes that are easy to get on and off and wear slip-on shoes.  Thank you for allowing [place] to care for you!   -- [facility]  Your physician recommends that you schedule a follow-up appointment in: 6 months ( February 2024)  ** please call the office in November to arrange your follow up appointment **  If you have any questions or concerns before your next appointment please send [place] a message through [place] or call our office at [phone].    TO LEAVE A MESSAGE FOR THE NURSE SELECT OPTION 2, PLEASE LEAVE A MESSAGE  INCLUDING: YOUR NAME DATE OF BIRTH CALL BACK NUMBER REASON FOR CALL**this is important as we prioritize the call backs  YOU WILL RECEIVE A CALL BACK THE SAME DAY AS LONG AS YOU CALL BEFORE 4:00 PM  At the Advanced Heart Failure Clinic, you and your health needs are our priority. As part of our continuing mission to provide you with exceptional heart care, we have created designated Provider Care Teams. These Care Teams include your primary Cardiologist (physician) and Advanced Practice Providers (APPs- Physician Assistants and Nurse Practitioners) who all work together to provide you with the care you need, when you need it.   You may see any of the following providers on your designated Care Team at your next follow up: Dr Arvilla Meres Dr Carron Curie, NP Robbie Lis, [place] [facility] [place], [place] Karle Plumber, PharmD   Please be sure to bring in all your medications bottles to every appointment.

## 2022-08-08 NOTE — Progress Notes (Signed)
6 Min Walk Test Completed  Pt ambulated 457.2 meters O2 Sat ranged 100-87% on room air HR ranged 65-122

## 2022-08-08 NOTE — Addendum Note (Signed)
Encounter addended by: Suezanne Cheshire, RN on: 08/08/2022 4:36 PM  Actions taken: Clinical Note Signed

## 2022-08-08 NOTE — H&P (View-Only) (Signed)
Advanced Heart Failure Clinic Note   Patient ID: Rachael Camacho, female   DOB: 1966-03-02, 56 y.o.   MRN: [id number] PCP: Primary Cardiologist: Dr Jens Som.  Primary HF: Dr Gala Romney Pulmonary: Dr Vassie Loll.  Rheumatologist: Dr. Kathi Ludwig [facility]   HPI: Rachael Camacho is a 56 year old with a history of  OSA, chornic diastolic HF, obesity, scleroderma, and Raynauds disease referred to HF clinic by Dr Vassie Loll.   Echo 7/21 EF 60-65% RV normal. TR jet inadequate to assess PA pressures.   Remains on macitenan 10 mg daily.   She returns for PAH/HF follow up. She hs not been seen since 2022. Overall feeling fine. Gets short of breath walking outside. Denies PND/Orthopnea. Occasionally dizzy especially when she bends down. Says it doesn't last long. Having some heart burn.  Appetite ok. No fever or chills. She has not been weighing daily. Using CPAP nightly. She has not had lasix over the 3 days due to travel. Taking all other medications. Working full time.    Studies/Tests  2021 RHC  RA = 6 RV = 40/5 PA = 39/14 (25) PCW = 10 Fick cardiac output/index = 6.4/3.2 PVR = 2.2 WU FA sat = 99% PA sat = 76%, 77% SVG sat = 69%  PFTs 5/21 FEV1 2.59 (99%) FVC 3.17 (95%) DLCO 53%  ONOX: 7/21 1hr 28 min with sats < 88% (lowest 76%)   01/2015- 93% at rest but non walking she desaturate to 84%   Echo 11/12/14 showed grade 2 diastolic dysfunction, with normal systolic function Echo  01/29/17  EF 60% normal RV no PAH Echo 2021  EF 60-65% RV normal. TR jet inadequate to assess PA pressures. Personally reviewed  CT angio 11/2014 was negative for pulmonary embolism, but showed mosaic pattern suggestive of early interstitial edema >> no response to Lasix CT angio 12/16: No ILD  VQ 7/16: No PE  Home sleep study 02/22/15.   Consistent with mild sleep apnea Desaturation noted.   RHC 02/2017: RA = 7 RV = 62/11 PA = 61/23 (39) PCW = 8 Fick cardiac output/index = 5.8/3.0 PVR = 5.3 WU Ao sat =  98% PA sat = 74%, 73% Mild to moderate PAH in setting of scleroderma.  Plan/Discussion: Start Macitentan 10mg  daily.  PFTs 03/09/15 no obstruction, mild restriction TLC 78%, DLCO 52% predicted  PFTs  11/16 FEV1 2.75L FVC   3.17L DLCO 52%  PFTs 01/2017: FEV1: 2.83 FVC: 3.42 DLCO: 60%  PFTs 04/2020: FEV1: 2.59 FVC: 3.17 DLCO: 53%  RHC 12/16 RA = 7 RV = 42/2/8 PA = 44/16 (28) PCW = 9 Fick cardiac output/index = 6.9/3.6 PVR = 2.8 WU Ao sat = 95% PA sat = 73% 76%  RHC [facility] 03/14/2015  RA 13/12 mean 10 mm Hg RV 53/11 mm Hg PA 50/19 mean 33 mm Hg  PCWP 18/16 mean 14 mm Hg LV 169/13/22 mm Hg  AO 165/92 mean 119 mm Hg Oxygen saturations: PA 73% AO 95% Cardiac Output (Fick) 6.9 L/min  Cardiac Index (Fick) 3.2 L/min/meter squared PVR = 2.8 WU            No significant CAD     Review of systems complete and found to be negative unless listed in HPI.   SH:  Social History   Socioeconomic History   Marital status: Married    Spouse name: Rachael Camacho   Number of children: 2   Years of education: Not on file   Highest education level: Not  on file  Occupational History   Occupation: Facilities manager    Comment: Office Work  Tobacco Use   Smoking status: Never   Smokeless tobacco: Never  Vaping Use   Vaping Use: Never used  Substance and Sexual Activity   Alcohol use: Yes    Alcohol/week: 0.0 standard drinks of alcohol    Comment: seldom   Drug use: No   Sexual activity: Not on file  Other Topics Concern   Not on file  Social History Narrative   Lives at home with her husband   Right handed   Social Determinants of Health   Financial Resource Strain: Not on file  Food Insecurity: Not on file  Transportation Needs: Not on file  Physical Activity: Not on file  Stress: Not on file  Social Connections: Not on file  Intimate Partner Violence: Not on file    FH:  Family History  Problem Relation Age of Onset   Heart failure  Mother        Stent put in 2014 for blockage   COPD Mother    Hypertension Mother    Hyperlipidemia Mother    Heart disease Mother    Thyroid disease Mother    Cancer Mother    Depression Mother    Anxiety disorder Mother    Heart disease Father    Heart disease Son        Repair ASD   Neuropathy Neg Hx     Past Medical History:  Diagnosis Date   Anemia    as child in first grade   Anxiety    Arthritis    Back pain    Constipation    CREST syndrome (HCC)    Dyspnea    Family history of adverse reaction to anesthesia    mom has had problems in past - N/V post op on one occasion; difficult time waking up on another occasion   Gallbladder problem    GERD (gastroesophageal reflux disease)    Gestational diabetes mellitus in childbirth, diet controlled 1987   IBS (irritable bowel syndrome)    Joint pain    Leg edema    Menopause    Osteoarthritis (arthritis due to wear and tear of joints)    PAH (pulmonary artery hypertension) (HCC)    Pulmonary fibrosis (HCC)    Raynaud disease    hands-toes   Scleroderma (HCC)    Shortness of breath dyspnea    pulmonary fibrosis-scleraderma   Sleep apnea    Swallowing difficulty    Thyroid cyst    Vitamin D deficiency    Wears glasses     Current Outpatient Medications  Medication Sig Dispense Refill   baclofen (LIORESAL) 10 MG tablet TAKE 1 TABLET AT BEDTIME 90 tablet 3   buPROPion (WELLBUTRIN XL) 150 MG 24 hr tablet Take 150 mg by mouth daily.     desloratadine (CLARINEX) 5 MG tablet Take 5 mg by mouth at bedtime.      diclofenac sodium (VOLTAREN) 1 % GEL Apply 2-4 g topically 4 (four) times daily as needed (for knee pain.).     fluticasone (FLONASE) 50 MCG/ACT nasal spray Place 1 spray into both nostrils daily as needed for allergies or rhinitis.      furosemide (LASIX) 40 MG tablet Take 40 mg by mouth daily.     gabapentin (NEURONTIN) 100 MG capsule TAKE 1 CAPSULE AT BEDTIME 90 capsule 3   levothyroxine (SYNTHROID) 50 MCG  tablet Take 50 mcg by  mouth daily before breakfast.     macitentan (OPSUMIT) 10 MG tablet TAKE 1 TABLET BY MOUTH DAILY. DO NOT HANDLE IF PREGNANT. DO NOT SPLIT, CRUSH, OR CHEW. REVIEW MEDICATION GUIDE. CALL [phone] FOR REFILLS. 30 tablet 11   tirzepatide (MOUNJARO) 2.5 MG/0.5ML Pen Inject 2.5 mg into the skin once a week.     TRINTELLIX 5 MG TABS tablet Take 5 mg by mouth daily.     No current facility-administered medications for this encounter.    Vitals:   08/08/22 1508  BP: 120/72  Pulse: (!) 58  SpO2: 95%  Weight: 97.5 kg (215 lb)     Wt Readings from Last 3 Encounters:  08/08/22 97.5 kg (215 lb)  02/06/21 93.6 kg (206 lb 6.4 oz)  10/20/20 92.5 kg (204 lb)    PHYSICAL EXAM: General:  Well appearing. No resp difficulty HEENT: normal Neck: supple. JVP 9-10  Carotids 2+ bilat; no bruits. No lymphadenopathy or thryomegaly appreciated. Cor: PMI nondisplaced. Regular rate & rhythm. No rubs, gallops or murmurs. Lungs: clear Abdomen: obese, soft, nontender, nondistended. No hepatosplenomegaly. No bruits or masses. Good bowel sounds. Extremities: no cyanosis, clubbing, rash, R and LLE trace edema Neuro: alert & orientedx3, cranial nerves grossly intact. moves all 4 extremities Rachael/o difficulty. Affect pleasant  EKG: SR 57 bpm    ASSESSMENT & PLAN:  1. Pulmonary HTN - mild PAH in setting of scleroderma. - RHC 02/2017: PA 61/23 (39), PCW 8. - RHC 2021: PA = 39/14 (25) PCW 10  PVR 2.2  - Echo 2021  EF 123456 Diastolic function felt to be normal. RV normal  - Had RHC 2021 39/14 (25) PVR 2.2  - NYHA III - Echo today with RVSP 50-60 - Continue opsimut 10 mg daily -Echo today discussed and reviewed by Dr Haroldine Laws   2. Chronic Diastolic HF - Echo 99991111: EF 60-65%, RV normal - Echo 2021  EF 123456 Diastolic function felt to be normal. RV normal  - Echo today EF 60-65%. RV normal. RVSP 50s. Discussed and reviewed by Dr Haroldine Laws.  - NYHA III. Volume status trending up. Needs  to take lasix today.  - Continue lasix 20 mg daily  3. OSA  - Using CPAP nightly.  - follow with Dr. Elsworth Soho   4. Scleroderma - stable No recent flares  5. Chronic respiratory failure with exertional desaturations - Follows with Dr Elsworth Soho - now with nighttime O2 but not wearing it  - PFTs stable   6. 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 I personally reviewed laboratory data, imaging studies and relevant notes. I independently examined the patient and formulated the important aspects of the plan. I have edited the note to reflect any of my changes or salient points. I have personally discussed the plan with the patient and/or family.  Here for f/u of PAH. She is on macitentan 10mg . She has been struggling with progressive DOE. Husband often tells her that her lips are blue. Mild edema. Continues to wear CPAP  Echo today 55-60% RV normal. Mild TR RVSP ~27mmHG  General:  Obese woman. No resp difficulty HEENT: normal + telengectacias  Neck: supple. no JVD. Carotids 2+ bilat; no bruits. No lymphadenopathy or thryomegaly appreciated. Cor: PMI nondisplaced. Regular rate & rhythm. No rubs, gallops or murmurs. Lungs: clear Abdomen: obese soft, nontender,  nondistended. No hepatosplenomegaly. No bruits or masses. Good bowel sounds. Extremities: no cyanosis, clubbing, rash, edema Neuro: alert & orientedx3, cranial nerves grossly intact. moves all 4 extremities Rachael/o difficulty. Affect pleasant  Progressive NYHA III symptoms. Echo with normal RV function but evidence of worsening PH. Will need repeat RHC.  Check 6MW to look for exertional desaturations.   Glori Bickers, MD  4:08 PM

## 2022-08-13 ENCOUNTER — Encounter (HOSPITAL_COMMUNITY)
Admission: RE | Disposition: A | Discharge status: Home / Self Care | Payer: Self-pay | Source: Home / Self Care | Attending: Internal Medicine

## 2022-08-13 ENCOUNTER — Encounter (HOSPITAL_COMMUNITY): Payer: Self-pay | Admitting: Internal Medicine

## 2022-08-13 ENCOUNTER — Ambulatory Visit (HOSPITAL_COMMUNITY)
Admission: RE | Admit: 2022-08-13 | Discharge: 2022-08-13 | Disposition: A | Discharge status: Home / Self Care | Payer: BC Managed Care – PPO | Attending: Internal Medicine | Admitting: Internal Medicine

## 2022-08-13 ENCOUNTER — Other Ambulatory Visit: Payer: Self-pay

## 2022-08-13 DIAGNOSIS — G4733 Obstructive sleep apnea (adult) (pediatric): Secondary | ICD-10-CM | POA: Insufficient documentation

## 2022-08-13 DIAGNOSIS — J9610 Chronic respiratory failure, unspecified whether with hypoxia or hypercapnia: Secondary | ICD-10-CM | POA: Insufficient documentation

## 2022-08-13 DIAGNOSIS — I5032 Chronic diastolic (congestive) heart failure: Secondary | ICD-10-CM | POA: Insufficient documentation

## 2022-08-13 DIAGNOSIS — I2721 Secondary pulmonary arterial hypertension: Secondary | ICD-10-CM

## 2022-08-13 DIAGNOSIS — Z79899 Other long term (current) drug therapy: Secondary | ICD-10-CM | POA: Insufficient documentation

## 2022-08-13 DIAGNOSIS — I272 Pulmonary hypertension, unspecified: Secondary | ICD-10-CM

## 2022-08-13 DIAGNOSIS — Z6835 Body mass index (BMI) 35.0-35.9, adult: Secondary | ICD-10-CM | POA: Insufficient documentation

## 2022-08-13 DIAGNOSIS — I7300 Raynaud's syndrome without gangrene: Secondary | ICD-10-CM | POA: Diagnosis not present

## 2022-08-13 DIAGNOSIS — E669 Obesity, unspecified: Secondary | ICD-10-CM | POA: Diagnosis not present

## 2022-08-13 DIAGNOSIS — M349 Systemic sclerosis, unspecified: Secondary | ICD-10-CM | POA: Insufficient documentation

## 2022-08-13 HISTORY — PX: RIGHT HEART CATH: CATH118263

## 2022-08-13 LAB — POCT I-STAT EG7
Acid-Base Excess: 0 mmol/L (ref 0.0–2.0)
Acid-Base Excess: 0 mmol/L (ref 0.0–2.0)
Acid-Base Excess: 1 mmol/L (ref 0.0–2.0)
Acid-base deficit: 1 mmol/L (ref 0.0–2.0)
Acid-base deficit: 1 mmol/L (ref 0.0–2.0)
Acid-base deficit: 3 mmol/L — ABNORMAL HIGH (ref 0.0–2.0)
Acid-base deficit: 3 mmol/L — ABNORMAL HIGH (ref 0.0–2.0)
Bicarbonate: 21.2 mmol/L (ref 20.0–28.0)
Bicarbonate: 21.6 mmol/L (ref 20.0–28.0)
Bicarbonate: 23.8 mmol/L (ref 20.0–28.0)
Bicarbonate: 23.9 mmol/L (ref 20.0–28.0)
Bicarbonate: 24.6 mmol/L (ref 20.0–28.0)
Bicarbonate: 24.7 mmol/L (ref 20.0–28.0)
Bicarbonate: 25.4 mmol/L (ref 20.0–28.0)
Calcium, Ion: 0.86 mmol/L — CL (ref 1.15–1.40)
Calcium, Ion: 0.92 mmol/L — ABNORMAL LOW (ref 1.15–1.40)
Calcium, Ion: 1.02 mmol/L — ABNORMAL LOW (ref 1.15–1.40)
Calcium, Ion: 1.13 mmol/L — ABNORMAL LOW (ref 1.15–1.40)
Calcium, Ion: 1.15 mmol/L (ref 1.15–1.40)
Calcium, Ion: 1.21 mmol/L (ref 1.15–1.40)
Calcium, Ion: 1.24 mmol/L (ref 1.15–1.40)
HCT: 31 % — ABNORMAL LOW (ref 36.0–46.0)
HCT: 32 % — ABNORMAL LOW (ref 36.0–46.0)
HCT: 34 % — ABNORMAL LOW (ref 36.0–46.0)
HCT: 35 % — ABNORMAL LOW (ref 36.0–46.0)
HCT: 35 % — ABNORMAL LOW (ref 36.0–46.0)
HCT: 36 % (ref 36.0–46.0)
HCT: 37 % (ref 36.0–46.0)
Hemoglobin: 10.5 g/dL — ABNORMAL LOW (ref 12.0–15.0)
Hemoglobin: 10.9 g/dL — ABNORMAL LOW (ref 12.0–15.0)
Hemoglobin: 11.6 g/dL — ABNORMAL LOW (ref 12.0–15.0)
Hemoglobin: 11.9 g/dL — ABNORMAL LOW (ref 12.0–15.0)
Hemoglobin: 11.9 g/dL — ABNORMAL LOW (ref 12.0–15.0)
Hemoglobin: 12.2 g/dL (ref 12.0–15.0)
Hemoglobin: 12.6 g/dL (ref 12.0–15.0)
O2 Saturation: 65 %
O2 Saturation: 68 %
O2 Saturation: 70 %
O2 Saturation: 74 %
O2 Saturation: 74 %
O2 Saturation: 76 %
O2 Saturation: 79 %
Potassium: 2.6 mmol/L — CL (ref 3.5–5.1)
Potassium: 2.6 mmol/L — CL (ref 3.5–5.1)
Potassium: 2.9 mmol/L — ABNORMAL LOW (ref 3.5–5.1)
Potassium: 3.1 mmol/L — ABNORMAL LOW (ref 3.5–5.1)
Potassium: 3.1 mmol/L — ABNORMAL LOW (ref 3.5–5.1)
Potassium: 3.1 mmol/L — ABNORMAL LOW (ref 3.5–5.1)
Potassium: 3.3 mmol/L — ABNORMAL LOW (ref 3.5–5.1)
Sodium: 142 mmol/L (ref 135–145)
Sodium: 144 mmol/L (ref 135–145)
Sodium: 144 mmol/L (ref 135–145)
Sodium: 144 mmol/L (ref 135–145)
Sodium: 146 mmol/L — ABNORMAL HIGH (ref 135–145)
Sodium: 148 mmol/L — ABNORMAL HIGH (ref 135–145)
Sodium: 150 mmol/L — ABNORMAL HIGH (ref 135–145)
TCO2: 22 mmol/L (ref 22–32)
TCO2: 23 mmol/L (ref 22–32)
TCO2: 25 mmol/L (ref 22–32)
TCO2: 25 mmol/L (ref 22–32)
TCO2: 26 mmol/L (ref 22–32)
TCO2: 26 mmol/L (ref 22–32)
TCO2: 27 mmol/L (ref 22–32)
pCO2, Ven: 34.9 mmHg — ABNORMAL LOW (ref 44–60)
pCO2, Ven: 36.9 mmHg — ABNORMAL LOW (ref 44–60)
pCO2, Ven: 38.4 mmHg — ABNORMAL LOW (ref 44–60)
pCO2, Ven: 39.5 mmHg — ABNORMAL LOW (ref 44–60)
pCO2, Ven: 40.2 mmHg — ABNORMAL LOW (ref 44–60)
pCO2, Ven: 40.6 mmHg — ABNORMAL LOW (ref 44–60)
pCO2, Ven: 40.6 mmHg — ABNORMAL LOW (ref 44–60)
pH, Ven: 7.375 (ref 7.25–7.43)
pH, Ven: 7.378 (ref 7.25–7.43)
pH, Ven: 7.392 (ref 7.25–7.43)
pH, Ven: 7.393 (ref 7.25–7.43)
pH, Ven: 7.4 (ref 7.25–7.43)
pH, Ven: 7.402 (ref 7.25–7.43)
pH, Ven: 7.409 (ref 7.25–7.43)
pO2, Ven: 34 mmHg (ref 32–45)
pO2, Ven: 36 mmHg (ref 32–45)
pO2, Ven: 37 mmHg (ref 32–45)
pO2, Ven: 39 mmHg (ref 32–45)
pO2, Ven: 39 mmHg (ref 32–45)
pO2, Ven: 41 mmHg (ref 32–45)
pO2, Ven: 43 mmHg (ref 32–45)

## 2022-08-13 SURGERY — RIGHT HEART CATH
Anesthesia: LOCAL

## 2022-08-13 MED ORDER — ACETAMINOPHEN 325 MG PO TABS: 650.0000 mg | ORAL_TABLET | ORAL | Status: DC | PRN

## 2022-08-13 MED ORDER — SODIUM CHLORIDE 0.9 % IV SOLN: INTRAVENOUS | Status: DC

## 2022-08-13 MED ORDER — LIDOCAINE HCL (PF) 1 % IJ SOLN
INTRAMUSCULAR | Status: AC
  Filled 2022-08-13: qty 30

## 2022-08-13 MED ORDER — SODIUM CHLORIDE 0.9 % IV SOLN: 250.0000 mL | INTRAVENOUS | Status: DC | PRN

## 2022-08-13 MED ORDER — HEPARIN (PORCINE) IN NACL 1000-0.9 UT/500ML-% IV SOLN
INTRAVENOUS | Status: AC
  Filled 2022-08-13: qty 500

## 2022-08-13 MED ORDER — ONDANSETRON HCL 4 MG/2ML IJ SOLN: 4.0000 mg | Freq: Four times a day (QID) | INTRAMUSCULAR | Status: DC | PRN

## 2022-08-13 MED ORDER — HYDRALAZINE HCL 20 MG/ML IJ SOLN: 10.0000 mg | INTRAMUSCULAR | Status: DC | PRN

## 2022-08-13 MED ORDER — SODIUM CHLORIDE 0.9% FLUSH: 3.0000 mL | Freq: Two times a day (BID) | INTRAVENOUS | Status: DC

## 2022-08-13 MED ORDER — SODIUM CHLORIDE 0.9% FLUSH: 3.0000 mL | INTRAVENOUS | Status: DC | PRN

## 2022-08-13 MED ORDER — HEPARIN (PORCINE) IN NACL 1000-0.9 UT/500ML-% IV SOLN
INTRAVENOUS | Status: DC | PRN
  Administered 2022-08-13: 500 mL

## 2022-08-13 MED ORDER — LIDOCAINE HCL (PF) 1 % IJ SOLN
INTRAMUSCULAR | Status: DC | PRN
  Administered 2022-08-13: 1 mL

## 2022-08-13 MED ORDER — LABETALOL HCL 5 MG/ML IV SOLN: 10.0000 mg | INTRAVENOUS | Status: DC | PRN

## 2022-08-13 SURGICAL SUPPLY — 7 items
CATH BALLN WEDGE 5F 110CM (CATHETERS) IMPLANT
GUIDEWIRE .025 260CM (WIRE) IMPLANT
PACK CARDIAC CATHETERIZATION (CUSTOM PROCEDURE TRAY) ×2 IMPLANT
SHEATH GLIDE SLENDER 4/5FR (SHEATH) IMPLANT
TRANSDUCER W/STOPCOCK (MISCELLANEOUS) ×2 IMPLANT
TUBING ART PRESS 72  MALE/FEM (TUBING) ×1
TUBING ART PRESS 72 MALE/FEM (TUBING) IMPLANT

## 2022-08-13 NOTE — Interval H&P Note (Signed)
History and Physical Interval Note:  08/13/2022 7:44 AM  Rachael Camacho  has presented today for surgery, with the diagnosis of PAH.  The various methods of treatment have been discussed with the patient and family. After consideration of risks, benefits and other options for treatment, the patient has consented to  Procedure(s): RIGHT HEART CATH (N/A) as a surgical intervention.  The patient's history has been reviewed, patient examined, no change in status, stable for surgery.  I have reviewed the patient's chart and labs.  Questions were answered to the patient's satisfaction.     Rachael Camacho

## 2022-09-06 ENCOUNTER — Telehealth (HOSPITAL_COMMUNITY): Payer: Self-pay | Admitting: Cardiology

## 2022-09-06 DIAGNOSIS — I2721 Secondary pulmonary arterial hypertension: Secondary | ICD-10-CM

## 2022-09-06 NOTE — Telephone Encounter (Signed)
Per  cath results  Findings:   RA = 5 RV = 53/11 PA = 54/17 (35) PCW = 8 Fick cardiac output/index = 7.3/3.7 PVR = 3.7 WU Ao sat = 96% PA sat = 74%, 74% SVC sat = 68% (65%, 68%, 70%) RA = 79% RV = 76% Qp/Qs = 0.77   Assessment: 1. Mild PAH with elevated PVR 2. O2 step up between SVC and RA concerning for small L-> R shunt   Plan/Discussion:    PAH appears mixed due to high output in setting of a shunt and elevated PVR.   Will do echo with bubble study and consider CT.   Will likely add tadafil.    Arvilla Meres, MD  8:23 AM  -order for echo bubble study placed and pt aware pre cert then scheduler will be in contact to [place]

## 2022-09-11 ENCOUNTER — Telehealth (INDEPENDENT_AMBULATORY_CARE_PROVIDER_SITE_OTHER): Payer: BC Managed Care – PPO | Admitting: Adult Health

## 2022-09-11 DIAGNOSIS — G500 Trigeminal neuralgia: Secondary | ICD-10-CM | POA: Diagnosis not present

## 2022-09-11 MED ORDER — BACLOFEN 10 MG PO TABS
10.0000 mg | ORAL_TABLET | Freq: Every day | ORAL | 3 refills | Status: DC
Start: 2022-09-11 — End: 2023-02-06

## 2022-09-11 MED ORDER — GABAPENTIN 100 MG PO CAPS
100.0000 mg | ORAL_CAPSULE | Freq: Every day | ORAL | 3 refills | Status: DC
Start: 2022-09-11 — End: 2023-09-06

## 2022-09-11 NOTE — Progress Notes (Signed)
PATIENT: Rachael Camacho DOB: [birth year]  REASON FOR VISIT: follow up HISTORY FROM: patient  Virtual Visit via Video Note  I connected with Rachael Camacho on 09/11/22 at  1:45 PM EDT by a video enabled telemedicine application located remotely [facility] and verified that I am speaking with the correct person using two identifiers who was located at their own home.   I discussed the limitations of evaluation and management by telemedicine and the availability of in person appointments. The patient expressed understanding and agreed to proceed.   PATIENT: Rachael Camacho DOB: [birth year]  REASON FOR VISIT: follow up HISTORY FROM: patient  HISTORY OF PRESENT ILLNESS: Today 09/11/22:  Ms. Rachael Camacho is a 56 year old female with a history of trigeminal neuralgia on the right side.  She returns today for follow-up.  Overall she feels that she is doing well.  Baclofen and gabapentin continue to work well for her.  She really does not have any breakthrough symptoms.  She states that she can tell if she misses a dose.  Otherwise she is doing fairly well.  09/05/21: Ms. Rachael Camacho is a 56 year old female with a history of trigeminal neuralgia on the right side.  She returns today.  She continues on 5 mg of baclofen and 100 mg of gabapentin at bedtime.  She reports that this has been controlling her symptoms.  She states that typical triggers are when she yawns or if she bites on something hard.  Overall she feels her symptoms are well controlled.  She joins me today for virtual visit  HISTORY 08/25/20:   Ms. Rachael Camacho is a 56 year old female with a history of trigeminal neuralgia.  She reports that as long as she takes baclofen and gabapentin she has been doing well.  Reports that she has been out of her baclofen for several months.  She reports that discomfort in the right side of the face is slowly coming back.  She reports that on occasion if she yawns she feels pain.  She  states there are times she feels that she is getting an earache or if she bites down on food she will have an electric-like pain.  She denies any new symptoms.  Returns today for an evaluation.   REVIEW OF SYSTEMS: Out of a complete 14 system review of symptoms, the patient complains only of the following symptoms, and all other reviewed systems are negative.  ALLERGIES: Allergies  Allergen Reactions   Amoxicillin Other (See Comments)    Immediate yeast infection  Has patient had a PCN reaction causing immediate rash, facial/tongue/throat swelling, SOB or lightheadedness with hypotension: No Has patient had a PCN reaction causing severe rash involving mucus membranes or skin necrosis: No Has patient had a PCN reaction that required hospitalization: No Has patient had a PCN reaction occurring within the last 10 years: No If all of the above answers are "NO", then may proceed with Cephalosporin use.    Percocet [Oxycodone-Acetaminophen] Itching    Tolerates acetaminophen alone   [name] [Nitrofurantoin Monohyd Macro] Rash and Hives   [name] Hives and Rash    Reported chicken pox-like rash, not hives   Sulfa Antibiotics Rash and Hives    HOME MEDICATIONS: Outpatient Medications Prior to Visit  Medication Sig Dispense Refill   baclofen (LIORESAL) 10 MG tablet TAKE 1 TABLET AT BEDTIME 90 tablet 3   buPROPion (WELLBUTRIN XL) 150 MG 24 hr tablet Take 150 mg by mouth daily.  desloratadine (CLARINEX) 5 MG tablet Take 5 mg by mouth at bedtime.      diclofenac sodium (VOLTAREN) 1 % GEL Apply 2-4 g topically 4 (four) times daily as needed (for knee pain.).     fluticasone (FLONASE) 50 MCG/ACT nasal spray Place 1 spray into both nostrils daily as needed for allergies or rhinitis.      furosemide (LASIX) 40 MG tablet Take 40 mg by mouth daily.     gabapentin (NEURONTIN) 100 MG capsule TAKE 1 CAPSULE AT BEDTIME 90 capsule 3   levothyroxine (SYNTHROID) 50 MCG tablet Take 50 mcg by mouth  daily before breakfast.     macitentan (OPSUMIT) 10 MG tablet TAKE 1 TABLET BY MOUTH DAILY. DO NOT HANDLE IF PREGNANT. DO NOT SPLIT, CRUSH, OR CHEW. REVIEW MEDICATION GUIDE. CALL [phone] FOR REFILLS. 30 tablet 11   NON FORMULARY Pt uses a cpap nightly     tirzepatide (MOUNJARO) 2.5 MG/0.5ML Pen Inject 2.5 mg into the skin once a week.     TRINTELLIX 5 MG TABS tablet Take 5 mg by mouth daily.     No facility-administered medications prior to visit.    PAST MEDICAL HISTORY: Past Medical History:  Diagnosis Date   Anemia    as child in first grade   Anxiety    Arthritis    Back pain    Constipation    CREST syndrome ([place])    Dyspnea    Family history of adverse reaction to anesthesia    mom has had problems in past - N/V post op on one occasion; difficult time waking up on another occasion   Gallbladder problem    GERD (gastroesophageal reflux disease)    Gestational diabetes mellitus in childbirth, diet controlled 1987   IBS (irritable bowel syndrome)    Joint pain    Leg edema    Menopause    Osteoarthritis (arthritis due to wear and tear of joints)    PAH (pulmonary artery hypertension) (HCC)    Pulmonary fibrosis (HCC)    Raynaud disease    hands-toes   Scleroderma (HCC)    Shortness of breath dyspnea    pulmonary fibrosis-scleraderma   Sleep apnea    Swallowing difficulty    Thyroid cyst    Vitamin D deficiency    Wears glasses     PAST SURGICAL HISTORY: Past Surgical History:  Procedure Laterality Date   CARDIAC CATHETERIZATION N/A 11/25/2015   Procedure: Right Heart Cath;  Surgeon: Jolaine Artist, MD;  Location: [facility];  Service: Cardiovascular;  Laterality: N/A;   CESAREAN SECTION  87,95   CHOLECYSTECTOMY  1996   lap choli   COLONOSCOPY     ESOPHAGOGASTRODUODENOSCOPY (EGD) WITH PROPOFOL N/A 02/12/2017   Procedure: ESOPHAGOGASTRODUODENOSCOPY (EGD) WITH PROPOFOL;  Surgeon: Clarene Essex, MD;  Location: [facility];  Service: Endoscopy;   Laterality: N/A;   HIATAL HERNIA REPAIR  07/26/2015   Procedure: LAPAROSCOPIC REPAIR OF HIATAL HERNIA;  Surgeon: Johnathan Hausen, MD;  Location: [facility];  Service: General;;   LAPAROSCOPIC GASTRIC SLEEVE RESECTION N/A 07/26/2015   Procedure: LAPAROSCOPIC GASTRIC SLEEVE RESECTION;  Surgeon: Johnathan Hausen, MD;  Location: [facility];  Service: General;  Laterality: N/A;   LEFT AND RIGHT HEART CATHETERIZATION WITH CORONARY ANGIOGRAM N/A 03/14/2015   Procedure: LEFT AND RIGHT HEART CATHETERIZATION WITH CORONARY ANGIOGRAM;  Surgeon: Peter M Martinique, MD;  Location: [facility];  Service: Cardiovascular;  Laterality: N/A;   POSTERIOR CERVICAL FUSION/FORAMINOTOMY N/A 01/16/2018   Procedure: Cervical one-two  Posterior instrumentation and fusion;  Surgeon: Tressie Stalker, MD;  Location: [facility];  Service: Neurosurgery;  Laterality: N/A;   RADIAL HEAD ARTHROPLASTY Right 07/28/2014   Procedure: RIGHT RADIAL HEAD REPLACEMENT;  Surgeon: Dairl Ponder, MD;  Location: [facility];  Service: Orthopedics;  Laterality: Right;   RIGHT HEART CATH N/A 03/18/2017   Procedure: Right Heart Cath;  Surgeon: Dolores Patty, MD;  Location: [facility];  Service: Cardiovascular;  Laterality: N/A;   RIGHT HEART CATH N/A 09/27/2020   Procedure: RIGHT HEART CATH;  Surgeon: Dolores Patty, MD;  Location: [facility];  Service: Cardiovascular;  Laterality: N/A;   RIGHT HEART CATH N/A 08/13/2022   Procedure: RIGHT HEART CATH;  Surgeon: Dolores Patty, MD;  Location: [facility];  Service: Cardiovascular;  Laterality: N/A;   SAVORY DILATION N/A 02/12/2017   Procedure: SAVORY DILATION;  Surgeon: Vida Rigger, MD;  Location: [facility];  Service: Endoscopy;  Laterality: N/A;   TUBAL LIGATION     UPPER GI ENDOSCOPY  07/26/2015   Procedure: UPPER GI ENDOSCOPY;  Surgeon: Luretha Murphy, MD;  Location: [facility];  Service: General;;   VAGINAL HYSTERECTOMY  2010   LAVH    FAMILY HISTORY: Family History   Problem Relation Age of Onset   Heart failure Mother        Stent put in 2014 for blockage   COPD Mother    Hypertension Mother    Hyperlipidemia Mother    Heart disease Mother    Thyroid disease Mother    Cancer Mother    Depression Mother    Anxiety disorder Mother    Heart disease Father    Heart disease Son        Repair ASD   Neuropathy Neg Hx     SOCIAL HISTORY: Social History   Socioeconomic History   Marital status: Married    Spouse name: W Anayi Bricco   Number of children: 2   Years of education: Not on file   Highest education level: Not on file  Occupational History   Occupation: Facilities manager    Comment: Office Work  Tobacco Use   Smoking status: Never   Smokeless tobacco: Never  Vaping Use   Vaping Use: Never used  Substance and Sexual Activity   Alcohol use: Yes    Alcohol/week: 0.0 standard drinks of alcohol    Comment: seldom   Drug use: No   Sexual activity: Not on file  Other Topics Concern   Not on file  Social History Narrative   Lives at home with her husband   Right handed   Social Determinants of Health   Financial Resource Strain: Not on file  Food Insecurity: Not on file  Transportation Needs: Not on file  Physical Activity: Not on file  Stress: Not on file  Social Connections: Not on file  Intimate Partner Violence: Not on file      PHYSICAL EXAM Generalized: Well developed, in no acute distress   Neurological examination  Mentation: Alert oriented to time, place, history taking. Follows all commands speech and language fluent Cranial nerve II-XII:Extraocular movements were full. Facial symmetry noted.   Reflexes: UTA  DIAGNOSTIC DATA (LABS, IMAGING, TESTING) - I reviewed patient records, labs, notes, testing and imaging myself where available.  Lab Results  Component Value Date   WBC 6.4 08/08/2022   HGB 11.6 (L) 08/13/2022   HCT 34.0 (L) 08/13/2022   MCV 92.9 08/08/2022  PLT 227  08/08/2022      Component Value Date/Time   NA 146 (H) 08/13/2022 0814   NA 142 05/12/2020 1221   K 2.9 (L) 08/13/2022 0814   CL 110 08/08/2022 1612   CO2 23 08/08/2022 1612   GLUCOSE 82 08/08/2022 1612   BUN 10 08/08/2022 1612   BUN 15 05/12/2020 1221   CREATININE 0.95 08/08/2022 1612   CREATININE 0.75 03/08/2015 1501   CALCIUM 9.1 08/08/2022 1612   PROT 6.3 (L) 08/08/2022 1612   PROT 6.5 05/12/2020 1221   ALBUMIN 3.7 08/08/2022 1612   ALBUMIN 4.1 05/12/2020 1221   AST 22 08/08/2022 1612   ALT 16 08/08/2022 1612   ALKPHOS 86 08/08/2022 1612   BILITOT 0.2 (L) 08/08/2022 1612   BILITOT 0.6 05/12/2020 1221   GFRNONAA >60 08/08/2022 1612   GFRNONAA >89 03/08/2015 1501   GFRAA >60 09/22/2020 1522   GFRAA >89 03/08/2015 1501   Lab Results  Component Value Date   CHOL 169 05/12/2020   HDL 71 05/12/2020   LDLCALC 82 05/12/2020   TRIG 85 05/12/2020   Lab Results  Component Value Date   HGBA1C 4.8 03/23/2020   Lab Results  Component Value Date   VITAMINB12 433 11/10/2019   Lab Results  Component Value Date   TSH 1.080 05/12/2020      ASSESSMENT AND PLAN 56 y.o. year old female  has a past medical history of Anemia, Anxiety, Arthritis, Back pain, Constipation, CREST syndrome (HCC), Dyspnea, Family history of adverse reaction to anesthesia, Gallbladder problem, GERD (gastroesophageal reflux disease), Gestational diabetes mellitus in childbirth, diet controlled (1987), IBS (irritable bowel syndrome), Joint pain, Leg edema, Menopause, Osteoarthritis (arthritis due to wear and tear of joints), PAH (pulmonary artery hypertension) (HCC), Pulmonary fibrosis (HCC), Raynaud disease, Scleroderma (HCC), Shortness of breath dyspnea, Sleep apnea, Swallowing difficulty, Thyroid cyst, Vitamin D deficiency, and Wears glasses. here with:  Trigeminal neuralgia on the right  Continue baclofen 5 mg at bedtime Continue gabapentin 100 mg at bedtime Follow-up in 1 year or sooner if  needed    Butch Penny, MSN, NP-C 09/11/2022, 1:48 PM [facility] [address], [address] [place], [place] [address] [phone]

## 2022-09-27 DIAGNOSIS — K219 Gastro-esophageal reflux disease without esophagitis: Secondary | ICD-10-CM | POA: Diagnosis not present

## 2022-09-27 DIAGNOSIS — Z23 Encounter for immunization: Secondary | ICD-10-CM | POA: Diagnosis not present

## 2022-09-27 DIAGNOSIS — R609 Edema, unspecified: Secondary | ICD-10-CM | POA: Diagnosis not present

## 2022-09-27 DIAGNOSIS — Z9889 Other specified postprocedural states: Secondary | ICD-10-CM | POA: Diagnosis not present

## 2022-09-27 DIAGNOSIS — F321 Major depressive disorder, single episode, moderate: Secondary | ICD-10-CM | POA: Diagnosis not present

## 2022-09-27 DIAGNOSIS — Z13220 Encounter for screening for lipoid disorders: Secondary | ICD-10-CM | POA: Diagnosis not present

## 2022-09-27 DIAGNOSIS — Z0000 Encounter for general adult medical examination without abnormal findings: Secondary | ICD-10-CM | POA: Diagnosis not present

## 2022-10-02 ENCOUNTER — Telehealth (HOSPITAL_COMMUNITY): Payer: Self-pay | Admitting: Internal Medicine

## 2022-10-02 NOTE — Telephone Encounter (Signed)
Pt called regarding her echo bubble from September being scheduled.  I advised her I would follow up on pre auth with clinic and call her to schedule as soon as I hear back.

## 2022-10-02 NOTE — Telephone Encounter (Signed)
Pt called regarding her echo bubble from September being scheduled.  I advised her I would follow up on pre auth with clinic and call her to schedule as soon as I hear back.  

## 2022-10-03 NOTE — Telephone Encounter (Signed)
Order placed on 3/15, Philicia are you working on pre-cert?

## 2022-10-04 DIAGNOSIS — M349 Systemic sclerosis, unspecified: Secondary | ICD-10-CM | POA: Diagnosis not present

## 2022-10-04 DIAGNOSIS — M19049 Primary osteoarthritis, unspecified hand: Secondary | ICD-10-CM | POA: Diagnosis not present

## 2022-10-04 DIAGNOSIS — M341 CR(E)ST syndrome: Secondary | ICD-10-CM | POA: Diagnosis not present

## 2022-10-04 DIAGNOSIS — M1990 Unspecified osteoarthritis, unspecified site: Secondary | ICD-10-CM | POA: Diagnosis not present

## 2022-10-04 NOTE — Telephone Encounter (Signed)
No pre cert is req. Appt has been scheduled

## 2022-10-11 ENCOUNTER — Ambulatory Visit (HOSPITAL_COMMUNITY)
Admission: RE | Admit: 2022-10-11 | Discharge: 2022-10-11 | Disposition: A | Discharge status: Home / Self Care | Payer: BC Managed Care – PPO | Source: Ambulatory Visit | Attending: Internal Medicine | Admitting: Internal Medicine

## 2022-10-11 DIAGNOSIS — I2721 Secondary pulmonary arterial hypertension: Secondary | ICD-10-CM | POA: Diagnosis not present

## 2022-10-11 LAB — ECHOCARDIOGRAM COMPLETE BUBBLE STUDY
AR max vel: 3.28 cm2
AV Area VTI: 3.16 cm2
AV Area mean vel: 3.31 cm2
AV Mean grad: 3 mmHg
AV Peak grad: 5.2 mmHg
Ao pk vel: 1.14 m/s
Area-P 1/2: 3.77 cm2
Calc EF: 57.5 %
MV M vel: 2.29 m/s
MV Peak grad: 21 mmHg
S' Lateral: 2.8 cm
Single Plane A2C EF: 65.4 %
Single Plane A4C EF: 50.4 %

## 2022-10-11 NOTE — Progress Notes (Signed)
  Echocardiogram 2D Echocardiogram has been performed.  Rachael Camacho 10/11/2022, 3:54 PM

## 2022-10-16 ENCOUNTER — Ambulatory Visit: Payer: BC Managed Care – PPO | Admitting: Pulmonary Disease

## 2022-11-22 ENCOUNTER — Ambulatory Visit (HOSPITAL_BASED_OUTPATIENT_CLINIC_OR_DEPARTMENT_OTHER): Payer: BC Managed Care – PPO | Admitting: Pulmonary Disease

## 2022-12-11 DIAGNOSIS — R0982 Postnasal drip: Secondary | ICD-10-CM | POA: Diagnosis not present

## 2022-12-11 DIAGNOSIS — R053 Chronic cough: Secondary | ICD-10-CM | POA: Diagnosis not present

## 2023-01-03 DIAGNOSIS — Z1231 Encounter for screening mammogram for malignant neoplasm of breast: Secondary | ICD-10-CM | POA: Diagnosis not present

## 2023-01-03 DIAGNOSIS — Z6834 Body mass index (BMI) 34.0-34.9, adult: Secondary | ICD-10-CM | POA: Diagnosis not present

## 2023-01-03 DIAGNOSIS — Z13820 Encounter for screening for osteoporosis: Secondary | ICD-10-CM | POA: Diagnosis not present

## 2023-01-03 DIAGNOSIS — Z01419 Encounter for gynecological examination (general) (routine) without abnormal findings: Secondary | ICD-10-CM | POA: Diagnosis not present

## 2023-01-03 DIAGNOSIS — Z1151 Encounter for screening for human papillomavirus (HPV): Secondary | ICD-10-CM | POA: Diagnosis not present

## 2023-01-03 DIAGNOSIS — Z1272 Encounter for screening for malignant neoplasm of vagina: Secondary | ICD-10-CM | POA: Diagnosis not present

## 2023-01-09 DIAGNOSIS — M349 Systemic sclerosis, unspecified: Secondary | ICD-10-CM | POA: Diagnosis not present

## 2023-01-09 DIAGNOSIS — M341 CR(E)ST syndrome: Secondary | ICD-10-CM | POA: Diagnosis not present

## 2023-01-09 DIAGNOSIS — M19049 Primary osteoarthritis, unspecified hand: Secondary | ICD-10-CM | POA: Diagnosis not present

## 2023-01-09 DIAGNOSIS — M1990 Unspecified osteoarthritis, unspecified site: Secondary | ICD-10-CM | POA: Diagnosis not present

## 2023-01-21 ENCOUNTER — Encounter (HOSPITAL_BASED_OUTPATIENT_CLINIC_OR_DEPARTMENT_OTHER): Payer: Self-pay | Admitting: Pulmonary Disease

## 2023-01-21 ENCOUNTER — Ambulatory Visit (INDEPENDENT_AMBULATORY_CARE_PROVIDER_SITE_OTHER): Payer: BC Managed Care – PPO | Admitting: Pulmonary Disease

## 2023-01-21 VITALS — BP 124/78 | HR 68 | Temp 98.1°F | Ht 64.0 in | Wt 193.4 lb

## 2023-01-21 DIAGNOSIS — M341 CR(E)ST syndrome: Secondary | ICD-10-CM | POA: Diagnosis not present

## 2023-01-21 DIAGNOSIS — I2721 Secondary pulmonary arterial hypertension: Secondary | ICD-10-CM | POA: Diagnosis not present

## 2023-01-21 DIAGNOSIS — M349 Systemic sclerosis, unspecified: Secondary | ICD-10-CM | POA: Diagnosis not present

## 2023-01-21 NOTE — Patient Instructions (Addendum)
HRCT chest in sep 2024  I will discuss with dr bensimhon

## 2023-01-21 NOTE — Progress Notes (Signed)
   Subjective:    Patient ID: Rachael Camacho, female    DOB: 08-19-1966, 57 y.o.   MRN: [id number]  HPI  57 yo never smoker for follow-up of pulmonary hypertension, nocturnal hypoxia   . PMH OSA - CPAP was dc'd 08/2015 after HST, CREST syndrome, trigeminal neuralgia,  obesity s/p gastric sleeve 2016 C1-2 fusion   Chief Complaint  Patient presents with   Follow-up    Pt states her breathing may have gotten worse since LOV. Pt states she is still having SOB with exertion but catches breath quickly.    Last visit was 05/2020 She underwent right heart cath in 2023 which showed stepup of saturation and echo confirmed PFO with small left-to-right shunt She is maintained on macitentan.  She has not obtained any follow-up from Dr. Haroldine Laws after the studies. Her breathing is at baseline. She denies significant dysphagia or telangiectasias on her hand Reviewed last report from cardiology and rheumatology She would like to avoid intensive testing due to cost  Significant tests/ events reviewed  01/2015 saturation about 93%-on walking she desaturate to 84%   Home sleep study 02/2015 >> AHI 10.6, SaO2 low 70%.  She spent 329.7 min (84.7% of test time) with SaO2 < 90%.  Consistent with mild sleep apnea   09/2022 echo/bubble study >> small PFO with shunt  RHC 07/2022 Mild PAH with elevated PVR O2 step up between SVC and RA concerning for small L-> R shunt  RA = 5 RV = 53/11 PA = 54/17 (35) PCW = 8 Fick cardiac output/index = 7.3/3.7 PVR = 3.7 WU Ao sat = 96% PA sat = 74%, 74% SVC sat = 68% (65%, 68%, 70%) RA = 79% RV = 76%    RHC 02/2017  showed PA pressure in the 60s, PVR increased to 5.3 WU   Right heart cath >> 02/2015 -right atrial pressure 10, pulmonary capillary wedge pressure 14 and PA pressure 50/19. PVR calculates to 2.7 WU (normal range). PA pressures are up due to high output and pulmonary vasodilators not indicated.    HST 08/2015 >> low AHI, desatn > stay on 2L  O2    RHC 11/2015 PVR 2.8 Wu   PFT 11/2015 DLCO 58%   PFTs 01/2017  showed lung volumes preserved, DLCO stable at 60% PFTs 04/2020 lung volumes maintained, DLCO decreased from 13.7/60% to 10 0.70/53%      HRCT 2016 showed mil centrilobular groundglass and dilated main pulmonary artery.   Review of Systems neg for any significant sore throat, dysphagia, itching, sneezing, nasal congestion or excess/ purulent secretions, fever, chills, sweats, unintended wt loss, pleuritic or exertional cp, hempoptysis, orthopnea pnd or change in chronic leg swelling. Also denies presyncope, palpitations, heartburn, abdominal pain, nausea, vomiting, diarrhea or change in bowel or urinary habits, dysuria,hematuria, rash, arthralgias, visual complaints, headache, numbness weakness or ataxia.     Objective:   Physical Exam  Gen. Pleasant, well-nourished, in no distress ENT - no thrush, no pallor/icterus,no post nasal drip Neck: No JVD, no thyromegaly, no carotid bruits Lungs: no use of accessory muscles, no dullness to percussion, clear without rales or rhonchi  Cardiovascular: Rhythm regular, heart sounds  normal, no murmurs or gallops, no peripheral edema Musculoskeletal: No deformities, no cyanosis or clubbing        Assessment & Plan:

## 2023-01-21 NOTE — Assessment & Plan Note (Signed)
Appears stable by symptoms. We will obtain HRCT in 6 months and PFTs next year, will stagger testing based on patient's request

## 2023-01-21 NOTE — Assessment & Plan Note (Signed)
Severity is mostly unchanged since 2016.  She is maintained on macitentan.  There was mention of adding sildenafil, will defer to cardiology if this is necessary at this time

## 2023-01-30 ENCOUNTER — Telehealth (HOSPITAL_COMMUNITY): Payer: Self-pay | Admitting: Vascular Surgery

## 2023-01-30 NOTE — Telephone Encounter (Signed)
Lvm to make f/u appt / pt scheduled 2/14 @ 320,asked pt to call back to confirm

## 2023-02-06 ENCOUNTER — Ambulatory Visit (HOSPITAL_COMMUNITY)
Admission: RE | Admit: 2023-02-06 | Discharge: 2023-02-06 | Disposition: A | Discharge status: Home / Self Care | Payer: BC Managed Care – PPO | Source: Ambulatory Visit | Attending: Internal Medicine | Admitting: Internal Medicine

## 2023-02-06 ENCOUNTER — Encounter (HOSPITAL_COMMUNITY): Payer: Self-pay | Admitting: Internal Medicine

## 2023-02-06 VITALS — BP 102/60 | HR 87 | Wt 192.2 lb

## 2023-02-06 DIAGNOSIS — I270 Primary pulmonary hypertension: Secondary | ICD-10-CM | POA: Insufficient documentation

## 2023-02-06 DIAGNOSIS — I5032 Chronic diastolic (congestive) heart failure: Secondary | ICD-10-CM | POA: Diagnosis not present

## 2023-02-06 DIAGNOSIS — I272 Pulmonary hypertension, unspecified: Secondary | ICD-10-CM

## 2023-02-06 DIAGNOSIS — G4733 Obstructive sleep apnea (adult) (pediatric): Secondary | ICD-10-CM

## 2023-02-06 NOTE — Patient Instructions (Signed)
There has been no changes to your medications.  Your physician recommends that you schedule a follow-up appointment in: 4 months ( May 2024)  ** please call the office in March to arrange your follow up appointment. **  If you have any questions or concerns before your next appointment please send [place] a message through [place] or call our office at [phone].    TO LEAVE A MESSAGE FOR THE NURSE SELECT OPTION 2, PLEASE LEAVE A MESSAGE INCLUDING: YOUR NAME DATE OF BIRTH CALL BACK NUMBER REASON FOR CALL**this is important as we prioritize the call backs  YOU WILL RECEIVE A CALL BACK THE SAME DAY AS LONG AS YOU CALL BEFORE 4:00 PM  At the [facility], you and your health needs are our priority. As part of our continuing mission to provide you with exceptional heart care, we have created designated Provider Care Teams. These Care Teams include your primary Cardiologist (physician) and Advanced Practice Providers (APPs- Physician Assistants and Nurse Practitioners) who all work together to provide you with the care you need, when you need it.   You may see any of the following providers on your designated Care Team at your next follow up: Dr Glori Bickers Dr Loralie Champagne Dr. Roxana Hires, NP Lyda Jester, [facility] [place], [place] Forestine Na, NP Audry Riles, PharmD   Please be sure to bring in all your medications bottles to every appointment.    Thank you for choosing [facility]

## 2023-02-06 NOTE — H&P (View-Only) (Signed)
Advanced Heart Failure Clinic Note   Patient ID: Rachael Camacho, female   DOB: 08/23/1966, 56 y.o.   MRN: [id number] PCP: Primary Cardiologist: Dr Crenshaw.  Primary HF: Dr Tamikka Pilger Pulmonary: Dr Alva.  Rheumatologist: Dr. Syed - [facility]   HPI: Rachael Camacho is a 56 year old with a history of  OSA, chornic diastolic HF, obesity, scleroderma, and Raynauds disease referred to HF clinic by Dr Alva.   Echo 7/21 EF 60-65% RV normal. TR jet inadequate to assess PA pressures.   Echo 8/23 EF 60-65% RV normal RVSP 44mmHG Personally reviewed  RHC 8/23 with mild PAH with elevated PVR 2. O2 step up between SVC and RA concerning for small L-> R shunt RA = 5 RV = 53/11 PA = 54/17 (35) PCW = 8 Fick cardiac output/index = 7.3/3.7 PVR = 3.7 WU Ao sat = 96% PA sat = 74%, 74% SVC sat = 68% (65%, 68%, 70%) RA = 79% RV = 76% Qp/Qs = 0.77  She returns for PAH/HF follow up. RHC done in 8/23. F/u echo with bubble -> positive for interatrial shunting.  Remains on macitenan 10 mg daily. Still with NYHA II-III dyspnea. Minimal LE edema managed well with daily lasix. No orthopnea or PND    Studies/Tests  2021 RHC  RA = 6 RV = 40/5 PA = 39/14 (25) PCW = 10 Fick cardiac output/index = 6.4/3.2 PVR = 2.2 WU FA sat = 99% PA sat = 76%, 77% SVC sat = 69%  PFTs 5/21 FEV1 2.59 (99%) FVC 3.17 (95%) DLCO 53%  ONOX: 7/21 1hr 28 min with sats < 88% (lowest 76%)   01/2015- 93% at rest but non walking she desaturate to 84%   CT angio 11/2014 was negative for pulmonary embolism, but showed mosaic pattern suggestive of early interstitial edema >> no response to Lasix CT angio 12/16: No ILD  VQ 7/16: No PE  RHC 02/2017: RA = 7 RV = 62/11 PA = 61/23 (39) PCW = 8 Fick cardiac output/index = 5.8/3.0 PVR = 5.3 WU Ao sat = 98% PA sat = 74%, 73% Mild to moderate PAH in setting of scleroderma.   RHC /LHC 03/14/2015  RA 13/12 mean 10 mm Hg RV 53/11 mm Hg PA 50/19 mean 33 mm Hg   PCWP 18/16 mean 14 mm Hg LV 169/13/22 mm Hg  AO 165/92 mean 119 mm Hg Oxygen saturations: PA 73% AO 95% Cardiac Output (Fick) 6.9 L/min  Cardiac Index (Fick) 3.2 L/min/meter squared PVR = 2.8 WU            No significant CAD     Review of systems complete and found to be negative unless listed in HPI.   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  Tobacco Use   Smoking status: Never   Smokeless tobacco: Never  Vaping Use   Vaping Use: Never used  Substance and Sexual Activity   Alcohol use: Yes    Alcohol/week: 0.0 standard drinks of alcohol    Comment: seldom   Drug use: No   Sexual activity: Not on file  Other Topics Concern   Not on file  Social History Narrative   Lives at   home with her husband   Right handed   Social Determinants of Health   Financial Resource Strain: Not on file  Food Insecurity: Not on file  Transportation Needs: Not on file  Physical Activity: Not on file  Stress: Not on file  Social Connections: Not on file  Intimate Partner Violence: Not on file    FH:  Family History  Problem Relation Age of Onset   Heart failure Mother        Stent put in 2014 for blockage   COPD Mother    Hypertension Mother    Hyperlipidemia Mother    Heart disease Mother    Thyroid disease Mother    Cancer Mother    Depression Mother    Anxiety disorder Mother    Heart disease Father    Heart disease Son        Repair ASD   Neuropathy Neg Hx     Past Medical History:  Diagnosis Date   Anemia    as child in first grade   Anxiety    Arthritis    Back pain    Constipation    CREST syndrome (HCC)    Dyspnea    Family history of adverse reaction to anesthesia    mom has had problems in past -  N/V post op on one occasion; difficult time waking up on another occasion   Gallbladder problem    GERD (gastroesophageal reflux disease)    Gestational diabetes mellitus in childbirth, diet controlled 1987   IBS (irritable bowel syndrome)    Joint pain    Leg edema    Menopause    Osteoarthritis (arthritis due to wear and tear of joints)    PAH (pulmonary artery hypertension) (HCC)    Pulmonary fibrosis (HCC)    Raynaud disease    hands-toes   Scleroderma (HCC)    Shortness of breath dyspnea    pulmonary fibrosis-scleraderma   Sleep apnea    Swallowing difficulty    Thyroid cyst    Vitamin D deficiency    Wears glasses     Current Outpatient Medications  Medication Sig Dispense Refill   alendronate (FOSAMAX) 70 MG tablet Take 70 mg by mouth once a week. Take with a full glass of water on an empty stomach.     baclofen (LIORESAL) 10 MG tablet Take 5 mg by mouth daily.     buPROPion (WELLBUTRIN XL) 150 MG 24 hr tablet Take 150 mg by mouth daily.     diclofenac sodium (VOLTAREN) 1 % GEL Apply 2-4 g topically 4 (four) times daily as needed (for knee pain.).     fluticasone (FLONASE) 50 MCG/ACT nasal spray Place 1 spray into both nostrils daily as needed for allergies or rhinitis.      furosemide (LASIX) 40 MG tablet Take 40 mg by mouth daily.     gabapentin (NEURONTIN) 100 MG capsule Take 1 capsule (100 mg total) by mouth at bedtime. 90 capsule 3   hydroxychloroquine (PLAQUENIL) 200 MG tablet Take 200 mg by mouth 2 (two) times daily.     levothyroxine (SYNTHROID) 50 MCG tablet Take 50 mcg by mouth daily before breakfast.     loratadine (CLARITIN) 10 MG tablet Take 10 mg by mouth daily.     macitentan (OPSUMIT) 10 MG tablet TAKE 1 TABLET BY MOUTH DAILY. DO NOT HANDLE IF PREGNANT. DO NOT SPLIT, CRUSH, OR CHEW. REVIEW MEDICATION GUIDE. CALL [phone] FOR REFILLS. 30 tablet 11   tirzepatide (MOUNJARO) 2.5   MG/0.5ML Pen Inject 2.5 mg into the skin once a week.     TRINTELLIX 5 MG TABS  tablet Take 5 mg by mouth daily.     NON FORMULARY Pt uses a cpap nightly (Patient not taking: Reported on 02/06/2023)     No current facility-administered medications for this encounter.    Vitals:   02/06/23 1540  BP: 102/60  Pulse: 87  SpO2: 97%  Weight: 87.2 kg (192 lb 3.2 oz)     Wt Readings from Last 3 Encounters:  02/06/23 87.2 kg (192 lb 3.2 oz)  01/21/23 87.7 kg (193 lb 6.4 oz)  08/13/22 93 kg (205 lb)    PHYSICAL EXAM: General:  Well appearing. No resp difficulty HEENT: normal + telengectasias  Neck: supple. JVP 7. Carotids 2+ bilat; no bruits. No lymphadenopathy or thryomegaly appreciated. Cor: PMI nondisplaced. Regular rate & rhythm. No rubs, gallops or murmurs. Lungs: clear Abdomen: obese soft, nontender, nondistended. No hepatosplenomegaly. No bruits or masses. Good bowel sounds. Extremities: no cyanosis, clubbing, rash, tr edema Neuro: alert & orientedx3, cranial nerves grossly intact. moves all 4 extremities w/o difficulty. Affect pleasant   ASSESSMENT & PLAN:  1. Pulmonary HTN - mild PAH in setting of scleroderma. - RHC 02/2017: PA 61/23 (39), PCW 8. - RHC 2021: PA = 39/14 (25) PCW 10  PVR 2.2  - Echo 8/23 EF 60-65% RV normal RVSP 44mmHG Personally reviewed - RHC 8/23 with mild PAH with elevated PVR & O2 step up between SVC and RA concerning for small L-> R shunt RA 5 PA 54/17 (35) PCW 8 Fick 7.3/3.7 PVR 3.7 WU - NYHA III. Volume ok - Continue opsimut 10 mg daily - Plan TEE to assess for ASD/PFO  - Add tadalafil after TEE  2. Possible shunt physiology on RHC with + bubble on echo - TEE as above  3. Chronic Diastolic HF - Echo 2018: EF 60-65%, RV normal - Echo 2021  EF 60-65% Diastolic function felt to be normal. RV normal  - Echo 8/23 EF 60-65%. RV normal. RVSP 50s.  - NYHA III. Volume status ok on recent RHC - Continue lasix 20 mg daily. Consider switch to SGLT2i  4. OSA  - Using CPAP nightly.  - follow with Dr. Alva   4. Scleroderma -  stable No recent flares  5. Chronic respiratory failure with exertional desaturations - Follows with Dr Alva - now with nighttime O2 but not wearing it  - PFTs stable   6. 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    

## 2023-02-06 NOTE — Progress Notes (Signed)
Advanced Heart Failure Clinic Note   Patient ID: Rachael Camacho, female   DOB: 05/09/1966, 57 y.o.   MRN: [id number] PCP: Primary Cardiologist: Dr Stanford Breed.  Primary HF: Dr Haroldine Laws Pulmonary: Dr Elsworth Soho.  Rheumatologist: Dr. Dossie Der [facility]   HPI: Rachael Camacho is a 57 year old with a history of  OSA, chornic diastolic HF, obesity, scleroderma, and Raynauds disease referred to HF clinic by Dr Elsworth Soho.   Echo 7/21 EF 60-65% RV normal. TR jet inadequate to assess PA pressures.   Echo 8/23 EF 60-65% RV normal RVSP 70mHG Personally reviewed  RHC 8/23 with mild PAH with elevated PVR 2. O2 step up between SVC and RA concerning for small L-> R shunt RA = 5 RV = 53/11 PA = 54/17 (35) PCW = 8 Fick cardiac output/index = 7.3/3.7 PVR = 3.7 WU Ao sat = 96% PA sat = 74%, 74% SVC sat = 68% (65%, 68%, 70%) RA = 79% RV = 76% Qp/Qs = 0.77  She returns for PAH/HF follow up. RHC done in 8/23. F/u echo with bubble -> positive for interatrial shunting.  Remains on macitenan 10 mg daily. Still with NYHA II-III dyspnea. Minimal LE edema managed well with daily lasix. No orthopnea or PND    Studies/Tests  2021 RHC  RA = 6 RV = 40/5 PA = 39/14 (25) PCW = 10 Fick cardiac output/index = 6.4/3.2 PVR = 2.2 WU FA sat = 99% PA sat = 76%, 77% SVC sat = 69%  PFTs 5/21 FEV1 2.59 (99%) FVC 3.17 (95%) DLCO 53%  ONOX: 7/21 1hr 28 min with sats < 88% (lowest 76%)   01/2015- 93% at rest but non walking she desaturate to 84%   CT angio 11/2014 was negative for pulmonary embolism, but showed mosaic pattern suggestive of early interstitial edema >> no response to Lasix CT angio 12/16: No ILD  VQ 7/16: No PE  RHC 02/2017: RA = 7 RV = 62/11 PA = 61/23 (39) PCW = 8 Fick cardiac output/index = 5.8/3.0 PVR = 5.3 WU Ao sat = 98% PA sat = 74%, 73% Mild to moderate PAH in setting of scleroderma.   [facility]  RA 13/12 mean 10 mm Hg RV 53/11 mm Hg PA 50/19 mean 33 mm Hg   PCWP 18/16 mean 14 mm Hg LV 169/13/22 mm Hg  AO 165/92 mean 119 mm Hg Oxygen saturations: PA 73% AO 95% Cardiac Output (Fick) 6.9 L/min  Cardiac Index (Fick) 3.2 L/min/meter squared PVR = 2.8 WU            No significant CAD     Review of systems complete and found to be negative unless listed in HPI.   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  Tobacco Use   Smoking status: Never   Smokeless tobacco: Never  Vaping Use   Vaping Use: Never used  Substance and Sexual Activity   Alcohol use: Yes    Alcohol/week: 0.0 standard drinks of alcohol    Comment: seldom   Drug use: No   Sexual activity: Not on file  Other Topics Concern   Not on file  Social History Narrative   Lives at  home with her husband   Right handed   Social Determinants of Health   Financial Resource Strain: Not on file  Food Insecurity: Not on file  Transportation Needs: Not on file  Physical Activity: Not on file  Stress: Not on file  Social Connections: Not on file  Intimate Partner Violence: Not on file    FH:  Family History  Problem Relation Age of Onset   Heart failure Mother        Stent put in 2014 for blockage   COPD Mother    Hypertension Mother    Hyperlipidemia Mother    Heart disease Mother    Thyroid disease Mother    Cancer Mother    Depression Mother    Anxiety disorder Mother    Heart disease Father    Heart disease Son        Repair ASD   Neuropathy Neg Hx     Past Medical History:  Diagnosis Date   Anemia    as child in first grade   Anxiety    Arthritis    Back pain    Constipation    CREST syndrome ([place])    Dyspnea    Family history of adverse reaction to anesthesia    mom has had problems in past -  N/V post op on one occasion; difficult time waking up on another occasion   Gallbladder problem    GERD (gastroesophageal reflux disease)    Gestational diabetes mellitus in childbirth, diet controlled 1987   IBS (irritable bowel syndrome)    Joint pain    Leg edema    Menopause    Osteoarthritis (arthritis due to wear and tear of joints)    PAH (pulmonary artery hypertension) (HCC)    Pulmonary fibrosis (HCC)    Raynaud disease    hands-toes   Scleroderma (HCC)    Shortness of breath dyspnea    pulmonary fibrosis-scleraderma   Sleep apnea    Swallowing difficulty    Thyroid cyst    Vitamin D deficiency    Wears glasses     Current Outpatient Medications  Medication Sig Dispense Refill   alendronate (FOSAMAX) 70 MG tablet Take 70 mg by mouth once a week. Take with a full glass of water on an empty stomach.     baclofen (LIORESAL) 10 MG tablet Take 5 mg by mouth daily.     buPROPion (WELLBUTRIN XL) 150 MG 24 hr tablet Take 150 mg by mouth daily.     diclofenac sodium (VOLTAREN) 1 % GEL Apply 2-4 g topically 4 (four) times daily as needed (for knee pain.).     fluticasone (FLONASE) 50 MCG/ACT nasal spray Place 1 spray into both nostrils daily as needed for allergies or rhinitis.      furosemide (LASIX) 40 MG tablet Take 40 mg by mouth daily.     gabapentin (NEURONTIN) 100 MG capsule Take 1 capsule (100 mg total) by mouth at bedtime. 90 capsule 3   hydroxychloroquine (PLAQUENIL) 200 MG tablet Take 200 mg by mouth 2 (two) times daily.     levothyroxine (SYNTHROID) 50 MCG tablet Take 50 mcg by mouth daily before breakfast.     loratadine (CLARITIN) 10 MG tablet Take 10 mg by mouth daily.     macitentan (OPSUMIT) 10 MG tablet TAKE 1 TABLET BY MOUTH DAILY. DO NOT HANDLE IF PREGNANT. DO NOT SPLIT, CRUSH, OR CHEW. REVIEW MEDICATION GUIDE. CALL [phone] FOR REFILLS. 30 tablet 11   tirzepatide (MOUNJARO) 2.5  MG/0.5ML Pen Inject 2.5 mg into the skin once a week.     TRINTELLIX 5 MG TABS  tablet Take 5 mg by mouth daily.     NON FORMULARY Pt uses a cpap nightly (Patient not taking: Reported on 02/06/2023)     No current facility-administered medications for this encounter.    Vitals:   02/06/23 1540  BP: 102/60  Pulse: 87  SpO2: 97%  Weight: 87.2 kg (192 lb 3.2 oz)     Wt Readings from Last 3 Encounters:  02/06/23 87.2 kg (192 lb 3.2 oz)  01/21/23 87.7 kg (193 lb 6.4 oz)  08/13/22 93 kg (205 lb)    PHYSICAL EXAM: General:  Well appearing. No resp difficulty HEENT: normal + telengectasias  Neck: supple. JVP 7. Carotids 2+ bilat; no bruits. No lymphadenopathy or thryomegaly appreciated. Cor: PMI nondisplaced. Regular rate & rhythm. No rubs, gallops or murmurs. Lungs: clear Abdomen: obese soft, nontender, nondistended. No hepatosplenomegaly. No bruits or masses. Good bowel sounds. Extremities: no cyanosis, clubbing, rash, tr edema Neuro: alert & orientedx3, cranial nerves grossly intact. moves all 4 extremities w/o difficulty. Affect pleasant   ASSESSMENT & PLAN:  1. Pulmonary HTN - mild PAH in setting of scleroderma. - RHC 02/2017: PA 61/23 (39), PCW 8. - RHC 2021: PA = 39/14 (25) PCW 10  PVR 2.2  - Echo 8/23 EF 60-65% RV normal RVSP 49mHG Personally reviewed - RHC 8/23 with mild PAH with elevated PVR & O2 step up between SVC and RA concerning for small L-> R shunt RA 5 PA 54/17 (35) PCW 8 Fick 7.3/3.7 PVR 3.7 WU - NYHA III. Volume ok - Continue opsimut 10 mg daily - Plan TEE to assess for ASD/PFO  - Add tadalafil after TEE  2. Possible shunt physiology on RHC with + bubble on echo - TEE as above  3. Chronic Diastolic HF - Echo 299991111 EF 60-65%, RV normal - Echo 2021  EF [id number] function felt to be normal. RV normal  - Echo 8/23 EF 60-65%. RV normal. RVSP 50s.  - NYHA III. Volume status ok on recent RHC - Continue lasix 20 mg daily. Consider switch to SGLT2i  4. OSA  - Using CPAP nightly.  - follow with Dr. AElsworth Soho  4. Scleroderma -  stable No recent flares  5. Chronic respiratory failure with exertional desaturations - Follows with Dr AElsworth Soho- now with nighttime O2 but not wearing it  - PFTs stable   6. 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

## 2023-02-07 ENCOUNTER — Other Ambulatory Visit (HOSPITAL_COMMUNITY): Payer: Self-pay

## 2023-02-07 ENCOUNTER — Ambulatory Visit (HOSPITAL_BASED_OUTPATIENT_CLINIC_OR_DEPARTMENT_OTHER): Payer: BC Managed Care – PPO

## 2023-02-07 ENCOUNTER — Telehealth (HOSPITAL_COMMUNITY): Payer: Self-pay

## 2023-02-07 DIAGNOSIS — I2721 Secondary pulmonary arterial hypertension: Secondary | ICD-10-CM

## 2023-02-07 NOTE — Telephone Encounter (Signed)
Called patient to give her time and date of her TEE procedure. Went through the instructions of procedure with patient of what medications to hold, what time to be at the hospital at. I will also mail the patient a letter with the written instructions.

## 2023-03-01 ENCOUNTER — Other Ambulatory Visit: Payer: Self-pay

## 2023-03-01 ENCOUNTER — Ambulatory Visit (HOSPITAL_BASED_OUTPATIENT_CLINIC_OR_DEPARTMENT_OTHER)
Admission: RE | Admit: 2023-03-01 | Discharge: 2023-03-01 | Disposition: A | Discharge status: Home / Self Care | Payer: BC Managed Care – PPO | Source: Ambulatory Visit | Attending: Internal Medicine | Admitting: Internal Medicine

## 2023-03-01 ENCOUNTER — Ambulatory Visit (HOSPITAL_COMMUNITY): Payer: BC Managed Care – PPO | Admitting: Certified Registered"

## 2023-03-01 ENCOUNTER — Encounter (HOSPITAL_COMMUNITY)
Admission: RE | Disposition: A | Discharge status: Home / Self Care | Payer: Self-pay | Source: Home / Self Care | Attending: Internal Medicine

## 2023-03-01 ENCOUNTER — Ambulatory Visit (HOSPITAL_COMMUNITY)
Admission: RE | Admit: 2023-03-01 | Discharge: 2023-03-01 | Disposition: A | Discharge status: Home / Self Care | Payer: BC Managed Care – PPO | Attending: Internal Medicine | Admitting: Internal Medicine

## 2023-03-01 DIAGNOSIS — J9610 Chronic respiratory failure, unspecified whether with hypoxia or hypercapnia: Secondary | ICD-10-CM | POA: Insufficient documentation

## 2023-03-01 DIAGNOSIS — Z79899 Other long term (current) drug therapy: Secondary | ICD-10-CM | POA: Diagnosis not present

## 2023-03-01 DIAGNOSIS — Z9884 Bariatric surgery status: Secondary | ICD-10-CM | POA: Diagnosis not present

## 2023-03-01 DIAGNOSIS — I272 Pulmonary hypertension, unspecified: Secondary | ICD-10-CM | POA: Diagnosis not present

## 2023-03-01 DIAGNOSIS — I7300 Raynaud's syndrome without gangrene: Secondary | ICD-10-CM | POA: Diagnosis not present

## 2023-03-01 DIAGNOSIS — I5032 Chronic diastolic (congestive) heart failure: Secondary | ICD-10-CM | POA: Insufficient documentation

## 2023-03-01 DIAGNOSIS — E669 Obesity, unspecified: Secondary | ICD-10-CM | POA: Diagnosis not present

## 2023-03-01 DIAGNOSIS — I2721 Secondary pulmonary arterial hypertension: Secondary | ICD-10-CM

## 2023-03-01 DIAGNOSIS — G4733 Obstructive sleep apnea (adult) (pediatric): Secondary | ICD-10-CM | POA: Insufficient documentation

## 2023-03-01 DIAGNOSIS — Z6832 Body mass index (BMI) 32.0-32.9, adult: Secondary | ICD-10-CM | POA: Diagnosis not present

## 2023-03-01 DIAGNOSIS — M349 Systemic sclerosis, unspecified: Secondary | ICD-10-CM | POA: Insufficient documentation

## 2023-03-01 DIAGNOSIS — E119 Type 2 diabetes mellitus without complications: Secondary | ICD-10-CM | POA: Diagnosis not present

## 2023-03-01 DIAGNOSIS — I110 Hypertensive heart disease with heart failure: Secondary | ICD-10-CM | POA: Insufficient documentation

## 2023-03-01 DIAGNOSIS — I10 Essential (primary) hypertension: Secondary | ICD-10-CM | POA: Diagnosis not present

## 2023-03-01 DIAGNOSIS — Q211 Atrial septal defect, unspecified: Secondary | ICD-10-CM | POA: Diagnosis not present

## 2023-03-01 DIAGNOSIS — I081 Rheumatic disorders of both mitral and tricuspid valves: Secondary | ICD-10-CM | POA: Diagnosis not present

## 2023-03-01 HISTORY — PX: TEE WITHOUT CARDIOVERSION: SHX5443

## 2023-03-01 LAB — POCT I-STAT, CHEM 8
BUN: 14 mg/dL (ref 6–20)
Calcium, Ion: 1.14 mmol/L — ABNORMAL LOW (ref 1.15–1.40)
Chloride: 105 mmol/L (ref 98–111)
Creatinine, Ser: 0.8 mg/dL (ref 0.44–1.00)
Glucose, Bld: 70 mg/dL (ref 70–99)
HCT: 39 % (ref 36.0–46.0)
Hemoglobin: 13.3 g/dL (ref 12.0–15.0)
Potassium: 5.1 mmol/L (ref 3.5–5.1)
Sodium: 140 mmol/L (ref 135–145)
TCO2: 27 mmol/L (ref 22–32)

## 2023-03-01 LAB — GLUCOSE, CAPILLARY: Glucose-Capillary: 75 mg/dL (ref 70–99)

## 2023-03-01 LAB — ECHO TEE

## 2023-03-01 SURGERY — ECHOCARDIOGRAM, TRANSESOPHAGEAL
Anesthesia: Monitor Anesthesia Care

## 2023-03-01 MED ORDER — EPHEDRINE SULFATE-NACL 50-0.9 MG/10ML-% IV SOSY
PREFILLED_SYRINGE | INTRAVENOUS | Status: DC | PRN
  Administered 2023-03-01: 10 mg via INTRAVENOUS
  Administered 2023-03-01: 15 mg via INTRAVENOUS

## 2023-03-01 MED ORDER — PROPOFOL 500 MG/50ML IV EMUL
INTRAVENOUS | Status: DC | PRN
  Administered 2023-03-01: 250 ug/kg/min via INTRAVENOUS

## 2023-03-01 MED ORDER — SODIUM CHLORIDE BACTERIOSTATIC 0.9 % IJ SOLN
INTRAMUSCULAR | Status: DC | PRN
  Administered 2023-03-01: 9 mL

## 2023-03-01 MED ORDER — PROPOFOL 10 MG/ML IV BOLUS
INTRAVENOUS | Status: DC | PRN
  Administered 2023-03-01: 60 mg via INTRAVENOUS
  Administered 2023-03-01: 40 mg via INTRAVENOUS

## 2023-03-01 MED ORDER — GLYCOPYRROLATE 0.2 MG/ML IJ SOLN
INTRAMUSCULAR | Status: DC | PRN
  Administered 2023-03-01: .1 mg via INTRAVENOUS

## 2023-03-01 MED ORDER — LIDOCAINE 2% (20 MG/ML) 5 ML SYRINGE
INTRAMUSCULAR | Status: DC | PRN
  Administered 2023-03-01: 100 mg via INTRAVENOUS

## 2023-03-01 MED ORDER — SODIUM CHLORIDE 0.9 % IV SOLN: INTRAVENOUS | Status: DC | PRN

## 2023-03-01 NOTE — Anesthesia Postprocedure Evaluation (Signed)
Anesthesia Post Note  Patient: Rachael Camacho  Procedure(s) Performed: TRANSESOPHAGEAL ECHOCARDIOGRAM (TEE)     Patient location during evaluation: PACU Anesthesia Type: MAC Level of consciousness: awake and alert Pain management: pain level controlled Vital Signs Assessment: post-procedure vital signs reviewed and stable Respiratory status: spontaneous breathing, nonlabored ventilation, respiratory function stable and patient connected to nasal cannula oxygen Cardiovascular status: stable and blood pressure returned to baseline Postop Assessment: no apparent nausea or vomiting Anesthetic complications: no  No notable events documented.  Last Vitals:  Vitals:   03/01/23 0905 03/01/23 0910  BP: (!) 91/52 102/61  Pulse: 90 90  Resp: 16 17  Temp:    SpO2: 97% 98%    Last Pain:  Vitals:   03/01/23 0847  TempSrc:   PainSc: 0-No pain                 Effie Berkshire

## 2023-03-01 NOTE — Progress Notes (Deleted)
Echocardiogram 2D Echocardiogram has been performed.  Rachael Camacho 03/01/2023, 9:07 AM

## 2023-03-01 NOTE — Discharge Instructions (Signed)

## 2023-03-01 NOTE — Transfer of Care (Signed)
Immediate Anesthesia Transfer of Care Note  Patient: Rachael Camacho  Procedure(s) Performed: TRANSESOPHAGEAL ECHOCARDIOGRAM (TEE)  Patient Location: PACU  Anesthesia Type:MAC  Level of Consciousness: awake, alert , and oriented  Airway & Oxygen Therapy: Patient Spontanous Breathing  Post-op Assessment: Report given to RN  Post vital signs: Reviewed and stable  Last Vitals:  Vitals Value Taken Time  BP 102/61 03/01/23 0910  Temp    Pulse 89 03/01/23 0911  Resp 22 03/01/23 0911  SpO2 100 % 03/01/23 0911  Vitals shown include unvalidated device data.  Last Pain:  Vitals:   03/01/23 0847  TempSrc:   PainSc: 0-No pain         Complications: No notable events documented.

## 2023-03-01 NOTE — Progress Notes (Signed)
Echocardiogram Echocardiogram Transesophageal has been performed.  Rachael Camacho 03/01/2023, 11:15 AM

## 2023-03-01 NOTE — Interval H&P Note (Signed)
History and Physical Interval Note:  03/01/2023 7:48 AM  Rachael Camacho  has presented today for surgery, with the diagnosis of ASD.  The various methods of treatment have been discussed with the patient and family. After consideration of risks, benefits and other options for treatment, the patient has consented to  Procedure(s): TRANSESOPHAGEAL ECHOCARDIOGRAM (TEE) (N/A) as a surgical intervention.  The patient's history has been reviewed, patient examined, no change in status, stable for surgery.  I have reviewed the patient's chart and labs.  Questions were answered to the patient's satisfaction.     Rachael Camacho

## 2023-03-01 NOTE — CV Procedure (Signed)
    TRANSESOPHAGEAL ECHOCARDIOGRAM   NAME:  SYMONE CORNMAN   MRN: [id number] DOB:  1966-01-01   ADMIT DATE: 03/01/2023  INDICATIONS:  PAH  PROCEDURE:   Informed consent was obtained prior to the procedure. The risks, benefits and alternatives for the procedure were discussed and the patient comprehended these risks.  Risks include, but are not limited to, cough, sore throat, vomiting, nausea, somnolence, esophageal and stomach trauma or perforation, bleeding, low blood pressure, aspiration, pneumonia, infection, trauma to the teeth and death.    After a procedural time-out, the patient was sedated by the anesthesiology service. he transesophageal probe was inserted in the esophagus and stomach without difficulty and multiple views were obtained.    COMPLICATIONS:    There were no immediate complications.  FINDINGS:  LEFT VENTRICLE: EF = 60-65%. No regional wall motion abnormalities.  RIGHT VENTRICLE: Normal size and function.   LEFT ATRIUM: Normal  LEFT ATRIAL APPENDAGE: No thrombus.   RIGHT ATRIUM: Moderately to severely enlarged  AORTIC VALVE:  Trileaflet.  No AS/AI  MITRAL VALVE:    Normal. Trivial MR  TRICUSPID VALVE: Normal. Trivial TR  PULMONIC VALVE: Grossly normal.  INTERATRIAL SEPTUM: Aneurysmal. No obvious PFO or ASD. Bubble weakly positive with mostly late bubbles  PERICARDIUM: No effusion  DESCENDING AORTA: Mild plaque   CONCLUSION:  No ASD. Suspect possible pulmonary AVMs. Will review previous chest CT to evaluate pulmonary vein drainage  Benay Spice 8:46 AM

## 2023-03-01 NOTE — Anesthesia Preprocedure Evaluation (Addendum)
Anesthesia Evaluation  Patient identified by MRN, date of birth, ID band Patient awake    Reviewed: Allergy & Precautions, NPO status , Patient's Chart, lab work & pertinent test results  Airway Mallampati: I  TM Distance: >3 FB Neck ROM: Full    Dental  (+) Teeth Intact, Dental Advisory Given   Pulmonary sleep apnea    breath sounds clear to auscultation       Cardiovascular hypertension, Pt. on medications  Rhythm:Regular Rate:Normal     Neuro/Psych   Anxiety      Neuromuscular disease    GI/Hepatic Neg liver ROS,GERD  ,,  Endo/Other  diabetes    Renal/GU negative Renal ROS     Musculoskeletal  (+) Arthritis ,    Abdominal   Peds  Hematology   Anesthesia Other Findings   Reproductive/Obstetrics                             Anesthesia Physical Anesthesia Plan  ASA: 2  Anesthesia Plan: MAC   Post-op Pain Management: Minimal or no pain anticipated   Induction: Intravenous  PONV Risk Score and Plan: 0 and Propofol infusion  Airway Management Planned: Natural Airway and Nasal Cannula  Additional Equipment: None  Intra-op Plan:   Post-operative Plan:   Informed Consent: I have reviewed the patients History and Physical, chart, labs and discussed the procedure including the risks, benefits and alternatives for the proposed anesthesia with the patient or authorized representative who has indicated his/her understanding and acceptance.       Plan Discussed with: CRNA  Anesthesia Plan Comments:        Anesthesia Quick Evaluation

## 2023-03-05 ENCOUNTER — Encounter (HOSPITAL_COMMUNITY): Payer: Self-pay | Admitting: Internal Medicine

## 2023-04-09 DIAGNOSIS — K219 Gastro-esophageal reflux disease without esophagitis: Secondary | ICD-10-CM | POA: Diagnosis not present

## 2023-04-09 DIAGNOSIS — Z9102 Food additives allergy status: Secondary | ICD-10-CM | POA: Diagnosis not present

## 2023-04-09 DIAGNOSIS — Z79899 Other long term (current) drug therapy: Secondary | ICD-10-CM | POA: Diagnosis not present

## 2023-04-09 DIAGNOSIS — R071 Chest pain on breathing: Secondary | ICD-10-CM | POA: Diagnosis not present

## 2023-04-09 DIAGNOSIS — M797 Fibromyalgia: Secondary | ICD-10-CM | POA: Diagnosis not present

## 2023-04-09 DIAGNOSIS — W000XXA Fall on same level due to ice and snow, initial encounter: Secondary | ICD-10-CM | POA: Diagnosis not present

## 2023-04-09 DIAGNOSIS — R296 Repeated falls: Secondary | ICD-10-CM | POA: Diagnosis not present

## 2023-04-09 DIAGNOSIS — S0181XA Laceration without foreign body of other part of head, initial encounter: Secondary | ICD-10-CM | POA: Diagnosis not present

## 2023-04-09 DIAGNOSIS — Z882 Allergy status to sulfonamides status: Secondary | ICD-10-CM | POA: Diagnosis not present

## 2023-04-09 DIAGNOSIS — Y990 Civilian activity done for income or pay: Secondary | ICD-10-CM | POA: Diagnosis not present

## 2023-04-09 DIAGNOSIS — Z885 Allergy status to narcotic agent status: Secondary | ICD-10-CM | POA: Diagnosis not present

## 2023-04-18 ENCOUNTER — Telehealth (HOSPITAL_COMMUNITY): Payer: Self-pay | Admitting: Pharmacist

## 2023-04-18 NOTE — Telephone Encounter (Signed)
Patient Advocate Encounter °  °Received notification from [employer] that prior authorization for O[employer] is required. °  °PA submitted via fax °Status is pending °  °Will continue to follow. ° °Analaya Hoey, PharmD, BCPS, BCCP, CPP °Heart Failure Clinic Pharmacist °[phone] ° °

## 2023-05-03 DIAGNOSIS — M341 CR(E)ST syndrome: Secondary | ICD-10-CM | POA: Diagnosis not present

## 2023-05-03 DIAGNOSIS — M79671 Pain in right foot: Secondary | ICD-10-CM | POA: Diagnosis not present

## 2023-05-03 DIAGNOSIS — M25562 Pain in left knee: Secondary | ICD-10-CM | POA: Diagnosis not present

## 2023-05-03 DIAGNOSIS — M25572 Pain in left ankle and joints of left foot: Secondary | ICD-10-CM | POA: Diagnosis not present

## 2023-05-03 DIAGNOSIS — M25552 Pain in left hip: Secondary | ICD-10-CM | POA: Diagnosis not present

## 2023-05-03 DIAGNOSIS — M349 Systemic sclerosis, unspecified: Secondary | ICD-10-CM | POA: Diagnosis not present

## 2023-05-03 DIAGNOSIS — M25561 Pain in right knee: Secondary | ICD-10-CM | POA: Diagnosis not present

## 2023-05-03 DIAGNOSIS — M25551 Pain in right hip: Secondary | ICD-10-CM | POA: Diagnosis not present

## 2023-05-03 DIAGNOSIS — M25571 Pain in right ankle and joints of right foot: Secondary | ICD-10-CM | POA: Diagnosis not present

## 2023-05-03 DIAGNOSIS — M1990 Unspecified osteoarthritis, unspecified site: Secondary | ICD-10-CM | POA: Diagnosis not present

## 2023-05-03 DIAGNOSIS — M79672 Pain in left foot: Secondary | ICD-10-CM | POA: Diagnosis not present

## 2023-05-03 DIAGNOSIS — M19049 Primary osteoarthritis, unspecified hand: Secondary | ICD-10-CM | POA: Diagnosis not present

## 2023-05-13 DIAGNOSIS — L821 Other seborrheic keratosis: Secondary | ICD-10-CM | POA: Diagnosis not present

## 2023-05-13 DIAGNOSIS — D225 Melanocytic nevi of trunk: Secondary | ICD-10-CM | POA: Diagnosis not present

## 2023-05-13 DIAGNOSIS — L820 Inflamed seborrheic keratosis: Secondary | ICD-10-CM | POA: Diagnosis not present

## 2023-05-13 DIAGNOSIS — Z85828 Personal history of other malignant neoplasm of skin: Secondary | ICD-10-CM | POA: Diagnosis not present

## 2023-05-13 DIAGNOSIS — M341 CR(E)ST syndrome: Secondary | ICD-10-CM | POA: Diagnosis not present

## 2023-05-13 DIAGNOSIS — R768 Other specified abnormal immunological findings in serum: Secondary | ICD-10-CM | POA: Diagnosis not present

## 2023-05-13 DIAGNOSIS — D485 Neoplasm of uncertain behavior of skin: Secondary | ICD-10-CM | POA: Diagnosis not present

## 2023-05-13 DIAGNOSIS — M349 Systemic sclerosis, unspecified: Secondary | ICD-10-CM | POA: Diagnosis not present

## 2023-06-26 ENCOUNTER — Other Ambulatory Visit (HOSPITAL_COMMUNITY): Payer: Self-pay | Admitting: Pharmacist

## 2023-06-26 MED ORDER — OPSUMIT 10 MG PO TABS: ORAL_TABLET | ORAL | 11 refills | Status: DC

## 2023-08-28 DIAGNOSIS — R112 Nausea with vomiting, unspecified: Secondary | ICD-10-CM | POA: Diagnosis not present

## 2023-08-28 DIAGNOSIS — R634 Abnormal weight loss: Secondary | ICD-10-CM | POA: Diagnosis not present

## 2023-08-28 DIAGNOSIS — K219 Gastro-esophageal reflux disease without esophagitis: Secondary | ICD-10-CM | POA: Diagnosis not present

## 2023-09-03 ENCOUNTER — Ambulatory Visit
Admission: RE | Admit: 2023-09-03 | Discharge: 2023-09-03 | Disposition: A | Discharge status: Home / Self Care | Payer: BC Managed Care – PPO | Source: Ambulatory Visit | Attending: Pulmonary Disease | Admitting: Pulmonary Disease

## 2023-09-03 DIAGNOSIS — I2510 Atherosclerotic heart disease of native coronary artery without angina pectoris: Secondary | ICD-10-CM | POA: Diagnosis not present

## 2023-09-03 DIAGNOSIS — R0609 Other forms of dyspnea: Secondary | ICD-10-CM | POA: Diagnosis not present

## 2023-09-03 DIAGNOSIS — I700 Atherosclerosis of aorta: Secondary | ICD-10-CM | POA: Diagnosis not present

## 2023-09-03 DIAGNOSIS — M349 Systemic sclerosis, unspecified: Secondary | ICD-10-CM

## 2023-09-06 ENCOUNTER — Telehealth (INDEPENDENT_AMBULATORY_CARE_PROVIDER_SITE_OTHER): Payer: BC Managed Care – PPO | Admitting: Adult Health

## 2023-09-06 DIAGNOSIS — G500 Trigeminal neuralgia: Secondary | ICD-10-CM | POA: Diagnosis not present

## 2023-09-06 MED ORDER — GABAPENTIN 100 MG PO CAPS: 100.0000 mg | ORAL_CAPSULE | Freq: Every day | ORAL | 3 refills | Status: DC

## 2023-09-06 MED ORDER — BACLOFEN 10 MG PO TABS: 5.0000 mg | ORAL_TABLET | Freq: Every day | ORAL | 11 refills | Status: DC

## 2023-09-06 NOTE — Progress Notes (Signed)
PATIENT: Rachael Camacho DOB: Feb 21, 1966  REASON FOR VISIT: follow up HISTORY FROM: patient  Virtual Visit via Video Note  I connected with Burley Saver on 09/06/23 at  9:45 AM EDT by a video enabled telemedicine application located remotely at [facility] and verified that I am speaking with the correct person using two identifiers who was located at their own home.   I discussed the limitations of evaluation and management by telemedicine and the availability of in person appointments. The patient expressed understanding and agreed to proceed.   PATIENT: Rachael Camacho DOB: 08-01-1966  REASON FOR VISIT: follow up HISTORY FROM: patient  HISTORY OF PRESENT ILLNESS: Today 09/06/23:  Rachael Camacho is a 57 y.o. female with a history of trigeminal neuralgia on the right side. Returns today for follow-up.  Overall she feels that her symptoms have been managed well with baclofen and gabapentin.  Denies any breakthrough symptoms unless she misses a dose.  Overall doing well   09/11/22: Rachael Camacho is a 57 year old female with a history of trigeminal neuralgia on the right side.  She returns today for follow-up.  Overall she feels that she is doing well.  Baclofen and gabapentin continue to work well for her.  She really does not have any breakthrough symptoms.  She states that she can tell if she misses a dose.  Otherwise she is doing fairly well.  09/05/21: Rachael Camacho is a 57 year old female with a history of trigeminal neuralgia on the right side.  She returns today.  She continues on 5 mg of baclofen and 100 mg of gabapentin at bedtime.  She reports that this has been controlling her symptoms.  She states that typical triggers are when she yawns or if she bites on something hard.  Overall she feels her symptoms are well controlled.  She joins me today for virtual visit  HISTORY 08/25/20:   Rachael Camacho is a 58 year old female with a history of trigeminal  neuralgia.  She reports that as long as she takes baclofen and gabapentin she has been doing well.  Reports that she has been out of her baclofen for several months.  She reports that discomfort in the right side of the face is slowly coming back.  She reports that on occasion if she yawns she feels pain.  She states there are times she feels that she is getting an earache or if she bites down on food she will have an electric-like pain.  She denies any new symptoms.  Returns today for an evaluation.   REVIEW OF SYSTEMS: Out of a complete 14 system review of symptoms, the patient complains only of the following symptoms, and all other reviewed systems are negative.  ALLERGIES: Allergies  Allergen Reactions   Amoxicillin Other (See Comments)    Immediate yeast infection  Has patient had a PCN reaction causing immediate rash, facial/tongue/throat swelling, SOB or lightheadedness with hypotension: No Has patient had a PCN reaction causing severe rash involving mucus membranes or skin necrosis: No Has patient had a PCN reaction that required hospitalization: No Has patient had a PCN reaction occurring within the last 10 years: No If all of the above answers are "NO", then may proceed with Cephalosporin use.    Nsaids     Other Reaction(s): gastric sleeve   Percocet [Oxycodone-Acetaminophen] Itching    Tolerates acetaminophen alone   [name] [Nitrofurantoin Monohyd Macro] Rash and Hives   [name] Hives and Rash  Reported chicken pox-like rash, not hives   Sulfa Antibiotics Rash and Hives    HOME MEDICATIONS: Outpatient Medications Prior to Visit  Medication Sig Dispense Refill   alendronate (FOSAMAX) 70 MG tablet Take 70 mg by mouth once a week. Take with a full glass of water on an empty stomach.     baclofen (LIORESAL) 10 MG tablet Take 5 mg by mouth daily.     buPROPion (WELLBUTRIN XL) 150 MG 24 hr tablet Take 150 mg by mouth daily.     diclofenac sodium (VOLTAREN) 1 % GEL Apply  2-4 g topically 4 (four) times daily as needed (for knee pain.).     fluticasone (FLONASE) 50 MCG/ACT nasal spray Place 1 spray into both nostrils daily as needed for allergies or rhinitis.      furosemide (LASIX) 40 MG tablet Take 40 mg by mouth daily.     gabapentin (NEURONTIN) 100 MG capsule Take 1 capsule (100 mg total) by mouth at bedtime. 90 capsule 3   hydroxychloroquine (PLAQUENIL) 200 MG tablet Take 200 mg by mouth 2 (two) times daily.     levothyroxine (SYNTHROID) 50 MCG tablet Take 50 mcg by mouth daily before breakfast.     loratadine (CLARITIN) 10 MG tablet Take 10 mg by mouth daily.     macitentan (OPSUMIT) 10 MG tablet TAKE 1 TABLET BY MOUTH DAILY. DO NOT HANDLE IF PREGNANT. DO NOT SPLIT, CRUSH, OR CHEW. REVIEW MEDICATION GUIDE. CALL [phone] FOR REFILLS. 30 tablet 11   NON FORMULARY Pt uses a cpap nightly (Patient not taking: Reported on 02/06/2023)     tirzepatide [facility]) 2.5 MG/0.5ML Pen Inject 2.5 mg into the skin once a week.     TRINTELLIX 5 MG TABS tablet Take 5 mg by mouth daily.     No facility-administered medications prior to visit.    PAST MEDICAL HISTORY: Past Medical History:  Diagnosis Date   Anemia    as child in first grade   Anxiety    Arthritis    Back pain    Constipation    CREST syndrome (HCC)    Dyspnea    Family history of adverse reaction to anesthesia    mom has had problems in past - N/V post op on one occasion; difficult time waking up on another occasion   Gallbladder problem    GERD (gastroesophageal reflux disease)    Gestational diabetes mellitus in childbirth, diet controlled 1987   IBS (irritable bowel syndrome)    Joint pain    Leg edema    Menopause    Osteoarthritis (arthritis due to wear and tear of joints)    PAH (pulmonary artery hypertension) (HCC)    Pulmonary fibrosis (HCC)    Raynaud disease    hands-toes   Scleroderma (HCC)    Shortness of breath dyspnea    pulmonary fibrosis-scleraderma   Sleep apnea     Swallowing difficulty    Thyroid cyst    Vitamin D deficiency    Wears glasses     PAST SURGICAL HISTORY: Past Surgical History:  Procedure Laterality Date   CARDIAC CATHETERIZATION N/A 11/25/2015   Procedure: Right Heart Cath;  Surgeon: Dolores Patty, MD;  Location: [facility] INVASIVE CV LAB;  Service: Cardiovascular;  Laterality: N/A;   CESAREAN SECTION  87,95   CHOLECYSTECTOMY  1996   lap choli   COLONOSCOPY     ESOPHAGOGASTRODUODENOSCOPY (EGD) WITH PROPOFOL N/A 02/12/2017   Procedure: ESOPHAGOGASTRODUODENOSCOPY (EGD) WITH PROPOFOL;  Surgeon: Vida Rigger, MD;  Location: [facility] ENDOSCOPY;  Service: Endoscopy;  Laterality: N/A;   HIATAL HERNIA REPAIR  07/26/2015   Procedure: LAPAROSCOPIC REPAIR OF HIATAL HERNIA;  Surgeon: Luretha Murphy, MD;  Location: [facility] ORS;  Service: General;;   LAPAROSCOPIC GASTRIC SLEEVE RESECTION N/A 07/26/2015   Procedure: LAPAROSCOPIC GASTRIC SLEEVE RESECTION;  Surgeon: Luretha Murphy, MD;  Location: [facility] ORS;  Service: General;  Laterality: N/A;   LEFT AND RIGHT HEART CATHETERIZATION WITH CORONARY ANGIOGRAM N/A 03/14/2015   Procedure: LEFT AND RIGHT HEART CATHETERIZATION WITH CORONARY ANGIOGRAM;  Surgeon: Peter M Swaziland, MD;  Location: [facility] CATH LAB;  Service: Cardiovascular;  Laterality: N/A;   POSTERIOR CERVICAL FUSION/FORAMINOTOMY N/A 01/16/2018   Procedure: Cervical one-two Posterior instrumentation and fusion;  Surgeon: Tressie Stalker, MD;  Location: [facility] OR;  Service: Neurosurgery;  Laterality: N/A;   RADIAL HEAD ARTHROPLASTY Right 07/28/2014   Procedure: RIGHT RADIAL HEAD REPLACEMENT;  Surgeon: Dairl Ponder, MD;  Location: [facility];  Service: Orthopedics;  Laterality: Right;   RIGHT HEART CATH N/A 03/18/2017   Procedure: Right Heart Cath;  Surgeon: Dolores Patty, MD;  Location: [facility] INVASIVE CV LAB;  Service: Cardiovascular;  Laterality: N/A;   RIGHT HEART CATH N/A 09/27/2020   Procedure: RIGHT HEART CATH;  Surgeon: Dolores Patty, MD;  Location:  [facility] INVASIVE CV LAB;  Service: Cardiovascular;  Laterality: N/A;   RIGHT HEART CATH N/A 08/13/2022   Procedure: RIGHT HEART CATH;  Surgeon: Dolores Patty, MD;  Location: [facility] INVASIVE CV LAB;  Service: Cardiovascular;  Laterality: N/A;   SAVORY DILATION N/A 02/12/2017   Procedure: SAVORY DILATION;  Surgeon: Vida Rigger, MD;  Location: [facility]OSCOPY;  Service: Endoscopy;  Laterality: N/A;   TEE WITHOUT CARDIOVERSION N/A 03/01/2023   Procedure: TRANSESOPHAGEAL ECHOCARDIOGRAM (TEE);  Surgeon: Dolores Patty, MD;  Location: [facility] ENDOSCOPY;  Service: Cardiovascular;  Laterality: N/A;   TUBAL LIGATION     UPPER GI ENDOSCOPY  07/26/2015   Procedure: UPPER GI ENDOSCOPY;  Surgeon: Luretha Murphy, MD;  Location: [facility] ORS;  Service: General;;   VAGINAL HYSTERECTOMY  2010   LAVH    FAMILY HISTORY: Family History  Problem Relation Age of Onset   Heart failure Mother        Stent put in 2014 for blockage   COPD Mother    Hypertension Mother    Hyperlipidemia Mother    Heart disease Mother    Thyroid disease Mother    Cancer Mother    Depression Mother    Anxiety disorder Mother    Heart disease Father    Heart disease Son        Repair ASD   Neuropathy Neg Hx     SOCIAL HISTORY: Social History   Socioeconomic History   Marital status: Married    Spouse name: W Delaynee Edquist   Number of children: 2   Years of education: Not on file   Highest education level: Not on file  Occupational History   Occupation: Facilities manager    Comment: Office Work  Tobacco Use   Smoking status: Never   Smokeless tobacco: Never  Vaping Use   Vaping status: Never Used  Substance and Sexual Activity   Alcohol use: Yes    Alcohol/week: 0.0 standard drinks of alcohol    Comment: seldom   Drug use: No   Sexual activity: Not on file  Other Topics Concern   Not on file  Social History Narrative   Lives at home with her husband  Right handed   Social Determinants of Health    Financial Resource Strain: Not on file  Food Insecurity: Not on file  Transportation Needs: Not on file  Physical Activity: Not on file  Stress: Not on file  Social Connections: Unknown (05/05/2022)   Received from [facility]   Social Network    Social Network: Not on file  Intimate Partner Violence: Not At Risk (04/09/2023)   Received from [facility], [facility]   HITS    Over the last 12 months how often did your partner physically hurt you?: 1    Over the last 12 months how often did your partner insult you or talk down to you?: 1    Over the last 12 months how often did your partner threaten you with physical harm?: 1    Over the last 12 months how often did your partner scream or curse at you?: 1      PHYSICAL EXAM Generalized: Well developed, in no acute distress   Neurological examination  Mentation: Alert oriented to time, place, history taking. Follows all commands speech and language fluent Cranial nerve II-XII:Extraocular movements were full. Facial symmetry noted.   Reflexes: UTA  DIAGNOSTIC DATA (LABS, IMAGING, TESTING) - I reviewed patient records, labs, notes, testing and imaging myself where available.  Lab Results  Component Value Date   WBC 6.4 08/08/2022   HGB 13.3 03/01/2023   HCT 39.0 03/01/2023   MCV 92.9 08/08/2022   PLT 227 08/08/2022      Component Value Date/Time   NA 140 03/01/2023 0742   NA 142 05/12/2020 1221   K 5.1 03/01/2023 0742   CL 105 03/01/2023 0742   CO2 23 08/08/2022 1612   GLUCOSE 70 03/01/2023 0742   BUN 14 03/01/2023 0742   BUN 15 05/12/2020 1221   CREATININE 0.80 03/01/2023 0742   CREATININE 0.75 03/08/2015 1501   CALCIUM 9.1 08/08/2022 1612   PROT 6.3 (L) 08/08/2022 1612   PROT 6.5 05/12/2020 1221   ALBUMIN 3.7 08/08/2022 1612   ALBUMIN 4.1 05/12/2020 1221   AST 22 08/08/2022 1612   ALT 16 08/08/2022 1612   ALKPHOS 86 08/08/2022 1612   BILITOT 0.2 (L) 08/08/2022 1612   BILITOT 0.6  05/12/2020 1221   GFRNONAA >60 08/08/2022 1612   GFRNONAA >89 03/08/2015 1501   GFRAA >60 09/22/2020 1522   GFRAA >89 03/08/2015 1501   Lab Results  Component Value Date   CHOL 169 05/12/2020   HDL 71 05/12/2020   LDLCALC 82 05/12/2020   TRIG 85 05/12/2020   Lab Results  Component Value Date   HGBA1C 4.8 03/23/2020   Lab Results  Component Value Date   VITAMINB12 433 11/10/2019   Lab Results  Component Value Date   TSH 1.080 05/12/2020      ASSESSMENT AND PLAN 57 y.o. year old female  has a past medical history of Anemia, Anxiety, Arthritis, Back pain, Constipation, CREST syndrome (HCC), Dyspnea, Family history of adverse reaction to anesthesia, Gallbladder problem, GERD (gastroesophageal reflux disease), Gestational diabetes mellitus in childbirth, diet controlled (1987), IBS (irritable bowel syndrome), Joint pain, Leg edema, Menopause, Osteoarthritis (arthritis due to wear and tear of joints), PAH (pulmonary artery hypertension) (HCC), Pulmonary fibrosis (HCC), Raynaud disease, Scleroderma (HCC), Shortness of breath dyspnea, Sleep apnea, Swallowing difficulty, Thyroid cyst, Vitamin D deficiency, and Wears glasses. here with:  Trigeminal neuralgia on the right  Continue baclofen 5 mg at bedtime Continue gabapentin 100 mg at bedtime Follow-up in  1 year or sooner if needed    Butch Penny, MSN, NP-C 09/06/2023, 9:47 AM [facility] [address] [place], [place] [address] [phone]

## 2023-10-07 DIAGNOSIS — M25512 Pain in left shoulder: Secondary | ICD-10-CM | POA: Diagnosis not present

## 2023-10-07 DIAGNOSIS — Z23 Encounter for immunization: Secondary | ICD-10-CM | POA: Diagnosis not present

## 2023-10-07 DIAGNOSIS — Z13220 Encounter for screening for lipoid disorders: Secondary | ICD-10-CM | POA: Diagnosis not present

## 2023-10-07 DIAGNOSIS — Z9884 Bariatric surgery status: Secondary | ICD-10-CM | POA: Diagnosis not present

## 2023-10-07 DIAGNOSIS — Z8632 Personal history of gestational diabetes: Secondary | ICD-10-CM | POA: Diagnosis not present

## 2023-10-07 DIAGNOSIS — F321 Major depressive disorder, single episode, moderate: Secondary | ICD-10-CM | POA: Diagnosis not present

## 2023-10-07 DIAGNOSIS — R609 Edema, unspecified: Secondary | ICD-10-CM | POA: Diagnosis not present

## 2023-10-07 DIAGNOSIS — R635 Abnormal weight gain: Secondary | ICD-10-CM | POA: Diagnosis not present

## 2023-10-07 DIAGNOSIS — Z0000 Encounter for general adult medical examination without abnormal findings: Secondary | ICD-10-CM | POA: Diagnosis not present

## 2023-10-15 DIAGNOSIS — R29898 Other symptoms and signs involving the musculoskeletal system: Secondary | ICD-10-CM | POA: Diagnosis not present

## 2023-10-15 DIAGNOSIS — M25512 Pain in left shoulder: Secondary | ICD-10-CM | POA: Diagnosis not present

## 2023-10-15 DIAGNOSIS — M25511 Pain in right shoulder: Secondary | ICD-10-CM | POA: Diagnosis not present

## 2023-10-15 DIAGNOSIS — M19012 Primary osteoarthritis, left shoulder: Secondary | ICD-10-CM | POA: Diagnosis not present

## 2023-10-15 DIAGNOSIS — M898X1 Other specified disorders of bone, shoulder: Secondary | ICD-10-CM | POA: Diagnosis not present

## 2023-10-17 ENCOUNTER — Other Ambulatory Visit (HOSPITAL_BASED_OUTPATIENT_CLINIC_OR_DEPARTMENT_OTHER): Payer: Self-pay | Admitting: Sports Medicine

## 2023-10-17 DIAGNOSIS — R29898 Other symptoms and signs involving the musculoskeletal system: Secondary | ICD-10-CM

## 2023-10-26 DIAGNOSIS — M7542 Impingement syndrome of left shoulder: Secondary | ICD-10-CM | POA: Diagnosis not present

## 2023-10-28 DIAGNOSIS — M7542 Impingement syndrome of left shoulder: Secondary | ICD-10-CM | POA: Diagnosis not present

## 2023-10-30 DIAGNOSIS — M7542 Impingement syndrome of left shoulder: Secondary | ICD-10-CM | POA: Diagnosis not present

## 2023-11-01 ENCOUNTER — Ambulatory Visit (HOSPITAL_BASED_OUTPATIENT_CLINIC_OR_DEPARTMENT_OTHER): Admission: RE | Admit: 2023-11-01 | Payer: BC Managed Care – PPO | Source: Ambulatory Visit

## 2023-11-04 DIAGNOSIS — M7542 Impingement syndrome of left shoulder: Secondary | ICD-10-CM | POA: Diagnosis not present

## 2023-11-06 DIAGNOSIS — M7542 Impingement syndrome of left shoulder: Secondary | ICD-10-CM | POA: Diagnosis not present

## 2023-11-08 ENCOUNTER — Ambulatory Visit (HOSPITAL_BASED_OUTPATIENT_CLINIC_OR_DEPARTMENT_OTHER)
Admission: RE | Admit: 2023-11-08 | Discharge: 2023-11-08 | Disposition: A | Discharge status: Home / Self Care | Payer: BC Managed Care – PPO | Source: Ambulatory Visit | Attending: Sports Medicine | Admitting: Sports Medicine

## 2023-11-08 DIAGNOSIS — M7581 Other shoulder lesions, right shoulder: Secondary | ICD-10-CM | POA: Diagnosis not present

## 2023-11-08 DIAGNOSIS — R29898 Other symptoms and signs involving the musculoskeletal system: Secondary | ICD-10-CM | POA: Diagnosis not present

## 2023-11-08 DIAGNOSIS — M25511 Pain in right shoulder: Secondary | ICD-10-CM | POA: Diagnosis not present

## 2023-11-08 DIAGNOSIS — M12811 Other specific arthropathies, not elsewhere classified, right shoulder: Secondary | ICD-10-CM | POA: Diagnosis not present

## 2023-11-08 DIAGNOSIS — S46811A Strain of other muscles, fascia and tendons at shoulder and upper arm level, right arm, initial encounter: Secondary | ICD-10-CM | POA: Diagnosis not present

## 2023-11-11 DIAGNOSIS — M7542 Impingement syndrome of left shoulder: Secondary | ICD-10-CM | POA: Diagnosis not present

## 2023-11-15 DIAGNOSIS — M7542 Impingement syndrome of left shoulder: Secondary | ICD-10-CM | POA: Diagnosis not present

## 2023-11-18 DIAGNOSIS — M7542 Impingement syndrome of left shoulder: Secondary | ICD-10-CM | POA: Diagnosis not present

## 2023-11-20 DIAGNOSIS — M7542 Impingement syndrome of left shoulder: Secondary | ICD-10-CM | POA: Diagnosis not present

## 2023-11-28 DIAGNOSIS — M7542 Impingement syndrome of left shoulder: Secondary | ICD-10-CM | POA: Diagnosis not present

## 2023-12-10 DIAGNOSIS — M19011 Primary osteoarthritis, right shoulder: Secondary | ICD-10-CM | POA: Diagnosis not present

## 2023-12-23 DIAGNOSIS — M25511 Pain in right shoulder: Secondary | ICD-10-CM | POA: Diagnosis not present

## 2024-01-20 ENCOUNTER — Telehealth: Payer: Self-pay | Admitting: Pulmonary Disease

## 2024-01-20 NOTE — Telephone Encounter (Signed)
Fax received from Dr. Everardo Pacific with [facility] to perform a Right shoulder scope, hardware removal, possible radial head replacement elbow with interscalene blockon patient.  Patient needs surgery clearance. Surgery is . Patient was seen on 01/21/23. Office protocol is a risk assessment can be sent to surgeon if patient has been seen in 60 days or less.    Pt will need appt for clearance   Called pt and there was no answer- LMTCB

## 2024-01-24 ENCOUNTER — Telehealth (HOSPITAL_COMMUNITY): Payer: Self-pay | Admitting: Vascular Surgery

## 2024-01-24 NOTE — Telephone Encounter (Signed)
Lvm giving pt appt w/ db for surg clearance asked pt to call back to confirm appt  3/6 @ 340

## 2024-02-04 NOTE — Telephone Encounter (Signed)
Appt with Dr Vassie Loll set for 03/23/24.

## 2024-02-27 ENCOUNTER — Ambulatory Visit (HOSPITAL_COMMUNITY)
Admission: RE | Admit: 2024-02-27 | Discharge: 2024-02-27 | Disposition: A | Discharge status: Home / Self Care | Payer: BC Managed Care – PPO | Source: Ambulatory Visit | Attending: Internal Medicine | Admitting: Internal Medicine

## 2024-02-27 ENCOUNTER — Encounter (HOSPITAL_COMMUNITY): Payer: Self-pay | Admitting: Internal Medicine

## 2024-02-27 VITALS — BP 118/68 | HR 75 | Ht 64.0 in | Wt 195.2 lb

## 2024-02-27 DIAGNOSIS — I272 Pulmonary hypertension, unspecified: Secondary | ICD-10-CM | POA: Diagnosis not present

## 2024-02-27 DIAGNOSIS — E669 Obesity, unspecified: Secondary | ICD-10-CM | POA: Insufficient documentation

## 2024-02-27 DIAGNOSIS — I5032 Chronic diastolic (congestive) heart failure: Secondary | ICD-10-CM | POA: Diagnosis not present

## 2024-02-27 DIAGNOSIS — Z79899 Other long term (current) drug therapy: Secondary | ICD-10-CM | POA: Diagnosis not present

## 2024-02-27 DIAGNOSIS — I2721 Secondary pulmonary arterial hypertension: Secondary | ICD-10-CM | POA: Diagnosis not present

## 2024-02-27 DIAGNOSIS — I7300 Raynaud's syndrome without gangrene: Secondary | ICD-10-CM | POA: Diagnosis not present

## 2024-02-27 DIAGNOSIS — Z9884 Bariatric surgery status: Secondary | ICD-10-CM | POA: Insufficient documentation

## 2024-02-27 DIAGNOSIS — J9610 Chronic respiratory failure, unspecified whether with hypoxia or hypercapnia: Secondary | ICD-10-CM | POA: Insufficient documentation

## 2024-02-27 DIAGNOSIS — R42 Dizziness and giddiness: Secondary | ICD-10-CM | POA: Diagnosis not present

## 2024-02-27 DIAGNOSIS — M349 Systemic sclerosis, unspecified: Secondary | ICD-10-CM | POA: Insufficient documentation

## 2024-02-27 DIAGNOSIS — G4733 Obstructive sleep apnea (adult) (pediatric): Secondary | ICD-10-CM | POA: Insufficient documentation

## 2024-02-27 DIAGNOSIS — Z91199 Patient's noncompliance with other medical treatment and regimen due to unspecified reason: Secondary | ICD-10-CM | POA: Diagnosis not present

## 2024-02-27 DIAGNOSIS — Z01810 Encounter for preprocedural cardiovascular examination: Secondary | ICD-10-CM | POA: Diagnosis present

## 2024-02-27 NOTE — Progress Notes (Signed)
 Advanced Heart Failure Clinic Note   Patient ID: Rachael Camacho, female   DOB: 10-08-1966, 58 y.o.   MRN: [id number] PCP: Primary Cardiologist: Dr Jens Som.  Primary HF: Dr Gala Romney Pulmonary: Dr Vassie Loll.  Rheumatologist: Dr. Kathi Ludwig [facility]   Chief Complaint: Heart Failure and Preoperative Clearance.   HPI: Rachael Camacho is a 58 year old with a history of  OSA, chornic diastolic HF, obesity, scleroderma, and Raynauds disease.    Cath 2016 no CAD  Echo 7/21 EF 60-65% RV normal. TR jet inadequate to assess PA pressures.   Echo 8/23 EF 60-65% RV normal RVSP Personally reviewed  RHC 8/23 with mild PAH with elevated PVR 2. O2 step up between SVC and RA concerning for small L-> R shunt RA = 5 RV = 53/11 PA = 54/17 (35) PCW = 8 Fick cardiac output/index = 7.3/3.7 PVR = 3.7 WU Ao sat = 96% PA sat = 74%, 74% SVC sat = 68% (65%, 68%, 70%) RA = 79% RV = 76% Qp/Qs = 0.77  02/2023 TEE EF  Ef 60-65%. RV normal   Remains on macitentan  Today she returns for HF follow up and pre operative clearance for elbow and shoulder surgery. Complaining of fatigue. SOB with exertion.  SOB walking up steps. Denies PND/Orthopnea. She has not been as active due to cold weather. Lightheaded when she bends over.  With valsalva she gets lightheaded. She has been using CPAP. Appetite ok. No fever or chills. Weight at home trending up. Now off monjauro. Taking all medications.   Studies/Tests  2021 RHC  RA = 6 RV = 40/5 PA = 39/14 (25) PCW = 10 Fick cardiac output/index = 6.4/3.2 PVR = 2.2 WU FA sat = 99% PA sat = 76%, 77% SVC sat = 69%  PFTs 5/21 FEV1 2.59 (99%) FVC 3.17 (95%) DLCO 53%  ONOX: 7/21 1hr 28 min with sats < 88% (lowest 76%)   01/2015- 93% at rest but non walking she desaturate to 84%   CT angio 11/2014 was negative for pulmonary embolism, but showed mosaic pattern suggestive of early interstitial edema >> no response to Lasix CT angio 12/16: No  ILD   Review of systems complete and found to be negative unless listed in HPI.   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  Tobacco Use   Smoking status: Never   Smokeless tobacco: Never  Vaping Use   Vaping status: Never Used  Substance and Sexual Activity   Alcohol use: Yes    Alcohol/week: 0.0 standard drinks of alcohol    Comment: seldom   Drug use: No   Sexual activity: Not on file  Other Topics Concern   Not on file  Social History Narrative   Lives at home with her husband   Right handed   Social Drivers of Health   Financial Resource Strain: Not on file  Food Insecurity: Not on file  Transportation Needs: Not on file  Physical Activity: Not on file  Stress: Not on file  Social Connections: Unknown (05/05/2022)   Received from [facility]   Social Network    Social Network: Not on file  Intimate Partner Violence: Not  At Risk (04/09/2023)   Received from [facility], [facility]   HITS    Over the last 12 months how often did your partner physically hurt you?: Never    Over the last 12 months how often did your partner insult you or talk down to you?: Never    Over the last 12 months how often did your partner threaten you with physical harm?: Never    Over the last 12 months how often did your partner scream or curse at you?: Never    FH:  Family History  Problem Relation Age of Onset   Heart failure Mother        Stent put in 2014 for blockage   COPD Mother    Hypertension Mother    Hyperlipidemia Mother    Heart disease Mother    Thyroid disease Mother    Cancer Mother    Depression Mother    Anxiety disorder Mother    Heart disease Father    Heart disease Son         Repair ASD   Neuropathy Neg Hx     Past Medical History:  Diagnosis Date   Anemia    as child in first grade   Anxiety    Arthritis    Back pain    Constipation    CREST syndrome (HCC)    Dyspnea    Family history of adverse reaction to anesthesia    mom has had problems in past - N/V post op on one occasion; difficult time waking up on another occasion   Gallbladder problem    GERD (gastroesophageal reflux disease)    Gestational diabetes mellitus in childbirth, diet controlled 1987   IBS (irritable bowel syndrome)    Joint pain    Leg edema    Menopause    Osteoarthritis (arthritis due to wear and tear of joints)    PAH (pulmonary artery hypertension) (HCC)    Pulmonary fibrosis (HCC)    Raynaud disease    hands-toes   Scleroderma (HCC)    Shortness of breath dyspnea    pulmonary fibrosis-scleraderma   Sleep apnea    Swallowing difficulty    Thyroid cyst    Vitamin D deficiency    Wears glasses     Current Outpatient Medications  Medication Sig Dispense Refill   alendronate (FOSAMAX) 70 MG tablet Take 70 mg by mouth once a week. Take with a full glass of water on an empty stomach.     baclofen (LIORESAL) 10 MG tablet Take 0.5 tablets (5 mg total) by mouth daily. 30 each 11   buPROPion (WELLBUTRIN XL) 150 MG 24 hr tablet Take 150 mg by mouth daily.     desloratadine (CLARINEX) 5 MG tablet Take 1 tablet by mouth daily.     diclofenac sodium (VOLTAREN) 1 % GEL Apply 2-4 g topically 4 (four) times daily as needed (for knee pain.).     fluticasone (FLONASE) 50 MCG/ACT nasal spray Place 1 spray into both nostrils daily as needed for allergies or rhinitis.      furosemide (LASIX) 40 MG tablet Take 40 mg by mouth daily.     gabapentin (NEURONTIN) 100 MG capsule Take 1 capsule (100 mg total) by mouth at bedtime. 90 capsule 3   hydroxychloroquine (PLAQUENIL) 200 MG tablet Take 200 mg by mouth 2 (two) times daily.     levothyroxine (SYNTHROID) 50 MCG tablet Take 50 mcg by  mouth daily before breakfast.  macitentan (OPSUMIT) 10 MG tablet TAKE 1 TABLET BY MOUTH DAILY. DO NOT HANDLE IF PREGNANT. DO NOT SPLIT, CRUSH, OR CHEW. REVIEW MEDICATION GUIDE. CALL [phone] FOR REFILLS. 30 tablet 11   NON FORMULARY Pt uses a cpap nightly (Patient not taking: Reported on 02/06/2023)     tirzepatide [facility]) 2.5 MG/0.5ML Pen Inject 2.5 mg into the skin once a week.     TRINTELLIX 5 MG TABS tablet Take 5 mg by mouth daily.     No current facility-administered medications for this encounter.    Vitals:   02/27/24 1549  BP: 118/68  Pulse: 75  SpO2: 95%  Weight: 88.6 kg (195 lb 3.7 oz)  Height: 5\' 4"  (1.626 m)     Wt Readings from Last 3 Encounters:  02/27/24 88.6 kg (195 lb 3.7 oz)  03/01/23 85.7 kg (189 lb)  02/06/23 87.2 kg (192 lb 3.2 oz)    PHYSICAL EXAM: General:  Well appearing. No resp difficulty HEENT: normal Neck: supple. no JVD. Carotids 2+ bilat; no bruits. No lymphadenopathy or thryomegaly appreciated. Cor: PMI nondisplaced. Regular rate & rhythm. No rubs, gallops or murmurs. Lungs: clear Abdomen: soft, nontender, nondistended. No hepatosplenomegaly. No bruits or masses. Good bowel sounds. Extremities: no cyanosis, clubbing, rash, edema Neuro: alert & orientedx3, cranial nerves grossly intact. moves all 4 extremities w/o difficulty. Affect pleasant  ASSESSMENT & PLAN:  1. Pulmonary HTN - mild PAH in setting of scleroderma. - RHC 02/2017: PA 61/23 (39), PCW 8. - RHC 2021: PA = 39/14 (25) PCW 10  PVR 2.2  - Echo 8/23 EF 60-65% RV normal RVSP Personally reviewed - RHC 8/23 with mild PAH with elevated PVR & O2 step up between SVC and RA concerning for small L-> R shunt RA 5 PA 54/17 (35) PCW 8 Fick 7.3/3.7 PVR 3.7 WU - NYHA III. Volume ok. Continue current diuretic regimen.  - Continue opsimut 10 mg daily - 2024 TEE EF  No ASD or PFO.   2. Possible shunt physiology on RHC with + bubble on echo - TEE as above. Intrapulmonary shunting  after 6 cycles.   3. Chronic Diastolic HF - Echo 2018: EF 60-65%, RV normal - Echo 2021  EF 60-65% Diastolic function felt to be normal. RV normal  - Echo 8/23 EF 60-65%. RV normal. RVSP 50s.  - NYHA III. Volume status stable.  - Continue lasix 40 mg daily. Consider switch to SGLT2i - Repeat ECHO   4. OSA  - She has not been using CPAP.   - follow with Dr. Vassie Loll   4. Scleroderma - stable No recent flares  5. Chronic respiratory failure with exertional desaturations - Follows with Dr Vassie Loll - No longer using night time oxygen.  - PFTs stable   6. 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 ? Using block . (See below)   Plan to repeat ECHO.   Tonye Becket, NP-C  4:26 PM  Patient seen and examined with the above-signed Advanced Practice Provider and/or Housestaff. I personally reviewed laboratory data, imaging studies and relevant notes. I independently examined the patient and formulated the important aspects of the plan. I have edited the note to reflect any of my changes or salient points. I have personally discussed the plan with the patient and/or family.  58 y/o woman with scleroderma, PAH, OSA, obesity and diastolic HF  Presents today for pre-op risk stratification for upcoming R elbow surgery. Remains SOB  with mild activity. Denies CP. Gets lightheaded at times but no syncope. Edema well controlled. Not wearing CPAP. Has gained weight back .  General:  Obese woman No resp difficulty HEENT: normal + telegenctacias  Neck: supple. no JVD. Carotids 2+ bilat; no bruits. No lymphadenopathy or thryomegaly appreciated. Cor: PMI nondisplaced. Regular rate & rhythm. No rubs, gallops or murmurs. Lungs: clear Abdomen: obese soft, nontender, nondistended. No hepatosplenomegaly. No bruits or masses. Good bowel sounds. Extremities: no cyanosis, clubbing, rash, edema Neuro: alert & orientedx3, cranial nerves  grossly intact. moves all 4 extremities w/o difficulty. Affect pleasant  Given PAH and NYHA III symptoms she is at moderate risk for peri-op CV complications. We discussed this at length. Will repeat echo and if PA pressures stable can proceed to surgery. If possible, would avoid [place] to help mitigate risk.  Once she is done with surgery will need to consider addition of PDE-5i or sotatercept for [facility]   We discussed need for compliance with CPAP. She will d/w Dr. Vassie Loll later this month. She is interested in the [place] device. We also discussed need for weight loss.   I spent a total of 45 minutes today: 1) reviewing the patient's medical records including previous charts, labs and recent notes from other providers; 2) examining the patient and counseling them on their medical issues/explaining the plan of care; 3) adjusting meds as needed and 4) ordering lab work or other needed tests.   Arvilla Meres, MD  6:13 PM

## 2024-02-27 NOTE — Patient Instructions (Addendum)
 Great to see you today!!!  Your physician has requested that you have an echocardiogram. Echocardiography is a painless test that uses sound waves to create images of your heart. It provides your doctor with information about the size and shape of your heart and how well your heart's chambers and valves are working. This procedure takes approximately one hour. There are no restrictions for this procedure. Please do NOT wear cologne, perfume, aftershave, or lotions (deodorant is allowed). Please arrive 15 minutes prior to your appointment time.  Please note: We ask at that you not bring children with you during ultrasound (echo/ vascular) testing. Due to room size and safety concerns, children are not allowed in the ultrasound rooms during exams. Our front office staff cannot provide observation of children in our lobby area while testing is being conducted. An adult accompanying a patient to their appointment will only be allowed in the ultrasound room at the discretion of the ultrasound technician under special circumstances. We apologize for any inconvenience.  Your physician recommends that you schedule a follow-up appointment in: 3 months  Follow up with Dr Vassie Loll regarding sleep apnea   If you have any questions or concerns before your next appointment please send [place] a message through [place] or call our office at [phone].    TO LEAVE A MESSAGE FOR THE NURSE SELECT OPTION 2, PLEASE LEAVE A MESSAGE INCLUDING: YOUR NAME DATE OF BIRTH CALL BACK NUMBER REASON FOR CALL**this is important as we prioritize the call backs  YOU WILL RECEIVE A CALL BACK THE SAME DAY AS LONG AS YOU CALL BEFORE 4:00 PM  At the Advanced Heart Failure Clinic, you and your health needs are our priority. As part of our continuing mission to provide you with exceptional heart care, we have created designated Provider Care Teams. These Care Teams include your primary Cardiologist (physician) and Advanced Practice  Providers (APPs- Physician Assistants and Nurse Practitioners) who all work together to provide you with the care you need, when you need it.   You may see any of the following providers on your designated Care Team at your next follow up: Dr Arvilla Meres Dr Marca Ancona Dr. Dorthula Nettles Dr. Clearnce Hasten Amy Filbert Schilder, NP Robbie Lis, [place] [facility] [place], [place] Brynda Peon, NP [place] Lee, NP Clarisa Kindred, NP Karle Plumber, PharmD Enos Fling, PharmD   Please be sure to bring in all your medications bottles to every appointment.    Thank you for choosing [facility]

## 2024-03-11 ENCOUNTER — Ambulatory Visit (HOSPITAL_COMMUNITY)

## 2024-03-18 ENCOUNTER — Telehealth (HOSPITAL_COMMUNITY): Payer: Self-pay | Admitting: Pharmacist

## 2024-03-18 NOTE — Telephone Encounter (Signed)
 Patient Advocate Encounter   Received notification from [employer] that prior authorization for Opsumit is required.   PA submitted via fax.   Will continue to follow.   Karle Plumber, PharmD, BCPS, BCCP, CPP Heart Failure Clinic Pharmacist [phone]

## 2024-03-18 NOTE — Telephone Encounter (Signed)
 Advanced Heart Failure Patient Advocate Encounter  Prior Authorization for Opsumit has been approved.     Effective dates: 03/18/24 through 03/18/25  Karle Plumber, PharmD, BCPS, BCCP, CPP Heart Failure Clinic Pharmacist [phone]

## 2024-03-23 ENCOUNTER — Ambulatory Visit (HOSPITAL_BASED_OUTPATIENT_CLINIC_OR_DEPARTMENT_OTHER): Payer: BC Managed Care – PPO | Admitting: Pulmonary Disease

## 2024-03-23 ENCOUNTER — Encounter (HOSPITAL_BASED_OUTPATIENT_CLINIC_OR_DEPARTMENT_OTHER): Payer: Self-pay | Admitting: Pulmonary Disease

## 2024-03-23 VITALS — BP 122/68 | HR 74 | Ht 64.0 in | Wt 193.4 lb

## 2024-03-23 DIAGNOSIS — G4733 Obstructive sleep apnea (adult) (pediatric): Secondary | ICD-10-CM

## 2024-03-23 DIAGNOSIS — R5381 Other malaise: Secondary | ICD-10-CM

## 2024-03-23 DIAGNOSIS — I2721 Secondary pulmonary arterial hypertension: Secondary | ICD-10-CM

## 2024-03-23 DIAGNOSIS — Z01811 Encounter for preprocedural respiratory examination: Secondary | ICD-10-CM

## 2024-03-23 DIAGNOSIS — M349 Systemic sclerosis, unspecified: Secondary | ICD-10-CM | POA: Diagnosis not present

## 2024-03-23 NOTE — Patient Instructions (Signed)
 X Ambulatory sat  X Home sleep test Based on this, we can schedule endoscopy for inspire  X handicap application

## 2024-03-23 NOTE — Progress Notes (Signed)
 Subjective:    Patient ID: Rachael Camacho, female    DOB: 12-12-1966, 58 y.o.   MRN: [id number]  HPI  58 yo never smoker for follow-up of pulmonary hypertension, nocturnal hypoxia RHC in 2023 which showed stepup of saturation and echo confirmed PFO with small left-to-right shunt She is maintained on macitentan.   [name] PMH OSA - CPAP was dc'd 08/2015 after HST, CREST syndrome, trigeminal neuralgia,  obesity s/p gastric sleeve 2016 C1-2 fusion   02/2023 TEE  nml EF, no PFO, bubble + after 6 cycles >> intrapulmonary shunting.   The patient, with a history of scleroderma and pulmonary hypertension, presents with increased shortness of breath and fatigue. She reports that these symptoms have worsened recently, with shortness of breath occurring even with minimal exertion such as bending over. The patient also experiences lightheadedness and significant breathlessness after such activities.  In addition to these symptoms, the patient is also dealing with sleep apnea. She has previously used a CPAP machine but discontinued due to discomfort and lack of perceived benefit. The patient reports constant tiredness and lack of energy, which she believes may be related to her sleep apnea.  The patient also mentions a planned shoulder and elbow surgery due to a rotator cuff tear and a loose radial head, respectively. She expresses concern about the proposed plan to remove the radial head and is considering a replacement instead.  Lastly, the patient reports a concerning symptom of vision darkening and feeling like she is going to pass out when straining during bowel movements.  Significant tests/ events reviewed   01/2015 saturation about 93%-on walking she desaturate to 84%   Home sleep study 02/2015 >> AHI 10.6, SaO2 low 70%.  She spent 329.7 min (84.7% of test time) with SaO2 < 90%.  Consistent with mild sleep apnea    09/2022 echo/bubble study >> small PFO with shunt   RHC 07/2022 Mild PAH with  elevated PVR O2 step up between SVC and RA concerning for small L-> R shunt   RA = 5 RV = 53/11 PA = 54/17 (35) PCW = 8 Fick cardiac output/index = 7.3/3.7 PVR = 3.7 WU Ao sat = 96% PA sat = 74%, 74% SVC sat = 68% (65%, 68%, 70%) RA = 79% RV = 76%     RHC 02/2017  showed PA pressure in the 60s, PVR increased to 5.3 WU   Right heart cath >> 02/2015 -right atrial pressure 10, pulmonary capillary wedge pressure 14 and PA pressure 50/19. PVR calculates to 2.7 WU (normal range). PA pressures are up due to high output and pulmonary vasodilators not indicated.       RHC 11/2015 PVR 2.8 Wu   PFT 11/2015 DLCO 58%   PFTs 01/2017  showed lung volumes preserved, DLCO stable at 60% PFTs 04/2020 lung volumes maintained, DLCO decreased from 13.7/60% to 10 0.70/53%     HRCT 08/2023 no ILD, patulous esophagus  HRCT 2016  mild centrilobular groundglass and dilated main pulmonary artery.    HST 02/2021 (watchpat) >> AHI 44/h, low sat 81% HST 08/2015 >> low AHI, desatn > stay on 2L  O2  Review of Systems neg for any significant sore throat, dysphagia, itching, sneezing, nasal congestion or excess/ purulent secretions, fever, chills, sweats, unintended wt loss, pleuritic or exertional cp, hempoptysis, orthopnea pnd or change in chronic leg swelling. Also denies presyncope, palpitations, heartburn, abdominal pain, nausea, vomiting, diarrhea or change in bowel or urinary habits, dysuria,hematuria, rash, arthralgias, visual complaints, headache,  numbness weakness or ataxia.     Objective:   Physical Exam  Gen. Pleasant, obese, in no distress ENT - no lesions, no post nasal drip Neck: No JVD, no thyromegaly, no carotid bruits Lungs: no use of accessory muscles, no dullness to percussion, decreased without rales or rhonchi  Cardiovascular: Rhythm regular, heart sounds  normal, no murmurs or gallops, no peripheral edema Musculoskeletal: No deformities, no cyanosis or clubbing , no tremors        Assessment & Plan:    Pulmonary Hypertension Increased shortness of breath and episodes of near syncope, particularly when bending over or straining, suggest elevated pulmonary artery pressure. Right heart catheterization may be necessary to assess current pressures and guide potential medication adjustments. Sleep apnea can contribute to pulmonary hypertension, typically causing mild elevations. Echocardiogram scheduled to assess pulmonary artery pressures. If pressures are high, a right heart catheterization will be considered to evaluate the need for medication adjustments. - Perform echocardiogram on Thursday to assess pulmonary artery pressures - Consider right heart catheterization based on echocardiogram results - Evaluate for potential medication adjustments post-echocardiogram  OSA Persistent tiredness and difficulty using CPAP due to discomfort and disturbance during sleep. Interested in the [place] device as an alternative treatment. Explained the mechanism, benefits, and risks of the Inspire device, including the surgical implantation process and potential outcomes. Inspire involves implanting a device to stimulate the hypoglossal nerve, opening the airway. Risks include infection, device failure, and the need for battery replacement every 10-12 years. No guarantee of improved energy levels, but it may reduce apnea episodes. Considering Mounjaro for weight loss, which may help with sleep apnea management. Insurance coverage for [facility] for sleep apnea is possible with the correct diagnosis code. - Schedule at-home sleep study to reassess severity of sleep apnea - If sleep study indicates moderate to severe sleep apnea, perform endoscopy/DISE to evaluate airway closure pattern - If criteria are met, refer to surgeon for Inspire device implantation - Discuss [facility] prescription with primary care provider for potential weight loss benefits  Scleroderma Scleroderma is well-managed with  no evidence of interstitial lung disease on recent imaging. Primary concern is the potential impact on pulmonary hypertension.  Fatigue Persistent fatigue and lack of energy, potentially related to sleep apnea, pulmonary hypertension, or other underlying conditions. Inquired about the role of supplements such as magnesium and iron in managing fatigue. Levels should be checked to determine if supplementation is necessary. - Check magnesium and iron levels with primary care provider - Consider vitamin D level assessment if not recently evaluated  Surgical clearance - a Right shoulder scope, hardware removal, possible radial head replacement elbow with interscalene blockon patient  -at moderate risk if pulm hypertension remains high. -await risk, she has follow up with cardiology  Follow-up Multiple ongoing health concerns requiring follow-up and coordination with various specialists. Handicap placard requested due to difficulty with longer distances. - Follow up with primary care provider regarding magnesium, iron, and Mounjaro prescription - Complete echocardiogram on Thursday - Proceed with sleep study and potential Inspire device evaluation - Provide paperwork for handicap placard due to difficulty with longer distances

## 2024-03-25 NOTE — Telephone Encounter (Signed)
 Dr Vassie Loll- based of of the last visit with you on 03/23/24 can you please give risk assessment for the shoulder surgery mentioned below? thanks!

## 2024-03-26 ENCOUNTER — Ambulatory Visit (HOSPITAL_COMMUNITY)
Admission: RE | Admit: 2024-03-26 | Discharge: 2024-03-26 | Disposition: A | Discharge status: Home / Self Care | Source: Ambulatory Visit | Attending: Internal Medicine | Admitting: Internal Medicine

## 2024-03-26 DIAGNOSIS — I358 Other nonrheumatic aortic valve disorders: Secondary | ICD-10-CM | POA: Diagnosis not present

## 2024-03-26 DIAGNOSIS — R0602 Shortness of breath: Secondary | ICD-10-CM | POA: Insufficient documentation

## 2024-03-26 DIAGNOSIS — R0600 Dyspnea, unspecified: Secondary | ICD-10-CM | POA: Diagnosis not present

## 2024-03-26 DIAGNOSIS — I272 Pulmonary hypertension, unspecified: Secondary | ICD-10-CM

## 2024-03-26 DIAGNOSIS — I371 Nonrheumatic pulmonary valve insufficiency: Secondary | ICD-10-CM | POA: Insufficient documentation

## 2024-03-26 LAB — ECHOCARDIOGRAM COMPLETE
AR max vel: 2.55 cm2
AV Area VTI: 2.56 cm2
AV Area mean vel: 2.3 cm2
AV Mean grad: 3 mmHg
AV Peak grad: 5.6 mmHg
Ao pk vel: 1.18 m/s
Area-P 1/2: 4.01 cm2
Calc EF: 68.4 %
MV VTI: 3.18 cm2
S' Lateral: 2.6 cm
Single Plane A2C EF: 73.4 %
Single Plane A4C EF: 67.3 %

## 2024-03-26 NOTE — Progress Notes (Signed)
  Echocardiogram 2D Echocardiogram has been performed.  Ocie Doyne RDCS 03/26/2024, 3:53 PM

## 2024-03-26 NOTE — Telephone Encounter (Signed)
 Oretha Milch, MD to Me  [facility] Surgical Clearance     03/26/24  8:04 AM At moderate risk, addednded my note She is awaiting echo & will need final clearance from dr bensimhon  Copy of this encounter as well as last ov note faxed to Delbert Harness attn Sherri

## 2024-04-08 ENCOUNTER — Encounter

## 2024-04-08 DIAGNOSIS — G4733 Obstructive sleep apnea (adult) (pediatric): Secondary | ICD-10-CM

## 2024-04-21 ENCOUNTER — Telehealth (HOSPITAL_COMMUNITY): Payer: Self-pay | Admitting: *Deleted

## 2024-04-21 NOTE — Telephone Encounter (Signed)
 Dr Julane Ny reviewed echo done on 03/26/14 and completed pre-op risk assessment form:  "Moderate risk, optuimized cardiac"  Form faxed into [facility] at [phone] attn: Mark Sil

## 2024-04-22 ENCOUNTER — Telehealth: Payer: Self-pay | Admitting: Pulmonary Disease

## 2024-04-22 DIAGNOSIS — R069 Unspecified abnormalities of breathing: Secondary | ICD-10-CM | POA: Diagnosis not present

## 2024-04-22 DIAGNOSIS — G4733 Obstructive sleep apnea (adult) (pediatric): Secondary | ICD-10-CM

## 2024-04-22 NOTE — Telephone Encounter (Signed)
 HST showed mild-mod  OSA with AHI 14/ hr & low sat 75%  She does not meet criteria for inspire device O2 drop was significant, CPAP would still be the best option for her (compared to dental appliance) . I know that she was hesitant Would suggest CAP titration study to see if we can make it a better experience/ fix her O2 drop

## 2024-04-23 NOTE — Telephone Encounter (Signed)
 Pt notified and order placed

## 2024-06-14 NOTE — Progress Notes (Signed)
 Advanced Heart Failure Clinic Note   Patient ID: Rachael Camacho, female   DOB: 06-10-1966, 58 y.o.   MRN: [id number] PCP: Primary Cardiologist: Dr Pietro.  Primary HF: Dr Cherrie Pulmonary: Dr Jude.  Rheumatologist: Dr. Leni [facility]   Chief Complaint: Heart Failure and Preoperative Clearance.   HPI: Rachael Camacho is a 58 y/o woman with a OSA, chornic diastolic HF, obesity, scleroderma, PAH and Raynauds disease.    Cath 2016 no CAD  Echo 7/21 EF 60-65% RV normal. TR jet inadequate to assess PA pressures.   Echo 8/23 EF 60-65% RV normal RVSP Personally reviewed  RHC 8/23 RA 5PA 54/17 (35) PCW 8 Fick 7.3/3.7 PVR 7 WU Ao sat = 96% PA sat = 74%, 74% SVC sat = 68% (65%, 68%, 70%) -> O2 step up between SVC and RA concerning for small L-> R shunt RA = 79% RV = 76% Qp/Qs = 0.77  02/2023 TEE EF  Ef 60-65%. RV normal   Echo 4/25 EF 60-65% RV ok RVSP  Remains on macitentan   Saw Dr. Jude recently: HST showed mild-mod  OSA with AHI 14/ hr & low sat 75% She does not meet criteria for inspire device. Recommended CPAP  Here for f/u. Recently had right rotator cuff repair. Still SOB with mild activity. Feels it probably is getting a little worse. Mild LE edema. No syncope/presyncope.   Studies/Tests  2021 RHC  RA = 6 RV = 40/5 PA = 39/14 (25) PCW = 10 Fick cardiac output/index = 6.4/3.2 PVR = 2.2 WU FA sat = 99% PA sat = 76%, 77% SVC sat = 69%  PFTs 5/21 FEV1 2.59 (99%) FVC 3.17 (95%) DLCO 53%  ONOX: 7/21 1hr 28 min with sats < 88% (lowest 76%)   01/2015- 93% at rest but non walking she desaturate to 84%   CT angio 11/2014 was negative for pulmonary embolism, but showed mosaic pattern suggestive of early interstitial edema >> no response to Lasix  CT angio 12/16: No ILD   Review of systems complete and found to be negative unless listed in HPI.   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  Tobacco Use   Smoking status: Never   Smokeless tobacco: Never  Vaping Use   Vaping status: Never Used  Substance and Sexual Activity   Alcohol use: Yes    Alcohol/week: 0.0 standard drinks of alcohol    Comment: seldom   Drug use: No   Sexual activity: Not on file  Other Topics Concern   Not on file  Social History Narrative   Lives at home with her husband   Right handed   Social Drivers of Health   Financial Resource Strain: Not on file  Food Insecurity: Not on file  Transportation Needs: Not on file  Physical Activity: Not on file  Stress: Not on file  Social Connections: Unknown (05/05/2022)   Received from [facility]   Social Network    Social Network: Not on file  Intimate Partner Violence: Not At Risk (04/09/2023)   Received from [facility]   HITS    Over the last 12 months how often did your partner physically hurt you?: Never  Over the last 12 months how often did your partner insult you or talk down to you?: Never    Over the last 12 months how often did your partner threaten you with physical harm?: Never    Over the last 12 months how often did your partner scream or curse at you?: Never    FH:  Family History  Problem Relation Age of Onset   Heart failure Mother        Stent put in 2014 for blockage   COPD Mother    Hypertension Mother    Hyperlipidemia Mother    Heart disease Mother    Thyroid  disease Mother    Cancer Mother    Depression Mother    Anxiety disorder Mother    Heart disease Father    Heart disease Son        Repair ASD   Neuropathy Neg Hx     Past Medical History:  Diagnosis Date   Anemia    as child in first grade   Anxiety    Arthritis    Back pain    Constipation    CREST syndrome (HCC)     Dyspnea    Family history of adverse reaction to anesthesia    mom has had problems in past - N/V post op on one occasion; difficult time waking up on another occasion   Gallbladder problem    GERD (gastroesophageal reflux disease)    Gestational diabetes mellitus in childbirth, diet controlled 1987   IBS (irritable bowel syndrome)    Joint pain    Leg edema    Menopause    Osteoarthritis (arthritis due to wear and tear of joints)    PAH (pulmonary artery hypertension) (HCC)    Pulmonary fibrosis (HCC)    Raynaud disease    hands-toes   Scleroderma (HCC)    Shortness of breath dyspnea    pulmonary fibrosis-scleraderma   Sleep apnea    Swallowing difficulty    Thyroid  cyst    Vitamin D  deficiency    Wears glasses     Current Outpatient Medications  Medication Sig Dispense Refill   alendronate (FOSAMAX) 70 MG tablet Take 70 mg by mouth once a week. Take with a full glass of water on an empty stomach.     baclofen  (LIORESAL ) 10 MG tablet Take 0.5 tablets (5 mg total) by mouth daily. 30 each 11   buPROPion  (WELLBUTRIN  XL) 150 MG 24 hr tablet Take 150 mg by mouth daily.     desloratadine (CLARINEX) 5 MG tablet Take 1 tablet by mouth daily.     diclofenac sodium (VOLTAREN) 1 % GEL Apply 2-4 g topically 4 (four) times daily as needed (for knee pain.).     fluticasone  (FLONASE ) 50 MCG/ACT nasal spray Place 1 spray into both nostrils daily as needed for allergies or rhinitis.      furosemide  (LASIX ) 40 MG tablet Take 40 mg by mouth daily.     gabapentin  (NEURONTIN ) 100 MG capsule Take 1 capsule (100 mg total) by mouth at bedtime. 90 capsule 3   hydroxychloroquine (PLAQUENIL) 200 MG tablet Take 200 mg by mouth 2 (two) times daily.     levothyroxine  (SYNTHROID ) 50 MCG tablet Take 50 mcg by mouth daily before breakfast.     macitentan  (OPSUMIT ) 10 MG tablet TAKE 1 TABLET BY MOUTH DAILY. DO NOT HANDLE IF PREGNANT. DO NOT SPLIT, CRUSH, OR CHEW. REVIEW MEDICATION GUIDE. CALL [phone] FOR  REFILLS. 30 tablet 11  TRINTELLIX 5 MG TABS tablet Take 5 mg by mouth daily.     No current facility-administered medications for this encounter.    Vitals:   06/15/24 1520  BP: (!) 146/82  Pulse: 64  SpO2: 95%  Weight: 88.9 kg (196 lb)      Wt Readings from Last 3 Encounters:  06/15/24 88.9 kg (196 lb)  03/23/24 87.7 kg (193 lb 6.4 oz)  02/27/24 88.6 kg (195 lb 3.7 oz)    PHYSICAL EXAM: General:  Well appearing. No resp difficulty HEENT: normal + telangietascias  Neck: supple. no JVD. Carotids 2+ bilat; no bruits. No lymphadenopathy or thryomegaly appreciated. Cor:  Regular rate & rhythm. No rubs, gallops or murmurs. Lungs: clear Abdomen: obese soft, nontender, nondistended. No hepatosplenomegaly. No bruits or masses. Good bowel sounds. Extremities: no cyanosis, clubbing, rash, + lymphedema RLE > LLE.  RUE in sling Neuro: alert & orientedx3, cranial nerves grossly intact. moves all 4 extremities w/o difficulty. Affect pleasant  ASSESSMENT & PLAN:  1. Pulmonary HTN - mild PAH in setting of scleroderma. - RHC 02/2017: PA 61/23 (39), PCW 8. - RHC 2021: PA = 39/14 (25) PCW 10  PVR 2.2  - Echo 8/23 EF 60-65% RV normal RVSP Personally reviewed - RHC 8/23 with mild PAH with elevated PVR & O2 step up between SVC and RA concerning for small L-> R shunt RA 5 PA 54/17 (35) PCW 8 Fick 7.3/3.7 PVR 3.7 WU - NYHA III volume ok - Continue opsimut 10 mg daily - 2024 TEE EF  No ASD or PFO. - Echo 4/25 EF 60-65% G1 DD RV ok RVSP - Will plan RHC to reassess PAH. Low threshold to start tadalafi +/- sotatercept  2. Possible shunt physiology on RHC with + bubble on echo - TEE as above. Intrapulmonary shunting after 6 cycles.  - CT no evidence of anomalous PV drainage  3. Chronic Diastolic HF - Echo 2018: EF 60-65%, RV normal - Echo 2021  EF 60-65% Diastolic function felt to be normal. RV normal  - Echo 8/23 EF 60-65%. RV normal. RVSP 50s.  - Echo 4/25 EF 60-65% RG1 DDV  ok RVSP - NYHA III. Volume status ok  - Continue lasix  40 mg daily. Consider switch to SGLT2i  4. OSA  - Saw Dr. Jude recently: HST showed mild-mod  OSA with AHI 14/ hr & low sat 75% She does not meet criteria for inspire device. Recommended CPAP  4. Scleroderma - stable No recent flares  5. Chronic respiratory failure with exertional desaturations - Follows with Dr Jude - No longer using night time oxygen.  - PFTs stable   6. 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

## 2024-06-14 NOTE — H&P (View-Only) (Signed)
 Advanced Heart Failure Clinic Note   Patient ID: Rachael Camacho, female   DOB: 06-10-1966, 58 y.o.   MRN: [id number] PCP: Primary Cardiologist: Dr Pietro.  Primary HF: Dr Cherrie Pulmonary: Dr Jude.  Rheumatologist: Dr. Leni [facility]   Chief Complaint: Heart Failure and Preoperative Clearance.   HPI: Rachael Camacho is a 58 y/o woman with a OSA, chornic diastolic HF, obesity, scleroderma, PAH and Raynauds disease.    Cath 2016 no CAD  Echo 7/21 EF 60-65% RV normal. TR jet inadequate to assess PA pressures.   Echo 8/23 EF 60-65% RV normal RVSP Personally reviewed  RHC 8/23 RA 5PA 54/17 (35) PCW 8 Fick 7.3/3.7 PVR 7 WU Ao sat = 96% PA sat = 74%, 74% SVC sat = 68% (65%, 68%, 70%) -> O2 step up between SVC and RA concerning for small L-> R shunt RA = 79% RV = 76% Qp/Qs = 0.77  02/2023 TEE EF  Ef 60-65%. RV normal   Echo 4/25 EF 60-65% RV ok RVSP  Remains on macitentan   Saw Dr. Jude recently: HST showed mild-mod  OSA with AHI 14/ hr & low sat 75% She does not meet criteria for inspire device. Recommended CPAP  Here for f/u. Recently had right rotator cuff repair. Still SOB with mild activity. Feels it probably is getting a little worse. Mild LE edema. No syncope/presyncope.   Studies/Tests  2021 RHC  RA = 6 RV = 40/5 PA = 39/14 (25) PCW = 10 Fick cardiac output/index = 6.4/3.2 PVR = 2.2 WU FA sat = 99% PA sat = 76%, 77% SVC sat = 69%  PFTs 5/21 FEV1 2.59 (99%) FVC 3.17 (95%) DLCO 53%  ONOX: 7/21 1hr 28 min with sats < 88% (lowest 76%)   01/2015- 93% at rest but non walking she desaturate to 84%   CT angio 11/2014 was negative for pulmonary embolism, but showed mosaic pattern suggestive of early interstitial edema >> no response to Lasix  CT angio 12/16: No ILD   Review of systems complete and found to be negative unless listed in HPI.   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  Tobacco Use   Smoking status: Never   Smokeless tobacco: Never  Vaping Use   Vaping status: Never Used  Substance and Sexual Activity   Alcohol use: Yes    Alcohol/week: 0.0 standard drinks of alcohol    Comment: seldom   Drug use: No   Sexual activity: Not on file  Other Topics Concern   Not on file  Social History Narrative   Lives at home with her husband   Right handed   Social Drivers of Health   Financial Resource Strain: Not on file  Food Insecurity: Not on file  Transportation Needs: Not on file  Physical Activity: Not on file  Stress: Not on file  Social Connections: Unknown (05/05/2022)   Received from [facility]   Social Network    Social Network: Not on file  Intimate Partner Violence: Not At Risk (04/09/2023)   Received from [facility]   HITS    Over the last 12 months how often did your partner physically hurt you?: Never  Over the last 12 months how often did your partner insult you or talk down to you?: Never    Over the last 12 months how often did your partner threaten you with physical harm?: Never    Over the last 12 months how often did your partner scream or curse at you?: Never    FH:  Family History  Problem Relation Age of Onset   Heart failure Mother        Stent put in 2014 for blockage   COPD Mother    Hypertension Mother    Hyperlipidemia Mother    Heart disease Mother    Thyroid  disease Mother    Cancer Mother    Depression Mother    Anxiety disorder Mother    Heart disease Father    Heart disease Son        Repair ASD   Neuropathy Neg Hx     Past Medical History:  Diagnosis Date   Anemia    as child in first grade   Anxiety    Arthritis    Back pain    Constipation    CREST syndrome (HCC)     Dyspnea    Family history of adverse reaction to anesthesia    mom has had problems in past - N/V post op on one occasion; difficult time waking up on another occasion   Gallbladder problem    GERD (gastroesophageal reflux disease)    Gestational diabetes mellitus in childbirth, diet controlled 1987   IBS (irritable bowel syndrome)    Joint pain    Leg edema    Menopause    Osteoarthritis (arthritis due to wear and tear of joints)    PAH (pulmonary artery hypertension) (HCC)    Pulmonary fibrosis (HCC)    Raynaud disease    hands-toes   Scleroderma (HCC)    Shortness of breath dyspnea    pulmonary fibrosis-scleraderma   Sleep apnea    Swallowing difficulty    Thyroid  cyst    Vitamin D  deficiency    Wears glasses     Current Outpatient Medications  Medication Sig Dispense Refill   alendronate (FOSAMAX) 70 MG tablet Take 70 mg by mouth once a week. Take with a full glass of water on an empty stomach.     baclofen  (LIORESAL ) 10 MG tablet Take 0.5 tablets (5 mg total) by mouth daily. 30 each 11   buPROPion  (WELLBUTRIN  XL) 150 MG 24 hr tablet Take 150 mg by mouth daily.     desloratadine (CLARINEX) 5 MG tablet Take 1 tablet by mouth daily.     diclofenac sodium (VOLTAREN) 1 % GEL Apply 2-4 g topically 4 (four) times daily as needed (for knee pain.).     fluticasone  (FLONASE ) 50 MCG/ACT nasal spray Place 1 spray into both nostrils daily as needed for allergies or rhinitis.      furosemide  (LASIX ) 40 MG tablet Take 40 mg by mouth daily.     gabapentin  (NEURONTIN ) 100 MG capsule Take 1 capsule (100 mg total) by mouth at bedtime. 90 capsule 3   hydroxychloroquine (PLAQUENIL) 200 MG tablet Take 200 mg by mouth 2 (two) times daily.     levothyroxine  (SYNTHROID ) 50 MCG tablet Take 50 mcg by mouth daily before breakfast.     macitentan  (OPSUMIT ) 10 MG tablet TAKE 1 TABLET BY MOUTH DAILY. DO NOT HANDLE IF PREGNANT. DO NOT SPLIT, CRUSH, OR CHEW. REVIEW MEDICATION GUIDE. CALL [phone] FOR  REFILLS. 30 tablet 11  TRINTELLIX 5 MG TABS tablet Take 5 mg by mouth daily.     No current facility-administered medications for this encounter.    Vitals:   06/15/24 1520  BP: (!) 146/82  Pulse: 64  SpO2: 95%  Weight: 88.9 kg (196 lb)      Wt Readings from Last 3 Encounters:  06/15/24 88.9 kg (196 lb)  03/23/24 87.7 kg (193 lb 6.4 oz)  02/27/24 88.6 kg (195 lb 3.7 oz)    PHYSICAL EXAM: General:  Well appearing. No resp difficulty HEENT: normal + telangietascias  Neck: supple. no JVD. Carotids 2+ bilat; no bruits. No lymphadenopathy or thryomegaly appreciated. Cor:  Regular rate & rhythm. No rubs, gallops or murmurs. Lungs: clear Abdomen: obese soft, nontender, nondistended. No hepatosplenomegaly. No bruits or masses. Good bowel sounds. Extremities: no cyanosis, clubbing, rash, + lymphedema RLE > LLE.  RUE in sling Neuro: alert & orientedx3, cranial nerves grossly intact. moves all 4 extremities w/o difficulty. Affect pleasant  ASSESSMENT & PLAN:  1. Pulmonary HTN - mild PAH in setting of scleroderma. - RHC 02/2017: PA 61/23 (39), PCW 8. - RHC 2021: PA = 39/14 (25) PCW 10  PVR 2.2  - Echo 8/23 EF 60-65% RV normal RVSP Personally reviewed - RHC 8/23 with mild PAH with elevated PVR & O2 step up between SVC and RA concerning for small L-> R shunt RA 5 PA 54/17 (35) PCW 8 Fick 7.3/3.7 PVR 3.7 WU - NYHA III volume ok - Continue opsimut 10 mg daily - 2024 TEE EF  No ASD or PFO. - Echo 4/25 EF 60-65% G1 DD RV ok RVSP - Will plan RHC to reassess PAH. Low threshold to start tadalafi +/- sotatercept  2. Possible shunt physiology on RHC with + bubble on echo - TEE as above. Intrapulmonary shunting after 6 cycles.  - CT no evidence of anomalous PV drainage  3. Chronic Diastolic HF - Echo 2018: EF 60-65%, RV normal - Echo 2021  EF 60-65% Diastolic function felt to be normal. RV normal  - Echo 8/23 EF 60-65%. RV normal. RVSP 50s.  - Echo 4/25 EF 60-65% RG1 DDV  ok RVSP - NYHA III. Volume status ok  - Continue lasix  40 mg daily. Consider switch to SGLT2i  4. OSA  - Saw Dr. Jude recently: HST showed mild-mod  OSA with AHI 14/ hr & low sat 75% She does not meet criteria for inspire device. Recommended CPAP  4. Scleroderma - stable No recent flares  5. Chronic respiratory failure with exertional desaturations - Follows with Dr Jude - No longer using night time oxygen.  - PFTs stable   6. 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

## 2024-06-15 ENCOUNTER — Ambulatory Visit (HOSPITAL_COMMUNITY)
Admission: RE | Admit: 2024-06-15 | Discharge: 2024-06-15 | Disposition: A | Discharge status: Home / Self Care | Source: Ambulatory Visit | Attending: Internal Medicine | Admitting: Internal Medicine

## 2024-06-15 VITALS — BP 146/82 | HR 64 | Wt 196.0 lb

## 2024-06-15 DIAGNOSIS — I5032 Chronic diastolic (congestive) heart failure: Secondary | ICD-10-CM | POA: Diagnosis not present

## 2024-06-15 DIAGNOSIS — M349 Systemic sclerosis, unspecified: Secondary | ICD-10-CM | POA: Insufficient documentation

## 2024-06-15 DIAGNOSIS — J9610 Chronic respiratory failure, unspecified whether with hypoxia or hypercapnia: Secondary | ICD-10-CM | POA: Diagnosis not present

## 2024-06-15 DIAGNOSIS — Z01818 Encounter for other preprocedural examination: Secondary | ICD-10-CM | POA: Diagnosis present

## 2024-06-15 DIAGNOSIS — Z01810 Encounter for preprocedural cardiovascular examination: Secondary | ICD-10-CM | POA: Diagnosis not present

## 2024-06-15 DIAGNOSIS — I272 Pulmonary hypertension, unspecified: Secondary | ICD-10-CM

## 2024-06-15 DIAGNOSIS — G4733 Obstructive sleep apnea (adult) (pediatric): Secondary | ICD-10-CM | POA: Insufficient documentation

## 2024-06-15 DIAGNOSIS — E669 Obesity, unspecified: Secondary | ICD-10-CM | POA: Insufficient documentation

## 2024-06-15 DIAGNOSIS — I7300 Raynaud's syndrome without gangrene: Secondary | ICD-10-CM | POA: Diagnosis not present

## 2024-06-15 DIAGNOSIS — Z9884 Bariatric surgery status: Secondary | ICD-10-CM | POA: Insufficient documentation

## 2024-06-15 LAB — BASIC METABOLIC PANEL WITH GFR
Anion gap: 15 (ref 5–15)
BUN: 16 mg/dL (ref 6–20)
CO2: 21 mmol/L — ABNORMAL LOW (ref 22–32)
Calcium: 8.9 mg/dL (ref 8.9–10.3)
Chloride: 106 mmol/L (ref 98–111)
Creatinine, Ser: 1.09 mg/dL — ABNORMAL HIGH (ref 0.44–1.00)
GFR, Estimated: 59 mL/min — ABNORMAL LOW (ref >60)
Glucose, Bld: 83 mg/dL (ref 70–99)
Potassium: 4.2 mmol/L (ref 3.5–5.1)
Sodium: 142 mmol/L (ref 135–145)

## 2024-06-15 LAB — CBC
HCT: 37.9 % (ref 36.0–46.0)
Hemoglobin: 11.4 g/dL — ABNORMAL LOW (ref 12.0–15.0)
MCH: 27.1 pg (ref 26.0–34.0)
MCHC: 30.1 g/dL (ref 30.0–36.0)
MCV: 90 fL (ref 80.0–100.0)
Platelets: 288 10*3/uL (ref 150–400)
RBC: 4.21 MIL/uL (ref 3.87–5.11)
RDW: 14.6 % (ref 11.5–15.5)
WBC: 7.1 10*3/uL (ref 4.0–10.5)
nRBC: 0 % (ref 0.0–0.2)

## 2024-06-15 NOTE — Patient Instructions (Signed)
 Great to see you today!!!  Heart Catheterization Friday 07/03/24  You are scheduled for a Cardiac Catheterization on Friday, July 11 with Dr. Toribio Fuel.  1. Please arrive at the [facility] (Main Entrance A) at [facility]: [address] [place], [place] [address] at 5:30 AM (This time is 2 hour(s) before your procedure to ensure your preparation).   Free valet parking service is available. You will check in at ADMITTING. The support person will be asked to wait in the waiting room.  It is OK to have someone drop you off and come back when you are ready to be discharged.    Special note: Every effort is made to have your procedure done on time. Please understand that emergencies sometimes delay scheduled procedures.  2. Diet: Do not eat solid foods after midnight.  The patient may have clear liquids until 5am upon the day of the procedure.  3. Labs:DONE TODAY  4. Medication instructions in preparation for your procedure:   Friday 7/11 AM Do NOT TAKE: Furosemide    On the morning of your procedure, take any morning medicines NOT listed above.  You may use sips of water.  5. Plan to go home the same day, you will only stay overnight if medically necessary. 6. Bring a current list of your medications and current insurance cards. 7. You MUST have a responsible person to drive you home. 8. Someone MUST be with you the first 24 hours after you arrive home or your discharge will be delayed. 9. Please wear clothes that are easy to get on and off and wear slip-on shoes.  Thank you for allowing us  to care for you!   -- [facility]   Follow-Up in: 6 months (December), **PLEASE CALL OUR OFFICE IN OCTOBER TO SCHEDULE THIS APPOINTMENT   At the Advanced Heart Failure Clinic, you and your health needs are our priority. We have a designated team specialized in the treatment of Heart Failure. This Care Team includes your primary Heart Failure  Specialized Cardiologist (physician), Advanced Practice Providers (APPs- Physician Assistants and Nurse Practitioners), and Pharmacist who all work together to provide you with the care you need, when you need it.   You may see any of the following providers on your designated Care Team at your next follow up:  Dr. Toribio Fuel Dr. Ezra Shuck Dr. Ria Commander Dr. Odis Brownie Greig Mosses, NP Caffie Shed, [place] [facility] [place], [place] Beckey Coe, NP [place] Lee, NP Tinnie Redman, PharmD   Please be sure to bring in all your medications bottles to every appointment.   Need to Contact Us :  If you have any questions or concerns before your next appointment please send us  a message through [place] or call our office at [phone].    TO LEAVE A MESSAGE FOR THE NURSE SELECT OPTION 2, PLEASE LEAVE A MESSAGE INCLUDING: YOUR NAME DATE OF BIRTH CALL BACK NUMBER REASON FOR CALL**this is important as we prioritize the call backs  YOU WILL RECEIVE A CALL BACK THE SAME DAY AS LONG AS YOU CALL BEFORE 4:00 PM

## 2024-06-16 ENCOUNTER — Other Ambulatory Visit (HOSPITAL_COMMUNITY): Payer: Self-pay | Admitting: *Deleted

## 2024-06-16 DIAGNOSIS — I272 Pulmonary hypertension, unspecified: Secondary | ICD-10-CM

## 2024-06-29 ENCOUNTER — Other Ambulatory Visit: Payer: Self-pay | Admitting: Endocrinology

## 2024-06-29 DIAGNOSIS — E042 Nontoxic multinodular goiter: Secondary | ICD-10-CM

## 2024-06-30 ENCOUNTER — Ambulatory Visit
Admission: RE | Admit: 2024-06-30 | Discharge: 2024-06-30 | Disposition: A | Discharge status: Home / Self Care | Source: Ambulatory Visit | Attending: Endocrinology | Admitting: Endocrinology

## 2024-06-30 DIAGNOSIS — E042 Nontoxic multinodular goiter: Secondary | ICD-10-CM

## 2024-07-01 ENCOUNTER — Other Ambulatory Visit (HOSPITAL_COMMUNITY): Payer: Self-pay | Admitting: Internal Medicine

## 2024-07-03 ENCOUNTER — Encounter (HOSPITAL_COMMUNITY)
Admission: RE | Disposition: A | Discharge status: Home / Self Care | Payer: Self-pay | Source: Home / Self Care | Attending: Internal Medicine

## 2024-07-03 ENCOUNTER — Encounter (HOSPITAL_COMMUNITY): Payer: Self-pay | Admitting: Internal Medicine

## 2024-07-03 ENCOUNTER — Telehealth (HOSPITAL_COMMUNITY): Payer: Self-pay | Admitting: Pharmacy Technician

## 2024-07-03 ENCOUNTER — Other Ambulatory Visit: Payer: Self-pay

## 2024-07-03 ENCOUNTER — Ambulatory Visit (HOSPITAL_COMMUNITY)
Admission: RE | Admit: 2024-07-03 | Discharge: 2024-07-03 | Disposition: A | Discharge status: Home / Self Care | Attending: Internal Medicine | Admitting: Internal Medicine

## 2024-07-03 DIAGNOSIS — E669 Obesity, unspecified: Secondary | ICD-10-CM | POA: Diagnosis not present

## 2024-07-03 DIAGNOSIS — I5032 Chronic diastolic (congestive) heart failure: Secondary | ICD-10-CM | POA: Diagnosis not present

## 2024-07-03 DIAGNOSIS — Z9884 Bariatric surgery status: Secondary | ICD-10-CM | POA: Insufficient documentation

## 2024-07-03 DIAGNOSIS — M349 Systemic sclerosis, unspecified: Secondary | ICD-10-CM | POA: Insufficient documentation

## 2024-07-03 DIAGNOSIS — Z79899 Other long term (current) drug therapy: Secondary | ICD-10-CM | POA: Insufficient documentation

## 2024-07-03 DIAGNOSIS — J9610 Chronic respiratory failure, unspecified whether with hypoxia or hypercapnia: Secondary | ICD-10-CM | POA: Insufficient documentation

## 2024-07-03 DIAGNOSIS — Z6832 Body mass index (BMI) 32.0-32.9, adult: Secondary | ICD-10-CM | POA: Diagnosis not present

## 2024-07-03 DIAGNOSIS — I2721 Secondary pulmonary arterial hypertension: Secondary | ICD-10-CM | POA: Insufficient documentation

## 2024-07-03 DIAGNOSIS — I272 Pulmonary hypertension, unspecified: Secondary | ICD-10-CM

## 2024-07-03 DIAGNOSIS — G4733 Obstructive sleep apnea (adult) (pediatric): Secondary | ICD-10-CM | POA: Diagnosis not present

## 2024-07-03 HISTORY — PX: RIGHT HEART CATH: CATH118263

## 2024-07-03 LAB — POCT I-STAT EG7
Acid-base deficit: 1 mmol/L (ref 0.0–2.0)
Acid-base deficit: 1 mmol/L (ref 0.0–2.0)
Acid-base deficit: 2 mmol/L (ref 0.0–2.0)
Bicarbonate: 23.3 mmol/L (ref 20.0–28.0)
Bicarbonate: 23.8 mmol/L (ref 20.0–28.0)
Bicarbonate: 22.8 mmol/L (ref 20.0–28.0)
Calcium, Ion: 1.19 mmol/L (ref 1.15–1.40)
Calcium, Ion: 1.21 mmol/L (ref 1.15–1.40)
Calcium, Ion: 1.06 mmol/L — ABNORMAL LOW (ref 1.15–1.40)
HCT: 30 % — ABNORMAL LOW (ref 36.0–46.0)
HCT: 30 % — ABNORMAL LOW (ref 36.0–46.0)
HCT: 28 % — ABNORMAL LOW (ref 36.0–46.0)
Hemoglobin: 10.2 g/dL — ABNORMAL LOW (ref 12.0–15.0)
Hemoglobin: 10.2 g/dL — ABNORMAL LOW (ref 12.0–15.0)
Hemoglobin: 9.5 g/dL — ABNORMAL LOW (ref 12.0–15.0)
O2 Saturation: 66 %
O2 Saturation: 70 %
O2 Saturation: 67 %
Potassium: 3.5 mmol/L (ref 3.5–5.1)
Potassium: 3.5 mmol/L (ref 3.5–5.1)
Potassium: 3.2 mmol/L — ABNORMAL LOW (ref 3.5–5.1)
Sodium: 142 mmol/L (ref 135–145)
Sodium: 143 mmol/L (ref 135–145)
Sodium: 146 mmol/L — ABNORMAL HIGH (ref 135–145)
TCO2: 24 mmol/L (ref 22–32)
TCO2: 25 mmol/L (ref 22–32)
TCO2: 24 mmol/L (ref 22–32)
pCO2, Ven: 36.2 mmHg — ABNORMAL LOW (ref 44–60)
pCO2, Ven: 37.6 mmHg — ABNORMAL LOW (ref 44–60)
pCO2, Ven: 37.5 mmHg — ABNORMAL LOW (ref 44–60)
pH, Ven: 7.409 (ref 7.25–7.43)
pH, Ven: 7.417 (ref 7.25–7.43)
pH, Ven: 7.391 (ref 7.25–7.43)
pO2, Ven: 34 mmHg (ref 32–45)
pO2, Ven: 36 mmHg (ref 32–45)
pO2, Ven: 35 mmHg (ref 32–45)

## 2024-07-03 SURGERY — RIGHT HEART CATH
Anesthesia: LOCAL

## 2024-07-03 MED ORDER — SODIUM CHLORIDE 0.9 % IV SOLN: 250.0000 mL | INTRAVENOUS | Status: DC | PRN

## 2024-07-03 MED ORDER — SODIUM CHLORIDE 0.9% FLUSH: 3.0000 mL | Freq: Two times a day (BID) | INTRAVENOUS | Status: DC

## 2024-07-03 MED ORDER — HYDRALAZINE HCL 20 MG/ML IJ SOLN: 10.0000 mg | INTRAMUSCULAR | Status: DC | PRN

## 2024-07-03 MED ORDER — ACETAMINOPHEN 325 MG PO TABS: 650.0000 mg | ORAL_TABLET | ORAL | Status: DC | PRN

## 2024-07-03 MED ORDER — SODIUM CHLORIDE 0.9 % IV SOLN: INTRAVENOUS | Status: DC

## 2024-07-03 MED ORDER — LABETALOL HCL 5 MG/ML IV SOLN: 10.0000 mg | INTRAVENOUS | Status: DC | PRN

## 2024-07-03 MED ORDER — LIDOCAINE HCL (PF) 1 % IJ SOLN
INTRAMUSCULAR | Status: DC | PRN
Start: 2024-07-03 — End: 2024-07-03
  Administered 2024-07-03: 5 mL

## 2024-07-03 MED ORDER — SODIUM CHLORIDE 0.9% FLUSH: 3.0000 mL | INTRAVENOUS | Status: DC | PRN

## 2024-07-03 MED ORDER — ONDANSETRON HCL 4 MG/2ML IJ SOLN: 4.0000 mg | Freq: Four times a day (QID) | INTRAMUSCULAR | Status: DC | PRN

## 2024-07-03 MED ORDER — LIDOCAINE HCL (PF) 1 % IJ SOLN
INTRAMUSCULAR | Status: AC
  Filled 2024-07-03: qty 30

## 2024-07-03 SURGICAL SUPPLY — 6 items
CATH SWAN GANZ 7F STRAIGHT (CATHETERS) IMPLANT
GLIDESHEATH SLENDER 7FR .021G (SHEATH) IMPLANT
KIT MICROPUNCTURE NIT STIFF (SHEATH) IMPLANT
PACK CARDIAC CATHETERIZATION (CUSTOM PROCEDURE TRAY) ×2 IMPLANT
TRANSDUCER W/STOPCOCK (MISCELLANEOUS) IMPLANT
TUBING ART PRESS 72 MALE/FEM (TUBING) IMPLANT

## 2024-07-03 NOTE — Progress Notes (Signed)
 Patient was given discharge instructions. She verbalized understanding.

## 2024-07-03 NOTE — Discharge Instructions (Signed)

## 2024-07-03 NOTE — Interval H&P Note (Signed)
 History and Physical Interval Note:  07/03/2024 8:05 AM  Rachael Camacho  has presented today for surgery, with the diagnosis of hp.  The various methods of treatment have been discussed with the patient and family. After consideration of risks, benefits and other options for treatment, the patient has consented to  Procedure(s): RIGHT HEART CATH (N/A) as a surgical intervention.  The patient's history has been reviewed, patient examined, no change in status, stable for surgery.  I have reviewed the patient's chart and labs.  Questions were answered to the patient's satisfaction.     [name]

## 2024-07-03 NOTE — Telephone Encounter (Signed)
 Advanced Heart Failure Patient Advocate Encounter  Spoke with patient regarding Winrevair referral. Sent to patient via docusign.  Will follow up.

## 2024-07-05 ENCOUNTER — Ambulatory Visit (HOSPITAL_BASED_OUTPATIENT_CLINIC_OR_DEPARTMENT_OTHER): Attending: Pulmonary Disease | Admitting: Pulmonary Disease

## 2024-07-05 DIAGNOSIS — G4733 Obstructive sleep apnea (adult) (pediatric): Secondary | ICD-10-CM | POA: Diagnosis present

## 2024-07-05 DIAGNOSIS — I272 Pulmonary hypertension, unspecified: Secondary | ICD-10-CM | POA: Insufficient documentation

## 2024-07-06 ENCOUNTER — Telehealth (HOSPITAL_COMMUNITY): Payer: Self-pay | Admitting: Pharmacist

## 2024-07-06 ENCOUNTER — Ambulatory Visit (HOSPITAL_COMMUNITY): Payer: Self-pay | Admitting: Pharmacist

## 2024-07-06 ENCOUNTER — Telehealth (HOSPITAL_COMMUNITY): Payer: Self-pay | Admitting: Pharmacy Technician

## 2024-07-06 ENCOUNTER — Other Ambulatory Visit (HOSPITAL_COMMUNITY): Payer: Self-pay

## 2024-07-06 NOTE — Telephone Encounter (Signed)
 RHC was completed for patient by Dr. Cherrie on 07/03/24. Findings showed moderate PAH with progressive increase in PA pressures and PVR and high cardiac output. Plan was originally to start sotatercept, but insurance would like her to try and fail two classes of PAH medications before approving. As of now, she is only on Opsumit .   Spoke with Dr. Bensimhon, will instead start tadalafil  40 mg daily. Can repeat echo and/or RHC later this year after stable on both Opsumit  and tadalafil  and then add sotatercept at that time depending on response. Communicated plan to patient and she expressed understanding. Plan will be to do this for a month and if tolerated will switch patient to Opsynvi to minimize pill burden.   Tinnie Redman, PharmD, BCPS, BCCP, CPP Heart Failure Clinic Pharmacist [phone]

## 2024-07-06 NOTE — Telephone Encounter (Signed)
 Patient Advocate Encounter   Received notification from [employer] that prior authorization for Tadalafil  PAH is required.   PA submitted on CoverMyMeds Key [id number] Status is pending   Will continue to follow.

## 2024-07-06 NOTE — Progress Notes (Signed)
 Attempting to obtain insurance authorization for sotatercept. [employer] stipulates the patient must be on at least two classes of PAH medications before this will be approved. She is on Opsumit  but not on a PDE5i or soluble guanylate cyclase stimulator. Prior chart notes indicated there was a plan previously to start tadalafil  after her TEE, which was completed 03/2024. I did not see where an order was placed. Is it appropriate to start tadalafil  (or sildenafil) now in addition to sotatercept? Or, in your opinion, is there an contraindication to this class of medications that I can document?

## 2024-07-07 ENCOUNTER — Other Ambulatory Visit: Payer: Self-pay

## 2024-07-07 ENCOUNTER — Other Ambulatory Visit (HOSPITAL_COMMUNITY): Payer: Self-pay | Admitting: Pharmacist

## 2024-07-07 ENCOUNTER — Telehealth (HOSPITAL_COMMUNITY): Payer: Self-pay

## 2024-07-07 ENCOUNTER — Telehealth (HOSPITAL_COMMUNITY): Payer: Self-pay | Admitting: Pharmacy Technician

## 2024-07-07 ENCOUNTER — Other Ambulatory Visit (HOSPITAL_COMMUNITY): Payer: Self-pay

## 2024-07-07 ENCOUNTER — Encounter: Payer: Self-pay | Admitting: Pharmacist

## 2024-07-07 MED ORDER — TADALAFIL (PAH) 20 MG PO TABS: 40.0000 mg | ORAL_TABLET | Freq: Every day | ORAL | Status: DC

## 2024-07-07 MED ORDER — TADALAFIL 20 MG PO TABS
40.0000 mg | ORAL_TABLET | Freq: Every day | ORAL | 2 refills | Status: AC
  Filled 2024-07-07: qty 60, 30d supply, fill #0
  Filled 2024-08-02: qty 60, 30d supply, fill #1
  Filled 2024-09-06: qty 60, 30d supply, fill #2

## 2024-07-07 NOTE — Telephone Encounter (Signed)
 Advanced Heart Failure Patient Advocate Encounter  Prior Authorization for Tadalafil  PAH has been approved.    PA# [id number] Effective dates: 07/06/24 through 07/06/25  Patients co-pay is $200 (30 days)  Almarie JULIANNA Pa, CPhT

## 2024-07-07 NOTE — Telephone Encounter (Signed)
 Patient Advocate Encounter   Received notification from [employer] that prior authorization for Winrevair is required.   PA submitted on CoverMyMeds Key [id number] Status is pending   Will continue to follow.

## 2024-07-07 NOTE — Telephone Encounter (Signed)
 Advanced Heart Failure Patient Advocate Encounter  Contacted patient regarding pricing for Tadalafil . Insurance approved copay is $200 per month, and this medication would be cheaper filled with [facility] or through a discount program. Spoke to patient by phone, she is agreeable to receive the medication from [facility] at a discounted price.   I have changed pt preference to delivery, and new rx is being sent in. I have also confirmed information with patient to be used for Opsynvi new start in the future, if needed.  Rachel DEL, CPhT Rx Patient Advocate Phone: [phone]

## 2024-07-08 ENCOUNTER — Other Ambulatory Visit (HOSPITAL_COMMUNITY): Payer: Self-pay

## 2024-07-08 ENCOUNTER — Other Ambulatory Visit: Payer: Self-pay

## 2024-07-09 ENCOUNTER — Ambulatory Visit (HOSPITAL_BASED_OUTPATIENT_CLINIC_OR_DEPARTMENT_OTHER): Admitting: Pulmonary Disease

## 2024-07-09 ENCOUNTER — Encounter (HOSPITAL_BASED_OUTPATIENT_CLINIC_OR_DEPARTMENT_OTHER): Payer: Self-pay | Admitting: Pulmonary Disease

## 2024-07-09 VITALS — BP 121/83 | HR 69 | Ht <= 58 in | Wt 197.0 lb

## 2024-07-09 DIAGNOSIS — G4733 Obstructive sleep apnea (adult) (pediatric): Secondary | ICD-10-CM

## 2024-07-09 DIAGNOSIS — I272 Pulmonary hypertension, unspecified: Secondary | ICD-10-CM | POA: Diagnosis not present

## 2024-07-09 NOTE — Procedures (Signed)
 [facility] [address] [place], [place] [address] Tel: [phone]   Fax: [phone]  Titration Interpretation  Patient Name:  Rachael Camacho, Rachael Camacho Date:  07/06/2024 Referring Physician:  HARDEN Narya Beavin [phone]) %%startinterp%% Indications for Polysomnography The patient is a 58 year-old Female who is 5' 4 and weighs 195.0 lbs. Her BMI equals 33.7.  A full night titration treatment study was performed.  Medication  Tylenol    Tylenol  PM  Desloratidine  Omeprazole  Baclofen   Meloxicam   Gabapentin    Polysomnogram Data A full night polysomnogram recorded the standard physiologic parameters including EEG, EOG, EMG, EKG, nasal and oral airflow.  Respiratory parameters of chest and abdominal movements were recorded with Respiratory Inductance Plethysmography belts.  Oxygen saturation was recorded by pulse oximetry.   Sleep Architecture The total recording time of the polysomnogram was 363.9 minutes.  The total sleep time was 261.0 minutes.  The patient spent 15.1% of total sleep time in Stage N1, 45.8% in Stage N2, 28.4% in Stages N3, and 10.7% in REM.  Sleep latency was 4.7 minutes.  REM latency was 298.0 minutes.  Sleep Efficiency was 71.7%.  Wake after Sleep Onset time was 98.0 minutes.  Titration Summary The patient was titrated at pressures ranging from 5* cm/ up to 13* cm/ -.  The last pressure used in the study was 13* cm/H20. No supplemental oxygen was required. Residual AHI was 34 on 13 cm  Respiratory Events The polysomnogram revealed a presence of 11 obstructive, - central, and - mixed apneas resulting in an Apnea index of 2.5 events per hour.  There were 38 hypopneas (>=3% desaturation and/or arousal) resulting in an Apnea\Hypopnea Index (AHI >=3% desaturation and/or arousal) of 11.3 events per hour.  There were 25 hypopneas (>=4% desaturation) resulting in an Apnea\Hypopnea Index (AHI >=4% desaturation) of 8.3 events per hour.  There were 1  Respiratory Effort Related Arousals resulting in a RERA index of 0.2 events per hour. The Respiratory Disturbance Index is 11.5 events per hour.  The snore index was - events per hour.  Mean oxygen saturation was 94.3%.  The lowest oxygen saturation during sleep was 84.0%.  Time spent <=88% oxygen saturation was 1.6 minutes (0.5%).  Limb Activity There were - limb movements recorded.  Of this total, - were classified as PLMs.  Of the PLMs, - were associated with arousals.  The Limb Movement index was - per hour while the PLM index was - per hour.  Cardiac Summary The average pulse rate was 63.9 bpm.  The minimum pulse rate was 51.0 bpm while the maximum pulse rate was 77.0 bpm.  Cardiac rhythm was normal/abnormal.  Diagnosis: Mild OSA with severe oxygen desaturation due to co-morbid pulmonary hypertension This was partially corrected by CPAP 13 cm, no oxygen was required  Recommendations: Initiate auto CPAP 10-15 cm with nasal mask & chin strap   This study was personally reviewed and electronically signed by: HARDEN Staff, MD Accredited Board Certified in Sleep Medicine

## 2024-07-09 NOTE — Patient Instructions (Addendum)
 Change to autoCPAP 10-15 cm + CPAP supplies   VISIT SUMMARY: Today, we reviewed your recent sleep study results and discussed the management of your pulmonary hypertension. We also talked about your current symptoms and how to improve your treatment plan.  YOUR PLAN: -PULMONARY HYPERTENSION: Pulmonary hypertension is high blood pressure in the arteries of your lungs. Your recent right heart catheterization showed an increase in pressure. We will add tadalafil  to your current medication, macitentan , to help manage this. We may consider a pulmonary rehabilitation program in the future to improve your exercise tolerance. For now, continue to manage your symptoms with rest breaks as needed.  -SLEEP APNEA: Sleep apnea is a condition where your breathing repeatedly stops and starts during sleep. Your recent sleep study showed significant drops in your oxygen levels at night. We will adjust your CPAP settings to auto mode (10-15 cm H2O) and coordinate with a new DME for CPAP supplies and mask fitting. We will schedule a follow-up visit in six months to see how the therapy is working.  INSTRUCTIONS: Please follow up in six months for a post-CPAP visit to assess the efficacy of your therapy. Continue with your current physical therapy program and manage your work schedule around your medical appointments as needed.                      Contains text generated by Abridge.                                 Contains text generated by Abridge.

## 2024-07-09 NOTE — Telephone Encounter (Signed)
 Advanced Heart Failure Patient Advocate Encounter  PA for [name] was denied. Denial reason states that she is only on Opsumit  and not on dual therapy. She is on dual therapy now with the addition of Tadalafil  that insurance has recently approved. Our plan will be to appeal request or resubmit later down the road (to show that patient has been stable on dual therapy).  Denial indexed to chart.  Rachael JULIANNA Pa, CPhT

## 2024-07-09 NOTE — Progress Notes (Signed)
 Subjective:    Patient ID: Rachael Camacho, female    DOB: 1966-11-23, 58 y.o.   MRN: [id number]   58 yo never smoker for follow-up of pulmonary hypertension, nocturnal hypoxia RHC in 2023 which showed stepup of saturation and echo confirmed PFO with small left-to-right shunt She is maintained on macitentan .   . PMH OSA - CPAP was dc'd 08/2015 after HST, CREST syndrome, trigeminal neuralgia,  obesity s/p gastric sleeve 2016 C1-2 fusion     02/2023 TEE  nml EF, no PFO, bubble + after 6 cycles >> intrapulmonary shunting. Discussed the use of AI scribe software for clinical note transcription with the patient, who gave verbal consent to proceed.  History of Present Illness Rachael Camacho is a 58 year old female with scleroderma, pulmonary hypertension, and sleep apnea who presents for evaluation of her sleep study results and management of pulmonary hypertension.  A recent sleep study shows significant oxygen desaturation to 78%. She experiences discomfort sleeping in a bed and uses a recliner. Various CPAP masks have been unsuccessful.  Right heart catheterization indicates an increase in pulmonary pressures from the 50s to the 70s over two years. She is on macitentan  and experiences significant dyspnea on exertion, especially on inclines and stairs, requiring frequent stops.  She participates in physical therapy for four to five weeks and plans to continue for three months. She manages her work schedule around medical appointments, working from home to accommodate rehabilitation and visits.     Significant tests/ events reviewed   01/2015 saturation about 93%-on walking she desaturate to 84%   HST 03/2024 showed mild-mod  OSA with AHI 14/ hr & low sat 75%  CPAP titration 06/2024 nasal mask with chinstrap, CPAP 13>> with residual AHI>> recommend auto CPAP  Home sleep study 02/2015 >> AHI 10.6, SaO2 low 70%.  She spent 329.7 min (84.7% of test time) with SaO2 < 90%.  Consistent with  mild sleep apnea    09/2022 echo/bubble study >> small PFO with shunt    RHC PA 72/25, PCW 10, PVR 4.7 Wood units >> tadalafil  added RHC 07/2022 Mild PAH with elevated PVR O2 step up between SVC and RA concerning for small L-> R shunt   RA = 5 RV = 53/11 PA = 54/17 (35) PCW = 8 Fick cardiac output/index = 7.3/3.7 PVR = 3.7 WU Ao sat = 96% PA sat = 74%, 74% SVC sat = 68% (65%, 68%, 70%) RA = 79% RV = 76%     RHC 02/2017  showed PA pressure in the 60s, PVR increased to 5.3 WU   Right heart cath >> 02/2015 -right atrial pressure 10, pulmonary capillary wedge pressure 14 and PA pressure 50/19. PVR calculates to 2.7 WU (normal range). PA pressures are up due to high output and pulmonary vasodilators not indicated.        RHC 11/2015 PVR 2.8 Wu   PFT 11/2015 DLCO 58%   PFTs 01/2017  showed lung volumes preserved, DLCO stable at 60% PFTs 04/2020 lung volumes maintained, DLCO decreased from 13.7/60% to 10 0.70/53%     HRCT 08/2023 no ILD, patulous esophagus  HRCT 2016  mild centrilobular groundglass and dilated main pulmonary artery.      HST 02/2021 (watchpat) >> AHI 44/h, low sat 81% HST 08/2015 >> low AHI, desatn > stay on 2L  O2  Review of Systems  neg for any significant sore throat, dysphagia, itching, sneezing, nasal congestion or excess/ purulent secretions, fever, chills, sweats, unintended  wt loss, pleuritic or exertional cp, hempoptysis, orthopnea pnd or change in chronic leg swelling. Also denies presyncope, palpitations, heartburn, abdominal pain, nausea, vomiting, diarrhea or change in bowel or urinary habits, dysuria,hematuria, rash, arthralgias, visual complaints, headache, numbness weakness or ataxia.      Objective:   Physical Exam  Gen. Pleasant, obese, in no distress, normal affect ENT - no pallor,icterus, no post nasal drip, class 2-3 airway Neck: No JVD, no thyromegaly, no carotid bruits Lungs: no use of accessory muscles, no dullness to percussion,  decreased without rales or rhonchi  Cardiovascular: Rhythm regular, heart sounds  normal, no murmurs or gallops, no peripheral edema Abdomen: soft and non-tender, no hepatosplenomegaly, BS normal. Musculoskeletal: No deformities, no cyanosis or clubbing Neuro:  alert, non focal, no tremors       Assessment & Plan:   Assessment and Plan Assessment & Plan Pulmonary Hypertension Pulmonary hypertension with increased pressures on recent right heart catheterization, rising from the 50s two years ago to the 70s currently. Symptoms include exertional dyspnea, particularly on inclines and in hot weather. - Add tadalafil  to current macitentan  therapy. - Consider pulmonary rehabilitation program in the future to improve exercise tolerance. - Monitor for need of oxygen during exertion, but she prefers to manage with rest breaks currently.  OSA  Sleep apnea confirmed by recent sleep study, with significant nocturnal desaturation to 78%. CPAP therapy deemed sufficient to manage nocturnal hypoxia without the need for supplemental oxygen. - Adjust CPAP settings to auto mode (10-15 cm H2O). She alreadyhas a machine - Coordinate with a new DME for CPAP supplies and mask fitting. - Schedule a post-CPAP visit in six months to assess therapy efficacy.

## 2024-07-30 NOTE — Telephone Encounter (Signed)
 Advanced Heart Failure Patient Advocate Encounter  Will submit new referral once patient is to start [employer] (there is a new referral form, so previous referral would not be accepted now).  Almarie JULIANNA Pa, CPhT

## 2024-08-03 ENCOUNTER — Other Ambulatory Visit (HOSPITAL_COMMUNITY): Payer: Self-pay

## 2024-09-07 ENCOUNTER — Other Ambulatory Visit (HOSPITAL_COMMUNITY): Payer: Self-pay

## 2024-09-08 ENCOUNTER — Telehealth (INDEPENDENT_AMBULATORY_CARE_PROVIDER_SITE_OTHER): Payer: BC Managed Care – PPO | Admitting: Adult Health

## 2024-09-08 DIAGNOSIS — G500 Trigeminal neuralgia: Secondary | ICD-10-CM

## 2024-09-08 MED ORDER — BACLOFEN 10 MG PO TABS: 5.0000 mg | ORAL_TABLET | Freq: Every day | ORAL | 3 refills | Status: AC

## 2024-09-08 MED ORDER — GABAPENTIN 100 MG PO CAPS: 100.0000 mg | ORAL_CAPSULE | Freq: Every day | ORAL | 3 refills | Status: DC

## 2024-09-08 NOTE — Progress Notes (Signed)
 PATIENT: Rachael Camacho DOB: 18-Sep-1966  REASON FOR VISIT: follow up HISTORY FROM: patient  Virtual Visit via Video Note  I connected with Rachael Camacho on 09/08/24 at  2:30 PM EDT by a video enabled telemedicine application located remotely at [facility] and verified that I am speaking with the correct person using two identifiers who was located at their own home.   I discussed the limitations of evaluation and management by telemedicine and the availability of in person appointments. The patient expressed understanding and agreed to proceed.   PATIENT: Rachael Camacho DOB: 04/07/1966  REASON FOR VISIT: follow up HISTORY FROM: patient  HISTORY OF PRESENT ILLNESS: Today 09/08/24:  Rachael Camacho is a 58 y.o. female with a history of trigeminal neuralgia on the right side. Returns today for follow-up.  Overall she feels that she is doing well.  She feels that her symptoms continue to be well-managed with baclofen  and gabapentin .  She states that she also notices that she cannot sleep without it now.  She has not had any breakthrough symptoms.  She does note that she has been having burning pain behind the right shoulder blade.  She has had neck surgery in the past.  She plans to follow-up with her neck surgeon.  I did advise that we can schedule her for an in office visit if we need to work up a new issue.     09/06/23: Rachael Camacho is a 58 y.o. female with a history of trigeminal neuralgia on the right side. Returns today for follow-up.  Overall she feels that her symptoms have been managed well with baclofen  and gabapentin .  Denies any breakthrough symptoms unless she misses a dose.  Overall doing well   09/11/22: Rachael Camacho is a 58 year old female with a history of trigeminal neuralgia on the right side.  She returns today for follow-up.  Overall she feels that she is doing well.  Baclofen  and gabapentin  continue to work well for her.  She really does  not have any breakthrough symptoms.  She states that she can tell if she misses a dose.  Otherwise she is doing fairly well.  09/05/21: Rachael Camacho is a 58 year old female with a history of trigeminal neuralgia on the right side.  She returns today.  She continues on 5 mg of baclofen  and 100 mg of gabapentin  at bedtime.  She reports that this has been controlling her symptoms.  She states that typical triggers are when she yawns or if she bites on something hard.  Overall she feels her symptoms are well controlled.  She joins me today for virtual visit  HISTORY 08/25/20:   Rachael Camacho is a 58 year old female with a history of trigeminal neuralgia.  She reports that as long as she takes baclofen  and gabapentin  she has been doing well.  Reports that she has been out of her baclofen  for several months.  She reports that discomfort in the right side of the face is slowly coming back.  She reports that on occasion if she yawns she feels pain.  She states there are times she feels that she is getting an earache or if she bites down on food she will have an electric-like pain.  She denies any new symptoms.  Returns today for an evaluation.   REVIEW OF SYSTEMS: Out of a complete 14 system review of symptoms, the patient complains only of the following symptoms, and all other reviewed systems are  negative.  ALLERGIES: Allergies  Allergen Reactions   Amoxicillin Other (See Comments)    Immediate yeast infection    Nsaids     gastric sleeve   Percocet [Oxycodone -Acetaminophen ] Itching    Tolerates acetaminophen  alone   [name] [Nitrofurantoin Monohyd Macro] Rash and Hives   [name] Rash    Reported chicken pox-like rash, not hives   Sulfa Antibiotics Rash and Hives    HOME MEDICATIONS: Outpatient Medications Prior to Visit  Medication Sig Dispense Refill   acetaminophen  (TYLENOL ) 500 MG tablet Take 1,000 mg by mouth every 6 (six) hours as needed for moderate pain (pain score 4-6).      alendronate (FOSAMAX) 70 MG tablet Take 70 mg by mouth every Saturday. Take with a full glass of water on an empty stomach.     Azelastine HCl 137 MCG/SPRAY SOLN Place 1 spray into the nose 2 (two) times daily as needed (allergies).     baclofen  (LIORESAL ) 10 MG tablet Take 0.5 tablets (5 mg total) by mouth daily. 30 each 11   buPROPion  (WELLBUTRIN  XL) 150 MG 24 hr tablet Take 150 mg by mouth daily.     calcium carbonate (TUMS - DOSED IN MG ELEMENTAL CALCIUM) 500 MG chewable tablet Chew 2 tablets by mouth daily as needed for indigestion or heartburn.     desloratadine (CLARINEX) 5 MG tablet Take 1 tablet by mouth daily.     diclofenac sodium (VOLTAREN) 1 % GEL Apply 2-4 g topically 4 (four) times daily as needed (for knee pain.).     diphenhydramine -acetaminophen  (TYLENOL  PM) 25-500 MG TABS tablet Take 1 tablet by mouth at bedtime as needed (sleep).     furosemide  (LASIX ) 40 MG tablet Take 40 mg by mouth daily.     gabapentin  (NEURONTIN ) 100 MG capsule Take 1 capsule (100 mg total) by mouth at bedtime. 90 capsule 3   hydroxychloroquine (PLAQUENIL) 200 MG tablet Take 400 mg by mouth daily.     levothyroxine  (SYNTHROID ) 50 MCG tablet Take 50 mcg by mouth daily before breakfast.     macitentan  (OPSUMIT ) 10 MG tablet TAKE 1 TABLET BY MOUTH 1 TIME A DAY. DO NOT HANDLE IF PREGNANT. DO NOT SPLIT, CRUSH, OR CHEW. 30 tablet 11   meloxicam (MOBIC) 7.5 MG tablet Take 7.5 mg by mouth 2 (two) times daily as needed for pain.     omeprazole (PRILOSEC) 40 MG capsule Take 40 mg by mouth 2 (two) times daily.     tadalafil  (CIALIS ) 20 MG tablet Take 2 tablets (40 mg total) by mouth daily. 60 tablet 2   TRINTELLIX 5 MG TABS tablet Take 5 mg by mouth daily.     No facility-administered medications prior to visit.    PAST MEDICAL HISTORY: Past Medical History:  Diagnosis Date   Anemia    as child in first grade   Anxiety    Arthritis    Back pain    Constipation    CREST syndrome (HCC)    Dyspnea    Family  history of adverse reaction to anesthesia    mom has had problems in past - N/V post op on one occasion; difficult time waking up on another occasion   Gallbladder problem    GERD (gastroesophageal reflux disease)    Gestational diabetes mellitus in childbirth, diet controlled 1987   IBS (irritable bowel syndrome)    Joint pain    Leg edema    Menopause    Osteoarthritis (arthritis due to wear and tear of  joints)    PAH (pulmonary artery hypertension) (HCC)    Pulmonary fibrosis (HCC)    Raynaud disease    hands-toes   Scleroderma (HCC)    Shortness of breath dyspnea    pulmonary fibrosis-scleraderma   Sleep apnea    Swallowing difficulty    Thyroid  cyst    Vitamin D  deficiency    Wears glasses     PAST SURGICAL HISTORY: Past Surgical History:  Procedure Laterality Date   CARDIAC CATHETERIZATION N/A 11/25/2015   Procedure: Right Heart Cath;  Surgeon: Toribio JONELLE Fuel, MD;  Location: [facility] INVASIVE CV LAB;  Service: Cardiovascular;  Laterality: N/A;   CESAREAN SECTION  87,95   CHOLECYSTECTOMY  1996   lap choli   COLONOSCOPY     ESOPHAGOGASTRODUODENOSCOPY (EGD) WITH PROPOFOL  N/A 02/12/2017   Procedure: ESOPHAGOGASTRODUODENOSCOPY (EGD) WITH PROPOFOL ;  Surgeon: Oliva Boots, MD;  Location: [facility] ENDOSCOPY;  Service: Endoscopy;  Laterality: N/A;   HIATAL HERNIA REPAIR  07/26/2015   Procedure: LAPAROSCOPIC REPAIR OF HIATAL HERNIA;  Surgeon: Donnice Lunger, MD;  Location: [facility] ORS;  Service: General;;   LAPAROSCOPIC GASTRIC SLEEVE RESECTION N/A 07/26/2015   Procedure: LAPAROSCOPIC GASTRIC SLEEVE RESECTION;  Surgeon: Donnice Lunger, MD;  Location: [facility] ORS;  Service: General;  Laterality: N/A;   LEFT AND RIGHT HEART CATHETERIZATION WITH CORONARY ANGIOGRAM N/A 03/14/2015   Procedure: LEFT AND RIGHT HEART CATHETERIZATION WITH CORONARY ANGIOGRAM;  Surgeon: Peter M Swaziland, MD;  Location: [facility] CATH LAB;  Service: Cardiovascular;  Laterality: N/A;   POSTERIOR CERVICAL FUSION/FORAMINOTOMY N/A 01/16/2018    Procedure: Cervical one-two Posterior instrumentation and fusion;  Surgeon: Mavis Purchase, MD;  Location: [facility] OR;  Service: Neurosurgery;  Laterality: N/A;   RADIAL HEAD ARTHROPLASTY Right 07/28/2014   Procedure: RIGHT RADIAL HEAD REPLACEMENT;  Surgeon: Donnice Robinsons, MD;  Location:  SURGERY CENTER;  Service: Orthopedics;  Laterality: Right;   RIGHT HEART CATH N/A 03/18/2017   Procedure: Right Heart Cath;  Surgeon: Toribio JONELLE Fuel, MD;  Location: [facility] INVASIVE CV LAB;  Service: Cardiovascular;  Laterality: N/A;   RIGHT HEART CATH N/A 09/27/2020   Procedure: RIGHT HEART CATH;  Surgeon: Fuel Toribio JONELLE, MD;  Location: [facility] INVASIVE CV LAB;  Service: Cardiovascular;  Laterality: N/A;   RIGHT HEART CATH N/A 08/13/2022   Procedure: RIGHT HEART CATH;  Surgeon: Fuel Toribio JONELLE, MD;  Location: [facility] INVASIVE CV LAB;  Service: Cardiovascular;  Laterality: N/A;   RIGHT HEART CATH N/A 07/03/2024   Procedure: RIGHT HEART CATH;  Surgeon: Fuel Toribio JONELLE, MD;  Location: [facility] INVASIVE CV LAB;  Service: Cardiovascular;  Laterality: N/A;   SAVORY DILATION N/A 02/12/2017   Procedure: SAVORY DILATION;  Surgeon: Oliva Boots, MD;  Location: [facility] ENDOSCOPY;  Service: Endoscopy;  Laterality: N/A;   TEE WITHOUT CARDIOVERSION N/A 03/01/2023   Procedure: TRANSESOPHAGEAL ECHOCARDIOGRAM (TEE);  Surgeon: Fuel Toribio JONELLE, MD;  Location: [facility] ENDOSCOPY;  Service: Cardiovascular;  Laterality: N/A;   TUBAL LIGATION     UPPER GI ENDOSCOPY  07/26/2015   Procedure: UPPER GI ENDOSCOPY;  Surgeon: Donnice Lunger, MD;  Location: [facility] ORS;  Service: General;;   VAGINAL HYSTERECTOMY  2010   LAVH    FAMILY HISTORY: Family History  Problem Relation Age of Onset   Heart failure Mother        Stent put in 2014 for blockage   COPD Mother    Hypertension Mother    Hyperlipidemia Mother    Heart disease Mother    Thyroid  disease Mother    Cancer Mother  Depression Mother    Anxiety disorder Mother    Heart disease Father     Heart disease Son        Repair ASD   Neuropathy Neg Hx     SOCIAL HISTORY: Social History   Socioeconomic History   Marital status: Married    Spouse name: W Ameris Akamine   Number of children: 2   Years of education: Not on file   Highest education level: Not on file  Occupational History   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  Tobacco Use   Smoking status: Never   Smokeless tobacco: Never  Vaping Use   Vaping status: Never Used  Substance and Sexual Activity   Alcohol use: Yes    Alcohol/week: 0.0 standard drinks of alcohol    Comment: seldom   Drug use: No   Sexual activity: Not on file  Other Topics Concern   Not on file  Social History Narrative   Lives at home with her husband   Right handed   Social Drivers of Health   Financial Resource Strain: Not on file  Food Insecurity: Not on file  Transportation Needs: Not on file  Physical Activity: Not on file  Stress: Not on file  Social Connections: Unknown (05/05/2022)   Received from [facility]   Social Network    Social Network: Not on file  Intimate Partner Violence: Not At Risk (04/09/2023)   Received from [facility]   HITS    Over the last 12 months how often did your partner physically hurt you?: Never    Over the last 12 months how often did your partner insult you or talk down to you?: Never    Over the last 12 months how often did your partner threaten you with physical harm?: Never    Over the last 12 months how often did your partner scream or curse at you?: Never      PHYSICAL EXAM Generalized: Well developed, in no acute distress   Neurological examination  Mentation: Alert oriented to time, place, history taking. Follows all commands speech and language fluent Cranial nerve II-XII:Extraocular movements were full. Facial symmetry noted.   Reflexes: UTA  DIAGNOSTIC DATA (LABS, IMAGING, TESTING) - I reviewed patient records, labs, notes, testing  and imaging myself where available.  Lab Results  Component Value Date   WBC 7.1 06/15/2024   HGB 9.5 (L) 07/03/2024   HCT 28.0 (L) 07/03/2024   MCV 90.0 06/15/2024   PLT 288 06/15/2024      Component Value Date/Time   NA 146 (H) 07/03/2024 0832   NA 142 05/12/2020 1221   K 3.2 (L) 07/03/2024 0832   CL 106 06/15/2024 1606   CO2 21 (L) 06/15/2024 1606   GLUCOSE 83 06/15/2024 1606   BUN 16 06/15/2024 1606   BUN 15 05/12/2020 1221   CREATININE 1.09 (H) 06/15/2024 1606   CREATININE 0.75 03/08/2015 1501   CALCIUM 8.9 06/15/2024 1606   PROT 6.3 (L) 08/08/2022 1612   PROT 6.5 05/12/2020 1221   ALBUMIN  3.7 08/08/2022 1612   ALBUMIN  4.1 05/12/2020 1221   AST 22 08/08/2022 1612   ALT 16 08/08/2022 1612   ALKPHOS 86 08/08/2022 1612   BILITOT 0.2 (L) 08/08/2022 1612   BILITOT 0.6 05/12/2020 1221   GFRNONAA 59 (L) 06/15/2024 1606   GFRNONAA >89 03/08/2015 1501   GFRAA >60 09/22/2020 1522   GFRAA >89 03/08/2015 1501   Lab Results  Component  Value Date   CHOL 169 05/12/2020   HDL 71 05/12/2020   LDLCALC 82 05/12/2020   TRIG 85 05/12/2020   Lab Results  Component Value Date   HGBA1C 4.8 03/23/2020   Lab Results  Component Value Date   VITAMINB12 433 11/10/2019   Lab Results  Component Value Date   TSH 1.080 05/12/2020      ASSESSMENT AND PLAN 58 y.o. year old female  has a past medical history of Anemia, Anxiety, Arthritis, Back pain, Constipation, CREST syndrome (HCC), Dyspnea, Family history of adverse reaction to anesthesia, Gallbladder problem, GERD (gastroesophageal reflux disease), Gestational diabetes mellitus in childbirth, diet controlled (1987), IBS (irritable bowel syndrome), Joint pain, Leg edema, Menopause, Osteoarthritis (arthritis due to wear and tear of joints), PAH (pulmonary artery hypertension) (HCC), Pulmonary fibrosis (HCC), Raynaud disease, Scleroderma (HCC), Shortness of breath dyspnea, Sleep apnea, Swallowing difficulty, Thyroid  cyst, Vitamin D   deficiency, and Wears glasses. here with:  Trigeminal neuralgia on the right  Continue baclofen  5 mg at bedtime Continue gabapentin  100 mg at bedtime Advised if we need to see her for new symptom of burning behind the shoulder blade we can schedule her for an in office visit.  She plans to follow-up with her surgeon first. Follow-up in 1 year or sooner if needed    Duwaine Russell, MSN, NP-C 09/08/2024, 2:35 PM [facility] [address] [place], [place] [address] [phone]  The patient's condition requires frequent monitoring and adjustments in the treatment plan, reflecting the ongoing complexity of care.  This provider is the continuing focal point for all needed services for this condition.

## 2024-09-10 ENCOUNTER — Other Ambulatory Visit: Payer: Self-pay | Admitting: Adult Health

## 2024-09-10 NOTE — Telephone Encounter (Signed)
 Duplicate last filled 09/08/24 and was picked up 09/09/24

## 2024-10-09 ENCOUNTER — Telehealth: Payer: Self-pay

## 2024-10-09 NOTE — Telephone Encounter (Signed)
 Per MD called pt to confirm appt on 10/20. Appt. LVM for patient to call back and confirm appt.

## 2024-10-12 ENCOUNTER — Inpatient Hospital Stay

## 2024-10-12 ENCOUNTER — Inpatient Hospital Stay: Attending: Hematology and Oncology | Admitting: Hematology and Oncology

## 2024-10-12 VITALS — BP 137/55 | HR 86 | Temp 97.4°F | Resp 16 | Wt 199.4 lb

## 2024-10-12 DIAGNOSIS — M349 Systemic sclerosis, unspecified: Secondary | ICD-10-CM | POA: Diagnosis not present

## 2024-10-12 DIAGNOSIS — R5383 Other fatigue: Secondary | ICD-10-CM | POA: Insufficient documentation

## 2024-10-12 DIAGNOSIS — D509 Iron deficiency anemia, unspecified: Secondary | ICD-10-CM

## 2024-10-12 DIAGNOSIS — Z9884 Bariatric surgery status: Secondary | ICD-10-CM | POA: Insufficient documentation

## 2024-10-12 DIAGNOSIS — R252 Cramp and spasm: Secondary | ICD-10-CM | POA: Insufficient documentation

## 2024-10-12 LAB — CBC WITH DIFFERENTIAL/PLATELET
Abs Immature Granulocytes: 0.01 K/uL (ref 0.00–0.07)
Basophils Absolute: 0 K/uL (ref 0.0–0.1)
Basophils Relative: 1 %
Eosinophils Absolute: 0.1 K/uL (ref 0.0–0.5)
Eosinophils Relative: 2 %
HCT: 30.7 % — ABNORMAL LOW (ref 36.0–46.0)
Hemoglobin: 9.9 g/dL — ABNORMAL LOW (ref 12.0–15.0)
Immature Granulocytes: 0 %
Lymphocytes Relative: 34 %
Lymphs Abs: 1.2 K/uL (ref 0.7–4.0)
MCH: 26.9 pg (ref 26.0–34.0)
MCHC: 32.2 g/dL (ref 30.0–36.0)
MCV: 83.4 fL (ref 80.0–100.0)
Monocytes Absolute: 0.5 K/uL (ref 0.1–1.0)
Monocytes Relative: 14 %
Neutro Abs: 1.7 K/uL (ref 1.7–7.7)
Neutrophils Relative %: 49 %
Platelets: 207 K/uL (ref 150–400)
RBC: 3.68 MIL/uL — ABNORMAL LOW (ref 3.87–5.11)
RDW: 14.5 % (ref 11.5–15.5)
WBC: 3.5 K/uL — ABNORMAL LOW (ref 4.0–10.5)
nRBC: 0 % (ref 0.0–0.2)

## 2024-10-12 LAB — IRON AND IRON BINDING CAPACITY (CC-WL,HP ONLY)
Iron: 36 ug/dL (ref 28–170)
Saturation Ratios: 8 % — ABNORMAL LOW (ref 10.4–31.8)
TIBC: 476 ug/dL — ABNORMAL HIGH (ref 250–450)
UIBC: 440 ug/dL (ref 148–442)

## 2024-10-12 LAB — FERRITIN: Ferritin: 13 ng/mL (ref 11–307)

## 2024-10-12 NOTE — Progress Notes (Signed)
 [facility] CONSULT NOTE  Patient Care Team: Okey Carlin Redbird, MD as PCP - General (Family Medicine)  CHIEF COMPLAINTS/PURPOSE OF CONSULTATION:  IDA  ASSESSMENT & PLAN:   Assessment and Plan Assessment & Plan Iron deficiency anemia in the setting of prior gastric bypass and possible malabsorption Chronic iron deficiency anemia likely due to malabsorption post-gastric bypass. Hemoglobin consistently low, recent drop to 9.5 g/dL. No active GI bleeding. - Order IV iron infusion weekly for three sessions, pending insurance approval. - Request gastroenterologist re-evaluation for potential endoscopic evaluation. - Monitor hemoglobin and iron status in three months. - Discussed adverse effects of iron infusion including but not limited to myalgias, infusion reaction and rarely anaphylaxis.  Fatigue and muscle cramps associated with iron deficiency anemia Fatigue and muscle cramps likely secondary to iron deficiency anemia. - Administer IV iron infusion as planned. - Monitor for improvement in symptoms post-infusion.  Scleroderma Discussed rare possibility of autoimmune flare post-iron infusion. - Proceed with iron infusion, monitor for autoimmune flare.   HISTORY OF PRESENTING ILLNESS:  Rachael Camacho 58 y.o. female is here because of IDA  Discussed the use of AI scribe software for clinical note transcription with the patient, who gave verbal consent to proceed.  History of Present Illness Rachael Camacho is a 58 year old female with iron deficiency anemia who presents for evaluation of her anemia. She was referred by Dr. Okey for evaluation of her anemia.  She has a history of iron deficiency anemia with hemoglobin levels ranging from 10 to 11 over the past two years, recently dropping to 9.5. She has not menstruated since a partial hysterectomy in 2010 or 2012. Due to a gastric bypass surgery 8-9 years ago, she has not tried oral iron supplements. She takes  multivitamins that do not contain iron.  She experiences severe leg cramps two to three times a night, which she describes as 'terrible.' Drinking Gatorade sometimes alleviates the cramps, which have recently settled somewhat. She also experiences symptoms of restless leg syndrome, particularly when sitting in the evening.  She reports chronic diarrhea, managed with Imodium, and has been diagnosed with irritable bowel syndrome, experiencing alternating diarrhea and constipation. She has gained 5-10 pounds, which she attributes to her bowel issues.  She reports increased fatigue, feeling more tired than usual, which she wonders might be related to her anemia. No ice cravings or pica.  All other systems were reviewed with the patient and are negative.  MEDICAL HISTORY:  Past Medical History:  Diagnosis Date   Anemia    as child in first grade   Anxiety    Arthritis    Back pain    Constipation    CREST syndrome (HCC)    Dyspnea    Family history of adverse reaction to anesthesia    mom has had problems in past - N/V post op on one occasion; difficult time waking up on another occasion   Gallbladder problem    GERD (gastroesophageal reflux disease)    Gestational diabetes mellitus in childbirth, diet controlled 1987   IBS (irritable bowel syndrome)    Joint pain    Leg edema    Menopause    Osteoarthritis (arthritis due to wear and tear of joints)    PAH (pulmonary artery hypertension) (HCC)    Pulmonary fibrosis (HCC)    Raynaud disease    hands-toes   Scleroderma (HCC)    Shortness of breath dyspnea    pulmonary fibrosis-scleraderma   Sleep apnea  Swallowing difficulty    Thyroid  cyst    Vitamin D  deficiency    Wears glasses     SURGICAL HISTORY: Past Surgical History:  Procedure Laterality Date   CARDIAC CATHETERIZATION N/A 11/25/2015   Procedure: Right Heart Cath;  Surgeon: Toribio JONELLE Fuel, MD;  Location: [facility] INVASIVE CV LAB;  Service: Cardiovascular;  Laterality:  N/A;   CESAREAN SECTION  87,95   CHOLECYSTECTOMY  1996   lap choli   COLONOSCOPY     ESOPHAGOGASTRODUODENOSCOPY (EGD) WITH PROPOFOL  N/A 02/12/2017   Procedure: ESOPHAGOGASTRODUODENOSCOPY (EGD) WITH PROPOFOL ;  Surgeon: Oliva Boots, MD;  Location: [facility] ENDOSCOPY;  Service: Endoscopy;  Laterality: N/A;   HIATAL HERNIA REPAIR  07/26/2015   Procedure: LAPAROSCOPIC REPAIR OF HIATAL HERNIA;  Surgeon: Donnice Lunger, MD;  Location: [facility] ORS;  Service: General;;   LAPAROSCOPIC GASTRIC SLEEVE RESECTION N/A 07/26/2015   Procedure: LAPAROSCOPIC GASTRIC SLEEVE RESECTION;  Surgeon: Donnice Lunger, MD;  Location: [facility] ORS;  Service: General;  Laterality: N/A;   LEFT AND RIGHT HEART CATHETERIZATION WITH CORONARY ANGIOGRAM N/A 03/14/2015   Procedure: LEFT AND RIGHT HEART CATHETERIZATION WITH CORONARY ANGIOGRAM;  Surgeon: Peter M Swaziland, MD;  Location: [facility] CATH LAB;  Service: Cardiovascular;  Laterality: N/A;   POSTERIOR CERVICAL FUSION/FORAMINOTOMY N/A 01/16/2018   Procedure: Cervical one-two Posterior instrumentation and fusion;  Surgeon: Mavis Purchase, MD;  Location: [facility] OR;  Service: Neurosurgery;  Laterality: N/A;   RADIAL HEAD ARTHROPLASTY Right 07/28/2014   Procedure: RIGHT RADIAL HEAD REPLACEMENT;  Surgeon: Donnice Robinsons, MD;  Location: [facility];  Service: Orthopedics;  Laterality: Right;   RIGHT HEART CATH N/A 03/18/2017   Procedure: Right Heart Cath;  Surgeon: Toribio JONELLE Fuel, MD;  Location: [facility] INVASIVE CV LAB;  Service: Cardiovascular;  Laterality: N/A;   RIGHT HEART CATH N/A 09/27/2020   Procedure: RIGHT HEART CATH;  Surgeon: Fuel Toribio JONELLE, MD;  Location: [facility] INVASIVE CV LAB;  Service: Cardiovascular;  Laterality: N/A;   RIGHT HEART CATH N/A 08/13/2022   Procedure: RIGHT HEART CATH;  Surgeon: Fuel Toribio JONELLE, MD;  Location: [facility] INVASIVE CV LAB;  Service: Cardiovascular;  Laterality: N/A;   RIGHT HEART CATH N/A 07/03/2024   Procedure: RIGHT HEART CATH;  Surgeon: Fuel Toribio JONELLE, MD;   Location: [facility] INVASIVE CV LAB;  Service: Cardiovascular;  Laterality: N/A;   SAVORY DILATION N/A 02/12/2017   Procedure: SAVORY DILATION;  Surgeon: Oliva Boots, MD;  Location: [facility];  Service: Endoscopy;  Laterality: N/A;   TEE WITHOUT CARDIOVERSION N/A 03/01/2023   Procedure: TRANSESOPHAGEAL ECHOCARDIOGRAM (TEE);  Surgeon: Fuel Toribio JONELLE, MD;  Location: [facility] ENDOSCOPY;  Service: Cardiovascular;  Laterality: N/A;   TUBAL LIGATION     UPPER GI ENDOSCOPY  07/26/2015   Procedure: UPPER GI ENDOSCOPY;  Surgeon: Donnice Lunger, MD;  Location: [facility] ORS;  Service: General;;   VAGINAL HYSTERECTOMY  2010   LAVH    SOCIAL HISTORY: Social History   Socioeconomic History   Marital status: Married    Spouse name: W Alois Mincer   Number of children: 2   Years of education: Not on file   Highest education level: Not on file  Occupational History   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  Tobacco Use   Smoking status: Never   Smokeless tobacco: Never  Vaping Use   Vaping status: Never Used  Substance and Sexual Activity   Alcohol use: Yes    Alcohol/week: 0.0 standard drinks of alcohol    Comment: seldom  Drug use: No   Sexual activity: Not on file  Other Topics Concern   Not on file  Social History Narrative   Lives at home with her husband   Right handed   Social Drivers of Health   Financial Resource Strain: Not on file  Food Insecurity: Not on file  Transportation Needs: Not on file  Physical Activity: Not on file  Stress: Not on file  Social Connections: Unknown (05/05/2022)   Received [facility]   Social Network    Social Network: Not on file  Intimate Partner Violence: Not At Risk (04/09/2023)   Received from [facility]   HITS    Over the last 12 months how often did your partner physically hurt you?: Never    Over the last 12 months how often did your partner insult you or talk down to you?: Never    Over the last 12 months  how often did your partner threaten you with physical harm?: Never    Over the last 12 months how often did your partner scream or curse at you?: Never    FAMILY HISTORY: Family History  Problem Relation Age of Onset   Heart failure Mother        Stent put in 2014 for blockage   COPD Mother    Hypertension Mother    Hyperlipidemia Mother    Heart disease Mother    Thyroid  disease Mother    Cancer Mother    Depression Mother    Anxiety disorder Mother    Heart disease Father    Heart disease Son        Repair ASD   Neuropathy Neg Hx     ALLERGIES:  is allergic to amoxicillin, nsaids, percocet [oxycodone -acetaminophen ], macrobid [nitrofurantoin monohyd macro], minocycline, and sulfa antibiotics.  MEDICATIONS:  Current Outpatient Medications  Medication Sig Dispense Refill   acetaminophen  (TYLENOL ) 500 MG tablet Take 1,000 mg by mouth every 6 (six) hours as needed for moderate pain (pain score 4-6).     alendronate (FOSAMAX) 70 MG tablet Take 70 mg by mouth every Saturday. Take with a full glass of water on an empty stomach.     Azelastine HCl 137 MCG/SPRAY SOLN Place 1 spray into the nose 2 (two) times daily as needed (allergies).     baclofen  (LIORESAL ) 10 MG tablet Take 0.5 tablets (5 mg total) by mouth daily. 45 each 3   buPROPion  (WELLBUTRIN  XL) 150 MG 24 hr tablet Take 150 mg by mouth daily.     calcium carbonate (TUMS - DOSED IN MG ELEMENTAL CALCIUM) 500 MG chewable tablet Chew 2 tablets by mouth daily as needed for indigestion or heartburn.     desloratadine (CLARINEX) 5 MG tablet Take 1 tablet by mouth daily.     diclofenac sodium (VOLTAREN) 1 % GEL Apply 2-4 g topically 4 (four) times daily as needed (for knee pain.).     diphenhydramine -acetaminophen  (TYLENOL  PM) 25-500 MG TABS tablet Take 1 tablet by mouth at bedtime as needed (sleep).     furosemide  (LASIX ) 40 MG tablet Take 40 mg by mouth daily.     gabapentin  (NEURONTIN ) 100 MG capsule Take 1 capsule (100 mg total)  by mouth at bedtime. 90 capsule 3   hydroxychloroquine (PLAQUENIL) 200 MG tablet Take 400 mg by mouth daily.     levothyroxine  (SYNTHROID ) 50 MCG tablet Take 50 mcg by mouth daily before breakfast.     macitentan  (OPSUMIT ) 10 MG tablet TAKE 1 TABLET BY MOUTH  1 TIME A DAY. DO NOT HANDLE IF PREGNANT. DO NOT SPLIT, CRUSH, OR CHEW. 30 tablet 11   meloxicam (MOBIC) 7.5 MG tablet Take 7.5 mg by mouth 2 (two) times daily as needed for pain.     omeprazole (PRILOSEC) 40 MG capsule Take 40 mg by mouth 2 (two) times daily.     tadalafil  (CIALIS ) 20 MG tablet Take 2 tablets (40 mg total) by mouth daily. 60 tablet 2   TRINTELLIX 5 MG TABS tablet Take 5 mg by mouth daily.     No current facility-administered medications for this visit.     PHYSICAL EXAMINATION: ECOG PERFORMANCE STATUS: 1 - Symptomatic but completely ambulatory  Vitals:   10/12/24 1111  BP: (!) 137/55  Pulse: 86  Resp: 16  Temp: (!) 97.4 F (36.3 C)  SpO2: 97%   Filed Weights   10/12/24 1111  Weight: 199 lb 6.4 oz (90.4 kg)    GENERAL:alert, no distress and comfortable SKIN: skin color, texture, turgor are normal, no rashes or significant lesions EYES: normal, conjunctiva are pink and non-injected, sclera clear OROPHARYNX:no exudate, no erythema and lips, buccal mucosa, and tongue normal  NECK: supple, thyroid  normal size, non-tender, without nodularity LYMPH:  no palpable lymphadenopathy in the cervical, axillary LUNGS: clear to auscultation and percussion with normal breathing effort HEART: regular rate & rhythm and no murmurs and no lower extremity edema ABDOMEN:abdomen soft, non-tender and normal bowel sounds Musculoskeletal:no cyanosis of digits and no clubbing  PSYCH: alert & oriented x 3 with fluent speech NEURO: no focal motor/sensory deficits  LABORATORY DATA:  I have reviewed the data as listed Lab Results  Component Value Date   WBC 7.1 06/15/2024   HGB 9.5 (L) 07/03/2024   HCT 28.0 (L) 07/03/2024   MCV  90.0 06/15/2024   PLT 288 06/15/2024     Chemistry      Component Value Date/Time   NA 146 (H) 07/03/2024 0832   NA 142 05/12/2020 1221   K 3.2 (L) 07/03/2024 0832   CL 106 06/15/2024 1606   CO2 21 (L) 06/15/2024 1606   BUN 16 06/15/2024 1606   BUN 15 05/12/2020 1221   CREATININE 1.09 (H) 06/15/2024 1606   CREATININE 0.75 03/08/2015 1501   GLU 79 10/03/2018 0000      Component Value Date/Time   CALCIUM 8.9 06/15/2024 1606   ALKPHOS 86 08/08/2022 1612   AST 22 08/08/2022 1612   ALT 16 08/08/2022 1612   BILITOT 0.2 (L) 08/08/2022 1612   BILITOT 0.6 05/12/2020 1221       RADIOGRAPHIC STUDIES: I have personally reviewed the radiological images as listed and agreed with the findings in the report. No results found.  All questions were answered. The patient knows to call the clinic with any problems, questions or concerns. I spent 30 minutes in the care of this patient including H and P, review of records, counseling and coordination of care.     Amber Stalls, MD 10/12/2024 11:13 AM

## 2024-10-13 ENCOUNTER — Ambulatory Visit: Payer: Self-pay | Admitting: Hematology and Oncology

## 2024-10-13 ENCOUNTER — Other Ambulatory Visit: Payer: Self-pay | Admitting: Hematology and Oncology

## 2024-10-13 ENCOUNTER — Telehealth: Payer: Self-pay

## 2024-10-13 NOTE — Telephone Encounter (Signed)
 Dr. Loretha, patient will be scheduled as soon as possible.  Auth Submission: NO AUTH NEEDED Site of care: Site of care: [facility] Payer: [employer] [place]  commercial Medication & CPT/J Code(s) submitted: Feraheme (ferumoxytol) [id number] Diagnosis Code:  Route of submission (phone, fax, portal): phone Phone # [phone] Fax # Auth type: Buy/Bill PB Units/visits requested: 510mg  x 2 doses Reference number: [id number] Approval from: 10/13/24 to 12/23/24

## 2024-10-14 ENCOUNTER — Encounter: Payer: Self-pay | Admitting: Hematology and Oncology

## 2024-10-14 ENCOUNTER — Other Ambulatory Visit (HOSPITAL_COMMUNITY): Payer: Self-pay | Admitting: Pharmacy Technician

## 2024-10-14 NOTE — Telephone Encounter (Signed)
 Called pt. She is scheduled for iron infusions. She knows to call GI for an appt to discuss gut health and to determine if that is the cause of her iron. She is agreeable.

## 2024-10-14 NOTE — Telephone Encounter (Signed)
-----   Message from [place] Iruku sent at 10/13/2024  7:37 PM EDT ----- Ferritin low as expected, so we will proceed with Iron infusions, Please ask the pt to stay in touch with their GI team for further evaluation as well. ----- Message ----- From: Interface, Lab In [place] Sent: 10/12/2024  12:02 PM EDT To: Amber Stalls, MD

## 2024-10-15 ENCOUNTER — Encounter: Payer: Self-pay | Admitting: Hematology and Oncology

## 2024-10-15 ENCOUNTER — Other Ambulatory Visit (HOSPITAL_COMMUNITY): Payer: Self-pay

## 2024-10-20 ENCOUNTER — Ambulatory Visit

## 2024-10-23 ENCOUNTER — Other Ambulatory Visit: Payer: Self-pay | Admitting: Adult Health

## 2024-10-23 ENCOUNTER — Ambulatory Visit

## 2024-10-23 VITALS — BP 127/82 | HR 73 | Temp 97.7°F | Resp 20 | Ht 64.0 in | Wt 201.6 lb

## 2024-10-23 DIAGNOSIS — D509 Iron deficiency anemia, unspecified: Secondary | ICD-10-CM

## 2024-10-23 MED ORDER — SODIUM CHLORIDE 0.9 % IV SOLN
510.0000 mg | Freq: Once | INTRAVENOUS | Status: AC
  Administered 2024-10-23: 510 mg via INTRAVENOUS
  Filled 2024-10-23: qty 17

## 2024-10-23 NOTE — Progress Notes (Signed)
 Diagnosis: Iron Deficiency Anemia  Provider:  Mannam, Praveen MD  Procedure: IV Infusion  IV Type: Peripheral, IV Location: L Antecubital  Feraheme (Ferumoxytol), Dose: 510 mg  Infusion Start Time: 1054  Infusion Stop Time: 1110  Post Infusion IV Care: Observation period completed and Peripheral IV Discontinued  Discharge: Condition: Stable, Destination: Home . AVS Provided  Performed by:  Rocky FORBES Sar, RN

## 2024-10-29 ENCOUNTER — Ambulatory Visit

## 2024-10-29 VITALS — BP 129/82 | HR 73 | Temp 97.6°F | Resp 16 | Ht 64.0 in | Wt 198.2 lb

## 2024-10-29 DIAGNOSIS — D509 Iron deficiency anemia, unspecified: Secondary | ICD-10-CM | POA: Diagnosis not present

## 2024-10-29 MED ORDER — SODIUM CHLORIDE 0.9 % IV SOLN
510.0000 mg | Freq: Once | INTRAVENOUS | Status: AC
  Administered 2024-10-29: 510 mg via INTRAVENOUS
  Filled 2024-10-29: qty 17

## 2024-10-29 NOTE — Progress Notes (Signed)
 Diagnosis: Iron Deficiency Anemia  Provider:  Praveen Mannam MD  Procedure: IV Infusion  IV Type: Peripheral, IV Location: L Antecubital  Feraheme (Ferumoxytol), Dose: 510 mg  Infusion Start Time: 1522  Infusion Stop Time: 1542  Post Infusion IV Care: Observation period completed and Peripheral IV Discontinued  Discharge: Condition: Good, Destination: Home . AVS Declined  Performed by:  Leita FORBES Miles, LPN

## 2024-12-10 ENCOUNTER — Telehealth (HOSPITAL_COMMUNITY): Payer: Self-pay

## 2024-12-10 ENCOUNTER — Ambulatory Visit (HOSPITAL_COMMUNITY)
Admission: RE | Admit: 2024-12-10 | Discharge: 2024-12-10 | Disposition: A | Discharge status: Home / Self Care | Source: Ambulatory Visit | Attending: Internal Medicine | Admitting: Internal Medicine

## 2024-12-10 ENCOUNTER — Telehealth (HOSPITAL_BASED_OUTPATIENT_CLINIC_OR_DEPARTMENT_OTHER): Payer: Self-pay

## 2024-12-10 ENCOUNTER — Encounter (HOSPITAL_COMMUNITY): Payer: Self-pay | Admitting: Internal Medicine

## 2024-12-10 VITALS — BP 110/68 | HR 79 | Ht 64.0 in | Wt 199.0 lb

## 2024-12-10 DIAGNOSIS — G4733 Obstructive sleep apnea (adult) (pediatric): Secondary | ICD-10-CM | POA: Insufficient documentation

## 2024-12-10 DIAGNOSIS — Z9884 Bariatric surgery status: Secondary | ICD-10-CM | POA: Diagnosis not present

## 2024-12-10 DIAGNOSIS — E669 Obesity, unspecified: Secondary | ICD-10-CM | POA: Insufficient documentation

## 2024-12-10 DIAGNOSIS — M349 Systemic sclerosis, unspecified: Secondary | ICD-10-CM | POA: Diagnosis not present

## 2024-12-10 DIAGNOSIS — I7300 Raynaud's syndrome without gangrene: Secondary | ICD-10-CM | POA: Insufficient documentation

## 2024-12-10 DIAGNOSIS — Z8249 Family history of ischemic heart disease and other diseases of the circulatory system: Secondary | ICD-10-CM | POA: Insufficient documentation

## 2024-12-10 DIAGNOSIS — I272 Pulmonary hypertension, unspecified: Secondary | ICD-10-CM | POA: Diagnosis not present

## 2024-12-10 DIAGNOSIS — Z01818 Encounter for other preprocedural examination: Secondary | ICD-10-CM | POA: Diagnosis not present

## 2024-12-10 DIAGNOSIS — Z79899 Other long term (current) drug therapy: Secondary | ICD-10-CM | POA: Diagnosis not present

## 2024-12-10 DIAGNOSIS — I110 Hypertensive heart disease with heart failure: Secondary | ICD-10-CM | POA: Insufficient documentation

## 2024-12-10 DIAGNOSIS — I5032 Chronic diastolic (congestive) heart failure: Secondary | ICD-10-CM | POA: Insufficient documentation

## 2024-12-10 DIAGNOSIS — J9610 Chronic respiratory failure, unspecified whether with hypoxia or hypercapnia: Secondary | ICD-10-CM | POA: Diagnosis not present

## 2024-12-10 NOTE — Addendum Note (Signed)
 Encounter addended by: Zaeem Kandel B, RN on: 12/10/2024 2:59 PM  Actions taken: Clinical Note Signed

## 2024-12-10 NOTE — Progress Notes (Signed)
 Spoke with Dr. Cyndi office (front desk) they are going to send message to Dr. Jude and his nurse to arrange hall walk next week for patient. Advised staff to call patient to arrange. They verbalized understanding.

## 2024-12-10 NOTE — Telephone Encounter (Signed)
 Appt made for pt for next week with Candis    Copied from [facility] #[id number]. Topic: Clinical - Lab/Test Results >> Dec 10, 2024  2:56 PM Rilla B wrote: Reason for CRM: Jenn from [facility], stating patient needs to be scheduled for a hall walk test.... by next week.  Please return call patient at  [phone].

## 2024-12-10 NOTE — Addendum Note (Signed)
 Encounter addended by: Yianna Tersigni R, MD on: 12/10/2024 3:14 PM  Actions taken: Clinical Note Signed

## 2024-12-10 NOTE — Patient Instructions (Addendum)
 Medication Changes:  STOP OPSUMIT    START OPSYNVI 10/40MG  ONCE DAILY   PHARMACY WILL REACH OUT TO YOU REGARDING THIS---DO NOT STOP YOUR OPSUMIT  UNTIL YOU HEAR FROM PHARMACY, OR YOU RECEIVE YOUR NEW MEDICATIONS   PLEASE SCHEDULE 1 WEEK FOLLOW UP FOR HALL WAY WALK WITH DR. CYNDI OFFICE (PULMONARY)  ----DR. ALVA'S OFFICE IS GOING TO CALL YOU TO ARRANGE THIS   Follow-Up in: 3 MONTHS WITH DR. CHERRIE PLEASE CALL OUR OFFICE AROUND MID JANUARY  TO GET SCHEDULED FOR YOUR APPOINTMENT. PHONE NUMBER IS [phone] OPTION 2   At the [facility], you and your health needs are our priority. We have a designated team specialized in the treatment of Heart Failure. This Care Team includes your primary Heart Failure Specialized Cardiologist (physician), Advanced Practice Providers (APPs- Physician Assistants and Nurse Practitioners), and Pharmacist who all work together to provide you with the care you need, when you need it.   You may see any of the following providers on your designated Care Team at your next follow up:  Dr. Toribio Cherrie Dr. Ezra Shuck Dr. Odis Brownie Greig Mosses, NP Caffie Shed, [place] [facility] [place], [place] Beckey Coe, NP Jordan Lee, NP Tinnie Redman, PharmD   Please be sure to bring in all your medications bottles to every appointment.   Need to Contact Us :  If you have any questions or concerns before your next appointment please send us  a message through [facility] or call our office at [phone].    TO LEAVE A MESSAGE FOR THE NURSE SELECT OPTION 2, PLEASE LEAVE A MESSAGE INCLUDING: YOUR NAME DATE OF BIRTH CALL BACK NUMBER REASON FOR CALL**this is important as we prioritize the call backs  YOU WILL RECEIVE A CALL BACK THE SAME DAY AS LONG AS YOU CALL BEFORE 4:00 PM

## 2024-12-10 NOTE — Addendum Note (Signed)
 Encounter addended by: Tita Andriette NOVAK, RN on: 12/10/2024 2:49 PM  Actions taken: Pend clinical note, Clinical Note Signed

## 2024-12-10 NOTE — Progress Notes (Addendum)
 Advanced Heart Failure Clinic Note   Patient ID: Rachael Camacho, female   DOB: 17-Aug-1966, 58 y.o.   MRN: [id number] PCP: Primary Cardiologist: Dr Pietro.  Primary HF: Dr Cherrie Pulmonary: Dr Jude.  Rheumatologist: Dr. Leni [facility]   Chief Complaint: Heart Failure and Preoperative Clearance.   HPI: Rachael Camacho is a 58 y/o woman with a OSA, chornic diastolic HF, obesity, scleroderma, PAH and Raynauds disease.    Cath 2016 no CAD  Echo 7/21 EF 60-65% RV normal. TR jet inadequate to assess PA pressures.   Echo 8/23 EF 60-65% RV normal RVSP Personally reviewed  RHC 8/23 RA 5PA 54/17 (35) PCW 8 Fick 7.3/3.7 PVR 7 WU Ao sat = 96% PA sat = 74%, 74% SVC sat = 68% (65%, 68%, 70%) -> O2 step up between SVC and RA concerning for small L-> R shunt RA = 79% RV = 76% Qp/Qs = 0.77  02/2023 TEE EF  Ef 60-65%. RV normal   Echo 4/25 EF 60-65% RV ok RVSP  HiRes CT 9/24: No ILD  HST showed mild-mod  OSA with AHI 14/ hr & low sat 75% She does not meet criteria for inspire device. Recommended CPAP  RHC 7/25  RA 7 PA 72/25 (44) PCW 10 CO/CI 7.3/3.7 PVR 7.4 WU   Here for f/u. After recent RHC started tadalafil  40. Took for several months but then ran out. Doesn't recall a marked benefit.  Improvement. Gets SOB easily. Very fatigued. Has CPAP machine at home but having trouble tolerating.   Studies/Tests   PFTs 5/21 FEV1 2.59 (99%) FVC 3.17 (95%) DLCO 53%  CT angio 11/2014 was negative for pulmonary embolism, but showed mosaic pattern suggestive of early interstitial edema >> no response to Lasix     Review of systems complete and found to be negative unless listed in HPI.   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  Tobacco Use   Smoking status: Never   Smokeless tobacco: Never  Vaping Use   Vaping status: Never Used  Substance and Sexual Activity   Alcohol use: Yes    Alcohol/week: 0.0 standard drinks of alcohol    Comment: seldom   Drug use: No   Sexual activity: Not on file  Other Topics Concern   Not on file  Social History Narrative   Lives at home with her husband   Right handed   Social Drivers of Health   Tobacco Use: Low Risk (12/10/2024)   Patient History    Smoking Tobacco Use: Never    Smokeless Tobacco Use: Never    Passive Exposure: Not on file  Financial Resource Strain: Not on file  Food Insecurity: No Food Insecurity (10/12/2024)   Epic    Worried About Programme Researcher, Broadcasting/film/video in the Last Year: Never true    Ran Out of Food in the Last Year: Never true  Transportation Needs: No Transportation Needs (10/12/2024)   Epic    Lack of Transportation (Medical): No    Lack of Transportation (Non-Medical): No  Physical Activity: Not on file  Stress: Not on file  Social Connections: Unknown (05/05/2022)   Received from [facility]   Social Network  Social Network: Not on file  Intimate Partner Violence: Not At Risk (10/12/2024)   Epic    Fear of Current or Ex-Partner: No    Emotionally Abused: No    Physically Abused: No    Sexually Abused: No  Depression (PHQ2-9): Low Risk (10/12/2024)   Depression (PHQ2-9)    PHQ-2 Score: 0  Alcohol Screen: Not on file  Housing: Low Risk (10/12/2024)   Epic    Unable to Pay for Housing in the Last Year: No    Number of Times Moved in the Last Year: 0    Homeless in the Last Year: No  Utilities: Not At Risk (10/12/2024)   Epic    Threatened with loss of utilities: No  Health Literacy: Not on file    FH:  Family History  Problem Relation Age of Onset   Heart failure Mother        Stent put in 2014 for blockage   COPD Mother    Hypertension Mother    Hyperlipidemia Mother    Heart  disease Mother    Thyroid  disease Mother    Cancer Mother    Depression Mother    Anxiety disorder Mother    Heart disease Father    Heart disease Son        Repair ASD   Neuropathy Neg Hx     Past Medical History:  Diagnosis Date   Anemia    as child in first grade   Anxiety    Arthritis    Back pain    Constipation    CREST syndrome (HCC)    Dyspnea    Family history of adverse reaction to anesthesia    mom has had problems in past - N/V post op on one occasion; difficult time waking up on another occasion   Gallbladder problem    GERD (gastroesophageal reflux disease)    Gestational diabetes mellitus in childbirth, diet controlled 1987   IBS (irritable bowel syndrome)    Joint pain    Leg edema    Menopause    Osteoarthritis (arthritis due to wear and tear of joints)    PAH (pulmonary artery hypertension) (HCC)    Pulmonary fibrosis (HCC)    Raynaud disease    hands-toes   Scleroderma (HCC)    Shortness of breath dyspnea    pulmonary fibrosis-scleraderma   Sleep apnea    Swallowing difficulty    Thyroid  cyst    Vitamin D  deficiency    Wears glasses     Current Outpatient Medications  Medication Sig Dispense Refill   Azelastine HCl 137 MCG/SPRAY SOLN Place 1 spray into the nose 2 (two) times daily as needed (allergies).     baclofen  (LIORESAL ) 10 MG tablet Take 0.5 tablets (5 mg total) by mouth daily. 45 each 3   buPROPion  (WELLBUTRIN  XL) 150 MG 24 hr tablet Take 150 mg by mouth daily.     calcium carbonate (TUMS - DOSED IN MG ELEMENTAL CALCIUM) 500 MG chewable tablet Chew 2 tablets by mouth daily as needed for indigestion or heartburn.     desloratadine (CLARINEX) 5 MG tablet Take 1 tablet by mouth daily.     diclofenac sodium (VOLTAREN) 1 % GEL Apply 2-4 g topically 4 (four) times daily as needed (for knee pain.).     diphenhydramine -acetaminophen  (TYLENOL  PM) 25-500 MG TABS tablet Take 1 tablet by mouth at bedtime as needed (sleep).     furosemide  (LASIX ) 40  MG tablet Take 40 mg by  mouth daily.     gabapentin  (NEURONTIN ) 100 MG capsule TAKE 1 CAPSULE BY MOUTH AT BEDTIME. 90 capsule 3   levothyroxine  (SYNTHROID ) 50 MCG tablet Take 50 mcg by mouth daily before breakfast.     macitentan  (OPSUMIT ) 10 MG tablet TAKE 1 TABLET BY MOUTH 1 TIME A DAY. DO NOT HANDLE IF PREGNANT. DO NOT SPLIT, CRUSH, OR CHEW. 30 tablet 11   omeprazole (PRILOSEC) 40 MG capsule Take 40 mg by mouth 2 (two) times daily.     tadalafil  (CIALIS ) 20 MG tablet Take 2 tablets (40 mg total) by mouth daily. 60 tablet 2   TRINTELLIX 5 MG TABS tablet Take 5 mg by mouth daily.     No current facility-administered medications for this encounter.    Vitals:   12/10/24 1406  BP: 110/68  Pulse: 79  SpO2: 95%  Weight: 90.3 kg (199 lb)  Height: 5' 4 (1.626 m)      Wt Readings from Last 3 Encounters:  12/10/24 90.3 kg (199 lb)  10/29/24 89.9 kg (198 lb 3.2 oz)  10/23/24 91.4 kg (201 lb 9.6 oz)    PHYSICAL EXAM: General:  Sitting up. No resp difficulty HEENT: normal +tel[place]ias  Neck: supple. no JVD.  Cor: Regular rate & rhythm. No rubs, gallops or murmurs. Lungs: clear Abdomen: obese soft, nontender, nondistended.Good bowel sounds. Extremities: no cyanosis, clubbing, rash, tr edema Neuro: alert & orientedx3, cranial nerves grossly intact. moves all 4 extremities w/o difficulty. Affect pleasant   ASSESSMENT & PLAN:  1. Pulmonary HTN - progressive PAH in setting of scleroderma. - RHC 02/2017: PA 61/23 (39), PCW 8. - RHC 2021: PA = 39/14 (25) PCW 10  PVR 2.2  - Echo 8/23 EF 60-65% RV normal RVSP Personally reviewed - RHC 8/23 with mild PAH with elevated PVR & O2 step up between SVC and RA concerning for small L-> R shunt RA 5 PA 54/17 (35) PCW 8 Fick 7.3/3.7 PVR 3.7 WU - RHC 7/25  RA 7 PA 72/25 (44) PCW 10 CO/CI 7.3/3.7 PVR 7.4 WU  - Stable NYHA III - Switch Opsumit  10 to Opsynvi 10/40. Will likely need sotatercept as well  - 2024 TEE EF  No ASD or PFO. - Echo  4/25 EF 60-65% G1 DD RV ok RVSP - check hall walk - continue to work on CPAP & weight loss  2. Possible shunt physiology on RHC with + bubble on echo - TEE as above. Intrapulmonary shunting after 6 cycles.  - CT no evidence of anomalous PV drainage  3. Chronic Diastolic HF - Echo 2018: EF 60-65%, RV normal - Echo 2021  EF 60-65% Diastolic function felt to be normal. RV normal  - Echo 8/23 EF 60-65%. RV normal. RVSP 50s.  - Echo 4/25 EF 60-65% RG1 DDV ok RVSP - NYHA III - Continue lasix  40 mg daily. Consider switch to SGLT2i  4. OSA  - HST showed mild-mod  OSA with AHI 14/ hr & low sat 75% She does not meet criteria for inspire device.  - working on CPAP  5. Scleroderma - stable No recent flares  5. Chronic respiratory failure with exertional desaturations - Follows with Dr Jude - No longer using night time oxygen.  - PFTs stable  - Hi-res 9/25 No ILD  6. Obesity s/p gastric bypass 07/26/15 - continue weight loss efforts   Addendum: Sats dropped to 84% with hall walk and would not come up to 90% even with 10L O2 (? Poor  reading with scleroderma). Will refer back to Dr. Jude to see if she qualifies for home O2.   Toribio Fuel, MD 2:37 PM

## 2024-12-11 ENCOUNTER — Other Ambulatory Visit (HOSPITAL_COMMUNITY): Payer: Self-pay

## 2024-12-15 ENCOUNTER — Other Ambulatory Visit (HOSPITAL_COMMUNITY): Payer: Self-pay

## 2024-12-15 ENCOUNTER — Telehealth (HOSPITAL_COMMUNITY): Payer: Self-pay

## 2024-12-15 ENCOUNTER — Ambulatory Visit (HOSPITAL_BASED_OUTPATIENT_CLINIC_OR_DEPARTMENT_OTHER)

## 2024-12-15 ENCOUNTER — Telehealth (HOSPITAL_BASED_OUTPATIENT_CLINIC_OR_DEPARTMENT_OTHER): Payer: Self-pay

## 2024-12-15 ENCOUNTER — Encounter (HOSPITAL_BASED_OUTPATIENT_CLINIC_OR_DEPARTMENT_OTHER): Payer: Self-pay

## 2024-12-15 VITALS — BP 112/74 | HR 68 | Ht 64.0 in | Wt 198.0 lb

## 2024-12-15 DIAGNOSIS — M349 Systemic sclerosis, unspecified: Secondary | ICD-10-CM | POA: Diagnosis not present

## 2024-12-15 DIAGNOSIS — I2721 Secondary pulmonary arterial hypertension: Secondary | ICD-10-CM | POA: Diagnosis not present

## 2024-12-15 DIAGNOSIS — Z9989 Dependence on other enabling machines and devices: Secondary | ICD-10-CM | POA: Diagnosis not present

## 2024-12-15 DIAGNOSIS — G4733 Obstructive sleep apnea (adult) (pediatric): Secondary | ICD-10-CM

## 2024-12-15 NOTE — Telephone Encounter (Signed)
 Advanced Heart Failure Patient Advocate Encounter  Prior authorization for Opsynvi 10/40 has been submitted and approved. Test billing returns $200 for 30 day supply.  Key: [id number] Effective: 12/10/2024 to 12/10/2025  Rachel DEL, CPhT Rx Patient Advocate Phone: [phone]

## 2024-12-15 NOTE — Telephone Encounter (Signed)
 Advanced Heart Failure Patient Advocate Encounter  Attempted to contact patient for Opsynvi enrollment forms. Left voicemail.  Rachel DEL, CPhT Rx Patient Advocate Phone: [phone]

## 2024-12-15 NOTE — Telephone Encounter (Signed)
 Spoke with patient by phone, obtained consent to sign. Enrollment and prescription forms faxed to [employer] on 12/15/2024.

## 2024-12-15 NOTE — Patient Instructions (Signed)
 Resume CPAP usage nightly with goal of at least 4-6 hours.  Order placed for new full face mask.  Follow up with Cardiology as scheduled.  Follow up in our office in 2 months for compliance download review.

## 2024-12-15 NOTE — Telephone Encounter (Signed)
 error

## 2024-12-15 NOTE — Progress Notes (Signed)
 "  @Patient  ID: Rachael Camacho, female    DOB: 1966-01-07, 58 y.o.   MRN: [id number]  Chief Complaint  Patient presents with   Follow-up    Referring provider: Okey Carlin Redbird, MD  HPI: Discussed the use of AI scribe software for clinical note transcription with the patient, who gave verbal consent to proceed.  History of Present Illness Rachael Camacho is a 58 year old female with pulmonary arterial hypertension, Raynaud's, and scleroderma who presents with worsening shortness of breath. She was referred by Dr. Bensimhon, her heart failure specialist, for further evaluation of her shortness of breath and concerns of hypoxia.  She has experienced worsening shortness of breath over the past year. Activities such as climbing stairs, walking on inclines, and walking long distances without breaks exacerbate her symptoms. She experiences significant breathlessness and occasional dizziness, especially when performing tasks like carrying her dog up and down stairs multiple times a day.  During a walk test with a finger pulse oximeter at Dr. Nelle office, she reported that her oxygen levels kept dropping significantly. Due to her Raynaud's phenomenon, there was concern about the accuracy of the readings, prompting a repeat test using alternative methods like earlobe or forehead sensors.  Her shortness of breath has progressively worsened, particularly over the last year. She is concerned about potentially needing supplemental oxygen in the future.  She has a history of scleroderma, and she recalls being told that some thickening was noticed early on on CT scan, possibly involving the lining of her lungs.  She also has a history of sleep apnea and has struggled with CPAP therapy. She finds the current nasal cannula mask uncomfortable and has not been able to use it effectively. She previously used a full face mask, which she found more tolerable.  After her last appointment in our office in  July 2025, she did get a new machine and mask.  She wore it once and has not worn it since and reports difficulty tolerating the machine altogether.  Last OV 07/09/2024: Rachael Camacho is a 58 year old female with scleroderma, pulmonary hypertension, and sleep apnea who presents for evaluation of her sleep study results and management of pulmonary hypertension.   A recent sleep study shows significant oxygen desaturation to 78%. She experiences discomfort sleeping in a bed and uses a recliner. Various CPAP masks have been unsuccessful.   Right heart catheterization indicates an increase in pulmonary pressures from the 50s to the 70s over two years. She is on macitentan  and experiences significant dyspnea on exertion, especially on inclines and stairs, requiring frequent stops.   She participates in physical therapy for four to five weeks and plans to continue for three months. She manages her work schedule around medical appointments, working from home to accommodate rehabilitation and visits.       Significant tests/ events reviewed  Office walk test 12/15/2024:  O2 sats remained 95-99% on room air with ambulation and with a forehead sensor.  Heart rates remained in the range of 44-51.   01/2015 saturation about 93%-on walking she desaturate to 84%     HST 03/2024 showed mild-mod  OSA with AHI 14/ hr & low sat 75%  CPAP titration 06/2024 nasal mask with chinstrap, CPAP 13>> with residual AHI>> recommend auto CPAP   Home sleep study 02/2015 >> AHI 10.6, SaO2 low 70%.  She spent 329.7 min (84.7% of test time) with SaO2 < 90%.  Consistent with mild sleep apnea  09/2022 echo/bubble study >> small PFO with shunt     RHC PA 72/25, PCW 10, PVR 4.7 Wood units >> tadalafil  added RHC 07/2022 Mild PAH with elevated PVR O2 step up between SVC and RA concerning for small L-> R shunt   RA = 5 RV = 53/11 PA = 54/17 (35) PCW = 8 Fick cardiac output/index = 7.3/3.7 PVR = 3.7 WU Ao sat = 96% PA  sat = 74%, 74% SVC sat = 68% (65%, 68%, 70%) RA = 79% RV = 76%     RHC 02/2017  showed PA pressure in the 60s, PVR increased to 5.3 WU   Right heart cath >> 02/2015 -right atrial pressure 10, pulmonary capillary wedge pressure 14 and PA pressure 50/19. PVR calculates to 2.7 WU (normal range). PA pressures are up due to high output and pulmonary vasodilators not indicated.        RHC 11/2015 PVR 2.8 Wu   PFT 11/2015 DLCO 58%   PFTs 01/2017  showed lung volumes preserved, DLCO stable at 60% PFTs 04/2020 lung volumes maintained, DLCO decreased from 13.7/60% to 10 0.70/53%     HRCT 08/2023 no ILD, patulous esophagus  HRCT 2016  mild centrilobular groundglass and dilated main pulmonary artery.      HST 02/2021 (watchpat) >> AHI 44/h, low sat 81% HST 08/2015 >> low AHI, desatn > stay on 2L  O2   Allergies[1]  Immunization History  Administered Date(s) Administered   Influenza Inj Mdck Quad Pf 09/24/2018   Influenza Split 09/26/2015   Influenza,inj,Quad PF,6+ Mos 09/26/2017   Influenza-Unspecified 10/20/2014   Moderna Sars-Covid-2 Vaccination 03/20/2020, 04/26/2020   Pfizer(Comirnaty)Fall Seasonal Vaccine 12 years and older 10/07/2023   Pneumococcal Conjugate-13 10/29/2018   Zoster Recombinant(Shingrix) 09/26/2017, 03/02/2018    Past Medical History:  Diagnosis Date   Anemia    as child in first grade   Anxiety    Arthritis    Back pain    Constipation    CREST syndrome (HCC)    Dyspnea    Family history of adverse reaction to anesthesia    mom has had problems in past - N/V post op on one occasion; difficult time waking up on another occasion   Gallbladder problem    GERD (gastroesophageal reflux disease)    Gestational diabetes mellitus in childbirth, diet controlled 1987   IBS (irritable bowel syndrome)    Joint pain    Leg edema    Menopause    Osteoarthritis (arthritis due to wear and tear of joints)    PAH (pulmonary artery hypertension) (HCC)    Pulmonary  fibrosis (HCC)    Raynaud disease    hands-toes   Scleroderma (HCC)    Shortness of breath dyspnea    pulmonary fibrosis-scleraderma   Sleep apnea    Swallowing difficulty    Thyroid  cyst    Vitamin D  deficiency    Wears glasses     Tobacco History: Tobacco Use History[2] Counseling given: Not Answered   Outpatient Medications Prior to Visit  Medication Sig Dispense Refill   Azelastine HCl 137 MCG/SPRAY SOLN Place 1 spray into the nose 2 (two) times daily as needed (allergies).     baclofen  (LIORESAL ) 10 MG tablet Take 0.5 tablets (5 mg total) by mouth daily. 45 each 3   buPROPion  (WELLBUTRIN  XL) 150 MG 24 hr tablet Take 150 mg by mouth daily.     calcium carbonate (TUMS - DOSED IN MG ELEMENTAL CALCIUM) 500 MG chewable tablet Chew 2  tablets by mouth daily as needed for indigestion or heartburn.     desloratadine (CLARINEX) 5 MG tablet Take 1 tablet by mouth daily.     diclofenac sodium (VOLTAREN) 1 % GEL Apply 2-4 g topically 4 (four) times daily as needed (for knee pain.).     diphenhydramine -acetaminophen  (TYLENOL  PM) 25-500 MG TABS tablet Take 1 tablet by mouth at bedtime as needed (sleep).     furosemide  (LASIX ) 40 MG tablet Take 40 mg by mouth daily.     gabapentin  (NEURONTIN ) 100 MG capsule TAKE 1 CAPSULE BY MOUTH AT BEDTIME. 90 capsule 3   levothyroxine  (SYNTHROID ) 50 MCG tablet Take 50 mcg by mouth daily before breakfast.     macitentan  (OPSUMIT ) 10 MG tablet TAKE 1 TABLET BY MOUTH 1 TIME A DAY. DO NOT HANDLE IF PREGNANT. DO NOT SPLIT, CRUSH, OR CHEW. 30 tablet 11   omeprazole (PRILOSEC) 40 MG capsule Take 40 mg by mouth 2 (two) times daily.     tadalafil  (CIALIS ) 20 MG tablet Take 2 tablets (40 mg total) by mouth daily. 60 tablet 2   TRINTELLIX 5 MG TABS tablet Take 5 mg by mouth daily.     No facility-administered medications prior to visit.     Review of Systems: as per hpi  Constitutional:   No  weight loss, night sweats,  Fevers, chills, fatigue, or   lassitude.  HEENT:   No headaches,  Difficulty swallowing,  Tooth/dental problems, or  Sore throat,                No sneezing, itching, ear ache, nasal congestion, post nasal drip,   CV:  No chest pain,  Orthopnea, PND, swelling in lower extremities, anasarca, dizziness, palpitations, syncope.   GI  No heartburn, indigestion, abdominal pain, nausea, vomiting, diarrhea, change in bowel habits, loss of appetite, bloody stools.   Resp: No shortness of breath with exertion or at rest.  No excess mucus, no productive cough,  No non-productive cough,  No coughing up of blood.  No change in color of mucus.  No wheezing.  No chest wall deformity  Skin: no rash or lesions.  GU: no dysuria, change in color of urine, no urgency or frequency.  No flank pain, no hematuria   MS:  No joint pain or swelling.  No decreased range of motion.  No back pain.    Physical Exam  BP 112/74   Pulse 68   Ht 5' 4 (1.626 m)   Wt 198 lb (89.8 kg)   SpO2 91%   BMI 33.99 kg/m   GEN: A/Ox3; pleasant , NAD, well nourished    HEENT:  [place]/AT,  EACs-clear, TMs-wnl, NOSE-clear, THROAT-clear, no lesions, no postnasal drip or exudate noted.   NECK:  Supple w/ fair ROM; no JVD; normal carotid impulses w/o bruits; no thyromegaly or nodules palpated; no lymphadenopathy.    RESP  Clear  P & A; w/o, wheezes/ rales/ or rhonchi. no accessory muscle use, no dullness to percussion  CARD:  RRR, no m/r/g, no peripheral edema, pulses intact, no cyanosis or clubbing.  GI:   Soft & nt; nml bowel sounds; no organomegaly or masses detected.   Musco: Warm bil, no deformities or joint swelling noted.   Neuro: alert, no focal deficits noted.    Skin: Warm, no lesions or rashes    Lab Results:  CBC    Component Value Date/Time   WBC 3.5 (L) 10/12/2024 1142   RBC 3.68 (L) 10/12/2024 1142  HGB 9.9 (L) 10/12/2024 1142   HGB 13.3 03/23/2020 1330   HCT 30.7 (L) 10/12/2024 1142   HCT 41.1 03/23/2020 1330   PLT 207  10/12/2024 1142   PLT 264 03/23/2020 1330   MCV 83.4 10/12/2024 1142   MCV 89 03/23/2020 1330   MCH 26.9 10/12/2024 1142   MCHC 32.2 10/12/2024 1142   RDW 14.5 10/12/2024 1142   RDW 12.3 03/23/2020 1330   LYMPHSABS 1.2 10/12/2024 1142   LYMPHSABS 1.9 03/23/2020 1330   MONOABS 0.5 10/12/2024 1142   EOSABS 0.1 10/12/2024 1142   EOSABS 0.2 03/23/2020 1330   BASOSABS 0.0 10/12/2024 1142   BASOSABS 0.1 03/23/2020 1330    BMET    Component Value Date/Time   NA 146 (H) 07/03/2024 0832   NA 142 05/12/2020 1221   K 3.2 (L) 07/03/2024 0832   CL 106 06/15/2024 1606   CO2 21 (L) 06/15/2024 1606   GLUCOSE 83 06/15/2024 1606   BUN 16 06/15/2024 1606   BUN 15 05/12/2020 1221   CREATININE 1.09 (H) 06/15/2024 1606   CREATININE 0.75 03/08/2015 1501   CALCIUM 8.9 06/15/2024 1606   GFRNONAA 59 (L) 06/15/2024 1606   GFRNONAA >89 03/08/2015 1501   GFRAA >60 09/22/2020 1522   GFRAA >89 03/08/2015 1501    BNP    Component Value Date/Time   BNP 128.0 (H) 08/08/2022 1612   BNP 20.4 11/15/2014 1428    ProBNP No results found for: PROBNP  Imaging: No results found.  ferumoxytol  (FERAHEME ) 510 mg in sodium chloride  0.9 % 100 mL IVPB     Date Action Dose Route User   10/23/2024 1054 New Bag/Given 510 mg Intravenous Janit Rocky BRAVO, RN      ferumoxytol  (FERAHEME ) 510 mg in sodium chloride  0.9 % 100 mL IVPB     Date Action Dose Route User   10/29/2024 1522 New Bag/Given 510 mg Intravenous Batten, Laura E, LPN          Latest Ref Rng & Units 04/29/2020    4:00 PM 02/08/2017   12:53 PM 10/25/2015   10:53 AM 03/09/2015    3:06 PM  PFT Results  FVC-Pre L 3.17  3.42  3.29  2.85  P  FVC-Predicted Pre % 95  101  96  79  P  FVC-Post L 3.03  3.36  3.17  2.87  P  FVC-Predicted Post % 91  99  93  79  P  Pre FEV1/FVC % % 82  83  84  83  P  Post FEV1/FCV % % 86  87  87  88  P  FEV1-Pre L 2.59  2.83  2.75  2.37  P  FEV1-Predicted Pre % 99  106  101  83  P  FEV1-Post L 2.62  2.92  2.75   2.53  P  DLCO uncorrected ml/min/mmHg 10.70  13.73  13.29  13.15  P  DLCO UNC% % 53  59  58  52  P  DLCO corrected ml/min/mmHg 10.70  13.81     DLCO COR %Predicted % 53  60     DLVA Predicted % 56  64  66  67  P  TLC L 4.62  4.52  4.69  4.02  P  TLC % Predicted % 94  92  95  78  P  RV % Predicted % 86  68  73  62  P    P Preliminary result    No results found for: NITRICOXIDE  Assessment & Plan:  Rachael Camacho is a 58 year old female with pulmonary arterial hypertension, Raynaud's, and scleroderma who presents with worsening shortness of breath. She was referred by Dr. Bensimhon, her heart failure specialist, for further evaluation of her shortness of breath and concerns of hypoxia.  Shortness of breath likely multifactorial in the setting of uncontrolled OSA, PAH, and mild bradycardia.  Potential of scleroderma to affect lungs; Chest CT from 08/2023 without scleroderma/ILD related findings.  Assessment & Plan OSA (obstructive sleep apnea)  PAH (pulmonary artery hypertension) (HCC)  Scleroderma (HCC)  Assessment and Plan Assessment & Plan Pulmonary arterial hypertension Progressive dyspnea on exertion. Oxygen levels stable during current walk test. Heart rate in upper forties to low fifties potentially medication related.  - Supplemental oxygen not indicated. - Monitor heart rate as it may contribute to symptoms of dyspnea.   Obstructive sleep apnea Mild to moderate obstructive sleep apnea. Difficulty using CPAP with nasal cannula mask. Full face mask previously more tolerable. Untreated sleep apnea may contribute to heart condition progression and shortness of breath. Not a candidate for Inspire therapy. - Ordered new CPAP supplies, specifically a full face mask. - Encouraged use of CPAP with full face mask with a goal of at least 4-6 hours of usage per night. - Scheduled follow-up in February to assess CPAP compliance   Scleroderma Potential lung impact, however,  previous CT from 08/2023 showed no significant changes. Progressive dyspnea warrants consideration of updated imaging if no improvement after CPAP adherence. - Will consider updating chest CT if dyspnea persists or worsens.  Return in about 2 months (around 02/15/2025).  Candis Dandy, PA-C 12/15/2024      [1]  Allergies Allergen Reactions   Amoxicillin Other (See Comments)    Immediate yeast infection    Nsaids     gastric sleeve   Percocet [Oxycodone -Acetaminophen ] Itching    Tolerates acetaminophen  alone   [name] [Nitrofurantoin Monohyd Macro] Rash and Hives   [name] Rash    Reported chicken pox-like rash, not hives   Sulfa Antibiotics Rash and Hives  [2]  Social History Tobacco Use  Smoking Status Never  Smokeless Tobacco Never   "

## 2024-12-21 NOTE — Telephone Encounter (Signed)
 Received notification that voucher supply of Opsynvi has been shipped to patient on 12/18/2024.

## 2025-01-12 ENCOUNTER — Inpatient Hospital Stay: Attending: Hematology and Oncology

## 2025-01-12 ENCOUNTER — Other Ambulatory Visit: Payer: Self-pay

## 2025-01-12 ENCOUNTER — Inpatient Hospital Stay: Admitting: Hematology and Oncology

## 2025-01-12 VITALS — BP 112/69 | HR 78 | Temp 97.4°F | Resp 17 | Wt 197.7 lb

## 2025-01-12 DIAGNOSIS — D509 Iron deficiency anemia, unspecified: Secondary | ICD-10-CM

## 2025-01-12 DIAGNOSIS — M349 Systemic sclerosis, unspecified: Secondary | ICD-10-CM | POA: Insufficient documentation

## 2025-01-12 DIAGNOSIS — D508 Other iron deficiency anemias: Secondary | ICD-10-CM | POA: Insufficient documentation

## 2025-01-12 DIAGNOSIS — Z9884 Bariatric surgery status: Secondary | ICD-10-CM | POA: Insufficient documentation

## 2025-01-12 LAB — CBC WITH DIFFERENTIAL/PLATELET
Abs Immature Granulocytes: 0.02 K/uL (ref 0.00–0.07)
Basophils Absolute: 0 K/uL (ref 0.0–0.1)
Basophils Relative: 1 %
Eosinophils Absolute: 0.2 K/uL (ref 0.0–0.5)
Eosinophils Relative: 4 %
HCT: 40.1 % (ref 36.0–46.0)
Hemoglobin: 13.3 g/dL (ref 12.0–15.0)
Immature Granulocytes: 0 %
Lymphocytes Relative: 33 %
Lymphs Abs: 2 K/uL (ref 0.7–4.0)
MCH: 29.3 pg (ref 26.0–34.0)
MCHC: 33.2 g/dL (ref 30.0–36.0)
MCV: 88.3 fL (ref 80.0–100.0)
Monocytes Absolute: 0.6 K/uL (ref 0.1–1.0)
Monocytes Relative: 9 %
Neutro Abs: 3.2 K/uL (ref 1.7–7.7)
Neutrophils Relative %: 53 %
Platelets: 255 K/uL (ref 150–400)
RBC: 4.54 MIL/uL (ref 3.87–5.11)
RDW: 16.7 % — ABNORMAL HIGH (ref 11.5–15.5)
WBC: 6 K/uL (ref 4.0–10.5)
nRBC: 0 % (ref 0.0–0.2)

## 2025-01-12 LAB — FERRITIN: Ferritin: 75 ng/mL (ref 11–307)

## 2025-01-12 NOTE — Progress Notes (Signed)
 [facility] CONSULT NOTE  Patient Care Team: Okey Carlin Redbird, MD as PCP - General (Family Medicine)  CHIEF COMPLAINTS/PURPOSE OF CONSULTATION:  IDA  ASSESSMENT & PLAN:   Assessment and Plan Assessment & Plan Iron deficiency anemia in the setting of prior gastric bypass and possible malabsorption Improved post-IV iron therapy with normalized hemoglobin. Risk of recurrence due to impaired absorption from gastric bypass. - Ordered ferritin level to assess iron stores. - Recommended follow-up labs in 6-9 months. - Advised to report increased symptoms or suspicion of recurrent anemia. - Scheduled routine follow-up.  Systemic sclerosis (scleroderma) Well-managed with unchanged symptoms. Fatigue may be partially due to scleroderma. - Continue current management.   HISTORY OF PRESENTING ILLNESS:  Rachael Camacho 59 y.o. female is here because of IDA  Discussed the use of AI scribe software for clinical note transcription with the patient, who gave verbal consent to proceed.  History of Present Illness  Rachael Camacho is a 59 year old female with iron deficiency anemia secondary to impaired absorption following bariatric surgery who presents for hematology follow-up after intravenous iron therapy.  She has iron deficiency anemia, previously with a hemoglobin of 9.9 g/dL prior to intravenous iron administration. Following treatment, her hemoglobin increased to 13.3 g/dL. She reports partial improvement in fatigue and complete resolution of severe leg cramps. She denies pica. She is currently recovering from a recent viral illness but has no new symptoms attributable to anemia.  Her systemic sclerosis remains stable without significant change in symptoms. She continues to experience some fatigue.  She has chronic diarrhea, which she attributes to irritable bowel syndrome. She has a history of gastric bypass.  All other systems were reviewed with the patient and are  negative.  MEDICAL HISTORY:  Past Medical History:  Diagnosis Date   Anemia    as child in first grade   Anxiety    Arthritis    Back pain    Constipation    CREST syndrome (HCC)    Dyspnea    Family history of adverse reaction to anesthesia    mom has had problems in past - N/V post op on one occasion; difficult time waking up on another occasion   Gallbladder problem    GERD (gastroesophageal reflux disease)    Gestational diabetes mellitus in childbirth, diet controlled 1987   IBS (irritable bowel syndrome)    Joint pain    Leg edema    Menopause    Osteoarthritis (arthritis due to wear and tear of joints)    PAH (pulmonary artery hypertension) (HCC)    Pulmonary fibrosis (HCC)    Raynaud disease    hands-toes   Scleroderma (HCC)    Shortness of breath dyspnea    pulmonary fibrosis-scleraderma   Sleep apnea    Swallowing difficulty    Thyroid  cyst    Vitamin D  deficiency    Wears glasses     SURGICAL HISTORY: Past Surgical History:  Procedure Laterality Date   CARDIAC CATHETERIZATION N/A 11/25/2015   Procedure: Right Heart Cath;  Surgeon: Toribio JONELLE Fuel, MD;  Location: [facility] INVASIVE CV LAB;  Service: Cardiovascular;  Laterality: N/A;   CESAREAN SECTION  87,95   CHOLECYSTECTOMY  1996   lap choli   COLONOSCOPY     ESOPHAGOGASTRODUODENOSCOPY (EGD) WITH PROPOFOL  N/A 02/12/2017   Procedure: ESOPHAGOGASTRODUODENOSCOPY (EGD) WITH PROPOFOL ;  Surgeon: Oliva Boots, MD;  Location: [facility] ENDOSCOPY;  Service: Endoscopy;  Laterality: N/A;   HIATAL HERNIA REPAIR  07/26/2015   Procedure: LAPAROSCOPIC  REPAIR OF HIATAL HERNIA;  Surgeon: Donnice Lunger, MD;  Location: [facility] ORS;  Service: General;;   LAPAROSCOPIC GASTRIC SLEEVE RESECTION N/A 07/26/2015   Procedure: LAPAROSCOPIC GASTRIC SLEEVE RESECTION;  Surgeon: Donnice Lunger, MD;  Location: [facility] ORS;  Service: General;  Laterality: N/A;   LEFT AND RIGHT HEART CATHETERIZATION WITH CORONARY ANGIOGRAM N/A 03/14/2015   Procedure: LEFT AND RIGHT  HEART CATHETERIZATION WITH CORONARY ANGIOGRAM;  Surgeon: Peter M Jordan, MD;  Location: [facility] CATH LAB;  Service: Cardiovascular;  Laterality: N/A;   POSTERIOR CERVICAL FUSION/FORAMINOTOMY N/A 01/16/2018   Procedure: Cervical one-two Posterior instrumentation and fusion;  Surgeon: Mavis Purchase, MD;  Location: [facility] OR;  Service: Neurosurgery;  Laterality: N/A;   RADIAL HEAD ARTHROPLASTY Right 07/28/2014   Procedure: RIGHT RADIAL HEAD REPLACEMENT;  Surgeon: Donnice Robinsons, MD;  Location: [facility];  Service: Orthopedics;  Laterality: Right;   RIGHT HEART CATH N/A 03/18/2017   Procedure: Right Heart Cath;  Surgeon: Toribio JONELLE Fuel, MD;  Location: [facility] INVASIVE CV LAB;  Service: Cardiovascular;  Laterality: N/A;   RIGHT HEART CATH N/A 09/27/2020   Procedure: RIGHT HEART CATH;  Surgeon: Fuel Toribio JONELLE, MD;  Location: [facility] INVASIVE CV LAB;  Service: Cardiovascular;  Laterality: N/A;   RIGHT HEART CATH N/A 08/13/2022   Procedure: RIGHT HEART CATH;  Surgeon: Fuel Toribio JONELLE, MD;  Location: [facility] INVASIVE CV LAB;  Service: Cardiovascular;  Laterality: N/A;   RIGHT HEART CATH N/A 07/03/2024   Procedure: RIGHT HEART CATH;  Surgeon: Fuel Toribio JONELLE, MD;  Location: [facility] INVASIVE CV LAB;  Service: Cardiovascular;  Laterality: N/A;   SAVORY DILATION N/A 02/12/2017   Procedure: SAVORY DILATION;  Surgeon: Oliva Boots, MD;  Location: [facility] ENDOSCOPY;  Service: Endoscopy;  Laterality: N/A;   TEE WITHOUT CARDIOVERSION N/A 03/01/2023   Procedure: TRANSESOPHAGEAL ECHOCARDIOGRAM (TEE);  Surgeon: Fuel Toribio JONELLE, MD;  Location: [facility] ENDOSCOPY;  Service: Cardiovascular;  Laterality: N/A;   TUBAL LIGATION     UPPER GI ENDOSCOPY  07/26/2015   Procedure: UPPER GI ENDOSCOPY;  Surgeon: Donnice Lunger, MD;  Location: [facility] ORS;  Service: General;;   VAGINAL HYSTERECTOMY  2010   LAVH    SOCIAL HISTORY: Social History   Socioeconomic History   Marital status: Married    Spouse name: W Dalton Mille   Number of  children: 2   Years of education: Not on file   Highest education level: Not on file  Occupational History   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  Tobacco Use   Smoking status: Never   Smokeless tobacco: Never  Vaping Use   Vaping status: Never Used  Substance and Sexual Activity   Alcohol use: Yes    Alcohol/week: 0.0 standard drinks of alcohol    Comment: seldom   Drug use: No   Sexual activity: Not on file  Other Topics Concern   Not on file  Social History Narrative   Lives at home with her husband   Right handed   Social Drivers of Health   Tobacco Use: Low Risk (01/03/2025)   Received from [facility]   Patient History    Smoking Tobacco Use: Never    Smokeless Tobacco Use: Never    Passive Exposure: Not on file  Financial Resource Strain: Not on file  Food Insecurity: No Food Insecurity (10/12/2024)   Epic    Worried About Programme Researcher, Broadcasting/film/video in the Last Year: Never true    Ran Out of Food in the  Last Year: Never true  Transportation Needs: No Transportation Needs (10/12/2024)   Epic    Lack of Transportation (Medical): No    Lack of Transportation (Non-Medical): No  Physical Activity: Not on file  Stress: Not on file  Social Connections: Unknown (05/05/2022)   Received from [facility]   Social Network    Social Network: Not on file  Intimate Partner Violence: Not At Risk (10/12/2024)   Epic    Fear of Current or Ex-Partner: No    Emotionally Abused: No    Physically Abused: No    Sexually Abused: No  Depression (PHQ2-9): Low Risk (01/12/2025)   Depression (PHQ2-9)    PHQ-2 Score: 0  Alcohol Screen: Not on file  Housing: Low Risk (10/12/2024)   Epic    Unable to Pay for Housing in the Last Year: No    Number of Times Moved in the Last Year: 0    Homeless in the Last Year: No  Utilities: Not At Risk (10/12/2024)   Epic    Threatened with loss of utilities: No  Health Literacy: Not on file    FAMILY  HISTORY: Family History  Problem Relation Age of Onset   Heart failure Mother        Stent put in 2014 for blockage   COPD Mother    Hypertension Mother    Hyperlipidemia Mother    Heart disease Mother    Thyroid  disease Mother    Cancer Mother    Depression Mother    Anxiety disorder Mother    Heart disease Father    Heart disease Son        Repair ASD   Neuropathy Neg Hx     ALLERGIES:  is allergic to amoxicillin, nsaids, percocet [oxycodone -acetaminophen ], macrobid [nitrofurantoin monohyd macro], minocycline, and sulfa antibiotics.  MEDICATIONS:  Current Outpatient Medications  Medication Sig Dispense Refill   Azelastine HCl 137 MCG/SPRAY SOLN Place 1 spray into the nose 2 (two) times daily as needed (allergies).     baclofen  (LIORESAL ) 10 MG tablet Take 0.5 tablets (5 mg total) by mouth daily. 45 each 3   buPROPion  (WELLBUTRIN  XL) 150 MG 24 hr tablet Take 150 mg by mouth daily.     calcium carbonate (TUMS - DOSED IN MG ELEMENTAL CALCIUM) 500 MG chewable tablet Chew 2 tablets by mouth daily as needed for indigestion or heartburn.     desloratadine (CLARINEX) 5 MG tablet Take 1 tablet by mouth daily.     diclofenac sodium (VOLTAREN) 1 % GEL Apply 2-4 g topically 4 (four) times daily as needed (for knee pain.).     diphenhydramine -acetaminophen  (TYLENOL  PM) 25-500 MG TABS tablet Take 1 tablet by mouth at bedtime as needed (sleep).     furosemide  (LASIX ) 40 MG tablet Take 40 mg by mouth daily.     gabapentin  (NEURONTIN ) 100 MG capsule TAKE 1 CAPSULE BY MOUTH AT BEDTIME. 90 capsule 3   levothyroxine  (SYNTHROID ) 50 MCG tablet Take 50 mcg by mouth daily before breakfast.     Macitentan -Tadalafil  (OPSYNVI PO) Take 1 tablet by mouth daily.     omeprazole (PRILOSEC) 40 MG capsule Take 40 mg by mouth 2 (two) times daily.     TRINTELLIX 5 MG TABS tablet Take 5 mg by mouth daily.     macitentan  (OPSUMIT ) 10 MG tablet TAKE 1 TABLET BY MOUTH 1 TIME A DAY. DO NOT HANDLE IF PREGNANT. DO NOT  SPLIT, CRUSH, OR CHEW. (Patient not taking: Reported on 01/12/2025) 30 tablet  11   tadalafil  (CIALIS ) 20 MG tablet Take 2 tablets (40 mg total) by mouth daily. (Patient not taking: Reported on 01/12/2025) 60 tablet 2   No current facility-administered medications for this visit.     PHYSICAL EXAMINATION: ECOG PERFORMANCE STATUS: 1 - Symptomatic but completely ambulatory  Vitals:   01/12/25 0914  BP: 112/69  Pulse: 78  Resp: 17  Temp: (!) 97.4 F (36.3 C)  SpO2: 95%   Filed Weights   01/12/25 0914  Weight: 197 lb 11.2 oz (89.7 kg)    GENERAL:alert, no distress and comfortable SKIN: skin color, texture, turgor are normal, no rashes or significant lesions EYES: normal, conjunctiva are pink and non-injected, sclera clear OROPHARYNX:no exudate, no erythema and lips, buccal mucosa, and tongue normal  NECK: supple, thyroid  normal size, non-tender, without nodularity LYMPH:  no palpable lymphadenopathy in the cervical, axillary LUNGS: clear to auscultation and percussion with normal breathing effort HEART: regular rate & rhythm and no murmurs and no lower extremity edema ABDOMEN:abdomen soft, non-tender and normal bowel sounds Musculoskeletal:no cyanosis of digits and no clubbing  PSYCH: alert & oriented x 3 with fluent speech NEURO: no focal motor/sensory deficits  LABORATORY DATA:  I have reviewed the data as listed Lab Results  Component Value Date   WBC 6.0 01/12/2025   HGB 13.3 01/12/2025   HCT 40.1 01/12/2025   MCV 88.3 01/12/2025   PLT 255 01/12/2025     Chemistry      Component Value Date/Time   NA 146 (H) 07/03/2024 0832   NA 142 05/12/2020 1221   K 3.2 (L) 07/03/2024 0832   CL 106 06/15/2024 1606   CO2 21 (L) 06/15/2024 1606   BUN 16 06/15/2024 1606   BUN 15 05/12/2020 1221   CREATININE 1.09 (H) 06/15/2024 1606   CREATININE 0.75 03/08/2015 1501   GLU 79 10/03/2018 0000      Component Value Date/Time   CALCIUM 8.9 06/15/2024 1606   ALKPHOS 86 08/08/2022  1612   AST 22 08/08/2022 1612   ALT 16 08/08/2022 1612   BILITOT 0.2 (L) 08/08/2022 1612   BILITOT 0.6 05/12/2020 1221       RADIOGRAPHIC STUDIES: I have personally reviewed the radiological images as listed and agreed with the findings in the report. No results found.  All questions were answered. The patient knows to call the clinic with any problems, questions or concerns. I spent 20 minutes in the care of this patient including H and P, review of records, counseling and coordination of care.     Amber Stalls, MD 01/12/2025 9:24 AM

## 2025-01-13 ENCOUNTER — Ambulatory Visit: Payer: Self-pay | Admitting: Hematology and Oncology

## 2025-02-15 ENCOUNTER — Ambulatory Visit (HOSPITAL_BASED_OUTPATIENT_CLINIC_OR_DEPARTMENT_OTHER)

## 2025-02-17 ENCOUNTER — Ambulatory Visit: Admitting: Orthopedic Surgery

## 2025-07-13 ENCOUNTER — Inpatient Hospital Stay

## 2025-07-13 ENCOUNTER — Inpatient Hospital Stay: Admitting: Hematology and Oncology

## 2025-09-09 ENCOUNTER — Telehealth: Admitting: Adult Health
